# Patient Record
Sex: Male | Born: 1935 | Race: White | Hispanic: No | Marital: Married | Smoking: Never smoker
Health system: Southern US, Community
[De-identification: ages and names within clinical notes are randomized; demographics above are authoritative.]

## PROBLEM LIST (undated history)

## (undated) DIAGNOSIS — I10 Essential (primary) hypertension: Secondary | ICD-10-CM

## (undated) DIAGNOSIS — Z972 Presence of dental prosthetic device (complete) (partial): Secondary | ICD-10-CM

## (undated) DIAGNOSIS — R011 Cardiac murmur, unspecified: Secondary | ICD-10-CM

## (undated) DIAGNOSIS — I2510 Atherosclerotic heart disease of native coronary artery without angina pectoris: Secondary | ICD-10-CM

## (undated) DIAGNOSIS — E7800 Pure hypercholesterolemia, unspecified: Secondary | ICD-10-CM

## (undated) DIAGNOSIS — M109 Gout, unspecified: Secondary | ICD-10-CM

## (undated) DIAGNOSIS — H269 Unspecified cataract: Secondary | ICD-10-CM

## (undated) DIAGNOSIS — M1990 Unspecified osteoarthritis, unspecified site: Secondary | ICD-10-CM

## (undated) DIAGNOSIS — J45909 Unspecified asthma, uncomplicated: Secondary | ICD-10-CM

## (undated) DIAGNOSIS — I5020 Unspecified systolic (congestive) heart failure: Secondary | ICD-10-CM

## (undated) DIAGNOSIS — Z8619 Personal history of other infectious and parasitic diseases: Secondary | ICD-10-CM

## (undated) DIAGNOSIS — I350 Nonrheumatic aortic (valve) stenosis: Secondary | ICD-10-CM

## (undated) DIAGNOSIS — N400 Enlarged prostate without lower urinary tract symptoms: Secondary | ICD-10-CM

## (undated) HISTORY — DX: Unspecified cataract: H26.9

## (undated) HISTORY — DX: Pure hypercholesterolemia, unspecified: E78.00

## (undated) HISTORY — DX: Essential (primary) hypertension: I10

## (undated) HISTORY — DX: Unspecified systolic (congestive) heart failure: I50.20

## (undated) HISTORY — PX: DENTAL SURGERY: SHX609

## (undated) HISTORY — DX: Gout, unspecified: M10.9

## (undated) HISTORY — DX: Personal history of other infectious and parasitic diseases: Z86.19

## (undated) HISTORY — PX: CARDIAC CATHETERIZATION: SHX172

## (undated) HISTORY — DX: Unspecified asthma, uncomplicated: J45.909

## (undated) HISTORY — DX: Atherosclerotic heart disease of native coronary artery without angina pectoris: I25.10

## (undated) HISTORY — PX: FRACTURE SURGERY: SHX138

## (undated) HISTORY — DX: Benign prostatic hyperplasia without lower urinary tract symptoms: N40.0

---

## 2004-07-15 ENCOUNTER — Ambulatory Visit: Payer: Self-pay | Admitting: Unknown Physician Specialty

## 2004-09-25 ENCOUNTER — Ambulatory Visit: Payer: Self-pay | Admitting: Internal Medicine

## 2007-03-15 ENCOUNTER — Ambulatory Visit: Payer: Self-pay | Admitting: Unknown Physician Specialty

## 2010-08-26 ENCOUNTER — Ambulatory Visit: Payer: Self-pay | Admitting: Unknown Physician Specialty

## 2010-08-26 LAB — HM COLONOSCOPY: HM Colonoscopy: NORMAL

## 2012-05-02 ENCOUNTER — Ambulatory Visit (INDEPENDENT_AMBULATORY_CARE_PROVIDER_SITE_OTHER): Payer: Medicare Other | Admitting: Internal Medicine

## 2012-05-02 ENCOUNTER — Encounter: Payer: Self-pay | Admitting: Internal Medicine

## 2012-05-02 VITALS — BP 140/78 | HR 63 | Temp 97.5°F | Ht 71.0 in | Wt 239.5 lb

## 2012-05-02 DIAGNOSIS — E785 Hyperlipidemia, unspecified: Secondary | ICD-10-CM | POA: Insufficient documentation

## 2012-05-02 DIAGNOSIS — E119 Type 2 diabetes mellitus without complications: Secondary | ICD-10-CM | POA: Insufficient documentation

## 2012-05-02 DIAGNOSIS — R739 Hyperglycemia, unspecified: Secondary | ICD-10-CM

## 2012-05-02 DIAGNOSIS — M109 Gout, unspecified: Secondary | ICD-10-CM

## 2012-05-02 DIAGNOSIS — Z23 Encounter for immunization: Secondary | ICD-10-CM

## 2012-05-02 DIAGNOSIS — R7309 Other abnormal glucose: Secondary | ICD-10-CM

## 2012-05-02 DIAGNOSIS — E7800 Pure hypercholesterolemia, unspecified: Secondary | ICD-10-CM

## 2012-05-02 DIAGNOSIS — N289 Disorder of kidney and ureter, unspecified: Secondary | ICD-10-CM

## 2012-05-02 DIAGNOSIS — I10 Essential (primary) hypertension: Secondary | ICD-10-CM | POA: Insufficient documentation

## 2012-05-02 NOTE — Patient Instructions (Addendum)
It was nice seeing you today.  I am glad you have been doing well.  We will schedule your next appt with follow up labs.

## 2012-05-17 ENCOUNTER — Telehealth: Payer: Self-pay | Admitting: Internal Medicine

## 2012-05-17 MED ORDER — COLCHICINE 0.6 MG PO TABS: 0.6000 mg | ORAL_TABLET | Freq: Two times a day (BID) | ORAL | Status: DC | PRN

## 2012-05-17 NOTE — Telephone Encounter (Signed)
Patient Information:  Caller Name: Akul  Phone: [phone]  Patient: [name] [name], [name] [name]  Gender: Male  DOB: 06-Jan-1931  Age: 78 Years  PCP: Dale Colstrip   Symptoms  Reason For Call & Symptoms: gout; takes allopurinol but having a flare.  States in the past he has taken cholchicine for flares in the past, but his prescription has long expired.  Reviewed Health History In EMR: Yes  Reviewed Medications In EMR: Yes  Reviewed Allergies In EMR: Yes  Reviewed Surgeries / Procedures: Yes  Date of Onset of Symptoms: 05/04/2012  Guideline(s) Used:  Foot Pain  Disposition Per Guideline:   See Within 3 Days in Office  Reason For Disposition Reached:   Pain in the big toe joint  Advice Given:  N/A  Office Follow Up:  Does the office need to follow up with this patient?: Yes  Instructions For The Office: gout flare; info to office for staff management of appt need krs/can  RN Note:  R great toe reddened and painful; has been getting worse since flare started 10 days or so ago.  Afebrile.  States the cholchecine sometimes gives him diarrhea and wonders if there is another option for pain?  Per protocol, advised being seen within 72 hours; no available appts in specified time frame.  Info to office for provider review/Rx/callback.  Uses [facility] [facility].  May reach patient at [phone].

## 2012-05-17 NOTE — Telephone Encounter (Signed)
Spoke to pt.  Has never had problems with taking colchicine previously.  Usually does not get diarrhea.  Sent in rx for colchicine .6mg  bid prn.  Pt instructed on proper way to take.  If problems, call.

## 2012-05-21 ENCOUNTER — Encounter: Payer: Self-pay | Admitting: Internal Medicine

## 2012-05-21 DIAGNOSIS — N183 Chronic kidney disease, stage 3 unspecified: Secondary | ICD-10-CM | POA: Insufficient documentation

## 2012-05-21 DIAGNOSIS — M109 Gout, unspecified: Secondary | ICD-10-CM | POA: Insufficient documentation

## 2012-05-21 DIAGNOSIS — N289 Disorder of kidney and ureter, unspecified: Secondary | ICD-10-CM | POA: Insufficient documentation

## 2012-05-21 MED ORDER — METOPROLOL SUCCINATE ER 50 MG PO TB24: 50.0000 mg | ORAL_TABLET | Freq: Two times a day (BID) | ORAL | Status: DC

## 2012-05-21 MED ORDER — ROSUVASTATIN CALCIUM 5 MG PO TABS: 5.0000 mg | ORAL_TABLET | Freq: Every day | ORAL | Status: DC

## 2012-05-21 MED ORDER — ALLOPURINOL 100 MG PO TABS: 100.0000 mg | ORAL_TABLET | Freq: Every day | ORAL | Status: DC

## 2012-05-21 MED ORDER — HYDROCHLOROTHIAZIDE 12.5 MG PO CAPS: 12.5000 mg | ORAL_CAPSULE | Freq: Every day | ORAL | Status: DC

## 2012-05-21 MED ORDER — AMLODIPINE BESYLATE 5 MG PO TABS: 5.0000 mg | ORAL_TABLET | Freq: Every day | ORAL | Status: DC

## 2012-05-21 MED ORDER — LISINOPRIL 40 MG PO TABS: 40.0000 mg | ORAL_TABLET | Freq: Every day | ORAL | Status: DC

## 2012-05-21 NOTE — Assessment & Plan Note (Signed)
On Allopurinol.  Follow.   

## 2012-05-21 NOTE — Assessment & Plan Note (Signed)
Continue Crestor.  Low cholesterol diet and exercise.  Check lipid panel and liver function with next fasting labs.

## 2012-05-21 NOTE — Assessment & Plan Note (Signed)
Low carb diet and exercise.  Follow sugars.  Check met b and a1c.

## 2012-05-21 NOTE — Progress Notes (Signed)
Subjective:    Patient ID: [name] [name], male    DOB: 1930-11-05, 78 y.o.   MRN: [id number]  HPI 78 year old male with past history of hypertension, hypercholesterolemia, hyperglycemia, BPH and gout who comes in today for a scheduled follow up.  States he has been doing well.  No chest pain or tightness.  Breathing stable.  Saw Dr Lady Gary for the murmur.  Recommended just following.  Brought in no sugar readings.  Trying to stay active.  Bowels stable.   Past Medical History  Diagnosis Date  . Hypertension   . BPH (benign prostatic hypertrophy)   . Diabetes mellitus   . Hypercholesterolemia   . Gout   . Asthma   . History of chicken pox     Outpatient Encounter Prescriptions as of 05/02/2012  Medication Sig Dispense Refill  . allopurinol (ZYLOPRIM) 100 MG tablet Take 100 mg by mouth daily.      Marland Kitchen amLODipine (NORVASC) 5 MG tablet Take 5 mg by mouth daily.      Marland Kitchen aspirin 81 MG tablet Take 81 mg by mouth daily.      . hydrochlorothiazide (MICROZIDE) 12.5 MG capsule Take 12.5 mg by mouth daily.      Marland Kitchen lisinopril (PRINIVIL,ZESTRIL) 40 MG tablet Take 40 mg by mouth daily.      Marland Kitchen LORazepam (ATIVAN) 1 MG tablet 1 mg. Takes 1/2 to 1 tablet q day prn      . metoprolol succinate (TOPROL-XL) 50 MG 24 hr tablet Take 50 mg by mouth 2 (two) times daily.      . rosuvastatin (CRESTOR) 5 MG tablet Take 5 mg by mouth daily.      . [DISCONTINUED] hydrochlorothiazide (HYDRODIURIL) 25 MG tablet Take 25 mg by mouth daily.      . [DISCONTINUED] hydrochlorothiazide (HYDRODIURIL) 25 MG tablet Take 25 mg by mouth daily.        Review of Systems Patient denies any headache, lightheadedness or dizziness.  No sinus or allergy symptoms.   No chest pain, tightness or palpitations.  No increased shortness of breath, cough or congestion.  No nausea or vomiting.  No abdominal pain or cramping.  No bowel change, such as diarrhea, constipation, BRBPR or melana.  No urine change.  Overall he feels he is doing well.       Objective:   Physical Exam Filed Vitals:   05/02/12 0851  BP: 140/78  Pulse: 63  Temp: 97.5 F (51.42 C)   78 year old male in no acute distress.   HEENT:  Nares - clear.  OP- without lesions or erythema.  NECK:  Supple, nontender.  No audible bruit.   HEART:  Appears to be regular. LUNGS:  Without crackles or wheezing audible.  Respirations even and unlabored.   RADIAL PULSE:  Equal bilaterally.  ABDOMEN:  Soft, nontender.  No audible abdominal bruit.   EXTREMITIES:  No increased edema to be present.                     Assessment & Plan:  INCREASED PSYCHOSOCIAL STRESSORS.  Takes an occasional Lorazepam.  Currently feels he is doing well.  Follow.   HISTORY OF ELEVATED PSA.  Saw Dr Achilles Dunk.  PSA 08/02/11 - 3.90 (stable).  Follow.    CARDIOVASCULAR.  Saw Dr Lady Gary.  Recommended to continue to follow.  Need Dr America Brown last note and last ECHO.    PULMONARY.  Asymptomatic.    HEALTH MAINTENANCE.  Physical 07/15/11.  PSA  as outlined.  Previous colonoscopy revealed multiple adenomatous polyps.  Follow up colonoscopy 08/26/10 revealed internal hemorrhoids otherwise negative.  Per GI, no follow up colonoscopy needed.  Pneumovax 07/31/08.

## 2012-05-21 NOTE — Assessment & Plan Note (Signed)
Creatinine has been stable 1.2-1.3.  Follow.  Continue lisinopril.    

## 2012-05-21 NOTE — Assessment & Plan Note (Signed)
Blood pressure as outlined.  Same meds.  Check metabolic panel.  

## 2012-07-28 ENCOUNTER — Telehealth: Payer: Self-pay | Admitting: Internal Medicine

## 2012-07-28 ENCOUNTER — Other Ambulatory Visit (INDEPENDENT_AMBULATORY_CARE_PROVIDER_SITE_OTHER): Payer: Medicare Other

## 2012-07-28 DIAGNOSIS — I10 Essential (primary) hypertension: Secondary | ICD-10-CM

## 2012-07-28 DIAGNOSIS — R739 Hyperglycemia, unspecified: Secondary | ICD-10-CM

## 2012-07-28 DIAGNOSIS — R7309 Other abnormal glucose: Secondary | ICD-10-CM

## 2012-07-28 DIAGNOSIS — E7800 Pure hypercholesterolemia, unspecified: Secondary | ICD-10-CM

## 2012-07-28 LAB — LIPID PANEL
Cholesterol: 119 mg/dL (ref 0–200)
LDL Cholesterol: 71 mg/dL (ref 0–99)
Triglycerides: 71 mg/dL (ref 0.0–149.0)
VLDL: 14.2 mg/dL (ref 0.0–40.0)

## 2012-07-28 LAB — HEPATIC FUNCTION PANEL
ALT: 23 U/L (ref 0–53)
Albumin: 4.5 g/dL (ref 3.5–5.2)
Alkaline Phosphatase: 53 U/L (ref 39–117)
Total Protein: 7.3 g/dL (ref 6.0–8.3)

## 2012-07-28 LAB — BASIC METABOLIC PANEL
Calcium: 9.8 mg/dL (ref 8.4–10.5)
GFR: 60.18 mL/min (ref >60.00)
Potassium: 4.3 mEq/L (ref 3.5–5.1)
Sodium: 138 mEq/L (ref 135–145)

## 2012-07-28 LAB — HEMOGLOBIN A1C: Hgb A1c MFr Bld: 6.2 % (ref 4.6–6.5)

## 2012-07-28 NOTE — Telephone Encounter (Signed)
Pt notified of lab results via mychart. 

## 2012-08-04 ENCOUNTER — Encounter: Payer: Medicare Other | Admitting: Internal Medicine

## 2012-08-29 LAB — HM DIABETES EYE EXAM

## 2012-09-29 ENCOUNTER — Encounter: Payer: Self-pay | Admitting: Internal Medicine

## 2012-09-29 ENCOUNTER — Ambulatory Visit (INDEPENDENT_AMBULATORY_CARE_PROVIDER_SITE_OTHER): Payer: Medicare Other | Admitting: Internal Medicine

## 2012-09-29 VITALS — BP 132/72 | HR 60 | Temp 97.6°F | Resp 18 | Ht 71.0 in | Wt 243.5 lb

## 2012-09-29 DIAGNOSIS — E7800 Pure hypercholesterolemia, unspecified: Secondary | ICD-10-CM

## 2012-09-29 DIAGNOSIS — N289 Disorder of kidney and ureter, unspecified: Secondary | ICD-10-CM

## 2012-09-29 DIAGNOSIS — I10 Essential (primary) hypertension: Secondary | ICD-10-CM

## 2012-09-29 DIAGNOSIS — R739 Hyperglycemia, unspecified: Secondary | ICD-10-CM

## 2012-09-29 DIAGNOSIS — M109 Gout, unspecified: Secondary | ICD-10-CM

## 2012-09-29 DIAGNOSIS — R7309 Other abnormal glucose: Secondary | ICD-10-CM

## 2012-09-29 MED ORDER — LORAZEPAM 1 MG PO TABS: ORAL_TABLET | ORAL | Status: DC

## 2012-10-01 ENCOUNTER — Encounter: Payer: Self-pay | Admitting: Internal Medicine

## 2012-10-01 NOTE — Assessment & Plan Note (Signed)
Creatinine has been stable 1.2-1.3.  Follow.  Continue lisinopril.    

## 2012-10-01 NOTE — Assessment & Plan Note (Signed)
On Allopurinol.  Follow.   

## 2012-10-01 NOTE — Assessment & Plan Note (Signed)
Continue Crestor.  Low cholesterol diet and exercise.  Check lipid panel and liver function with next fasting labs.  Last lipid panel 07/28/12 revealed total cholesterol 119, triglycerides 71, HDL 34 and LDL 71.

## 2012-10-01 NOTE — Assessment & Plan Note (Signed)
Low carb diet and exercise.  Follow sugars.  Check met b and a1c with next fasting labs.

## 2012-10-01 NOTE — Assessment & Plan Note (Signed)
Blood pressure as outlined.  Same meds.  Check metabolic panel with next labs.

## 2012-10-01 NOTE — Progress Notes (Signed)
Subjective:    Patient ID: [name] [name], male    DOB: [birth year], 79 y.o.   MRN: [id number]  HPI 79 year old male with past history of hypertension, hypercholesterolemia, hyperglycemia, BPH and gout who comes in today to follow up on these issues as well as for a complete physical exam. States he has been doing well.  No chest pain or tightness.  Breathing stable.  Saw Dr Lady Gary for the murmur.  Recommended just following.  Brought in no sugar readings.  Trying to stay active.  Bowels stable.  Plans to be more active now that it is warmer weather.  Overall he feels good.    Past Medical History  Diagnosis Date  . Hypertension   . BPH (benign prostatic hypertrophy)   . Diabetes mellitus   . Hypercholesterolemia   . Gout   . Asthma   . History of chicken pox     Outpatient Encounter Prescriptions as of 09/29/2012  Medication Sig Dispense Refill  . allopurinol (ZYLOPRIM) 100 MG tablet Take 1 tablet (100 mg total) by mouth daily.  90 tablet  3  . amLODipine (NORVASC) 5 MG tablet Take 1 tablet (5 mg total) by mouth daily.  90 tablet  3  . aspirin 81 MG tablet Take 81 mg by mouth daily.      . colchicine 0.6 MG tablet Take 1 tablet (0.6 mg total) by mouth 2 (two) times daily as needed.  30 tablet  0  . hydrochlorothiazide (MICROZIDE) 12.5 MG capsule Take 1 capsule (12.5 mg total) by mouth daily.  90 capsule  3  . lisinopril (PRINIVIL,ZESTRIL) 40 MG tablet Take 1 tablet (40 mg total) by mouth daily.  90 tablet  3  . LORazepam (ATIVAN) 1 MG tablet Takes 1/2 to 1 tablet q day prn  30 tablet  1  . metoprolol succinate (TOPROL-XL) 50 MG 24 hr tablet Take 1 tablet (50 mg total) by mouth 2 (two) times daily.  180 tablet  3  . rosuvastatin (CRESTOR) 5 MG tablet Take 1 tablet (5 mg total) by mouth daily.  90 tablet  3  . [DISCONTINUED] LORazepam (ATIVAN) 1 MG tablet 1 mg. Takes 1/2 to 1 tablet q day prn       No facility-administered encounter medications on file as of 09/29/2012.    Review of  Systems Patient denies any headache, lightheadedness or dizziness.  No sinus or allergy symptoms.   No chest pain, tightness or palpitations.  No increased shortness of breath, cough or congestion.  No nausea or vomiting.  No acid reflux.  No abdominal pain or cramping.  No bowel change, such as diarrhea, constipation, BRBPR or melana.  No urine change.  Overall he feels he is doing well.       Objective:   Physical Exam  Filed Vitals:   09/29/12 0944  BP: 132/72  Pulse: 60  Temp: 97.6 F (36.4 C)  Resp: 31   79 year old male in no acute distress.  HEENT:  Nares - clear.  Oropharynx - without lesions. NECK:  Supple.  Nontender.  No audible carotid bruit.  HEART:  Appears to be regular.   LUNGS:  No crackles or wheezing audible.  Respirations even and unlabored.   RADIAL PULSE:  Equal bilaterally.  ABDOMEN:  Soft.  Nontender.  Bowel sounds present and normal.  No audible abdominal bruit.  GU:  He declined.    RECTAL:  He declined.    EXTREMITIES:  No increased edema present.  DP pulses palpable and equal bilaterally.     SKIN:  No lesions.  No rash.       Assessment & Plan:  INCREASED PSYCHOSOCIAL STRESSORS.  Takes an occasional Lorazepam.  Currently feels he is doing well.  Follow.   HISTORY OF ELEVATED PSA.  Saw Dr Achilles Dunk.  PSA 08/02/11 - 3.90 (stable).  Discussed follow up PSA check.  He declines.  States he desires no further testing.    CARDIOVASCULAR.  Saw Dr Lady Gary.  Recommended to continue to follow.  Currently symptoms stable.    PULMONARY.  Asymptomatic.    HEALTH MAINTENANCE.  Physical today.  PSA as outlined.  Declines further PSA checks.  Previous colonoscopy revealed multiple adenomatous polyps.  Follow up colonoscopy 08/26/10 revealed internal hemorrhoids otherwise negative.  Per GI, no follow up colonoscopy needed.  He declines further hemoccult testing.  Pneumovax 07/31/08.

## 2012-12-25 ENCOUNTER — Other Ambulatory Visit (INDEPENDENT_AMBULATORY_CARE_PROVIDER_SITE_OTHER): Payer: Medicare Other

## 2012-12-25 DIAGNOSIS — R7309 Other abnormal glucose: Secondary | ICD-10-CM

## 2012-12-25 DIAGNOSIS — E7800 Pure hypercholesterolemia, unspecified: Secondary | ICD-10-CM

## 2012-12-25 DIAGNOSIS — R739 Hyperglycemia, unspecified: Secondary | ICD-10-CM

## 2012-12-25 DIAGNOSIS — N289 Disorder of kidney and ureter, unspecified: Secondary | ICD-10-CM

## 2012-12-25 DIAGNOSIS — I10 Essential (primary) hypertension: Secondary | ICD-10-CM

## 2012-12-25 LAB — CBC WITH DIFFERENTIAL/PLATELET
Basophils Relative: 0.9 % (ref 0.0–3.0)
Eosinophils Relative: 5.7 % — ABNORMAL HIGH (ref 0.0–5.0)
Hemoglobin: 15.1 g/dL (ref 13.0–17.0)
Lymphocytes Relative: 45.5 % (ref 12.0–46.0)
Monocytes Relative: 8.1 % (ref 3.0–12.0)
Neutro Abs: 3.5 10*3/uL (ref 1.4–7.7)
RBC: 4.54 Mil/uL (ref 4.22–5.81)
WBC: 8.8 10*3/uL (ref 4.5–10.5)

## 2012-12-25 LAB — BASIC METABOLIC PANEL
Calcium: 9.6 mg/dL (ref 8.4–10.5)
Chloride: 106 mEq/L (ref 96–112)
GFR: 55.41 mL/min — ABNORMAL LOW (ref >60.00)
Glucose, Bld: 127 mg/dL — ABNORMAL HIGH (ref 70–99)
Potassium: 4 mEq/L (ref 3.5–5.1)

## 2012-12-25 LAB — HEPATIC FUNCTION PANEL
ALT: 22 U/L (ref 0–53)
AST: 26 U/L (ref 0–37)
Total Bilirubin: 0.7 mg/dL (ref 0.3–1.2)
Total Protein: 7 g/dL (ref 6.0–8.3)

## 2012-12-25 LAB — TSH: TSH: 3.16 u[IU]/mL (ref 0.35–5.50)

## 2012-12-25 LAB — MICROALBUMIN / CREATININE URINE RATIO: Microalb Creat Ratio: 4.1 mg/g (ref 0.0–30.0)

## 2012-12-25 LAB — LIPID PANEL
Cholesterol: 115 mg/dL (ref 0–200)
HDL: 33.1 mg/dL — ABNORMAL LOW (ref >39.00)

## 2012-12-26 ENCOUNTER — Encounter: Payer: Self-pay | Admitting: Internal Medicine

## 2012-12-29 ENCOUNTER — Encounter: Payer: Self-pay | Admitting: Internal Medicine

## 2012-12-29 ENCOUNTER — Ambulatory Visit (INDEPENDENT_AMBULATORY_CARE_PROVIDER_SITE_OTHER): Payer: Medicare Other | Admitting: Internal Medicine

## 2012-12-29 VITALS — BP 110/70 | HR 64 | Temp 97.9°F | Ht 71.0 in | Wt 240.5 lb

## 2012-12-29 DIAGNOSIS — E7800 Pure hypercholesterolemia, unspecified: Secondary | ICD-10-CM

## 2012-12-29 DIAGNOSIS — N289 Disorder of kidney and ureter, unspecified: Secondary | ICD-10-CM

## 2012-12-29 DIAGNOSIS — R739 Hyperglycemia, unspecified: Secondary | ICD-10-CM

## 2012-12-29 DIAGNOSIS — M109 Gout, unspecified: Secondary | ICD-10-CM

## 2012-12-29 DIAGNOSIS — I10 Essential (primary) hypertension: Secondary | ICD-10-CM

## 2012-12-29 DIAGNOSIS — R7309 Other abnormal glucose: Secondary | ICD-10-CM

## 2012-12-31 ENCOUNTER — Encounter: Payer: Self-pay | Admitting: Internal Medicine

## 2012-12-31 NOTE — Assessment & Plan Note (Signed)
Low carb diet and exercise.  Follow sugars.  Follow met b and a1c.  Last a1c 12/25/12 - 6.6.

## 2012-12-31 NOTE — Progress Notes (Signed)
Subjective:    Patient ID: [name] [name], male    DOB: 1931-01-17, 79 y.o.   MRN: [id number]  HPI 79 year old male with past history of hypertension, hypercholesterolemia, hyperglycemia, BPH and gout who comes in today for a scheduled follow up.  States he has been doing well.  No chest pain or tightness.  Breathing stable.  Saw Dr Lady Gary for the murmur.  Recommended just following.  Brought in no sugar readings.  Trying to stay active.  Bowels stable.  Overall he feels good and feels he is doing well.      Past Medical History  Diagnosis Date  . Hypertension   . BPH (benign prostatic hypertrophy)   . Diabetes mellitus   . Hypercholesterolemia   . Gout   . Asthma   . History of chicken pox     Outpatient Encounter Prescriptions as of 12/29/2012  Medication Sig Dispense Refill  . allopurinol (ZYLOPRIM) 100 MG tablet Take 1 tablet (100 mg total) by mouth daily.  90 tablet  3  . amLODipine (NORVASC) 5 MG tablet Take 1 tablet (5 mg total) by mouth daily.  90 tablet  3  . aspirin 81 MG tablet Take 81 mg by mouth daily.      . colchicine 0.6 MG tablet Take 1 tablet (0.6 mg total) by mouth 2 (two) times daily as needed.  30 tablet  0  . hydrochlorothiazide (MICROZIDE) 12.5 MG capsule Take 1 capsule (12.5 mg total) by mouth daily.  90 capsule  3  . lisinopril (PRINIVIL,ZESTRIL) 40 MG tablet Take 1 tablet (40 mg total) by mouth daily.  90 tablet  3  . LORazepam (ATIVAN) 1 MG tablet Takes 1/2 to 1 tablet q day prn  30 tablet  1  . metoprolol succinate (TOPROL-XL) 50 MG 24 hr tablet Take 1 tablet (50 mg total) by mouth 2 (two) times daily.  180 tablet  3  . rosuvastatin (CRESTOR) 5 MG tablet Take 1 tablet (5 mg total) by mouth daily.  90 tablet  3   No facility-administered encounter medications on file as of 12/29/2012.    Review of Systems Patient denies any headache, lightheadedness or dizziness.  No sinus or allergy symptoms.   No chest pain, tightness or palpitations.  No increased  shortness of breath, cough or congestion.  No nausea or vomiting.  No acid reflux.  No abdominal pain or cramping.  No bowel change, such as diarrhea, constipation, BRBPR or melana.  No urine change.  Overall he feels he is doing well.       Objective:   Physical Exam  Filed Vitals:   12/29/12 0814  BP: 110/70  Pulse: 64  Temp: 97.9 F (19.41 C)   79 year old male in no acute distress.  HEENT:  Nares - clear.  Oropharynx - without lesions. NECK:  Supple.  Nontender.  No audible carotid bruit.  HEART:  Appears to be regular.   LUNGS:  No crackles or wheezing audible.  Respirations even and unlabored.   RADIAL PULSE:  Equal bilaterally.  ABDOMEN:  Soft.  Nontender.  Bowel sounds present and normal.  No audible abdominal bruit.     EXTREMITIES:  No increased edema present.  DP pulses palpable and equal bilaterally.        Assessment & Plan:  INCREASED PSYCHOSOCIAL STRESSORS.  Takes an occasional Lorazepam.  Currently feels he is doing well.  Follow.   HISTORY OF ELEVATED PSA.  Saw Dr Achilles Dunk.  PSA 08/02/11 -  3.90 (stable).  Discussed follow up PSA check.  He declines.  States he desires no further testing.    CARDIOVASCULAR.  Saw Dr Lady Gary.  Recommended to continue to follow.  Currently symptoms stable.  Very active with no cardiac symptoms.    PULMONARY.  Asymptomatic.    HEALTH MAINTENANCE.  Physical 09/29/12.  PSA as outlined.  Declines further PSA checks.  Previous colonoscopy revealed multiple adenomatous polyps.  Follow up colonoscopy 08/26/10 revealed internal hemorrhoids otherwise negative.  Per GI, no follow up colonoscopy needed.  He declines further hemoccult testing.

## 2012-12-31 NOTE — Assessment & Plan Note (Signed)
Continue Crestor.  Low cholesterol diet and exercise.  Check lipid panel and liver function with next fasting labs.  Last lipid panel 12/25/12 revealed total cholesterol 115, triglycerides 66, HDL 33 and LDL 69.

## 2012-12-31 NOTE — Assessment & Plan Note (Signed)
Creatinine has been stable 1.2-1.3.  Follow.  Continue lisinopril.    

## 2012-12-31 NOTE — Assessment & Plan Note (Signed)
On Allopurinol.  Follow.   

## 2012-12-31 NOTE — Assessment & Plan Note (Signed)
Blood pressure as outlined.  Same meds.  Follow metabolic panel.   

## 2013-04-24 ENCOUNTER — Other Ambulatory Visit (INDEPENDENT_AMBULATORY_CARE_PROVIDER_SITE_OTHER): Payer: Medicare Other

## 2013-04-24 DIAGNOSIS — R7309 Other abnormal glucose: Secondary | ICD-10-CM

## 2013-04-24 DIAGNOSIS — R739 Hyperglycemia, unspecified: Secondary | ICD-10-CM

## 2013-04-24 DIAGNOSIS — E7800 Pure hypercholesterolemia, unspecified: Secondary | ICD-10-CM

## 2013-04-24 DIAGNOSIS — I10 Essential (primary) hypertension: Secondary | ICD-10-CM

## 2013-04-24 LAB — HEPATIC FUNCTION PANEL
ALT: 19 U/L (ref 0–53)
Albumin: 4.3 g/dL (ref 3.5–5.2)
Alkaline Phosphatase: 46 U/L (ref 39–117)
Bilirubin, Direct: 0.1 mg/dL (ref 0.0–0.3)
Total Bilirubin: 0.9 mg/dL (ref 0.3–1.2)
Total Protein: 7 g/dL (ref 6.0–8.3)

## 2013-04-24 LAB — LIPID PANEL
Cholesterol: 113 mg/dL (ref 0–200)
HDL: 35.2 mg/dL — ABNORMAL LOW (ref >39.00)
LDL Cholesterol: 65 mg/dL (ref 0–99)
Total CHOL/HDL Ratio: 3
Triglycerides: 63 mg/dL (ref 0.0–149.0)

## 2013-04-24 LAB — BASIC METABOLIC PANEL
BUN: 14 mg/dL (ref 6–23)
CO2: 30 mEq/L (ref 19–32)
Calcium: 9.6 mg/dL (ref 8.4–10.5)
Glucose, Bld: 111 mg/dL — ABNORMAL HIGH (ref 70–99)
Potassium: 4.3 mEq/L (ref 3.5–5.1)
Sodium: 138 mEq/L (ref 135–145)

## 2013-04-24 LAB — HEMOGLOBIN A1C: Hgb A1c MFr Bld: 6.5 % (ref 4.6–6.5)

## 2013-04-25 ENCOUNTER — Encounter: Payer: Self-pay | Admitting: Internal Medicine

## 2013-04-26 ENCOUNTER — Other Ambulatory Visit: Payer: Self-pay

## 2013-05-01 ENCOUNTER — Ambulatory Visit (INDEPENDENT_AMBULATORY_CARE_PROVIDER_SITE_OTHER): Payer: Medicare Other | Admitting: Internal Medicine

## 2013-05-01 ENCOUNTER — Encounter: Payer: Self-pay | Admitting: Internal Medicine

## 2013-05-01 VITALS — BP 120/80 | HR 62 | Temp 97.7°F | Ht 71.0 in | Wt 243.0 lb

## 2013-05-01 DIAGNOSIS — E119 Type 2 diabetes mellitus without complications: Secondary | ICD-10-CM

## 2013-05-01 DIAGNOSIS — I10 Essential (primary) hypertension: Secondary | ICD-10-CM

## 2013-05-01 DIAGNOSIS — Z23 Encounter for immunization: Secondary | ICD-10-CM

## 2013-05-01 DIAGNOSIS — N289 Disorder of kidney and ureter, unspecified: Secondary | ICD-10-CM

## 2013-05-01 DIAGNOSIS — M109 Gout, unspecified: Secondary | ICD-10-CM

## 2013-05-01 DIAGNOSIS — E7800 Pure hypercholesterolemia, unspecified: Secondary | ICD-10-CM

## 2013-05-01 MED ORDER — LORAZEPAM 1 MG PO TABS: ORAL_TABLET | ORAL | Status: DC

## 2013-05-01 NOTE — Assessment & Plan Note (Addendum)
A1c 6.5 (04/24/13).  Up to date with eye checks.  Low carb diet and exercise.  Follow.

## 2013-05-01 NOTE — Assessment & Plan Note (Addendum)
Creatinine has been stable 1.2-1.3.  Follow.  Continue lisinopril.  Last check 04/24/13 - 1.3.

## 2013-05-01 NOTE — Progress Notes (Signed)
Subjective:    Patient ID: [name] [name], male    DOB: October 14, 1930, 79 y.o.   MRN: [id number]  HPI 79 year old male with past history of hypertension, hypercholesterolemia, hyperglycemia, BPH and gout who comes in today for a scheduled follow up.  States he has been doing well.  No chest pain or tightness.  Breathing stable.  Saw Dr Lady Gary for the murmur.  Recommended just following.  Brought in no sugar readings.  Trying to stay active.  Bowels stable.  Overall he feels good and feels he is doing well.      Past Medical History  Diagnosis Date  . Hypertension   . BPH (benign prostatic hypertrophy)   . Diabetes mellitus   . Hypercholesterolemia   . Gout   . Asthma   . History of chicken pox     Outpatient Encounter Prescriptions as of 05/01/2013  Medication Sig  . allopurinol (ZYLOPRIM) 100 MG tablet Take 1 tablet (100 mg total) by mouth daily.  Marland Kitchen amLODipine (NORVASC) 5 MG tablet Take 1 tablet (5 mg total) by mouth daily.  Marland Kitchen aspirin 81 MG tablet Take 81 mg by mouth daily.  . hydrochlorothiazide (MICROZIDE) 12.5 MG capsule Take 1 capsule (12.5 mg total) by mouth daily.  Marland Kitchen lisinopril (PRINIVIL,ZESTRIL) 40 MG tablet Take 1 tablet (40 mg total) by mouth daily.  Marland Kitchen LORazepam (ATIVAN) 1 MG tablet Takes 1/2 to 1 tablet q day prn  . metoprolol succinate (TOPROL-XL) 50 MG 24 hr tablet Take 1 tablet (50 mg total) by mouth 2 (two) times daily.  . rosuvastatin (CRESTOR) 5 MG tablet Take 1 tablet (5 mg total) by mouth daily.  . [DISCONTINUED] colchicine 0.6 MG tablet Take 1 tablet (0.6 mg total) by mouth 2 (two) times daily as needed.    Review of Systems Patient denies any headache, lightheadedness or dizziness.  No sinus or allergy symptoms.   No chest pain, tightness or palpitations.  No increased shortness of breath, cough or congestion.  No nausea or vomiting.  No acid reflux.  No abdominal pain or cramping.  No bowel change, such as diarrhea, constipation, BRBPR or melana.  No urine change.   Overall he feels he is doing well.       Objective:   Physical Exam  Filed Vitals:   05/01/13 0808  BP: 120/80  Pulse: 62  Temp: 97.7 F (18.59 C)   79 year old male in no acute distress.  HEENT:  Nares - clear.  Oropharynx - without lesions. NECK:  Supple.  Nontender.  No audible carotid bruit.  HEART:  Appears to be regular.   LUNGS:  No crackles or wheezing audible.  Respirations even and unlabored.   RADIAL PULSE:  Equal bilaterally.  ABDOMEN:  Soft.  Nontender.  Bowel sounds present and normal.  No audible abdominal bruit.     EXTREMITIES:  No increased edema present.  DP pulses palpable and equal bilaterally.  FEET:  No lesions.        Assessment & Plan:  INCREASED PSYCHOSOCIAL STRESSORS.  Takes an occasional Lorazepam.  Currently feels he is doing well.  Follow.   HISTORY OF ELEVATED PSA.  Saw Dr Achilles Dunk.  PSA 08/02/11 - 3.90 (stable).  Discussed follow up PSA check.  He declines.  States he desires no further testing.    CARDIOVASCULAR.  Saw Dr Lady Gary.  Recommended to continue to follow.  Currently symptoms stable.  Very active with no cardiac symptoms.    PULMONARY.  Asymptomatic.  HEALTH MAINTENANCE.  Physical 09/29/12.  PSA as outlined.  Declines further PSA checks.  Previous colonoscopy revealed multiple adenomatous polyps.  Follow up colonoscopy 08/26/10 revealed internal hemorrhoids otherwise negative.  Per GI, no follow up colonoscopy needed.  He declines further hemoccult testing.

## 2013-05-01 NOTE — Assessment & Plan Note (Signed)
On Allopurinol.  Follow.   

## 2013-05-01 NOTE — Assessment & Plan Note (Signed)
Blood pressure as outlined.  Same meds.  Follow metabolic panel.   

## 2013-05-01 NOTE — Assessment & Plan Note (Addendum)
Continue Crestor.  Low cholesterol diet and exercise.  Follow lipid panel and liver function.   Last lipid panel 04/24/13 revealed total cholesterol 113, triglycerides 63, HDL 53 and LDL 65.

## 2013-05-05 ENCOUNTER — Encounter: Payer: Self-pay | Admitting: Internal Medicine

## 2013-07-18 ENCOUNTER — Encounter: Payer: Self-pay | Admitting: *Deleted

## 2013-07-18 ENCOUNTER — Other Ambulatory Visit: Payer: Self-pay | Admitting: *Deleted

## 2013-07-18 MED ORDER — ALLOPURINOL 100 MG PO TABS: 100.0000 mg | ORAL_TABLET | Freq: Every day | ORAL | Status: DC

## 2013-07-18 MED ORDER — AMLODIPINE BESYLATE 5 MG PO TABS: 5.0000 mg | ORAL_TABLET | Freq: Every day | ORAL | Status: DC

## 2013-07-18 MED ORDER — LISINOPRIL 40 MG PO TABS: 40.0000 mg | ORAL_TABLET | Freq: Every day | ORAL | Status: DC

## 2013-07-18 MED ORDER — ROSUVASTATIN CALCIUM 5 MG PO TABS: 5.0000 mg | ORAL_TABLET | Freq: Every day | ORAL | Status: DC

## 2013-07-18 MED ORDER — METOPROLOL SUCCINATE ER 50 MG PO TB24: 50.0000 mg | ORAL_TABLET | Freq: Two times a day (BID) | ORAL | Status: DC

## 2013-07-18 MED ORDER — HYDROCHLOROTHIAZIDE 12.5 MG PO CAPS: 12.5000 mg | ORAL_CAPSULE | Freq: Every day | ORAL | Status: DC

## 2013-09-19 ENCOUNTER — Telehealth: Payer: Self-pay | Admitting: Internal Medicine

## 2013-09-19 NOTE — Telephone Encounter (Signed)
Left vm.  Asking for a call regarding a referral.  No further info given.

## 2013-09-20 NOTE — Telephone Encounter (Signed)
Pt left vm again.  States this is second call.  Would like a call regarding referral to Dermatology due to [facility]

## 2013-09-24 NOTE — Telephone Encounter (Signed)
Pt calling for status of referral to [facility].  [employer] form has been completed and placed on Amber's desk.  Pt advised his referral will be complete before his appt which is scheduled 4/13.

## 2013-09-25 NOTE — Telephone Encounter (Signed)
Referral underway to [facility]

## 2013-09-27 ENCOUNTER — Telehealth: Payer: Self-pay | Admitting: Internal Medicine

## 2013-09-27 NOTE — Telephone Encounter (Signed)
Referral pending per [facility]

## 2013-09-27 NOTE — Telephone Encounter (Signed)
Error

## 2013-09-27 NOTE — Telephone Encounter (Signed)
Pt called to check status of referral.  Advised pt referral has been sent.  Patient is frustrated with the process.  Advised pt I will continue to check on this and I will call him with any information received regarding the referral.

## 2013-09-28 NOTE — Telephone Encounter (Signed)
Called Mr. Sjogren regarding status of referral.  Spoke with Inez Catalina, spouse.  States pt spoke with [facility] today and the referral was received.  Pt will keep appt 4/13 at [facility].

## 2013-10-03 ENCOUNTER — Other Ambulatory Visit (INDEPENDENT_AMBULATORY_CARE_PROVIDER_SITE_OTHER): Payer: Medicare HMO

## 2013-10-03 ENCOUNTER — Encounter: Payer: Self-pay | Admitting: Internal Medicine

## 2013-10-03 DIAGNOSIS — I10 Essential (primary) hypertension: Secondary | ICD-10-CM

## 2013-10-03 DIAGNOSIS — E7800 Pure hypercholesterolemia, unspecified: Secondary | ICD-10-CM

## 2013-10-03 DIAGNOSIS — E119 Type 2 diabetes mellitus without complications: Secondary | ICD-10-CM

## 2013-10-03 LAB — LIPID PANEL
CHOL/HDL RATIO: 3
Cholesterol: 108 mg/dL (ref 0–200)
HDL: 31.3 mg/dL — AB (ref >39.00)
LDL CALC: 63 mg/dL (ref 0–99)
Triglycerides: 68 mg/dL (ref 0.0–149.0)
VLDL: 13.6 mg/dL (ref 0.0–40.0)

## 2013-10-03 LAB — BASIC METABOLIC PANEL
BUN: 18 mg/dL (ref 6–23)
CALCIUM: 9.8 mg/dL (ref 8.4–10.5)
CO2: 30 mEq/L (ref 19–32)
Chloride: 103 mEq/L (ref 96–112)
Creatinine, Ser: 1.2 mg/dL (ref 0.4–1.5)
GFR: 64.2 mL/min (ref >60.00)
Glucose, Bld: 116 mg/dL — ABNORMAL HIGH (ref 70–99)
Potassium: 4.2 mEq/L (ref 3.5–5.1)
Sodium: 139 mEq/L (ref 135–145)

## 2013-10-03 LAB — HEPATIC FUNCTION PANEL
ALBUMIN: 4.2 g/dL (ref 3.5–5.2)
ALT: 22 U/L (ref 0–53)
AST: 29 U/L (ref 0–37)
Alkaline Phosphatase: 57 U/L (ref 39–117)
BILIRUBIN TOTAL: 0.7 mg/dL (ref 0.3–1.2)
Bilirubin, Direct: 0.1 mg/dL (ref 0.0–0.3)
Total Protein: 7.1 g/dL (ref 6.0–8.3)

## 2013-10-03 LAB — HEMOGLOBIN A1C: Hgb A1c MFr Bld: 6.3 % (ref 4.6–6.5)

## 2013-10-05 ENCOUNTER — Encounter: Payer: Self-pay | Admitting: Internal Medicine

## 2013-10-05 ENCOUNTER — Encounter: Payer: Self-pay | Admitting: *Deleted

## 2013-10-05 ENCOUNTER — Ambulatory Visit (INDEPENDENT_AMBULATORY_CARE_PROVIDER_SITE_OTHER): Payer: Medicare HMO | Admitting: Internal Medicine

## 2013-10-05 VITALS — BP 130/80 | HR 66 | Temp 97.6°F | Ht 70.5 in | Wt 241.8 lb

## 2013-10-05 DIAGNOSIS — Z125 Encounter for screening for malignant neoplasm of prostate: Secondary | ICD-10-CM

## 2013-10-05 DIAGNOSIS — I10 Essential (primary) hypertension: Secondary | ICD-10-CM

## 2013-10-05 DIAGNOSIS — E119 Type 2 diabetes mellitus without complications: Secondary | ICD-10-CM

## 2013-10-05 DIAGNOSIS — N289 Disorder of kidney and ureter, unspecified: Secondary | ICD-10-CM

## 2013-10-05 DIAGNOSIS — E7800 Pure hypercholesterolemia, unspecified: Secondary | ICD-10-CM

## 2013-10-05 DIAGNOSIS — M109 Gout, unspecified: Secondary | ICD-10-CM

## 2013-10-05 DIAGNOSIS — Z23 Encounter for immunization: Secondary | ICD-10-CM

## 2013-10-05 MED ORDER — LORAZEPAM 1 MG PO TABS: ORAL_TABLET | ORAL | Status: DC

## 2013-10-05 MED ORDER — LISINOPRIL 40 MG PO TABS: 40.0000 mg | ORAL_TABLET | Freq: Every day | ORAL | Status: DC

## 2013-10-05 MED ORDER — HYDROCHLOROTHIAZIDE 12.5 MG PO CAPS: 12.5000 mg | ORAL_CAPSULE | Freq: Every day | ORAL | Status: DC

## 2013-10-05 MED ORDER — AMLODIPINE BESYLATE 5 MG PO TABS: 5.0000 mg | ORAL_TABLET | Freq: Every day | ORAL | Status: DC

## 2013-10-05 MED ORDER — ROSUVASTATIN CALCIUM 5 MG PO TABS: 5.0000 mg | ORAL_TABLET | Freq: Every day | ORAL | Status: DC

## 2013-10-05 MED ORDER — ALLOPURINOL 100 MG PO TABS: 100.0000 mg | ORAL_TABLET | Freq: Every day | ORAL | Status: DC

## 2013-10-05 MED ORDER — METOPROLOL SUCCINATE ER 50 MG PO TB24: 50.0000 mg | ORAL_TABLET | Freq: Two times a day (BID) | ORAL | Status: DC

## 2013-10-05 NOTE — Assessment & Plan Note (Signed)
On Allopurinol.  Follow.   

## 2013-10-05 NOTE — Assessment & Plan Note (Signed)
A1c 6.3 (10/05/13).  Up to date with eye checks.  Low carb diet and exercise.  Follow.

## 2013-10-05 NOTE — Progress Notes (Signed)
Subjective:    Patient ID: [name] [name], male    DOB: 02-19-1931, 80 y.o.   MRN: [id number]  HPI 80 year old male with past history of hypertension, hypercholesterolemia, hyperglycemia, BPH and gout who comes in today to follow up on these issues as well as for a complete physical exam.   States he has been doing well.  No chest pain or tightness.  Breathing stable.  Sees Dr Ubaldo Glassing for the murmur.  Recommended just following.  Brought in no sugar readings.  A1c better.  Trying to stay active.  Bowels stable.  Overall he feels good and feels he is doing well.      Past Medical History  Diagnosis Date  . Hypertension   . BPH (benign prostatic hypertrophy)   . Diabetes mellitus   . Hypercholesterolemia   . Gout   . Asthma   . History of chicken pox     Outpatient Encounter Prescriptions as of 10/05/2013  Medication Sig  . allopurinol (ZYLOPRIM) 100 MG tablet Take 1 tablet (100 mg total) by mouth daily.  Marland Kitchen amLODipine (NORVASC) 5 MG tablet Take 1 tablet (5 mg total) by mouth daily.  Marland Kitchen aspirin 81 MG tablet Take 81 mg by mouth daily.  . hydrochlorothiazide (MICROZIDE) 12.5 MG capsule Take 1 capsule (12.5 mg total) by mouth daily.  Marland Kitchen lisinopril (PRINIVIL,ZESTRIL) 40 MG tablet Take 1 tablet (40 mg total) by mouth daily.  Marland Kitchen LORazepam (ATIVAN) 1 MG tablet Takes 1/2 to 1 tablet q day prn  . metoprolol succinate (TOPROL-XL) 50 MG 24 hr tablet Take 1 tablet (50 mg total) by mouth 2 (two) times daily.  . rosuvastatin (CRESTOR) 5 MG tablet Take 1 tablet (5 mg total) by mouth daily.    Review of Systems Patient denies any headache, lightheadedness or dizziness.  No sinus or allergy symptoms.   No chest pain, tightness or palpitations.  No increased shortness of breath, cough or congestion.  No nausea or vomiting.  No acid reflux.  No abdominal pain or cramping.  No bowel change, such as diarrhea, constipation, BRBPR or melana.  No urine change.  Overall he feels he is doing well.       Objective:    Physical Exam  Filed Vitals:   10/05/13 0832  BP: 130/80  Pulse: 66  Temp: 97.6 F (36.4 C)   Blood pressure recheck:  92-30/61  80 year old male in no acute distress.  HEENT:  Nares - clear.  Oropharynx - without lesions. NECK:  Supple.  Nontender.  No audible carotid bruit.  HEART:  Appears to be regular.   LUNGS:  No crackles or wheezing audible.  Respirations even and unlabored.   RADIAL PULSE:  Equal bilaterally.  ABDOMEN:  Soft.  Nontender.  Bowel sounds present and normal.  No audible abdominal bruit.  GU:  Not performed.   RECTAL:  Not performed.    EXTREMITIES:  No increased edema present.  DP pulses palpable and equal bilaterally.     FEET:  Without lesions.       Assessment & Plan:  INCREASED PSYCHOSOCIAL STRESSORS.  Takes Lorazepam.  Currently feels he is doing well.  Follow.   HISTORY OF ELEVATED PSA.  Saw Dr Jacqlyn Larsen.  PSA 08/02/11 - 3.90 (stable).  Discussed follow up PSA check.  He declined last year.  States would like checked with next labs.  Will schedule psa.   Desires not to have a prostate check.   CARDIOVASCULAR.  Saw Dr Ubaldo Glassing.  Recommended to continue to follow.  Currently symptoms stable.  Very active with no cardiac symptoms.    PULMONARY.  Asymptomatic.    HEALTH MAINTENANCE.  Physical today.  PSA to be checked as outlined.  Previous colonoscopy revealed multiple adenomatous polyps.  Follow up colonoscopy 08/26/10 revealed internal hemorrhoids otherwise negative.  Per GI, no follow up colonoscopy needed.  He has declined further hemoccult testing.

## 2013-10-05 NOTE — Assessment & Plan Note (Signed)
Blood pressure as outlined.  Same meds.  Follow metabolic panel.   

## 2013-10-05 NOTE — Assessment & Plan Note (Signed)
Creatinine has been stable 1.2-1.3.  Follow.  Continue lisinopril.    

## 2013-10-05 NOTE — Assessment & Plan Note (Signed)
Continue Crestor.  Low cholesterol diet and exercise.  Follow lipid panel and liver function.   Last lipid panel 10/05/13 revealed total cholesterol 108, triglycerides 68, HDL 31 and LDL 63.

## 2013-10-05 NOTE — Addendum Note (Signed)
Addended by: Leeanne Rio on: 10/05/2013 09:30 AM   Modules accepted: Orders

## 2013-10-05 NOTE — Progress Notes (Signed)
Pre visit review using our clinic review tool, if applicable. No additional management support is needed unless otherwise documented below in the visit note. 

## 2013-11-20 ENCOUNTER — Encounter: Payer: Self-pay | Admitting: Internal Medicine

## 2014-02-08 ENCOUNTER — Other Ambulatory Visit (INDEPENDENT_AMBULATORY_CARE_PROVIDER_SITE_OTHER): Payer: Medicare HMO

## 2014-02-08 DIAGNOSIS — I10 Essential (primary) hypertension: Secondary | ICD-10-CM

## 2014-02-08 DIAGNOSIS — E7800 Pure hypercholesterolemia, unspecified: Secondary | ICD-10-CM

## 2014-02-08 DIAGNOSIS — E119 Type 2 diabetes mellitus without complications: Secondary | ICD-10-CM

## 2014-02-08 DIAGNOSIS — Z125 Encounter for screening for malignant neoplasm of prostate: Secondary | ICD-10-CM

## 2014-02-08 LAB — HEPATIC FUNCTION PANEL
ALT: 19 U/L (ref 0–53)
AST: 22 U/L (ref 0–37)
Albumin: 4.1 g/dL (ref 3.5–5.2)
Alkaline Phosphatase: 50 U/L (ref 39–117)
BILIRUBIN DIRECT: 0.1 mg/dL (ref 0.0–0.3)
Total Bilirubin: 0.8 mg/dL (ref 0.2–1.2)
Total Protein: 6.5 g/dL (ref 6.0–8.3)

## 2014-02-08 LAB — BASIC METABOLIC PANEL
BUN: 19 mg/dL (ref 6–23)
CHLORIDE: 103 meq/L (ref 96–112)
CO2: 28 mEq/L (ref 19–32)
Calcium: 9.6 mg/dL (ref 8.4–10.5)
Creatinine, Ser: 1.3 mg/dL (ref 0.4–1.5)
GFR: 57.78 mL/min — ABNORMAL LOW (ref >60.00)
GLUCOSE: 110 mg/dL — AB (ref 70–99)
Potassium: 4.1 mEq/L (ref 3.5–5.1)
SODIUM: 139 meq/L (ref 135–145)

## 2014-02-08 LAB — LIPID PANEL
CHOL/HDL RATIO: 3
Cholesterol: 111 mg/dL (ref 0–200)
HDL: 33.7 mg/dL — ABNORMAL LOW (ref >39.00)
LDL Cholesterol: 67 mg/dL (ref 0–99)
NONHDL: 77.3
Triglycerides: 52 mg/dL (ref 0.0–149.0)
VLDL: 10.4 mg/dL (ref 0.0–40.0)

## 2014-02-08 LAB — MICROALBUMIN / CREATININE URINE RATIO
Creatinine,U: 134.1 mg/dL
MICROALB/CREAT RATIO: 2.2 mg/g (ref 0.0–30.0)
Microalb, Ur: 3 mg/dL — ABNORMAL HIGH (ref 0.0–1.9)

## 2014-02-08 LAB — HEMOGLOBIN A1C: HEMOGLOBIN A1C: 6.3 % (ref 4.6–6.5)

## 2014-02-08 LAB — PSA, MEDICARE: PSA: 4.15 ng/ml — ABNORMAL HIGH (ref 0.10–4.00)

## 2014-02-10 ENCOUNTER — Encounter: Payer: Self-pay | Admitting: Internal Medicine

## 2014-02-13 ENCOUNTER — Encounter: Payer: Self-pay | Admitting: Internal Medicine

## 2014-02-13 ENCOUNTER — Ambulatory Visit (INDEPENDENT_AMBULATORY_CARE_PROVIDER_SITE_OTHER): Payer: Medicare HMO | Admitting: Internal Medicine

## 2014-02-13 VITALS — BP 120/70 | HR 61 | Temp 97.7°F | Ht 70.5 in | Wt 236.0 lb

## 2014-02-13 DIAGNOSIS — Z23 Encounter for immunization: Secondary | ICD-10-CM

## 2014-02-13 DIAGNOSIS — R972 Elevated prostate specific antigen [PSA]: Secondary | ICD-10-CM

## 2014-02-13 DIAGNOSIS — M1A00X0 Idiopathic chronic gout, unspecified site, without tophus (tophi): Secondary | ICD-10-CM

## 2014-02-13 DIAGNOSIS — N289 Disorder of kidney and ureter, unspecified: Secondary | ICD-10-CM

## 2014-02-13 DIAGNOSIS — I10 Essential (primary) hypertension: Secondary | ICD-10-CM

## 2014-02-13 DIAGNOSIS — M1A9XX0 Chronic gout, unspecified, without tophus (tophi): Secondary | ICD-10-CM

## 2014-02-13 DIAGNOSIS — E7800 Pure hypercholesterolemia, unspecified: Secondary | ICD-10-CM

## 2014-02-13 DIAGNOSIS — E119 Type 2 diabetes mellitus without complications: Secondary | ICD-10-CM

## 2014-02-13 NOTE — Progress Notes (Signed)
Subjective:    Patient ID: [name] [name], male    DOB: [birth year], 80 y.o.   MRN: [id number]  HPI 80 year old male with past history of hypertension, hypercholesterolemia, hyperglycemia, BPH and gout who comes in today for a scheduled follow up.   States he has been doing well.  No chest pain or tightness.  Breathing stable.  Sees Dr Ubaldo Glassing for the murmur.  Recommended just following.  Brought in no sugar readings.  A1c stable. Trying to stay active.  Bowels stable.  Overall he feels good and feels he is doing well.  We discussed his elevated psa.  No urinary symptoms.   Past Medical History  Diagnosis Date  . Hypertension   . BPH (benign prostatic hypertrophy)   . Diabetes mellitus   . Hypercholesterolemia   . Gout   . Asthma   . History of chicken pox     Outpatient Encounter Prescriptions as of 02/13/2014  Medication Sig  . allopurinol (ZYLOPRIM) 100 MG tablet Take 1 tablet (100 mg total) by mouth daily.  Marland Kitchen amLODipine (NORVASC) 5 MG tablet Take 1 tablet (5 mg total) by mouth daily.  Marland Kitchen aspirin 81 MG tablet Take 81 mg by mouth daily.  . hydrochlorothiazide (MICROZIDE) 12.5 MG capsule Take 1 capsule (12.5 mg total) by mouth daily.  Marland Kitchen lisinopril (PRINIVIL,ZESTRIL) 40 MG tablet Take 1 tablet (40 mg total) by mouth daily.  Marland Kitchen LORazepam (ATIVAN) 1 MG tablet Takes 1/2 to 1 tablet q day prn  . metoprolol succinate (TOPROL-XL) 50 MG 24 hr tablet Take 1 tablet (50 mg total) by mouth 2 (two) times daily.  . rosuvastatin (CRESTOR) 5 MG tablet Take 1 tablet (5 mg total) by mouth daily.    Review of Systems Patient denies any headache, lightheadedness or dizziness.  No sinus or allergy symptoms.   No chest pain, tightness or palpitations.  No increased shortness of breath, cough or congestion.  No nausea or vomiting.  No acid reflux.  No abdominal pain or cramping.  No bowel change, such as diarrhea, constipation, BRBPR or melana.  No urine change.  Overall he feels he is doing well.        Objective:   Physical Exam  Filed Vitals:   02/13/14 0801  BP: 120/70  Pulse: 61  Temp: 97.7 F (36.5 C)   Blood pressure recheck:  35/108  80 year old male in no acute distress.  HEENT:  Nares - clear.  Oropharynx - without lesions. NECK:  Supple.  Nontender.  No audible carotid bruit.  HEART:  Appears to be regular.  II/VI systolic murmur.   LUNGS:  No crackles or wheezing audible.  Respirations even and unlabored.   RADIAL PULSE:  Equal bilaterally.  ABDOMEN:  Soft.  Nontender.  Bowel sounds present and normal.  No audible abdominal bruit.    EXTREMITIES:  No increased edema present.  DP pulses palpable and equal bilaterally.     FEET:  Without lesions.       Assessment & Plan:  INCREASED PSYCHOSOCIAL STRESSORS.  Takes Lorazepam.  Currently feels he is doing well.  Follow.   HISTORY OF ELEVATED PSA.  Saw Dr Jacqlyn Larsen.  PSA 08/02/11 - 3.90.  Desired to have repeat PSA with his last labs.  PSA slightly elevated - 4.15.  Discussed with him today.  Discussed referral and further evaluation and work up (including checking free PSA).  He declines.  Wants to recheck next year.    CARDIOVASCULAR.  Saw Dr Ubaldo Glassing.  Recommended to continue to follow.  Currently symptoms stable.  Very active with no cardiac symptoms.    PULMONARY.  Asymptomatic.    HEALTH MAINTENANCE.  Physical 10/05/13.  PSA as outlined.  Previous colonoscopy revealed multiple adenomatous polyps.  Follow up colonoscopy 08/26/10 revealed internal hemorrhoids otherwise negative.  Per GI, no follow up colonoscopy needed.  He has declined further hemoccult testing.   I spent 25 minutes with the patient and more than 50% of the time was spent in consultation regarding the above.

## 2014-02-13 NOTE — Progress Notes (Signed)
Pre visit review using our clinic review tool, if applicable. No additional management support is needed unless otherwise documented below in the visit note. 

## 2014-02-17 ENCOUNTER — Encounter: Payer: Self-pay | Admitting: Internal Medicine

## 2014-02-17 DIAGNOSIS — R972 Elevated prostate specific antigen [PSA]: Secondary | ICD-10-CM | POA: Insufficient documentation

## 2014-02-17 NOTE — Assessment & Plan Note (Signed)
Creatinine has been stable 1.2-1.3.  Follow.  Continue lisinopril.

## 2014-02-17 NOTE — Assessment & Plan Note (Signed)
A1c 6.3 (02/08/14).  Up to date with eye checks.  Low carb diet and exercise.  Follow.

## 2014-02-17 NOTE — Assessment & Plan Note (Signed)
PSA elevated 4.15 (02/08/14).  Discussed with him today regarding further w/up, including referral to Dr Jacqlyn Larsen, checking free PSA and earlier f/u PSA.  He declines.  States he agrees to have his PSA rechecked in one year.

## 2014-02-17 NOTE — Assessment & Plan Note (Signed)
Blood pressure as outlined.  Same meds.  Follow metabolic panel.   

## 2014-02-17 NOTE — Assessment & Plan Note (Signed)
Continue Crestor.  Low cholesterol diet and exercise.  Follow lipid panel and liver function.   Last lipid panel 02/08/14 revealed total cholesterol 111, triglycerides 52, HDL 34 and LDL 67 .

## 2014-02-17 NOTE — Assessment & Plan Note (Signed)
On Allopurinol.  Follow.

## 2014-06-18 ENCOUNTER — Encounter: Payer: Self-pay | Admitting: Internal Medicine

## 2014-06-18 ENCOUNTER — Ambulatory Visit (INDEPENDENT_AMBULATORY_CARE_PROVIDER_SITE_OTHER): Payer: Commercial Managed Care - HMO | Admitting: Internal Medicine

## 2014-06-18 VITALS — BP 138/80 | HR 68 | Temp 98.0°F | Ht 70.5 in | Wt 238.8 lb

## 2014-06-18 DIAGNOSIS — E119 Type 2 diabetes mellitus without complications: Secondary | ICD-10-CM

## 2014-06-18 DIAGNOSIS — N289 Disorder of kidney and ureter, unspecified: Secondary | ICD-10-CM

## 2014-06-18 DIAGNOSIS — Z6832 Body mass index (BMI) 32.0-32.9, adult: Secondary | ICD-10-CM | POA: Insufficient documentation

## 2014-06-18 DIAGNOSIS — I10 Essential (primary) hypertension: Secondary | ICD-10-CM | POA: Diagnosis not present

## 2014-06-18 DIAGNOSIS — E7800 Pure hypercholesterolemia, unspecified: Secondary | ICD-10-CM

## 2014-06-18 DIAGNOSIS — R972 Elevated prostate specific antigen [PSA]: Secondary | ICD-10-CM

## 2014-06-18 DIAGNOSIS — E669 Obesity, unspecified: Secondary | ICD-10-CM | POA: Diagnosis not present

## 2014-06-18 MED ORDER — LORAZEPAM 1 MG PO TABS: ORAL_TABLET | ORAL | Status: DC

## 2014-06-18 MED ORDER — ATORVASTATIN CALCIUM 10 MG PO TABS: 10.0000 mg | ORAL_TABLET | Freq: Every day | ORAL | Status: DC

## 2014-06-18 NOTE — Progress Notes (Signed)
Subjective:    Patient ID: [name] [name], male    DOB: [birth year], 80 y.o.   MRN: [id number]  HPI 80 year old male with past history of hypertension, hypercholesterolemia, hyperglycemia, BPH and gout who comes in today for a scheduled follow up.   States he has been doing well.  No chest pain or tightness.  Breathing stable.  Sees Dr Ubaldo Glassing for the murmur.  Recommended just following.  Brought in no sugar readings.  A1c has been stable. Trying to stay active.  Bowels stable.  Overall he feels good and feels he is doing well.  We discussed his elevated psa.  He reports today that he desires no further testing.  No urinary symptoms.  Had questions about changing his crestor (secondary to cost).     Past Medical History  Diagnosis Date  . Hypertension   . BPH (benign prostatic hypertrophy)   . Diabetes mellitus   . Hypercholesterolemia   . Gout   . Asthma   . History of chicken pox     Outpatient Encounter Prescriptions as of 06/18/2014  Medication Sig  . allopurinol (ZYLOPRIM) 100 MG tablet Take 1 tablet (100 mg total) by mouth daily.  Marland Kitchen amLODipine (NORVASC) 5 MG tablet Take 1 tablet (5 mg total) by mouth daily.  Marland Kitchen aspirin 81 MG tablet Take 81 mg by mouth daily.  . hydrochlorothiazide (MICROZIDE) 12.5 MG capsule Take 1 capsule (12.5 mg total) by mouth daily.  Marland Kitchen lisinopril (PRINIVIL,ZESTRIL) 40 MG tablet Take 1 tablet (40 mg total) by mouth daily.  Marland Kitchen LORazepam (ATIVAN) 1 MG tablet Takes 1/2 to 1 tablet q day prn  . metoprolol succinate (TOPROL-XL) 50 MG 24 hr tablet Take 1 tablet (50 mg total) by mouth 2 (two) times daily.  . rosuvastatin (CRESTOR) 5 MG tablet Take 1 tablet (5 mg total) by mouth daily.    Review of Systems Patient denies any headache, lightheadedness or dizziness.  No sinus or allergy symptoms.   No chest pain, tightness or palpitations.  No increased shortness of breath, cough or congestion.  No nausea or vomiting.  No acid reflux.  No abdominal pain or cramping.  No  bowel change, such as diarrhea, constipation, BRBPR or melana.  No urine change.  Overall he feels he is doing well.       Objective:   Physical Exam  Filed Vitals:   06/18/14 0801  BP: 138/80  Pulse: 68  Temp: 98 F (36.7 C)   Blood pressure recheck:  56/37  80 year old male in no acute distress.  HEENT:  Nares - clear.  Oropharynx - without lesions. NECK:  Supple.  Nontender.  No audible carotid bruit.  HEART:  Appears to be regular.  II/VI systolic murmur.   LUNGS:  No crackles or wheezing audible.  Respirations even and unlabored.   RADIAL PULSE:  Equal bilaterally.  ABDOMEN:  Soft.  Nontender.  Bowel sounds present and normal.  No audible abdominal bruit.    EXTREMITIES:  No increased edema present.  DP pulses palpable and equal bilaterally.     FEET:  Without lesions.  See foot exam.        Assessment & Plan:  Obesity (BMI 30-39.9) Diet and exercise.    Essential hypertension Blood pressure as outlined.  Same medication regimen.  Follow pressure.  Check met b.    Type 2 diabetes mellitus without complication Sugars have been under good control.  Low carb diet.  Will follow.  Check met  b and a1c.    Renal insufficiency Cr 1.3 and stable.  Follow.    Hypercholesterolemia Will change to lipitor.  Follow lipid panel and liver function tests.    Elevated PSA See below.  Desires no further testing.    INCREASED PSYCHOSOCIAL STRESSORS.  Takes Lorazepam.  Currently feels he is doing well.  Follow.   HISTORY OF ELEVATED PSA.  Saw Dr Jacqlyn Larsen.  PSA 08/02/11 - 3.90.  Desired to have repeat PSA with his last labs.  PSA slightly elevated - 4.15.  Discussed with him today.  Discussed referral and further evaluation and work up (including checking free PSA).  He declines.  Declines to have psa checked.     CARDIOVASCULAR.  Saw Dr Ubaldo Glassing.  Recommended to continue to follow.  Currently symptoms stable.  Very active with no cardiac symptoms.    PULMONARY.  Asymptomatic.    HEALTH  MAINTENANCE.  Physical 10/05/13.  PSA as outlined.  Previous colonoscopy revealed multiple adenomatous polyps.  Follow up colonoscopy 08/26/10 revealed internal hemorrhoids otherwise negative.  Per GI, no follow up colonoscopy needed.  He has declined further hemoccult testing.   I spent 25 minutes with the patient and more than 50% of the time was spent in consultation regarding the above.

## 2014-06-18 NOTE — Progress Notes (Signed)
Pre visit review using our clinic review tool, if applicable. No additional management support is needed unless otherwise documented below in the visit note. 

## 2014-06-20 ENCOUNTER — Other Ambulatory Visit (INDEPENDENT_AMBULATORY_CARE_PROVIDER_SITE_OTHER): Payer: Commercial Managed Care - HMO

## 2014-06-20 DIAGNOSIS — E119 Type 2 diabetes mellitus without complications: Secondary | ICD-10-CM

## 2014-06-20 DIAGNOSIS — N289 Disorder of kidney and ureter, unspecified: Secondary | ICD-10-CM | POA: Diagnosis not present

## 2014-06-20 DIAGNOSIS — E7800 Pure hypercholesterolemia, unspecified: Secondary | ICD-10-CM

## 2014-06-20 LAB — LIPID PANEL
CHOL/HDL RATIO: 4
Cholesterol: 122 mg/dL (ref 0–200)
HDL: 31.9 mg/dL — ABNORMAL LOW (ref >39.00)
LDL Cholesterol: 73 mg/dL (ref 0–99)
NONHDL: 90.1
Triglycerides: 87 mg/dL (ref 0.0–149.0)
VLDL: 17.4 mg/dL (ref 0.0–40.0)

## 2014-06-20 LAB — BASIC METABOLIC PANEL
BUN: 18 mg/dL (ref 6–23)
CHLORIDE: 104 meq/L (ref 96–112)
CO2: 25 mEq/L (ref 19–32)
Calcium: 9.4 mg/dL (ref 8.4–10.5)
Creatinine, Ser: 1.1 mg/dL (ref 0.4–1.5)
GFR: 66.74 mL/min (ref >60.00)
GLUCOSE: 113 mg/dL — AB (ref 70–99)
POTASSIUM: 3.7 meq/L (ref 3.5–5.1)
SODIUM: 135 meq/L (ref 135–145)

## 2014-06-20 LAB — CBC WITH DIFFERENTIAL/PLATELET
BASOS ABS: 0.1 10*3/uL (ref 0.0–0.1)
BASOS PCT: 0.8 % (ref 0.0–3.0)
EOS ABS: 0.7 10*3/uL (ref 0.0–0.7)
Eosinophils Relative: 7.1 % — ABNORMAL HIGH (ref 0.0–5.0)
HCT: 44.3 % (ref 39.0–52.0)
Hemoglobin: 14.7 g/dL (ref 13.0–17.0)
Lymphocytes Relative: 43.5 % (ref 12.0–46.0)
Lymphs Abs: 4.2 10*3/uL — ABNORMAL HIGH (ref 0.7–4.0)
MCHC: 33.2 g/dL (ref 30.0–36.0)
MCV: 97.2 fl (ref 78.0–100.0)
Monocytes Absolute: 0.8 10*3/uL (ref 0.1–1.0)
Monocytes Relative: 8.2 % (ref 3.0–12.0)
NEUTROS PCT: 40.4 % — AB (ref 43.0–77.0)
Neutro Abs: 3.9 10*3/uL (ref 1.4–7.7)
PLATELETS: 198 10*3/uL (ref 150.0–400.0)
RBC: 4.55 Mil/uL (ref 4.22–5.81)
RDW: 13.6 % (ref 11.5–15.5)
WBC: 9.7 10*3/uL (ref 4.0–10.5)

## 2014-06-20 LAB — HEPATIC FUNCTION PANEL
ALK PHOS: 47 U/L (ref 39–117)
ALT: 14 U/L (ref 0–53)
AST: 23 U/L (ref 0–37)
Albumin: 4.5 g/dL (ref 3.5–5.2)
BILIRUBIN TOTAL: 0.7 mg/dL (ref 0.2–1.2)
Bilirubin, Direct: 0.1 mg/dL (ref 0.0–0.3)
Total Protein: 7.1 g/dL (ref 6.0–8.3)

## 2014-06-20 LAB — HEMOGLOBIN A1C: Hgb A1c MFr Bld: 6.5 % (ref 4.6–6.5)

## 2014-06-20 LAB — TSH: TSH: 2.72 u[IU]/mL (ref 0.35–4.50)

## 2014-06-21 ENCOUNTER — Encounter: Payer: Self-pay | Admitting: Internal Medicine

## 2014-09-04 DIAGNOSIS — H524 Presbyopia: Secondary | ICD-10-CM | POA: Diagnosis not present

## 2014-09-04 DIAGNOSIS — H5210 Myopia, unspecified eye: Secondary | ICD-10-CM | POA: Diagnosis not present

## 2014-09-23 ENCOUNTER — Telehealth: Payer: Self-pay | Admitting: *Deleted

## 2014-09-23 NOTE — Telephone Encounter (Signed)
Pt called left message requesting a referral to [facility] on 4.11.16.  Please advise

## 2014-09-23 NOTE — Telephone Encounter (Signed)
Pt does have an appoint on 4.11.16 with Dr Evorn Gong.  This appoint is [name] [name] yearly f/u with Dr Evorn Gong.

## 2014-09-23 NOTE — Telephone Encounter (Signed)
Is the appt already made and what is the referral for?

## 2014-09-24 ENCOUNTER — Other Ambulatory Visit: Payer: Self-pay | Admitting: Internal Medicine

## 2014-09-24 DIAGNOSIS — Z0000 Encounter for general adult medical examination without abnormal findings: Secondary | ICD-10-CM

## 2014-09-24 NOTE — Telephone Encounter (Signed)
Order placed for dermatology referral.  

## 2014-09-24 NOTE — Progress Notes (Signed)
Order placed for dermatology referral.  

## 2014-09-30 DIAGNOSIS — L821 Other seborrheic keratosis: Secondary | ICD-10-CM | POA: Diagnosis not present

## 2014-09-30 DIAGNOSIS — X32XXXA Exposure to sunlight, initial encounter: Secondary | ICD-10-CM | POA: Diagnosis not present

## 2014-09-30 DIAGNOSIS — D225 Melanocytic nevi of trunk: Secondary | ICD-10-CM | POA: Diagnosis not present

## 2014-09-30 DIAGNOSIS — Z85828 Personal history of other malignant neoplasm of skin: Secondary | ICD-10-CM | POA: Diagnosis not present

## 2014-09-30 DIAGNOSIS — L570 Actinic keratosis: Secondary | ICD-10-CM | POA: Diagnosis not present

## 2014-09-30 DIAGNOSIS — D2239 Melanocytic nevi of other parts of face: Secondary | ICD-10-CM | POA: Diagnosis not present

## 2014-10-06 ENCOUNTER — Other Ambulatory Visit: Payer: Self-pay | Admitting: Internal Medicine

## 2014-10-18 ENCOUNTER — Encounter: Payer: Medicare HMO | Admitting: Internal Medicine

## 2014-10-23 ENCOUNTER — Other Ambulatory Visit: Payer: Self-pay | Admitting: Internal Medicine

## 2014-11-05 ENCOUNTER — Ambulatory Visit (INDEPENDENT_AMBULATORY_CARE_PROVIDER_SITE_OTHER): Payer: Commercial Managed Care - HMO | Admitting: Internal Medicine

## 2014-11-05 ENCOUNTER — Other Ambulatory Visit (INDEPENDENT_AMBULATORY_CARE_PROVIDER_SITE_OTHER): Payer: Commercial Managed Care - HMO

## 2014-11-05 ENCOUNTER — Encounter: Payer: Self-pay | Admitting: Internal Medicine

## 2014-11-05 ENCOUNTER — Telehealth: Payer: Self-pay | Admitting: *Deleted

## 2014-11-05 VITALS — BP 134/70 | HR 54 | Temp 97.6°F | Ht 70.25 in | Wt 234.5 lb

## 2014-11-05 DIAGNOSIS — I10 Essential (primary) hypertension: Secondary | ICD-10-CM

## 2014-11-05 DIAGNOSIS — E119 Type 2 diabetes mellitus without complications: Secondary | ICD-10-CM

## 2014-11-05 DIAGNOSIS — Z23 Encounter for immunization: Secondary | ICD-10-CM

## 2014-11-05 DIAGNOSIS — M1A9XX0 Chronic gout, unspecified, without tophus (tophi): Secondary | ICD-10-CM

## 2014-11-05 DIAGNOSIS — N289 Disorder of kidney and ureter, unspecified: Secondary | ICD-10-CM | POA: Diagnosis not present

## 2014-11-05 DIAGNOSIS — E7800 Pure hypercholesterolemia, unspecified: Secondary | ICD-10-CM

## 2014-11-05 DIAGNOSIS — R972 Elevated prostate specific antigen [PSA]: Secondary | ICD-10-CM

## 2014-11-05 DIAGNOSIS — Z0000 Encounter for general adult medical examination without abnormal findings: Secondary | ICD-10-CM

## 2014-11-05 DIAGNOSIS — E669 Obesity, unspecified: Secondary | ICD-10-CM

## 2014-11-05 MED ORDER — LORAZEPAM 1 MG PO TABS: ORAL_TABLET | ORAL | Status: DC

## 2014-11-05 MED ORDER — COLCHICINE 0.6 MG PO TABS: 0.6000 mg | ORAL_TABLET | Freq: Two times a day (BID) | ORAL | Status: DC | PRN

## 2014-11-05 NOTE — Progress Notes (Signed)
Pre visit review using our clinic review tool, if applicable. No additional management support is needed unless otherwise documented below in the visit note. 

## 2014-11-05 NOTE — Progress Notes (Signed)
Patient ID: [name] [name], male   DOB: 1931-05-27, 81 y.o.   MRN: [id number]   Subjective:    Patient ID: [name] [name], male    DOB: 1931-05-08, 81 y.o.   MRN: [id number]  HPI  Patient here to follow up on his current medical issues as well as for a physical exam.   Stays active.  No cardiac symptoms with increased activity or exertion.  Breathing stable.  No acid reflux.  Bowels stable.  Recent gout flare.  Took colcrys.  Resolved.  Has not had a flare in years.  No recorded sugar readings.  Trying to watch his diet.     Past Medical History  Diagnosis Date  . Hypertension   . BPH (benign prostatic hypertrophy)   . Diabetes mellitus   . Hypercholesterolemia   . Gout   . Asthma   . History of chicken pox     Current Outpatient Prescriptions on File Prior to Visit  Medication Sig Dispense Refill  . allopurinol (ZYLOPRIM) 100 MG tablet TAKE 1 TABLET EVERY DAY 90 tablet 3  . amLODipine (NORVASC) 5 MG tablet TAKE 1 TABLET EVERY DAY 90 tablet 3  . aspirin 81 MG tablet Take 81 mg by mouth daily.    Marland Kitchen atorvastatin (LIPITOR) 10 MG tablet TAKE 1 TABLET (10 MG TOTAL) BY MOUTH DAILY. 30 tablet 6  . hydrochlorothiazide (MICROZIDE) 12.5 MG capsule Take 1 capsule (12.5 mg total) by mouth daily. 90 capsule 3  . lisinopril (PRINIVIL,ZESTRIL) 40 MG tablet TAKE 1 TABLET EVERY DAY 90 tablet 3  . metoprolol succinate (TOPROL-XL) 50 MG 24 hr tablet Take 1 tablet (50 mg total) by mouth 2 (two) times daily. 180 tablet 3   No current facility-administered medications on file prior to visit.    Review of Systems  Constitutional: Negative for appetite change and unexpected weight change.  HENT: Negative for congestion and sinus pressure.   Eyes: Negative for pain and visual disturbance.  Respiratory: Negative for cough, chest tightness and shortness of breath.   Cardiovascular: Negative for chest pain, palpitations and leg swelling.  Gastrointestinal: Negative for nausea, vomiting, abdominal  pain and diarrhea.  Genitourinary: Negative for dysuria and difficulty urinating.  Musculoskeletal: Negative for back pain and joint swelling.  Skin: Negative for color change and rash.  Neurological: Negative for dizziness and headaches.  Hematological: Negative for adenopathy. Does not bruise/bleed easily.  Psychiatric/Behavioral: Negative for dysphoric mood and agitation.       Objective:     Blood pressure recheck:  134/70  Physical Exam  Constitutional: He is oriented to person, place, and time. He appears well-developed and well-nourished. No distress.  HENT:  Head: Normocephalic and atraumatic.  Nose: Nose normal.  Mouth/Throat: Oropharynx is clear and moist. No oropharyngeal exudate.  Eyes: Conjunctivae are normal. Right eye exhibits no discharge. Left eye exhibits no discharge.  Neck: Neck supple. No thyromegaly present.  Cardiovascular: Normal rate and regular rhythm.   Pulmonary/Chest: Breath sounds normal. No respiratory distress. He has no wheezes.  Abdominal: Soft. Bowel sounds are normal. There is no tenderness.  Genitourinary:  Not performed.   Musculoskeletal: He exhibits no edema or tenderness.  Lymphadenopathy:    He has no cervical adenopathy.  Neurological: He is alert and oriented to person, place, and time.  Skin: Skin is warm and dry. No rash noted.  Psychiatric: He has a normal mood and affect. His behavior is normal.    BP 134/70 mmHg  Pulse 54  Temp(Src) 97.6  F (36.4 C) (Oral)  Ht 5' 10.25" (1.784 m)  Wt 234 lb 8 oz (106.369 kg)  BMI 33.42 kg/m2  SpO2 94% Wt Readings from Last 3 Encounters:  11/05/14 234 lb 8 oz (106.369 kg)  06/18/14 238 lb 12 oz (108.296 kg)  02/13/14 236 lb (107.049 kg)     Lab Results  Component Value Date   WBC 9.7 06/20/2014   HGB 14.7 06/20/2014   HCT 44.3 06/20/2014   PLT 198.0 06/20/2014   GLUCOSE 117* 11/05/2014   CHOL 115 11/05/2014   TRIG 61.0 11/05/2014   HDL 35.70* 11/05/2014   LDLCALC 67 11/05/2014    ALT 12 11/05/2014   AST 19 11/05/2014   NA 135 11/05/2014   K 4.1 11/05/2014   CL 101 11/05/2014   CREATININE 1.13 11/05/2014   BUN 18 11/05/2014   CO2 26 11/05/2014   TSH 2.72 06/20/2014   PSA 4.15* 02/08/2014   HGBA1C 6.3 11/05/2014   MICROALBUR 3.0* 02/08/2014       Assessment & Plan:   Problem List Items Addressed This Visit    Diabetes    Sugars have been under good control.  Not checking sugars regularly.  Check and record.  Continue to monitor diet.  Follow met b and a1c.        Relevant Orders   Hemoglobin A1c   Microalbumin / creatinine urine ratio   Elevated PSA    PSA previously elevated.  Discussed f/u today.  Discussed rechecking,etc.  He declines.  Declines to have rechecked.        Gout    Has been controlled.  Flare two weeks ago.  Refilled colcrys.  Follow.        Health care maintenance    Physical 11/05/14.  Declines prostate check or psa.  Colonoscopy 08/26/10 - internal hemorrhoids.  No f/u recommended.        Hypercholesterolemia    Low cholesterol diet and exercise.  Follow lipid panel and liver function tests.  On lipitor.        Relevant Orders   Lipid panel   Hepatic function panel   Hypertension    Blood pressure doing well.  Same medication regimen.  Follow pressures.  Follow metabolic panel.       Relevant Orders   Basic metabolic panel   Obesity (BMI 30-39.9)    Diet and exercise.  Follow.       Renal insufficiency    Continue lisinopril.  Cr has been stable 1.2-1.3.  Follow metabolic panel.        Other Visit Diagnoses    Need for prophylactic vaccination against Streptococcus pneumoniae (pneumococcus)    -  Primary    Relevant Orders    Pneumococcal polysaccharide vaccine 23-valent greater than or equal to 2yo subcutaneous/IM (Completed)      I spent 25 minutes with the patient and more than 50% of the time was spent in consultation regarding the above.     Einar Pheasant, MD

## 2014-11-05 NOTE — Telephone Encounter (Signed)
Orders placed for labs

## 2014-11-05 NOTE — Telephone Encounter (Signed)
Labs and dx?  

## 2014-11-06 ENCOUNTER — Encounter: Payer: Self-pay | Admitting: Internal Medicine

## 2014-11-06 LAB — LIPID PANEL
CHOL/HDL RATIO: 3
Cholesterol: 115 mg/dL (ref 0–200)
HDL: 35.7 mg/dL — ABNORMAL LOW (ref >39.00)
LDL CALC: 67 mg/dL (ref 0–99)
NONHDL: 79.3
TRIGLYCERIDES: 61 mg/dL (ref 0.0–149.0)
VLDL: 12.2 mg/dL (ref 0.0–40.0)

## 2014-11-06 LAB — HEMOGLOBIN A1C: HEMOGLOBIN A1C: 6.3 % (ref 4.6–6.5)

## 2014-11-06 LAB — HEPATIC FUNCTION PANEL
ALT: 12 U/L (ref 0–53)
AST: 19 U/L (ref 0–37)
Albumin: 4.3 g/dL (ref 3.5–5.2)
Alkaline Phosphatase: 64 U/L (ref 39–117)
Bilirubin, Direct: 0.1 mg/dL (ref 0.0–0.3)
TOTAL PROTEIN: 6.8 g/dL (ref 6.0–8.3)
Total Bilirubin: 0.6 mg/dL (ref 0.2–1.2)

## 2014-11-06 LAB — BASIC METABOLIC PANEL
BUN: 18 mg/dL (ref 6–23)
CALCIUM: 9.6 mg/dL (ref 8.4–10.5)
CO2: 26 mEq/L (ref 19–32)
CREATININE: 1.13 mg/dL (ref 0.40–1.50)
Chloride: 101 mEq/L (ref 96–112)
GFR: 65.99 mL/min (ref >60.00)
Glucose, Bld: 117 mg/dL — ABNORMAL HIGH (ref 70–99)
Potassium: 4.1 mEq/L (ref 3.5–5.1)
Sodium: 135 mEq/L (ref 135–145)

## 2014-11-07 NOTE — Telephone Encounter (Signed)
Unread mychart message mailed to patient 

## 2014-11-12 ENCOUNTER — Encounter: Payer: Self-pay | Admitting: Internal Medicine

## 2014-11-12 DIAGNOSIS — Z0000 Encounter for general adult medical examination without abnormal findings: Secondary | ICD-10-CM | POA: Insufficient documentation

## 2014-11-12 NOTE — Assessment & Plan Note (Signed)
Has been controlled.  Flare two weeks ago.  Refilled colcrys.  Follow.

## 2014-11-12 NOTE — Assessment & Plan Note (Signed)
Blood pressure doing well.  Same medication regimen.  Follow pressures.  Follow metabolic panel.   

## 2014-11-12 NOTE — Assessment & Plan Note (Signed)
Physical 11/05/14.  Declines prostate check or psa.  Colonoscopy 08/26/10 - internal hemorrhoids.  No f/u recommended.

## 2014-11-12 NOTE — Assessment & Plan Note (Signed)
Diet and exercise.  Follow.  

## 2014-11-12 NOTE — Assessment & Plan Note (Signed)
PSA previously elevated.  Discussed f/u today.  Discussed rechecking,etc.  He declines.  Declines to have rechecked.

## 2014-11-12 NOTE — Assessment & Plan Note (Signed)
Continue lisinopril.  Cr has been stable 1.2-1.3.  Follow metabolic panel.

## 2014-11-12 NOTE — Assessment & Plan Note (Signed)
Sugars have been under good control.  Not checking sugars regularly.  Check and record.  Continue to monitor diet.  Follow met b and a1c.

## 2014-11-12 NOTE — Assessment & Plan Note (Signed)
Low cholesterol diet and exercise.  Follow lipid panel and liver function tests.  On lipitor.   

## 2015-01-15 ENCOUNTER — Other Ambulatory Visit: Payer: Self-pay | Admitting: Internal Medicine

## 2015-03-29 ENCOUNTER — Other Ambulatory Visit: Payer: Self-pay | Admitting: Internal Medicine

## 2015-04-29 ENCOUNTER — Other Ambulatory Visit: Payer: Self-pay | Admitting: Internal Medicine

## 2015-05-05 ENCOUNTER — Encounter: Payer: Self-pay | Admitting: Internal Medicine

## 2015-05-05 ENCOUNTER — Other Ambulatory Visit (INDEPENDENT_AMBULATORY_CARE_PROVIDER_SITE_OTHER): Payer: Commercial Managed Care - HMO

## 2015-05-05 DIAGNOSIS — E119 Type 2 diabetes mellitus without complications: Secondary | ICD-10-CM | POA: Diagnosis not present

## 2015-05-05 DIAGNOSIS — E7800 Pure hypercholesterolemia, unspecified: Secondary | ICD-10-CM | POA: Diagnosis not present

## 2015-05-05 DIAGNOSIS — I10 Essential (primary) hypertension: Secondary | ICD-10-CM | POA: Diagnosis not present

## 2015-05-05 LAB — HEPATIC FUNCTION PANEL
ALT: 14 U/L (ref 0–53)
AST: 21 U/L (ref 0–37)
Albumin: 4.2 g/dL (ref 3.5–5.2)
Alkaline Phosphatase: 59 U/L (ref 39–117)
Bilirubin, Direct: 0.1 mg/dL (ref 0.0–0.3)
Total Bilirubin: 0.6 mg/dL (ref 0.2–1.2)
Total Protein: 6.9 g/dL (ref 6.0–8.3)

## 2015-05-05 LAB — LIPID PANEL
Cholesterol: 110 mg/dL (ref 0–200)
HDL: 31.8 mg/dL — ABNORMAL LOW (ref >39.00)
LDL Cholesterol: 68 mg/dL (ref 0–99)
NonHDL: 78.18
Total CHOL/HDL Ratio: 3
Triglycerides: 50 mg/dL (ref 0.0–149.0)
VLDL: 10 mg/dL (ref 0.0–40.0)

## 2015-05-05 LAB — BASIC METABOLIC PANEL
BUN: 19 mg/dL (ref 6–23)
CHLORIDE: 103 meq/L (ref 96–112)
CO2: 28 meq/L (ref 19–32)
Calcium: 9.7 mg/dL (ref 8.4–10.5)
Creatinine, Ser: 1.25 mg/dL (ref 0.40–1.50)
GFR: 58.67 mL/min — ABNORMAL LOW (ref >60.00)
Glucose, Bld: 114 mg/dL — ABNORMAL HIGH (ref 70–99)
POTASSIUM: 4.1 meq/L (ref 3.5–5.1)
Sodium: 139 mEq/L (ref 135–145)

## 2015-05-05 LAB — HEMOGLOBIN A1C: Hgb A1c MFr Bld: 6.2 % (ref 4.6–6.5)

## 2015-05-06 LAB — MICROALBUMIN / CREATININE URINE RATIO
Creatinine,U: 136.5 mg/dL
MICROALB UR: 3.4 mg/dL — AB (ref 0.0–1.9)
Microalb Creat Ratio: 2.5 mg/g (ref 0.0–30.0)

## 2015-05-08 ENCOUNTER — Ambulatory Visit (INDEPENDENT_AMBULATORY_CARE_PROVIDER_SITE_OTHER): Payer: Commercial Managed Care - HMO | Admitting: Internal Medicine

## 2015-05-08 ENCOUNTER — Encounter: Payer: Self-pay | Admitting: Internal Medicine

## 2015-05-08 VITALS — BP 128/80 | HR 63 | Temp 97.9°F | Resp 18 | Ht 70.25 in | Wt 234.0 lb

## 2015-05-08 DIAGNOSIS — I10 Essential (primary) hypertension: Secondary | ICD-10-CM | POA: Diagnosis not present

## 2015-05-08 DIAGNOSIS — N289 Disorder of kidney and ureter, unspecified: Secondary | ICD-10-CM

## 2015-05-08 DIAGNOSIS — E119 Type 2 diabetes mellitus without complications: Secondary | ICD-10-CM

## 2015-05-08 DIAGNOSIS — Z23 Encounter for immunization: Secondary | ICD-10-CM

## 2015-05-08 DIAGNOSIS — M25511 Pain in right shoulder: Secondary | ICD-10-CM | POA: Insufficient documentation

## 2015-05-08 MED ORDER — ATORVASTATIN CALCIUM 10 MG PO TABS: ORAL_TABLET | ORAL | Status: DC

## 2015-05-08 NOTE — Assessment & Plan Note (Signed)
Blood pressure under good control.  Continue same medication regimen.  Follow pressures.  Follow metabolic panel.   

## 2015-05-08 NOTE — Assessment & Plan Note (Signed)
Cr just checked and 1.25.  Stay hydrated.  Recheck renal function in a few weeks.

## 2015-05-08 NOTE — Progress Notes (Signed)
Pre-visit discussion using our clinic review tool. No additional management support is needed unless otherwise documented below in the visit note.  

## 2015-05-08 NOTE — Assessment & Plan Note (Signed)
Has been evaluated previously.  Diagnosed with arthritis.  Has had injection.  Helped.  Desires no further intervention at this time.  Will notify me if desires further w/up.

## 2015-05-08 NOTE — Assessment & Plan Note (Signed)
No recent flares 

## 2015-05-08 NOTE — Assessment & Plan Note (Addendum)
Discussed with him today.  Desires no further w/up or testing.  Declines repeat psa test.  Has seen Dr Jacqlyn Larsen previously.

## 2015-05-08 NOTE — Assessment & Plan Note (Signed)
A1c just checked 6.2.  Continue low carb diet and exercise.  Follow met b and a1c.

## 2015-05-08 NOTE — Patient Instructions (Signed)

## 2015-05-08 NOTE — Assessment & Plan Note (Signed)
Diet and exercise.   

## 2015-05-08 NOTE — Assessment & Plan Note (Signed)
Reviewed recent cholesterol results.  Low HDL.  Exercise should increase.  LDL at goal - 68.  Same medication regimen.  Follow lipid panel and liver function tests.

## 2015-05-08 NOTE — Progress Notes (Signed)
Patient ID: [name] [name], male   DOB: [birth year], 81 y.o.   MRN: [id number]   Subjective:    Patient ID: [name] [name], male    DOB: 1931/02/02, 81 y.o.   MRN: [id number]  HPI  Patient with past history of hypercholesterolemia, diabetes, gout and hypertension.  He comes in today to follow up on these issues.  He is doing well.  Staying active.  No cardiac symptoms with increased activity or exertion.  No sob.  No acid reflux reported.  No abdominal pain or cramping.  Bowels stable.  Discussed his creatinine.  Discussed staying hydrated.     Past Medical History  Diagnosis Date  . Hypertension   . BPH (benign prostatic hypertrophy)   . Diabetes mellitus ([place])   . Hypercholesterolemia   . Gout   . Asthma   . History of chicken pox    Past Surgical History  Procedure Laterality Date  . Arm surgery      right arm fx s/p "plate insertion"  . Dental surgery      all teeth extracted   Family History  Problem Relation Age of Onset  . Leukemia Father   . Congestive Heart Failure Mother   . Prostate cancer Neg Hx   . Colon cancer Neg Hx    Social History   Social History  . Marital Status: Married    Spouse Name: N/A  . Number of Children: N/A  . Years of Education: N/A   Social History Main Topics  . Smoking status: Never Smoker   . Smokeless tobacco: Current User    Types: Chew  . Alcohol Use: No  . Drug Use: No  . Sexual Activity: Not Asked   Other Topics Concern  . None   Social History Narrative    Outpatient Encounter Prescriptions as of 05/08/2015  Medication Sig  . allopurinol (ZYLOPRIM) 100 MG tablet TAKE 1 TABLET EVERY DAY  . amLODipine (NORVASC) 5 MG tablet TAKE 1 TABLET EVERY DAY  . aspirin 81 MG tablet Take 81 mg by mouth daily.  Marland Kitchen atorvastatin (LIPITOR) 10 MG tablet TAKE 1 TABLET (10 MG TOTAL) BY MOUTH DAILY.  Marland Kitchen colchicine (COLCRYS) 0.6 MG tablet Take 1 tablet (0.6 mg total) by mouth 2 (two) times daily as needed.  . hydrochlorothiazide  (MICROZIDE) 12.5 MG capsule TAKE 1 CAPSULE EVERY DAY  . lisinopril (PRINIVIL,ZESTRIL) 40 MG tablet TAKE 1 TABLET EVERY DAY  . LORazepam (ATIVAN) 1 MG tablet Takes 1/2 to 1 tablet q day prn  . metoprolol succinate (TOPROL-XL) 50 MG 24 hr tablet TAKE 1 TABLET 2 TIMES DAILY  . [DISCONTINUED] atorvastatin (LIPITOR) 10 MG tablet TAKE 1 TABLET (10 MG TOTAL) BY MOUTH DAILY.   No facility-administered encounter medications on file as of 05/08/2015.    Review of Systems  Constitutional: Negative for appetite change and unexpected weight change.  HENT: Negative for congestion and sinus pressure.   Respiratory: Negative for cough, chest tightness and shortness of breath.   Cardiovascular: Negative for chest pain, palpitations and leg swelling.  Gastrointestinal: Negative for nausea, vomiting, abdominal pain and diarrhea.  Genitourinary: Negative for dysuria and difficulty urinating.  Musculoskeletal: Negative for back pain and joint swelling.       No significant pain.  States occasionally will have issues with his right shoulder.  Has had injection previously.  Found to have arthritis.  Desires no further intervention at this point.  Follow.    Skin: Negative for color change and rash.  Neurological: Negative for dizziness, light-headedness and headaches.  Psychiatric/Behavioral: Negative for dysphoric mood and agitation.       Objective:     Blood pressure rechecked by me:  136/82  Physical Exam  Constitutional: He appears well-developed and well-nourished. No distress.  HENT:  Nose: Nose normal.  Mouth/Throat: Oropharynx is clear and moist.  Eyes: Conjunctivae are normal. Right eye exhibits no discharge. Left eye exhibits no discharge.  Neck: Neck supple. No thyromegaly present.  Cardiovascular: Normal rate and regular rhythm.   Pulmonary/Chest: Effort normal and breath sounds normal. No respiratory distress.  Abdominal: Soft. Bowel sounds are normal. There is no tenderness.    Musculoskeletal: He exhibits no edema or tenderness.  Lymphadenopathy:    He has no cervical adenopathy.  Skin: No rash noted. No erythema.  Psychiatric: He has a normal mood and affect. His behavior is normal.    BP 128/80 mmHg  Pulse 63  Temp(Src) 97.9 F (36.6 C) (Oral)  Resp 18  Ht 5' 10.25" (1.784 m)  Wt 234 lb (106.142 kg)  BMI 33.35 kg/m2  SpO2 96% Wt Readings from Last 3 Encounters:  05/08/15 234 lb (106.142 kg)  11/05/14 234 lb 8 oz (106.369 kg)  06/18/14 238 lb 12 oz (108.296 kg)     Lab Results  Component Value Date   WBC 9.7 06/20/2014   HGB 14.7 06/20/2014   HCT 44.3 06/20/2014   PLT 198.0 06/20/2014   GLUCOSE 114* 05/05/2015   CHOL 110 05/05/2015   TRIG 50.0 05/05/2015   HDL 31.80* 05/05/2015   LDLCALC 68 05/05/2015   ALT 14 05/05/2015   AST 21 05/05/2015   NA 139 05/05/2015   K 4.1 05/05/2015   CL 103 05/05/2015   CREATININE 1.25 05/05/2015   BUN 19 05/05/2015   CO2 28 05/05/2015   TSH 2.72 06/20/2014   PSA 4.15* 02/08/2014   HGBA1C 6.2 05/05/2015   MICROALBUR 3.4* 05/05/2015       Assessment & Plan:   Problem List Items Addressed This Visit    Diabetes ([place]) - Primary    A1c just checked 6.2.  Continue low carb diet and exercise.  Follow met b and a1c.        Relevant Medications   atorvastatin (LIPITOR) 10 MG tablet   Hypertension    Blood pressure under good control.  Continue same medication regimen.  Follow pressures.  Follow metabolic panel.        Relevant Medications   atorvastatin (LIPITOR) 10 MG tablet   Renal insufficiency    Cr just checked and 1.25.  Stay hydrated.  Recheck renal function in a few weeks.        Relevant Orders   Basic metabolic panel   Right shoulder pain    Has been evaluated previously.  Diagnosed with arthritis.  Has had injection.  Helped.  Desires no further intervention at this time.  Will notify me if desires further w/up.           Einar Pheasant, MD

## 2015-05-29 ENCOUNTER — Other Ambulatory Visit (INDEPENDENT_AMBULATORY_CARE_PROVIDER_SITE_OTHER): Payer: Commercial Managed Care - HMO

## 2015-05-29 DIAGNOSIS — N289 Disorder of kidney and ureter, unspecified: Secondary | ICD-10-CM | POA: Diagnosis not present

## 2015-05-29 LAB — BASIC METABOLIC PANEL
BUN: 22 mg/dL (ref 6–23)
CALCIUM: 9.7 mg/dL (ref 8.4–10.5)
CHLORIDE: 102 meq/L (ref 96–112)
CO2: 27 meq/L (ref 19–32)
Creatinine, Ser: 1.24 mg/dL (ref 0.40–1.50)
GFR: 59.2 mL/min — ABNORMAL LOW (ref >60.00)
GLUCOSE: 137 mg/dL — AB (ref 70–99)
Potassium: 4 mEq/L (ref 3.5–5.1)
SODIUM: 139 meq/L (ref 135–145)

## 2015-05-30 ENCOUNTER — Encounter: Payer: Self-pay | Admitting: Internal Medicine

## 2015-06-03 NOTE — Telephone Encounter (Signed)
Unread mychart message mailed to patient 

## 2015-08-05 ENCOUNTER — Other Ambulatory Visit: Payer: Self-pay | Admitting: Internal Medicine

## 2015-08-29 ENCOUNTER — Telehealth: Payer: Self-pay | Admitting: Internal Medicine

## 2015-08-29 NOTE — Telephone Encounter (Signed)
Left msg to call office to schedule AWV with Denisa/msn °

## 2015-09-08 DIAGNOSIS — H5210 Myopia, unspecified eye: Secondary | ICD-10-CM | POA: Diagnosis not present

## 2015-09-08 DIAGNOSIS — H524 Presbyopia: Secondary | ICD-10-CM | POA: Diagnosis not present

## 2015-09-12 ENCOUNTER — Ambulatory Visit: Payer: Commercial Managed Care - HMO

## 2015-09-15 ENCOUNTER — Ambulatory Visit (INDEPENDENT_AMBULATORY_CARE_PROVIDER_SITE_OTHER): Payer: Commercial Managed Care - HMO

## 2015-09-15 VITALS — BP 136/72 | HR 66 | Temp 97.7°F | Resp 14 | Ht 66.2 in | Wt 238.4 lb

## 2015-09-15 DIAGNOSIS — Z0000 Encounter for general adult medical examination without abnormal findings: Secondary | ICD-10-CM

## 2015-09-15 NOTE — Patient Instructions (Addendum)
[name] [name] , Thank you for taking time to come for your Medicare Wellness Visit. I appreciate your ongoing commitment to your health goals. Please review the following plan we discussed and let me know if I can assist you in the future.   Follow up with Dr. Nicki Reaper as needed.   This is a list of the screening recommended for you and due dates:  Health Maintenance  Topic Date Due  . Tetanus Vaccine  08/09/1949  . Shingles Vaccine  08/08/1992  . Hemoglobin A1C  11/02/2015  . Flu Shot  01/20/2016  . Complete foot exam   05/07/2016  . Eye exam for diabetics  09/08/2016  . Pneumonia vaccines  Completed    Health Maintenance, Male A healthy lifestyle and preventative care can promote health and wellness.  Maintain regular health, dental, and eye exams.  Eat a healthy diet. Foods like vegetables, fruits, whole grains, low-fat dairy products, and lean protein foods contain the nutrients you need and are low in calories. Decrease your intake of foods high in solid fats, added sugars, and salt. Get information about a proper diet from your health care provider, if necessary.  Regular physical exercise is one of the most important things you can do for your health. Most adults should get at least 150 minutes of moderate-intensity exercise (any activity that increases your heart rate and causes you to sweat) each week. In addition, most adults need muscle-strengthening exercises on 2 or more days a week.   Maintain a healthy weight. The body mass index (BMI) is a screening tool to identify possible weight problems. It provides an estimate of body fat based on height and weight. Your health care provider can find your BMI and can help you achieve or maintain a healthy weight. For males 20 years and older:  A BMI below 18.5 is considered underweight.  A BMI of 18.5 to 24.9 is normal.  A BMI of 25 to 29.9 is considered overweight.  A BMI of 30 and above is considered obese.  Maintain normal  blood lipids and cholesterol by exercising and minimizing your intake of saturated fat. Eat a balanced diet with plenty of fruits and vegetables. Blood tests for lipids and cholesterol should begin at age 70 and be repeated every 5 years. If your lipid or cholesterol levels are high, you are over age 79, or you are at high risk for heart disease, you may need your cholesterol levels checked more frequently.Ongoing high lipid and cholesterol levels should be treated with medicines if diet and exercise are not working.  If you smoke, find out from your health care provider how to quit. If you do not use tobacco, do not start.  Lung cancer screening is recommended for adults aged 26-80 years who are at high risk for developing lung cancer because of a history of smoking. A yearly low-dose CT scan of the lungs is recommended for people who have at least a 30-pack-year history of smoking and are current smokers or have quit within the past 15 years. A pack year of smoking is smoking an average of 1 pack of cigarettes a day for 1 year (for example, a 30-pack-year history of smoking could mean smoking 1 pack a day for 30 years or 2 packs a day for 15 years). Yearly screening should continue until the smoker has stopped smoking for at least 15 years. Yearly screening should be stopped for people who develop a health problem that would prevent them from having lung  cancer treatment.  If you choose to drink alcohol, do not have more than 2 drinks per day. One drink is considered to be 12 oz (360 mL) of beer, 5 oz (150 mL) of wine, or 1.5 oz (45 mL) of liquor.  Avoid the use of street drugs. Do not share needles with anyone. Ask for help if you need support or instructions about stopping the use of drugs.  High blood pressure causes heart disease and increases the risk of stroke. High blood pressure is more likely to develop in:  People who have blood pressure in the end of the normal range (100-139/85-89 mm  Hg).  People who are overweight or obese.  People who are African American.  If you are 19-35 years of age, have your blood pressure checked every 3-5 years. If you are 86 years of age or older, have your blood pressure checked every year. You should have your blood pressure measured twice--once when you are at a hospital or clinic, and once when you are not at a hospital or clinic. Record the average of the two measurements. To check your blood pressure when you are not at a hospital or clinic, you can use:  An automated blood pressure machine at a pharmacy.  A home blood pressure monitor.  If you are 68-4 years old, ask your health care provider if you should take aspirin to prevent heart disease.  Diabetes screening involves taking a blood sample to check your fasting blood sugar level. This should be done once every 3 years after age 80 if you are at a normal weight and without risk factors for diabetes. Testing should be considered at a younger age or be carried out more frequently if you are overweight and have at least 1 risk factor for diabetes.  Colorectal cancer can be detected and often prevented. Most routine colorectal cancer screening begins at the age of 63 and continues through age 23. However, your health care provider may recommend screening at an earlier age if you have risk factors for colon cancer. On a yearly basis, your health care provider may provide home test kits to check for hidden blood in the stool. A small camera at the end of a tube may be used to directly examine the colon (sigmoidoscopy or colonoscopy) to detect the earliest forms of colorectal cancer. Talk to your health care provider about this at age 13 when routine screening begins. A direct exam of the colon should be repeated every 5-10 years through age 2, unless early forms of precancerous polyps or small growths are found.  People who are at an increased risk for hepatitis B should be screened for this  virus. You are considered at high risk for hepatitis B if:  You were born in a country where hepatitis B occurs often. Talk with your health care provider about which countries are considered high risk.  Your parents were born in a high-risk country and you have not received a shot to protect against hepatitis B (hepatitis B vaccine).  You have HIV or AIDS.  You use needles to inject street drugs.  You live with, or have sex with, someone who has hepatitis B.  You are a man who has sex with other men (MSM).  You get hemodialysis treatment.  You take certain medicines for conditions like cancer, organ transplantation, and autoimmune conditions.  Hepatitis C blood testing is recommended for all people born from [birth year] through 1963 and any individual with known risk factors  for hepatitis C.  Healthy men should no longer receive prostate-specific antigen (PSA) blood tests as part of routine cancer screening. Talk to your health care provider about prostate cancer screening.  Testicular cancer screening is not recommended for adolescents or adult males who have no symptoms. Screening includes self-exam, a health care provider exam, and other screening tests. Consult with your health care provider about any symptoms you have or any concerns you have about testicular cancer.  Practice safe sex. Use condoms and avoid high-risk sexual practices to reduce the spread of sexually transmitted infections (STIs).  You should be screened for STIs, including gonorrhea and chlamydia if:  You are sexually active and are younger than 24 years.  You are older than 24 years, and your health care provider tells you that you are at risk for this type of infection.  Your sexual activity has changed since you were last screened, and you are at an increased risk for chlamydia or gonorrhea. Ask your health care provider if you are at risk.  If you are at risk of being infected with HIV, it is recommended that  you take a prescription medicine daily to prevent HIV infection. This is called pre-exposure prophylaxis (PrEP). You are considered at risk if:  You are a man who has sex with other men (MSM).  You are a heterosexual man who is sexually active with multiple partners.  You take drugs by injection.  You are sexually active with a partner who has HIV.  Talk with your health care provider about whether you are at high risk of being infected with HIV. If you choose to begin PrEP, you should first be tested for HIV. You should then be tested every 3 months for as long as you are taking PrEP.  Use sunscreen. Apply sunscreen liberally and repeatedly throughout the day. You should seek shade when your shadow is shorter than you. Protect yourself by wearing long sleeves, pants, a wide-brimmed hat, and sunglasses year round whenever you are outdoors.  Tell your health care provider of new moles or changes in moles, especially if there is a change in shape or color. Also, tell your health care provider if a mole is larger than the size of a pencil eraser.  A one-time screening for abdominal aortic aneurysm (AAA) and surgical repair of large AAAs by ultrasound is recommended for men aged 93-75 years who are current or former smokers.  Stay current with your vaccines (immunizations).   This information is not intended to replace advice given to you by your health care provider. Make sure you discuss any questions you have with your health care provider.   Document Released: 12/04/2007 Document Revised: 06/28/2014 Document Reviewed: 11/02/2010 Elsevier Interactive Patient Education [employer].

## 2015-09-15 NOTE — Progress Notes (Addendum)
Subjective:   [name] [name] is a 82 y.o. male who presents for an Initial Medicare Annual Wellness Visit.  Review of Systems  No ROS.  Medicare Wellness Visit.  Cardiac Risk Factors include: advanced age (>32men, >19 women);hypertension;male gender;diabetes mellitus    Objective:    Today's Vitals   09/15/15 0802  BP: 136/72  Pulse: 66  Temp: 97.7 F (36.5 C)  TempSrc: Oral  Resp: 14  Height: 5' 6.2" (1.681 m)  Weight: 238 lb 6.4 oz (108.138 kg)  SpO2: 97%   Body mass index is 38.27 kg/(m^2).  Current Medications (verified) Outpatient Encounter Prescriptions as of 09/15/2015  Medication Sig  . allopurinol (ZYLOPRIM) 100 MG tablet TAKE 1 TABLET EVERY DAY  . amLODipine (NORVASC) 5 MG tablet TAKE 1 TABLET EVERY DAY  . aspirin 81 MG tablet Take 81 mg by mouth daily.  Marland Kitchen atorvastatin (LIPITOR) 10 MG tablet TAKE 1 TABLET (10 MG TOTAL) BY MOUTH DAILY.  Marland Kitchen colchicine (COLCRYS) 0.6 MG tablet Take 1 tablet (0.6 mg total) by mouth 2 (two) times daily as needed.  . hydrochlorothiazide (MICROZIDE) 12.5 MG capsule TAKE 1 CAPSULE EVERY DAY  . lisinopril (PRINIVIL,ZESTRIL) 40 MG tablet TAKE 1 TABLET EVERY DAY  . LORazepam (ATIVAN) 1 MG tablet Takes 1/2 to 1 tablet q day prn  . metoprolol succinate (TOPROL-XL) 50 MG 24 hr tablet TAKE 1 TABLET 2 TIMES DAILY   No facility-administered encounter medications on file as of 09/15/2015.    Allergies (verified) Review of patient's allergies indicates no known allergies.   History: Past Medical History  Diagnosis Date  . Hypertension   . BPH (benign prostatic hypertrophy)   . Diabetes mellitus ([place])   . Hypercholesterolemia   . Gout   . Asthma   . History of chicken pox    Past Surgical History  Procedure Laterality Date  . Arm surgery      right arm fx s/p "plate insertion"  . Dental surgery      all teeth extracted   Family History  Problem Relation Age of Onset  . Leukemia Father   . Congestive Heart Failure Mother   .  Prostate cancer Neg Hx   . Colon cancer Neg Hx    Social History   Occupational History  . Not on file.   Social History Main Topics  . Smoking status: Never Smoker   . Smokeless tobacco: Current User    Types: Chew  . Alcohol Use: No  . Drug Use: No  . Sexual Activity: Not Currently   Tobacco Counseling Ready to quit: Not Answered Counseling given: Not Answered   Activities of Daily Living In your present state of health, do you have any difficulty performing the following activities: 09/15/2015  Hearing? N  Vision? N  Difficulty concentrating or making decisions? N  Walking or climbing stairs? N  Dressing or bathing? N  Doing errands, shopping? N  Preparing Food and eating ? N  Using the Toilet? N  In the past six months, have you accidently leaked urine? N  Do you have problems with loss of bowel control? N  Managing your Medications? N  Managing your Finances? N  Housekeeping or managing your Housekeeping? N    Immunizations and Health Maintenance Immunization History  Administered Date(s) Administered  . Influenza Split 05/02/2012  . Influenza,inj,Quad PF,36+ Mos 05/01/2013, 02/13/2014, 05/08/2015  . Pneumococcal Conjugate-13 10/05/2013  . Pneumococcal Polysaccharide-23 11/05/2014   Health Maintenance Due  Topic Date Due  . TETANUS/TDAP  08[birth year]09/1949  . ZOSTAVAX  08/08/1992    Patient Care Team: Einar Pheasant, MD as PCP - General (Internal Medicine)  Indicate any recent Medical Services you may have received from other than [facility] providers in the past year (date may be approximate).    Assessment:   This is a routine wellness examination for [name] [name]. The goal of the wellness visit is to assist the patient how to close the gaps in care and create a preventative care plan for the patient.   Osteoporosis risk reviewed.  Medications reviewed; taking without issues or barriers.  Safety issues reviewed; smoke detectors in the home. Firearms locked in a  safe area in the home. Wears seatbelts when driving or riding with others. No violence in the home.  No identified risk were noted; The patient was oriented x 3; appropriate in dress and manner and no objective failures at ADL's or IADL's.  Type 2 diabetes mell-stable and followed by Dr. Nicki Reaper DMII wo cmp nt st u-stable and followed by Dr. Nicki Reaper   Patient Concerns:  None at this time.  Follow up with PCP as needed.  Hearing/Vision screen Hearing Screening Comments: Passes the whisper test Vision Screening Comments: Followed by [facility] 08/2015 Annual visits Wears reading glasses   Dietary issues and exercise activities discussed: Current Exercise Habits: Home exercise routine (Yard work), Time (Minutes): 30, Frequency (Times/Week): 4, Weekly Exercise (Minutes/Week): 120, Intensity: Moderate  Goals    . Healthy Lifestyle     Stay hydrated! Drink plenty of fluids. Substitute 1 water for a diet drink daily. Low carb foods.  Lean meats, fruits and vegetables. Maintain exercise regiment of doing yard work. Stretch!  Stay active.      Depression Screen PHQ 2/9 Scores 09/15/2015 11/05/2014 10/05/2013 12/31/2012  PHQ - 2 Score 0 0 0 0    Fall Risk Fall Risk  09/15/2015 11/05/2014 10/05/2013 12/31/2012 10/01/2012  Falls in the past year? No No No No No    Cognitive Function: MMSE - Mini Mental State Exam 09/15/2015  Orientation to time 5  Orientation to Place 5  Registration 3  Attention/ Calculation 5  Recall 3  Language- name 2 objects 2  Language- repeat 1  Language- follow 3 step command 3  Language- read & follow direction 1  Write a sentence 1  Copy design 1  Total score 30    Screening Tests Health Maintenance  Topic Date Due  . TETANUS/TDAP  08/09/1951  . ZOSTAVAX  08/08/1992  . HEMOGLOBIN A1C  11/02/2015  . INFLUENZA VACCINE  01/20/2016  . FOOT EXAM  05/07/2016  . OPHTHALMOLOGY EXAM  09/08/2016  . PNA vac Low Risk Adult  Completed        Plan:     End of life planning; Advance aging; Advanced directives discussed. HCPOA/Living Will educational material declined at this time.   During the course of the visit [name] [name] was educated and counseled about the following appropriate screening and preventive services:   Vaccines to include Pneumoccal, Influenza, Hepatitis B, Td, Zostavax, HCV  Electrocardiogram  Colorectal cancer screening  Cardiovascular disease screening  Diabetes screening  Glaucoma screening  Nutrition counseling  Prostate cancer screening  Smoking cessation counseling  Patient Instructions (the written plan) were given to the patient.   Varney Biles, LPN   [id number]    Reviewed above information.  Agree with plan.    Dr Nicki Reaper

## 2015-09-30 DIAGNOSIS — X32XXXA Exposure to sunlight, initial encounter: Secondary | ICD-10-CM | POA: Diagnosis not present

## 2015-09-30 DIAGNOSIS — Z08 Encounter for follow-up examination after completed treatment for malignant neoplasm: Secondary | ICD-10-CM | POA: Diagnosis not present

## 2015-09-30 DIAGNOSIS — L821 Other seborrheic keratosis: Secondary | ICD-10-CM | POA: Diagnosis not present

## 2015-09-30 DIAGNOSIS — L570 Actinic keratosis: Secondary | ICD-10-CM | POA: Diagnosis not present

## 2015-09-30 DIAGNOSIS — Z85828 Personal history of other malignant neoplasm of skin: Secondary | ICD-10-CM | POA: Diagnosis not present

## 2015-10-23 ENCOUNTER — Other Ambulatory Visit: Payer: Self-pay | Admitting: Internal Medicine

## 2015-11-03 ENCOUNTER — Telehealth: Payer: Self-pay | Admitting: Internal Medicine

## 2015-11-03 ENCOUNTER — Other Ambulatory Visit (INDEPENDENT_AMBULATORY_CARE_PROVIDER_SITE_OTHER): Payer: Commercial Managed Care - HMO

## 2015-11-03 DIAGNOSIS — E119 Type 2 diabetes mellitus without complications: Secondary | ICD-10-CM | POA: Diagnosis not present

## 2015-11-03 DIAGNOSIS — E7800 Pure hypercholesterolemia, unspecified: Secondary | ICD-10-CM | POA: Diagnosis not present

## 2015-11-03 DIAGNOSIS — I10 Essential (primary) hypertension: Secondary | ICD-10-CM | POA: Diagnosis not present

## 2015-11-03 DIAGNOSIS — N289 Disorder of kidney and ureter, unspecified: Secondary | ICD-10-CM

## 2015-11-03 LAB — CBC WITH DIFFERENTIAL/PLATELET
BASOS PCT: 0.9 % (ref 0.0–3.0)
Basophils Absolute: 0.1 10*3/uL (ref 0.0–0.1)
Eosinophils Absolute: 0.5 10*3/uL (ref 0.0–0.7)
Eosinophils Relative: 5.7 % — ABNORMAL HIGH (ref 0.0–5.0)
HEMATOCRIT: 43 % (ref 39.0–52.0)
HEMOGLOBIN: 14.4 g/dL (ref 13.0–17.0)
LYMPHS PCT: 49.1 % — AB (ref 12.0–46.0)
Lymphs Abs: 4.6 10*3/uL — ABNORMAL HIGH (ref 0.7–4.0)
MCHC: 33.5 g/dL (ref 30.0–36.0)
MCV: 96.1 fl (ref 78.0–100.0)
MONOS PCT: 7.8 % (ref 3.0–12.0)
Monocytes Absolute: 0.7 10*3/uL (ref 0.1–1.0)
NEUTROS ABS: 3.4 10*3/uL (ref 1.4–7.7)
Neutrophils Relative %: 36.5 % — ABNORMAL LOW (ref 43.0–77.0)
PLATELETS: 180 10*3/uL (ref 150.0–400.0)
RBC: 4.47 Mil/uL (ref 4.22–5.81)
RDW: 13.8 % (ref 11.5–15.5)
WBC: 9.4 10*3/uL (ref 4.0–10.5)

## 2015-11-03 LAB — LIPID PANEL
CHOL/HDL RATIO: 4
Cholesterol: 117 mg/dL (ref 0–200)
HDL: 28.8 mg/dL — ABNORMAL LOW (ref >39.00)
LDL CALC: 77 mg/dL (ref 0–99)
NonHDL: 88.33
TRIGLYCERIDES: 56 mg/dL (ref 0.0–149.0)
VLDL: 11.2 mg/dL (ref 0.0–40.0)

## 2015-11-03 LAB — BASIC METABOLIC PANEL
BUN: 21 mg/dL (ref 6–23)
CALCIUM: 9.5 mg/dL (ref 8.4–10.5)
CO2: 25 mEq/L (ref 19–32)
Chloride: 106 mEq/L (ref 96–112)
Creatinine, Ser: 1.13 mg/dL (ref 0.40–1.50)
GFR: 65.83 mL/min (ref >60.00)
GLUCOSE: 124 mg/dL — AB (ref 70–99)
POTASSIUM: 3.9 meq/L (ref 3.5–5.1)
SODIUM: 138 meq/L (ref 135–145)

## 2015-11-03 LAB — HEPATIC FUNCTION PANEL
ALT: 12 U/L (ref 0–53)
AST: 18 U/L (ref 0–37)
Albumin: 4.3 g/dL (ref 3.5–5.2)
Alkaline Phosphatase: 57 U/L (ref 39–117)
BILIRUBIN DIRECT: 0.2 mg/dL (ref 0.0–0.3)
BILIRUBIN TOTAL: 0.7 mg/dL (ref 0.2–1.2)
TOTAL PROTEIN: 6.4 g/dL (ref 6.0–8.3)

## 2015-11-03 LAB — HEMOGLOBIN A1C: HEMOGLOBIN A1C: 6.3 % (ref 4.6–6.5)

## 2015-11-03 LAB — TSH: TSH: 3.73 u[IU]/mL (ref 0.35–4.50)

## 2015-11-03 NOTE — Telephone Encounter (Signed)
Labs and DX?

## 2015-11-03 NOTE — Telephone Encounter (Signed)
Orders placed for labs

## 2015-11-04 ENCOUNTER — Encounter: Payer: Self-pay | Admitting: Internal Medicine

## 2015-11-06 ENCOUNTER — Encounter: Payer: Self-pay | Admitting: Internal Medicine

## 2015-11-06 ENCOUNTER — Ambulatory Visit (INDEPENDENT_AMBULATORY_CARE_PROVIDER_SITE_OTHER): Payer: Commercial Managed Care - HMO | Admitting: Internal Medicine

## 2015-11-06 VITALS — BP 120/62 | HR 57 | Temp 97.6°F | Resp 18 | Ht 70.0 in | Wt 233.2 lb

## 2015-11-06 DIAGNOSIS — R972 Elevated prostate specific antigen [PSA]: Secondary | ICD-10-CM | POA: Diagnosis not present

## 2015-11-06 DIAGNOSIS — I10 Essential (primary) hypertension: Secondary | ICD-10-CM

## 2015-11-06 DIAGNOSIS — E7800 Pure hypercholesterolemia, unspecified: Secondary | ICD-10-CM

## 2015-11-06 DIAGNOSIS — E119 Type 2 diabetes mellitus without complications: Secondary | ICD-10-CM

## 2015-11-06 DIAGNOSIS — M1A9XX0 Chronic gout, unspecified, without tophus (tophi): Secondary | ICD-10-CM

## 2015-11-06 DIAGNOSIS — Z0000 Encounter for general adult medical examination without abnormal findings: Secondary | ICD-10-CM

## 2015-11-06 DIAGNOSIS — E669 Obesity, unspecified: Secondary | ICD-10-CM

## 2015-11-06 MED ORDER — LORAZEPAM 1 MG PO TABS: ORAL_TABLET | ORAL | Status: DC

## 2015-11-06 NOTE — Progress Notes (Signed)
Patient ID: [name] [name], male   DOB: [birth year], 82 y.o.   MRN: [id number]   Subjective:    Patient ID: [name] [name], male    DOB: 1931/03/12, 82 y.o.   MRN: [id number]  HPI  Patient here for a scheduled physical.  He is doing well.  Feels good.  Stays physically active.  No chest pain.  No sob.  No abdominal pain or cramping.  Bowel moving.  Discussed labs.  Discussed low carb diet and exercise.  Discussed the need to quit chewing tobacco.    Past Medical History  Diagnosis Date  . Hypertension   . BPH (benign prostatic hypertrophy)   . Diabetes mellitus ([place])   . Hypercholesterolemia   . Gout   . Asthma   . History of chicken pox    Past Surgical History  Procedure Laterality Date  . Arm surgery      right arm fx s/p "plate insertion"  . Dental surgery      all teeth extracted   Family History  Problem Relation Age of Onset  . Leukemia Father   . Congestive Heart Failure Mother   . Prostate cancer Neg Hx   . Colon cancer Neg Hx    Social History   Social History  . Marital Status: Married    Spouse Name: N/A  . Number of Children: N/A  . Years of Education: N/A   Social History Main Topics  . Smoking status: Never Smoker   . Smokeless tobacco: Current User    Types: Chew  . Alcohol Use: No  . Drug Use: No  . Sexual Activity: Not Currently   Other Topics Concern  . None   Social History Narrative    Review of Systems  Constitutional: Negative for appetite change and unexpected weight change.  HENT: Negative for congestion and sinus pressure.   Eyes: Negative for pain and visual disturbance.  Respiratory: Negative for cough, chest tightness and shortness of breath.   Cardiovascular: Negative for chest pain, palpitations and leg swelling.  Gastrointestinal: Negative for nausea, vomiting, abdominal pain and diarrhea.  Genitourinary: Negative for dysuria and difficulty urinating.  Musculoskeletal:  Negative for back pain and joint swelling.  Skin: Negative for color change and rash.  Neurological: Negative for dizziness, light-headedness and headaches.  Hematological: Negative for adenopathy. Does not bruise/bleed easily.  Psychiatric/Behavioral: Negative for dysphoric mood and agitation.       Has some trouble sleeping at times.  This occurs when he works outside late.  Takes 1/2 lorazepam prn.  Works well for him with no adverse side effects.         Objective:     Blood pressure rechecked by me:  132/78  Physical Exam  Constitutional: He is oriented to person, place, and time. He appears well-developed and well-nourished. No distress.  HENT:  Head: Normocephalic and atraumatic.  Nose: Nose normal.  Mouth/Throat: Oropharynx is clear and moist. No oropharyngeal exudate.  Eyes: Conjunctivae are normal. Right eye exhibits no discharge. Left eye exhibits no discharge.  Neck: Neck supple. No thyromegaly present.  Cardiovascular: Normal rate and regular rhythm.   Pulmonary/Chest: Breath sounds normal. No respiratory distress. He has no wheezes.  Abdominal: Soft. Bowel sounds are normal. There is no tenderness.  Genitourinary:  Declined.   Musculoskeletal: He exhibits no edema or tenderness.  Lymphadenopathy:    He has no cervical adenopathy.  Neurological: He is alert and oriented to person, place, and time.  Skin: Skin is warm  and dry. No rash noted. No erythema.  Psychiatric: He has a normal mood and affect. His behavior is normal.    BP 120/62 mmHg  Pulse 57  Temp(Src) 97.6 F (36.4 C) (Oral)  Resp 18  Ht _0  (1.778 m)  Wt 233 lb 4 oz (105.802 kg)  BMI 33.47 kg/m2  SpO2 95% Wt Readings from Last 3 Encounters:  11/06/15 233 lb 4 oz (105.802 kg)  09/15/15 238 lb 6.4 oz (108.138 kg)  05/08/15 234 lb (106.142 kg)     Lab Results  Component Value Date   WBC 9.4 11/03/2015   HGB 14.4 11/03/2015   HCT 43.0 11/03/2015   PLT 180.0 11/03/2015   GLUCOSE 124*  11/03/2015   CHOL 117 11/03/2015   TRIG 56.0 11/03/2015   HDL 28.80* 11/03/2015   LDLCALC 77 11/03/2015   ALT 12 11/03/2015   AST 18 11/03/2015   NA 138 11/03/2015   K 3.9 11/03/2015   CL 106 11/03/2015   CREATININE 1.13 11/03/2015   BUN 21 11/03/2015   CO2 25 11/03/2015   TSH 3.73 11/03/2015   PSA 4.15* 02/08/2014   HGBA1C 6.3 11/03/2015   MICROALBUR 3.4* 05/05/2015       Assessment & Plan:   Problem List Items Addressed This Visit    Diabetes ([place])    a1c 6.3.  Low carb diet and exercise.  Follow met b and a1c.        Elevated PSA    Discussed with him again today.  Desires no further w/up and testing.  Declines further w/up.        Gout    On allopurinol.  No recent flares.        Health care maintenance - Primary    Physical today 11/06/15.  Declines prostate checks and psa checks.  Colonoscopy 08/26/10 - internal hemorrhoids.  No f/u recommended.        Hypercholesterolemia    Low cholesterol diet and exercise.  On lipitor.  Follow lipid panel and liver function tests.  HDL 29.  LDL 77.  Exercise to help increased HDL.       Hypertension    Blood pressure has been under good control.  Continue same medication regimen.  Follow pressures.  Follow metabolic panel.            [name] Pheasant, MD

## 2015-11-06 NOTE — Assessment & Plan Note (Signed)
a1c 6.3.  Low carb diet and exercise.  Follow met b and a1c.   

## 2015-11-06 NOTE — Progress Notes (Signed)
Pre-visit discussion using our clinic review tool. No additional management support is needed unless otherwise documented below in the visit note.  

## 2015-11-06 NOTE — Assessment & Plan Note (Signed)
On allopurinol.  No recent flares.  ?

## 2015-11-06 NOTE — Assessment & Plan Note (Signed)
Low cholesterol diet and exercise.  On lipitor.  Follow lipid panel and liver function tests.  HDL 29.  LDL 77.  Exercise to help increased HDL.

## 2015-11-06 NOTE — Assessment & Plan Note (Signed)
Discussed with him again today.  Desires no further w/up and testing.  Declines further w/up.

## 2015-11-06 NOTE — Assessment & Plan Note (Signed)
Physical today 11/06/15.  Declines prostate checks and psa checks.  Colonoscopy 08/26/10 - internal hemorrhoids.  No f/u recommended.

## 2015-11-06 NOTE — Assessment & Plan Note (Signed)
Diet and exercise.   

## 2015-11-06 NOTE — Assessment & Plan Note (Signed)
Blood pressure has been under good control.  Continue same medication regimen.  Follow pressures.  Follow metabolic panel.   

## 2015-12-31 ENCOUNTER — Other Ambulatory Visit: Payer: Self-pay | Admitting: Internal Medicine

## 2016-03-19 ENCOUNTER — Other Ambulatory Visit: Payer: Self-pay | Admitting: Internal Medicine

## 2016-03-19 ENCOUNTER — Ambulatory Visit (INDEPENDENT_AMBULATORY_CARE_PROVIDER_SITE_OTHER): Payer: Commercial Managed Care - HMO | Admitting: Internal Medicine

## 2016-03-19 DIAGNOSIS — Z23 Encounter for immunization: Secondary | ICD-10-CM | POA: Diagnosis not present

## 2016-05-05 ENCOUNTER — Telehealth: Payer: Self-pay

## 2016-05-05 DIAGNOSIS — N289 Disorder of kidney and ureter, unspecified: Secondary | ICD-10-CM

## 2016-05-05 DIAGNOSIS — I10 Essential (primary) hypertension: Secondary | ICD-10-CM

## 2016-05-05 DIAGNOSIS — E7800 Pure hypercholesterolemia, unspecified: Secondary | ICD-10-CM

## 2016-05-05 DIAGNOSIS — E119 Type 2 diabetes mellitus without complications: Secondary | ICD-10-CM

## 2016-05-05 NOTE — Telephone Encounter (Signed)
Pt coming for fasting labs 05/06/16. Please place future orders. Thank you.

## 2016-05-05 NOTE — Telephone Encounter (Signed)
Orders placed for labs

## 2016-05-06 ENCOUNTER — Other Ambulatory Visit (INDEPENDENT_AMBULATORY_CARE_PROVIDER_SITE_OTHER): Payer: Commercial Managed Care - HMO

## 2016-05-06 DIAGNOSIS — E7800 Pure hypercholesterolemia, unspecified: Secondary | ICD-10-CM | POA: Diagnosis not present

## 2016-05-06 DIAGNOSIS — E119 Type 2 diabetes mellitus without complications: Secondary | ICD-10-CM

## 2016-05-06 DIAGNOSIS — I10 Essential (primary) hypertension: Secondary | ICD-10-CM

## 2016-05-06 LAB — LIPID PANEL
CHOLESTEROL: 119 mg/dL (ref 0–200)
HDL: 38.1 mg/dL — ABNORMAL LOW (ref >39.00)
LDL CALC: 71 mg/dL (ref 0–99)
NONHDL: 80.97
Total CHOL/HDL Ratio: 3
Triglycerides: 50 mg/dL (ref 0.0–149.0)
VLDL: 10 mg/dL (ref 0.0–40.0)

## 2016-05-06 LAB — CBC WITH DIFFERENTIAL/PLATELET
BASOS ABS: 0.1 10*3/uL (ref 0.0–0.1)
Basophils Relative: 0.9 % (ref 0.0–3.0)
EOS PCT: 4.3 % (ref 0.0–5.0)
Eosinophils Absolute: 0.5 10*3/uL (ref 0.0–0.7)
HCT: 44.3 % (ref 39.0–52.0)
HEMOGLOBIN: 14.9 g/dL (ref 13.0–17.0)
LYMPHS PCT: 46.6 % — AB (ref 12.0–46.0)
Lymphs Abs: 5.5 10*3/uL — ABNORMAL HIGH (ref 0.7–4.0)
MCHC: 33.5 g/dL (ref 30.0–36.0)
MCV: 96.6 fl (ref 78.0–100.0)
MONOS PCT: 7.7 % (ref 3.0–12.0)
Monocytes Absolute: 0.9 10*3/uL (ref 0.1–1.0)
NEUTROS PCT: 40.5 % — AB (ref 43.0–77.0)
Neutro Abs: 4.7 10*3/uL (ref 1.4–7.7)
Platelets: 213 10*3/uL (ref 150.0–400.0)
RBC: 4.59 Mil/uL (ref 4.22–5.81)
RDW: 13.9 % (ref 11.5–15.5)
WBC: 11.7 10*3/uL — AB (ref 4.0–10.5)

## 2016-05-06 LAB — HEPATIC FUNCTION PANEL
ALBUMIN: 4.5 g/dL (ref 3.5–5.2)
ALK PHOS: 64 U/L (ref 39–117)
ALT: 18 U/L (ref 0–53)
AST: 23 U/L (ref 0–37)
Bilirubin, Direct: 0.1 mg/dL (ref 0.0–0.3)
Total Bilirubin: 0.6 mg/dL (ref 0.2–1.2)
Total Protein: 6.8 g/dL (ref 6.0–8.3)

## 2016-05-06 LAB — BASIC METABOLIC PANEL
BUN: 19 mg/dL (ref 6–23)
CALCIUM: 10 mg/dL (ref 8.4–10.5)
CO2: 28 meq/L (ref 19–32)
CREATININE: 1.29 mg/dL (ref 0.40–1.50)
Chloride: 103 mEq/L (ref 96–112)
GFR: 56.43 mL/min — AB (ref >60.00)
GLUCOSE: 114 mg/dL — AB (ref 70–99)
Potassium: 4.3 mEq/L (ref 3.5–5.1)
SODIUM: 140 meq/L (ref 135–145)

## 2016-05-06 LAB — MICROALBUMIN / CREATININE URINE RATIO
Creatinine,U: 186 mg/dL
MICROALB UR: 8.7 mg/dL — AB (ref 0.0–1.9)
MICROALB/CREAT RATIO: 4.7 mg/g (ref 0.0–30.0)

## 2016-05-06 LAB — HEMOGLOBIN A1C: HEMOGLOBIN A1C: 6 % (ref 4.6–6.5)

## 2016-05-07 ENCOUNTER — Encounter: Payer: Self-pay | Admitting: Internal Medicine

## 2016-05-11 ENCOUNTER — Ambulatory Visit (INDEPENDENT_AMBULATORY_CARE_PROVIDER_SITE_OTHER): Payer: Commercial Managed Care - HMO | Admitting: Internal Medicine

## 2016-05-11 ENCOUNTER — Encounter: Payer: Self-pay | Admitting: Internal Medicine

## 2016-05-11 VITALS — BP 108/64 | HR 64 | Temp 97.8°F | Ht 70.0 in | Wt 234.8 lb

## 2016-05-11 DIAGNOSIS — E7800 Pure hypercholesterolemia, unspecified: Secondary | ICD-10-CM

## 2016-05-11 DIAGNOSIS — R7989 Other specified abnormal findings of blood chemistry: Secondary | ICD-10-CM | POA: Diagnosis not present

## 2016-05-11 DIAGNOSIS — I10 Essential (primary) hypertension: Secondary | ICD-10-CM

## 2016-05-11 DIAGNOSIS — N289 Disorder of kidney and ureter, unspecified: Secondary | ICD-10-CM

## 2016-05-11 DIAGNOSIS — D72829 Elevated white blood cell count, unspecified: Secondary | ICD-10-CM

## 2016-05-11 DIAGNOSIS — E669 Obesity, unspecified: Secondary | ICD-10-CM | POA: Diagnosis not present

## 2016-05-11 DIAGNOSIS — R972 Elevated prostate specific antigen [PSA]: Secondary | ICD-10-CM

## 2016-05-11 DIAGNOSIS — E119 Type 2 diabetes mellitus without complications: Secondary | ICD-10-CM

## 2016-05-11 DIAGNOSIS — R0981 Nasal congestion: Secondary | ICD-10-CM

## 2016-05-11 NOTE — Patient Instructions (Signed)
Saline nasal spray - flush nose at least 2-3x/day  nasacort nasal spray - 2 sprays each nostril one time per day.  Do this in the evening.  

## 2016-05-11 NOTE — Progress Notes (Signed)
Pre visit review using our clinic review tool, if applicable. No additional management support is needed unless otherwise documented below in the visit note. 

## 2016-05-11 NOTE — Progress Notes (Signed)
Patient ID: [name] [name], male   DOB: September 16, 1930, 82 y.o.   MRN: [id number]   Subjective:    Patient ID: [name] [name], male    DOB: Sep 26, 1930, 82 y.o.   MRN: [id number]  HPI  Patient here for a scheduled follow up.  States his family has had congestion, etc.  He reports starting 3 days ago, he developed increased nasal congestion.  No fever.  No headache.  No sinus pressure.  No sore throat. No cough or congestion.  No acid reflux.  No chest pain.  No sob.  No abdominal pain or cramping.  Bowels stable.  No diarrhea. No nausea or vomiting.     Past Medical History:  Diagnosis Date  . Asthma   . BPH (benign prostatic hypertrophy)   . Diabetes mellitus ([place])   . Gout   . History of chicken pox   . Hypercholesterolemia   . Hypertension    Past Surgical History:  Procedure Laterality Date  . arm surgery     right arm fx s/p "plate insertion"  . DENTAL SURGERY     all teeth extracted   Family History  Problem Relation Age of Onset  . Leukemia Father   . Congestive Heart Failure Mother   . Prostate cancer Neg Hx   . Colon cancer Neg Hx    Social History   Social History  . Marital status: Married    Spouse name: N/A  . Number of children: N/A  . Years of education: N/A   Social History Main Topics  . Smoking status: Never Smoker  . Smokeless tobacco: Current User    Types: Chew  . Alcohol use No  . Drug use: No  . Sexual activity: Not Currently   Other Topics Concern  . None   Social History Narrative  . None    Outpatient Encounter Prescriptions as of 05/11/2016  Medication Sig  . allopurinol (ZYLOPRIM) 100 MG tablet TAKE 1 TABLET EVERY DAY  . amLODipine (NORVASC) 5 MG tablet TAKE 1 TABLET EVERY DAY  . aspirin 81 MG tablet Take 81 mg by mouth daily.  Marland Kitchen atorvastatin (LIPITOR) 10 MG tablet TAKE 1 TABLET EVERY DAY  . colchicine (COLCRYS) 0.6 MG tablet Take 1 tablet (0.6 mg total) by mouth 2 (two) times daily as needed.  . hydrochlorothiazide  (MICROZIDE) 12.5 MG capsule TAKE 1 CAPSULE EVERY DAY  . lisinopril (PRINIVIL,ZESTRIL) 40 MG tablet TAKE 1 TABLET EVERY DAY  . LORazepam (ATIVAN) 1 MG tablet Takes 1/2 to 1 tablet q day prn  . metoprolol succinate (TOPROL-XL) 50 MG 24 hr tablet TAKE 1 TABLET TWICE DAILY  . Multiple Vitamin (MULTIVITAMIN) capsule Take 1 capsule by mouth daily.   No facility-administered encounter medications on file as of 05/11/2016.     Review of Systems  Constitutional: Negative for appetite change and unexpected weight change.  HENT: Positive for congestion. Negative for sinus pressure.   Respiratory: Negative for cough, chest tightness and shortness of breath.   Cardiovascular: Negative for chest pain, palpitations and leg swelling.  Gastrointestinal: Negative for abdominal pain, diarrhea, nausea and vomiting.  Genitourinary: Negative for difficulty urinating and dysuria.  Musculoskeletal: Negative for back pain, joint swelling and myalgias.  Skin: Negative for color change and rash.  Neurological: Negative for dizziness, light-headedness and headaches.  Psychiatric/Behavioral: Negative for agitation and dysphoric mood.       Objective:    Physical Exam  Constitutional: He appears well-developed and well-nourished. No distress.  HENT:  Nose: Nose normal.  Mouth/Throat: Oropharynx is clear and moist.  Neck: Neck supple. No thyromegaly present.  Cardiovascular: Normal rate and regular rhythm.   Pulmonary/Chest: Effort normal and breath sounds normal. No respiratory distress.  Abdominal: Soft. Bowel sounds are normal. There is no tenderness.  Musculoskeletal: He exhibits no edema or tenderness.  Lymphadenopathy:    He has no cervical adenopathy.  Skin: No rash noted. No erythema.  Psychiatric: He has a normal mood and affect. His behavior is normal.    BP 108/64   Pulse 64   Temp 97.8 F (36.6 C) (Oral)   Ht '5\' 10"'$  (1.778 m)   Wt 234 lb 12.8 oz (106.5 kg)   SpO2 93%   BMI 33.69 kg/m    Wt Readings from Last 3 Encounters:  05/11/16 234 lb 12.8 oz (106.5 kg)  11/06/15 233 lb 4 oz (105.8 kg)  09/15/15 238 lb 6.4 oz (108.1 kg)     Lab Results  Component Value Date   WBC 11.7 (H) 05/06/2016   HGB 14.9 05/06/2016   HCT 44.3 05/06/2016   PLT 213.0 05/06/2016   GLUCOSE 114 (H) 05/06/2016   CHOL 119 05/06/2016   TRIG 50.0 05/06/2016   HDL 38.10 (L) 05/06/2016   LDLCALC 71 05/06/2016   ALT 18 05/06/2016   AST 23 05/06/2016   NA 140 05/06/2016   K 4.3 05/06/2016   CL 103 05/06/2016   CREATININE 1.29 05/06/2016   BUN 19 05/06/2016   CO2 28 05/06/2016   TSH 3.73 11/03/2015   PSA 4.15 (H) 02/08/2014   HGBA1C 6.0 05/06/2016   MICROALBUR 8.7 (H) 05/06/2016       Assessment & Plan:   Problem List Items Addressed This Visit    Diabetes ([place])    a1c just checked 6.0.  Improved.  Low carb diet.  Follow met b and a1c.        Elevated PSA    Desire no further w/up or testing.  Declines f/u.        Hypercholesterolemia    LDL just checked 71.  HDL improved.  Continue lipitor.  Follow lipid panel and liver function tests.  Low cholesterol diet and exercise.        Hypertension    Blood pressure under good control.  Continue same medication regimen.  Follow pressures.  Follow metabolic panel.        Obesity (BMI 30-39.9)    Diet and exercise.        Renal insufficiency    Creatinine just checked 1.29.  Discussed with him today.  Overall stable.  Stay hydrated.         Other Visit Diagnoses    Leukocytosis, unspecified type    -  Primary   Relevant Orders   CBC with Differential/Platelet   Elevated serum creatinine       Relevant Orders   Creatinine   Congestion of nasal sinus       recent white count slightly elevated.  some nasal congestion as outlined.  does not appear to need abx at this point.  saline/nasacort as directed.  follow.        Einar Pheasant, MD

## 2016-05-15 ENCOUNTER — Encounter: Payer: Self-pay | Admitting: Internal Medicine

## 2016-05-15 NOTE — Assessment & Plan Note (Addendum)
Creatinine just checked 1.29.  Discussed with him today.  Overall stable.  Stay hydrated.

## 2016-05-16 ENCOUNTER — Encounter: Payer: Self-pay | Admitting: Internal Medicine

## 2016-05-16 NOTE — Assessment & Plan Note (Signed)
Desire no further w/up or testing.  Declines f/u.

## 2016-05-16 NOTE — Assessment & Plan Note (Signed)
LDL just checked 71.  HDL improved.  Continue lipitor.  Follow lipid panel and liver function tests.  Low cholesterol diet and exercise.

## 2016-05-16 NOTE — Assessment & Plan Note (Signed)
a1c just checked 6.0.  Improved.  Low carb diet.  Follow met b and a1c.

## 2016-05-16 NOTE — Assessment & Plan Note (Signed)
Blood pressure under good control.  Continue same medication regimen.  Follow pressures.  Follow metabolic panel.   

## 2016-05-16 NOTE — Assessment & Plan Note (Signed)
Diet and exercise.   

## 2016-05-24 ENCOUNTER — Other Ambulatory Visit (INDEPENDENT_AMBULATORY_CARE_PROVIDER_SITE_OTHER): Payer: Commercial Managed Care - HMO

## 2016-05-24 DIAGNOSIS — R7989 Other specified abnormal findings of blood chemistry: Secondary | ICD-10-CM | POA: Diagnosis not present

## 2016-05-24 DIAGNOSIS — D72829 Elevated white blood cell count, unspecified: Secondary | ICD-10-CM

## 2016-05-24 LAB — CBC WITH DIFFERENTIAL/PLATELET
BASOS ABS: 0.1 10*3/uL (ref 0.0–0.1)
Basophils Relative: 0.8 % (ref 0.0–3.0)
EOS ABS: 0.5 10*3/uL (ref 0.0–0.7)
Eosinophils Relative: 4.3 % (ref 0.0–5.0)
HCT: 44.5 % (ref 39.0–52.0)
Hemoglobin: 15.1 g/dL (ref 13.0–17.0)
LYMPHS ABS: 5.3 10*3/uL — AB (ref 0.7–4.0)
LYMPHS PCT: 46.5 % — AB (ref 12.0–46.0)
MCHC: 33.8 g/dL (ref 30.0–36.0)
MCV: 97 fl (ref 78.0–100.0)
Monocytes Absolute: 0.8 10*3/uL (ref 0.1–1.0)
Monocytes Relative: 7.2 % (ref 3.0–12.0)
NEUTROS PCT: 41.2 % — AB (ref 43.0–77.0)
Neutro Abs: 4.7 10*3/uL (ref 1.4–7.7)
PLATELETS: 212 10*3/uL (ref 150.0–400.0)
RBC: 4.59 Mil/uL (ref 4.22–5.81)
RDW: 14.1 % (ref 11.5–15.5)
WBC: 11.4 10*3/uL — ABNORMAL HIGH (ref 4.0–10.5)

## 2016-05-24 LAB — CREATININE, SERUM: Creatinine, Ser: 1.15 mg/dL (ref 0.40–1.50)

## 2016-05-25 ENCOUNTER — Other Ambulatory Visit: Payer: Self-pay | Admitting: Internal Medicine

## 2016-05-25 ENCOUNTER — Telehealth: Payer: Self-pay | Admitting: *Deleted

## 2016-05-25 DIAGNOSIS — D72829 Elevated white blood cell count, unspecified: Secondary | ICD-10-CM

## 2016-05-25 NOTE — Telephone Encounter (Signed)
Pt requested lab results  Pt contact [phone]

## 2016-05-25 NOTE — Progress Notes (Signed)
Order placed for f/u cbc.   

## 2016-06-25 ENCOUNTER — Other Ambulatory Visit (INDEPENDENT_AMBULATORY_CARE_PROVIDER_SITE_OTHER): Payer: Commercial Managed Care - HMO

## 2016-06-25 DIAGNOSIS — D72829 Elevated white blood cell count, unspecified: Secondary | ICD-10-CM | POA: Diagnosis not present

## 2016-06-25 LAB — CBC WITH DIFFERENTIAL/PLATELET
BASOS ABS: 0.1 10*3/uL (ref 0.0–0.1)
Basophils Relative: 0.6 % (ref 0.0–3.0)
Eosinophils Absolute: 0.7 10*3/uL (ref 0.0–0.7)
Eosinophils Relative: 5.5 % — ABNORMAL HIGH (ref 0.0–5.0)
HCT: 44 % (ref 39.0–52.0)
Hemoglobin: 15 g/dL (ref 13.0–17.0)
LYMPHS ABS: 5.7 10*3/uL — AB (ref 0.7–4.0)
Lymphocytes Relative: 46.2 % — ABNORMAL HIGH (ref 12.0–46.0)
MCHC: 34.1 g/dL (ref 30.0–36.0)
MCV: 97 fl (ref 78.0–100.0)
MONO ABS: 0.8 10*3/uL (ref 0.1–1.0)
Monocytes Relative: 6.8 % (ref 3.0–12.0)
NEUTROS PCT: 40.9 % — AB (ref 43.0–77.0)
Neutro Abs: 5 10*3/uL (ref 1.4–7.7)
Platelets: 200 10*3/uL (ref 150.0–400.0)
RBC: 4.53 Mil/uL (ref 4.22–5.81)
RDW: 14.2 % (ref 11.5–15.5)
WBC: 12.3 10*3/uL — ABNORMAL HIGH (ref 4.0–10.5)

## 2016-06-28 ENCOUNTER — Other Ambulatory Visit: Payer: Self-pay | Admitting: Internal Medicine

## 2016-06-28 DIAGNOSIS — D72829 Elevated white blood cell count, unspecified: Secondary | ICD-10-CM

## 2016-06-28 NOTE — Progress Notes (Signed)
Order placed for f/u cbc.   

## 2016-08-10 ENCOUNTER — Other Ambulatory Visit (INDEPENDENT_AMBULATORY_CARE_PROVIDER_SITE_OTHER): Payer: Commercial Managed Care - HMO

## 2016-08-10 DIAGNOSIS — D72829 Elevated white blood cell count, unspecified: Secondary | ICD-10-CM | POA: Diagnosis not present

## 2016-08-10 LAB — CBC WITH DIFFERENTIAL/PLATELET
BASOS PCT: 1.1 % (ref 0.0–3.0)
Basophils Absolute: 0.1 10*3/uL (ref 0.0–0.1)
EOS ABS: 0.6 10*3/uL (ref 0.0–0.7)
Eosinophils Relative: 6 % — ABNORMAL HIGH (ref 0.0–5.0)
HEMATOCRIT: 42.6 % (ref 39.0–52.0)
Hemoglobin: 14.7 g/dL (ref 13.0–17.0)
LYMPHS ABS: 5.1 10*3/uL — AB (ref 0.7–4.0)
LYMPHS PCT: 47.9 % — AB (ref 12.0–46.0)
MCHC: 34.5 g/dL (ref 30.0–36.0)
MCV: 95.8 fl (ref 78.0–100.0)
Monocytes Absolute: 0.9 10*3/uL (ref 0.1–1.0)
Monocytes Relative: 8.3 % (ref 3.0–12.0)
NEUTROS ABS: 3.9 10*3/uL (ref 1.4–7.7)
NEUTROS PCT: 36.7 % — AB (ref 43.0–77.0)
PLATELETS: 182 10*3/uL (ref 150.0–400.0)
RBC: 4.44 Mil/uL (ref 4.22–5.81)
RDW: 13.5 % (ref 11.5–15.5)
WBC: 10.6 10*3/uL — ABNORMAL HIGH (ref 4.0–10.5)

## 2016-08-13 ENCOUNTER — Encounter: Payer: Self-pay | Admitting: Internal Medicine

## 2016-08-20 NOTE — Telephone Encounter (Signed)
Copy on message mailed

## 2016-09-08 DIAGNOSIS — H524 Presbyopia: Secondary | ICD-10-CM | POA: Diagnosis not present

## 2016-09-14 ENCOUNTER — Other Ambulatory Visit: Payer: Self-pay | Admitting: Internal Medicine

## 2016-09-14 ENCOUNTER — Ambulatory Visit (INDEPENDENT_AMBULATORY_CARE_PROVIDER_SITE_OTHER): Payer: Commercial Managed Care - HMO

## 2016-09-14 VITALS — BP 138/80 | HR 61 | Temp 97.6°F | Resp 14 | Ht 70.0 in | Wt 238.8 lb

## 2016-09-14 DIAGNOSIS — Z0000 Encounter for general adult medical examination without abnormal findings: Secondary | ICD-10-CM | POA: Diagnosis not present

## 2016-09-14 NOTE — Progress Notes (Signed)
Subjective:   [name] [name] is a 83 y.o. male who presents for Medicare Annual/Subsequent preventive examination.  Review of Systems:  .No ROS.  Medicare Wellness Visit.  Cardiac Risk Factors include: advanced age (>56men, >67 women);obesity (BMI >30kg/m2);male gender;hypertension;smoking/ tobacco exposure     Objective:    Vitals: BP 138/80 (BP Location: Left Arm, Patient Position: Sitting, Cuff Size: Normal)   Pulse 61   Temp 97.6 F (36.4 C) (Oral)   Resp 14   Ht 5\' 10"  (1.778 m)   Wt 238 lb 12.8 oz (108.3 kg)   SpO2 95%   BMI 34.26 kg/m   Body mass index is 34.26 kg/m.  Tobacco History  Smoking Status  . Never Smoker  Smokeless Tobacco  . Current User  . Types: Chew     Ready to quit: Not Answered Counseling given: Not Answered   Past Medical History:  Diagnosis Date  . Asthma   . BPH (benign prostatic hypertrophy)   . Diabetes mellitus ([place])   . Gout   . History of chicken pox   . Hypercholesterolemia   . Hypertension    Past Surgical History:  Procedure Laterality Date  . arm surgery     right arm fx s/p "plate insertion"  . DENTAL SURGERY     all teeth extracted   Family History  Problem Relation Age of Onset  . Leukemia Father   . Congestive Heart Failure Mother   . Cancer Daughter     Breast Cancer  . Prostate cancer Neg Hx   . Colon cancer Neg Hx    History  Sexual Activity  . Sexual activity: Not Currently    Outpatient Encounter Prescriptions as of 09/14/2016  Medication Sig  . allopurinol (ZYLOPRIM) 100 MG tablet TAKE 1 TABLET EVERY DAY  . amLODipine (NORVASC) 5 MG tablet TAKE 1 TABLET EVERY DAY  . aspirin 81 MG tablet Take 81 mg by mouth daily.  Marland Kitchen atorvastatin (LIPITOR) 10 MG tablet TAKE 1 TABLET EVERY DAY  . colchicine (COLCRYS) 0.6 MG tablet Take 1 tablet (0.6 mg total) by mouth 2 (two) times daily as needed.  . hydrochlorothiazide (MICROZIDE) 12.5 MG capsule TAKE 1 CAPSULE EVERY DAY  . lisinopril (PRINIVIL,ZESTRIL) 40  MG tablet TAKE 1 TABLET EVERY DAY  . LORazepam (ATIVAN) 1 MG tablet Takes 1/2 to 1 tablet q day prn  . metoprolol succinate (TOPROL-XL) 50 MG 24 hr tablet TAKE 1 TABLET TWICE DAILY  . Multiple Vitamin (MULTIVITAMIN) capsule Take 1 capsule by mouth daily.   No facility-administered encounter medications on file as of 09/14/2016.     Activities of Daily Living In your present state of health, do you have any difficulty performing the following activities: 09/14/2016 09/15/2015  Hearing? N N  Vision? N N  Difficulty concentrating or making decisions? N N  Walking or climbing stairs? N N  Dressing or bathing? N N  Doing errands, shopping? N N  Preparing Food and eating ? N N  Using the Toilet? N N  In the past six months, have you accidently leaked urine? N N  Do you have problems with loss of bowel control? N N  Managing your Medications? N N  Managing your Finances? N N  Housekeeping or managing your Housekeeping? N N  Some recent data might be hidden    Patient Care Team: Einar Pheasant, MD as PCP - General (Internal Medicine)   Assessment:    This is a routine wellness examination for [name]. The  goal of the wellness visit is to assist the patient how to close the gaps in care and create a preventative care plan for the patient.   Osteoporosis risk reviewed.  Medications reviewed; taking without issues or barriers.  Safety issues reviewed; smoke detectors in the home. Firearms locked up in the home. Wears seatbelts when driving or riding with others. Patient does wear sunscreen or protective clothing when in direct sunlight. No violence in the home.  Patient is alert, normal appearance, oriented to person/place/and time. Correctly identified the president of the [place], recall of 3/3 words, and performing simple calculations.  Patient displays appropriate judgement and can read correct time from watch face.  No new identified risk were noted.  No failures at ADL's or  IADL's.   BMI- discussed the importance of a healthy diet, water intake and exercise. Educational material provided.   HTN- followed by PCP.  Dental- Wears dentures.  Followed by Dr. Brett Albino  Eye- Visual acuity not assessed per patient preference since they have regular follow up with the ophthalmologist.  Wears corrective lenses.  Sleep patterns- Sleeps 5-8 hours at night.  Wakes feeling rested.   TDAP vaccine deferred per patient preference.  Follow up with insurance.  Educational material provided.  Patient Concerns: None at this time. Follow up with PCP as needed.  Exercise Activities and Dietary recommendations Current Exercise Habits: Home exercise routine, Type of exercise: walking (yard work), Time (Minutes): 20, Frequency (Times/Week): 4, Weekly Exercise (Minutes/Week): 80, Intensity: Mild  Goals    . Healthy Lifestyle          Stay hydrated! Drink plenty of fluids. Substitute 1 water for a diet drink daily. Low carb foods.  Lean meats, fruits and vegetables. Stay active and maintain exercise regimen of doing yard work. Stretch!        Fall Risk Fall Risk  09/14/2016 05/11/2016 11/06/2015 09/15/2015 11/05/2014  Falls in the past year? No No No No No   Depression Screen PHQ 2/9 Scores 09/14/2016 05/11/2016 11/06/2015 09/15/2015  PHQ - 2 Score 0 0 0 0    Cognitive Function MMSE - Mini Mental State Exam 09/14/2016 09/15/2015  Orientation to time 5 5  Orientation to Place 5 5  Registration 3 3  Attention/ Calculation 5 5  Recall 3 3  Language- name 2 objects 2 2  Language- repeat 1 1  Language- follow 3 step command 3 3  Language- read & follow direction 1 1  Write a sentence 1 1  Copy design 1 1  Total score 30 30        Immunization History  Administered Date(s) Administered  . Influenza Split 05/02/2012  . Influenza, High Dose Seasonal PF 03/19/2016  . Influenza,inj,Quad PF,36+ Mos 05/01/2013, 02/13/2014, 05/08/2015  . Pneumococcal Conjugate-13  10/05/2013  . Pneumococcal Polysaccharide-23 11/05/2014   Screening Tests Health Maintenance  Topic Date Due  . TETANUS/TDAP  08/09/1949  . FOOT EXAM  05/07/2016  . OPHTHALMOLOGY EXAM  09/08/2016  . HEMOGLOBIN A1C  11/03/2016  . INFLUENZA VACCINE  Completed  . PNA vac Low Risk Adult  Completed      Plan:   End of life planning; Advanced aging; Advanced directives discussed.  No HCPOA/Living Will.  Additional information declined at this time.  Medicare Attestation I have personally reviewed: The patient's medical and social history Their use of alcohol, tobacco or illicit drugs Their current medications and supplements The patient's functional ability including ADLs,fall risks, home safety risks, cognitive, and hearing and  visual impairment Diet and physical activities Evidence for depression   The patient's weight, height, BMI, and visual acuity have been recorded in the chart.  I have made referrals and provided education to the patient based on review of the above and I have provided the patient with a written personalized care plan for preventive services.    During the course of the visit the patient was educated and counseled about the following appropriate screening and preventive services:   Vaccines to include Pneumoccal, Influenza, Hepatitis B, Td, Zostavax, HCV  Colorectal cancer screening-UTD  Diabetes-followed by PCP  Glaucoma screening-annual eye exam  Nutrition counseling   Smoking cessation counseling  Patient Instructions (the written plan) was given to the patient.    Varney Biles, LPN  [id number]/[id number]  Reviewed above information.  Agree with plan.  Dr Nicki Reaper

## 2016-09-14 NOTE — Patient Instructions (Addendum)
.    [name] [name] , Thank you for taking time to come for your Medicare Wellness Visit. I appreciate your ongoing commitment to your health goals. Please review the following plan we discussed and let me know if I can assist you in the future.   Follow up with Dr. Nicki Reaper as needed.    Have a great day!  These are the goals we discussed: Goals    . Healthy Lifestyle          Stay hydrated! Drink plenty of fluids. Substitute 1 water for a diet drink daily. Low carb foods.  Lean meats, fruits and vegetables. Stay active and maintain exercise regimen of doing yard work. Stretch!         This is a list of the screening recommended for you and due dates:  Health Maintenance  Topic Date Due  . Tetanus Vaccine  08[birth year]09/1949  . Complete foot exam   05/07/2016  . Eye exam for diabetics  09/08/2016  . Hemoglobin A1C  11/03/2016  . Flu Shot  Completed  . Pneumonia vaccines  Completed

## 2016-09-29 DIAGNOSIS — X32XXXA Exposure to sunlight, initial encounter: Secondary | ICD-10-CM | POA: Diagnosis not present

## 2016-09-29 DIAGNOSIS — L570 Actinic keratosis: Secondary | ICD-10-CM | POA: Diagnosis not present

## 2016-09-29 DIAGNOSIS — L821 Other seborrheic keratosis: Secondary | ICD-10-CM | POA: Diagnosis not present

## 2016-09-29 DIAGNOSIS — Z08 Encounter for follow-up examination after completed treatment for malignant neoplasm: Secondary | ICD-10-CM | POA: Diagnosis not present

## 2016-09-29 DIAGNOSIS — L538 Other specified erythematous conditions: Secondary | ICD-10-CM | POA: Diagnosis not present

## 2016-09-29 DIAGNOSIS — Z85828 Personal history of other malignant neoplasm of skin: Secondary | ICD-10-CM | POA: Diagnosis not present

## 2016-09-29 DIAGNOSIS — L820 Inflamed seborrheic keratosis: Secondary | ICD-10-CM | POA: Diagnosis not present

## 2016-09-29 DIAGNOSIS — D225 Melanocytic nevi of trunk: Secondary | ICD-10-CM | POA: Diagnosis not present

## 2016-11-01 DIAGNOSIS — H2513 Age-related nuclear cataract, bilateral: Secondary | ICD-10-CM | POA: Diagnosis not present

## 2016-11-09 DIAGNOSIS — H2513 Age-related nuclear cataract, bilateral: Secondary | ICD-10-CM | POA: Diagnosis not present

## 2016-11-10 ENCOUNTER — Encounter: Payer: Self-pay | Admitting: *Deleted

## 2016-11-11 ENCOUNTER — Other Ambulatory Visit: Payer: Self-pay | Admitting: Internal Medicine

## 2016-11-11 ENCOUNTER — Encounter: Payer: Self-pay | Admitting: Internal Medicine

## 2016-11-11 ENCOUNTER — Ambulatory Visit (INDEPENDENT_AMBULATORY_CARE_PROVIDER_SITE_OTHER): Payer: Medicare HMO | Admitting: Internal Medicine

## 2016-11-11 VITALS — BP 138/68 | HR 65 | Temp 97.6°F | Resp 12 | Ht 70.47 in | Wt 237.6 lb

## 2016-11-11 DIAGNOSIS — E119 Type 2 diabetes mellitus without complications: Secondary | ICD-10-CM

## 2016-11-11 DIAGNOSIS — D72829 Elevated white blood cell count, unspecified: Secondary | ICD-10-CM

## 2016-11-11 DIAGNOSIS — E669 Obesity, unspecified: Secondary | ICD-10-CM | POA: Diagnosis not present

## 2016-11-11 DIAGNOSIS — R972 Elevated prostate specific antigen [PSA]: Secondary | ICD-10-CM

## 2016-11-11 DIAGNOSIS — Z0000 Encounter for general adult medical examination without abnormal findings: Secondary | ICD-10-CM

## 2016-11-11 DIAGNOSIS — I10 Essential (primary) hypertension: Secondary | ICD-10-CM | POA: Diagnosis not present

## 2016-11-11 DIAGNOSIS — Z01818 Encounter for other preprocedural examination: Secondary | ICD-10-CM | POA: Diagnosis not present

## 2016-11-11 DIAGNOSIS — E7800 Pure hypercholesterolemia, unspecified: Secondary | ICD-10-CM

## 2016-11-11 DIAGNOSIS — N289 Disorder of kidney and ureter, unspecified: Secondary | ICD-10-CM | POA: Diagnosis not present

## 2016-11-11 LAB — BASIC METABOLIC PANEL
BUN: 20 mg/dL (ref 6–23)
CO2: 28 mEq/L (ref 19–32)
CREATININE: 1.42 mg/dL (ref 0.40–1.50)
Calcium: 10 mg/dL (ref 8.4–10.5)
Chloride: 101 mEq/L (ref 96–112)
GFR: 50.45 mL/min — AB (ref >60.00)
GLUCOSE: 130 mg/dL — AB (ref 70–99)
POTASSIUM: 3.9 meq/L (ref 3.5–5.1)
Sodium: 137 mEq/L (ref 135–145)

## 2016-11-11 LAB — HEPATIC FUNCTION PANEL
ALK PHOS: 63 U/L (ref 39–117)
ALT: 16 U/L (ref 0–53)
AST: 21 U/L (ref 0–37)
Albumin: 4.5 g/dL (ref 3.5–5.2)
BILIRUBIN DIRECT: 0.2 mg/dL (ref 0.0–0.3)
TOTAL PROTEIN: 7.1 g/dL (ref 6.0–8.3)
Total Bilirubin: 0.5 mg/dL (ref 0.2–1.2)

## 2016-11-11 LAB — CBC WITH DIFFERENTIAL/PLATELET
BASOS ABS: 0.1 10*3/uL (ref 0.0–0.1)
Basophils Relative: 1.1 % (ref 0.0–3.0)
EOS PCT: 4.5 % (ref 0.0–5.0)
Eosinophils Absolute: 0.5 10*3/uL (ref 0.0–0.7)
HEMATOCRIT: 44 % (ref 39.0–52.0)
HEMOGLOBIN: 14.9 g/dL (ref 13.0–17.0)
LYMPHS PCT: 36.2 % (ref 12.0–46.0)
Lymphs Abs: 4.3 10*3/uL — ABNORMAL HIGH (ref 0.7–4.0)
MCHC: 33.9 g/dL (ref 30.0–36.0)
MCV: 96.9 fl (ref 78.0–100.0)
MONOS PCT: 7.4 % (ref 3.0–12.0)
Monocytes Absolute: 0.9 10*3/uL (ref 0.1–1.0)
NEUTROS PCT: 50.8 % (ref 43.0–77.0)
Neutro Abs: 6.1 10*3/uL (ref 1.4–7.7)
Platelets: 196 10*3/uL (ref 150.0–400.0)
RBC: 4.54 Mil/uL (ref 4.22–5.81)
RDW: 13.6 % (ref 11.5–15.5)
WBC: 12 10*3/uL — AB (ref 4.0–10.5)

## 2016-11-11 LAB — HM DIABETES FOOT EXAM

## 2016-11-11 LAB — TSH: TSH: 3.29 u[IU]/mL (ref 0.35–4.50)

## 2016-11-11 LAB — HEMOGLOBIN A1C: Hgb A1c MFr Bld: 6.3 % (ref 4.6–6.5)

## 2016-11-11 MED ORDER — LISINOPRIL 40 MG PO TABS: 40.0000 mg | ORAL_TABLET | Freq: Every day | ORAL | 3 refills | Status: DC

## 2016-11-11 MED ORDER — LORAZEPAM 1 MG PO TABS: ORAL_TABLET | ORAL | 2 refills | Status: DC

## 2016-11-11 MED ORDER — METOPROLOL SUCCINATE ER 50 MG PO TB24: 50.0000 mg | ORAL_TABLET | Freq: Two times a day (BID) | ORAL | 3 refills | Status: DC

## 2016-11-11 NOTE — Assessment & Plan Note (Signed)
Low carb diet and exercise.  Follow met b and a1c.   Lab Results  Component Value Date   HGBA1C 6.0 05/06/2016

## 2016-11-11 NOTE — Assessment & Plan Note (Signed)
Low cholesterol diet and exercise.  On lipitor.  Follow lipid panel and liver function tests.   

## 2016-11-11 NOTE — Progress Notes (Signed)
Order placed for f/u labs.  

## 2016-11-11 NOTE — Progress Notes (Signed)
Pre-visit discussion using our clinic review tool. No additional management support is needed unless otherwise documented below in the visit note.  

## 2016-11-11 NOTE — Assessment & Plan Note (Signed)
Blood pressure on recheck improved as outlined.  Continue same medication regimen.  Follow pressures.  Follow metabolic panel.

## 2016-11-11 NOTE — Progress Notes (Signed)
Patient ID: [name] [name], male   DOB: 1931/05/30, 83 y.o.   MRN: [id number]   Subjective:    Patient ID: [name] [name], male    DOB: July 04, 1930, 83 y.o.   MRN: [id number]  HPI  Patient here for his physical exam.  States he is doing well.  Feels good.  Stays active.  No chest pain.  No sob.  No acid reflux.  No abdominal pain or cramping.  Bowels stable.  Planning for cataract surgery 11/17/16.  Had his pre op.  Takes lorazepam prn.  Rarely takes.  Overall feels good.    Past Medical History:  Diagnosis Date  . Arthritis    right shoulder  . Asthma   . BPH (benign prostatic hypertrophy)   . Gout   . Heart murmur   . History of chicken pox   . Hypercholesterolemia   . Hypertension   . Wears dentures    full upper and lower   Past Surgical History:  Procedure Laterality Date  . arm surgery     right arm fx s/p "plate insertion"  . DENTAL SURGERY     all teeth extracted   Family History  Problem Relation Age of Onset  . Leukemia Father   . Congestive Heart Failure Mother   . Cancer Daughter        Breast Cancer  . Prostate cancer Neg Hx   . Colon cancer Neg Hx    Social History   Social History  . Marital status: Married    Spouse name: N/A  . Number of children: N/A  . Years of education: N/A   Social History Main Topics  . Smoking status: Never Smoker  . Smokeless tobacco: Current User    Types: Chew  . Alcohol use No  . Drug use: No  . Sexual activity: Not Currently   Other Topics Concern  . None   Social History Narrative  . None    Outpatient Encounter Prescriptions as of 11/11/2016  Medication Sig  . allopurinol (ZYLOPRIM) 100 MG tablet TAKE 1 TABLET EVERY DAY  . amLODipine (NORVASC) 5 MG tablet TAKE 1 TABLET EVERY DAY  . aspirin 81 MG tablet Take 81 mg by mouth daily.  Marland Kitchen atorvastatin (LIPITOR) 10 MG tablet TAKE 1 TABLET EVERY DAY  . colchicine (COLCRYS) 0.6 MG tablet Take 1 tablet (0.6 mg total) by mouth 2 (two) times daily as needed.  .  hydrochlorothiazide (MICROZIDE) 12.5 MG capsule TAKE 1 CAPSULE EVERY DAY  . lisinopril (PRINIVIL,ZESTRIL) 40 MG tablet Take 1 tablet (40 mg total) by mouth daily.  Marland Kitchen LORazepam (ATIVAN) 1 MG tablet Takes 1/2 to 1 tablet q day prn  . metoprolol succinate (TOPROL-XL) 50 MG 24 hr tablet Take 1 tablet (50 mg total) by mouth 2 (two) times daily. Take with or immediately following a meal.  . Multiple Vitamin (MULTIVITAMIN) capsule Take 1 capsule by mouth daily.  . [DISCONTINUED] lisinopril (PRINIVIL,ZESTRIL) 40 MG tablet TAKE 1 TABLET EVERY DAY  . [DISCONTINUED] LORazepam (ATIVAN) 1 MG tablet Takes 1/2 to 1 tablet q day prn  . [DISCONTINUED] metoprolol succinate (TOPROL-XL) 50 MG 24 hr tablet TAKE 1 TABLET TWICE DAILY   No facility-administered encounter medications on file as of 11/11/2016.     Review of Systems  Constitutional: Negative for appetite change and unexpected weight change.  HENT: Negative for congestion and sinus pressure.   Eyes: Negative for pain and visual disturbance.  Respiratory: Negative for cough, chest tightness and shortness of  breath.   Cardiovascular: Negative for chest pain, palpitations and leg swelling.  Gastrointestinal: Negative for abdominal pain, diarrhea, nausea and vomiting.  Genitourinary: Negative for difficulty urinating and dysuria.  Musculoskeletal: Negative for back pain and joint swelling.  Skin: Negative for color change and rash.  Neurological: Negative for dizziness, light-headedness and headaches.  Hematological: Negative for adenopathy. Does not bruise/bleed easily.  Psychiatric/Behavioral: Negative for agitation and dysphoric mood.       Objective:     Blood pressure rechecked by me:  138/68  Physical Exam  Constitutional: He is oriented to person, place, and time. He appears well-developed and well-nourished. No distress.  HENT:  Head: Normocephalic and atraumatic.  Nose: Nose normal.  Mouth/Throat: Oropharynx is clear and moist. No  oropharyngeal exudate.  Eyes: Conjunctivae are normal. Right eye exhibits no discharge. Left eye exhibits no discharge.  Neck: Neck supple. No thyromegaly present.  Cardiovascular: Normal rate and regular rhythm.   Pulmonary/Chest: Breath sounds normal. No respiratory distress. He has no wheezes.  Abdominal: Soft. Bowel sounds are normal. There is no tenderness.  Genitourinary:  Genitourinary Comments: Pt deferred.    Musculoskeletal: He exhibits no edema or tenderness.  Lymphadenopathy:    He has no cervical adenopathy.  Neurological: He is alert and oriented to person, place, and time.  Foot exam - no decreased sensation to pin prick or light touch.  Pulses palpable and appear to be wnl.    Skin: Skin is warm and dry. No rash noted. No erythema.  Psychiatric: He has a normal mood and affect. His behavior is normal.    BP 138/68   Pulse 65   Temp 97.6 F (36.4 C) (Oral)   Resp 12   Ht 5' 10.47" (1.79 m)   Wt 237 lb 9.6 oz (107.8 kg)   SpO2 96%   BMI 33.64 kg/m  Wt Readings from Last 3 Encounters:  11/11/16 237 lb 9.6 oz (107.8 kg)  09/14/16 238 lb 12.8 oz (108.3 kg)  05/11/16 234 lb 12.8 oz (106.5 kg)     Lab Results  Component Value Date   WBC 12.0 (H) 11/11/2016   HGB 14.9 11/11/2016   HCT 44.0 11/11/2016   PLT 196.0 11/11/2016   GLUCOSE 130 (H) 11/11/2016   CHOL 119 05/06/2016   TRIG 50.0 05/06/2016   HDL 38.10 (L) 05/06/2016   LDLCALC 71 05/06/2016   ALT 16 11/11/2016   AST 21 11/11/2016   NA 137 11/11/2016   K 3.9 11/11/2016   CL 101 11/11/2016   CREATININE 1.42 11/11/2016   BUN 20 11/11/2016   CO2 28 11/11/2016   TSH 3.29 11/11/2016   PSA 4.15 (H) 02/08/2014   HGBA1C 6.3 11/11/2016   MICROALBUR 8.7 (H) 05/06/2016       Assessment & Plan:   Problem List Items Addressed This Visit    Diabetes ([place])    Low carb diet and exercise.  Follow met b and a1c.   Lab Results  Component Value Date   HGBA1C 6.0 05/06/2016        Relevant Medications    lisinopril (PRINIVIL,ZESTRIL) 40 MG tablet   Other Relevant Orders   Hemoglobin A1c (Completed)   Basic metabolic panel (Completed)   Elevated PSA    Desires no further testing, psa check or further evaluation.  Discussed again with him today.       Health care maintenance    Physical today 11/11/16.  Declines prostate checks and psa checks.  Colonoscopy 08/26/10 - internal  hemorrhoids.  No f/u recommended.        Hypercholesterolemia    Low cholesterol diet and exercise.  On lipitor.  Follow lipid panel and liver function tests.        Relevant Medications   metoprolol succinate (TOPROL-XL) 50 MG 24 hr tablet   lisinopril (PRINIVIL,ZESTRIL) 40 MG tablet   Other Relevant Orders   Hepatic function panel (Completed)   Hypertension    Blood pressure on recheck improved as outlined.  Continue same medication regimen.  Follow pressures.  Follow metabolic panel.        Relevant Medications   metoprolol succinate (TOPROL-XL) 50 MG 24 hr tablet   lisinopril (PRINIVIL,ZESTRIL) 40 MG tablet   Other Relevant Orders   CBC with Differential/Platelet (Completed)   TSH (Completed)   Obesity (BMI 30-39.9)    Diet and exercise.  Follow.       Pre-op evaluation - Primary    Planning for cataract surgery. EKG - SR with TWI v3-v6.  Present on previous EKG.  He is currently asymptomatic.  I feel he is at low risk from a cardiac standpoint to proceed with planned cataract surgery.  He will need close intra op and post op monitoring of his heart rate and blood pressure to avoid extremes.        Relevant Orders   EKG 12-Lead (Completed)   Renal insufficiency    Stay hydrated.  Follow renal function.  Avoid antiinflammatories.            Einar Pheasant, MD

## 2016-11-11 NOTE — Assessment & Plan Note (Signed)
Physical today 11/11/16.  Declines prostate checks and psa checks.  Colonoscopy 08/26/10 - internal hemorrhoids.  No f/u recommended.

## 2016-11-14 ENCOUNTER — Encounter: Payer: Self-pay | Admitting: Internal Medicine

## 2016-11-14 DIAGNOSIS — Z01818 Encounter for other preprocedural examination: Secondary | ICD-10-CM | POA: Insufficient documentation

## 2016-11-14 NOTE — Assessment & Plan Note (Signed)
Desires no further testing, psa check or further evaluation.  Discussed again with him today.

## 2016-11-14 NOTE — Assessment & Plan Note (Signed)
Stay hydrated.  Follow renal function.  Avoid antiinflammatories.

## 2016-11-14 NOTE — Assessment & Plan Note (Signed)
Planning for cataract surgery. EKG - SR with TWI v3-v6.  Present on previous EKG.  He is currently asymptomatic.  I feel he is at low risk from a cardiac standpoint to proceed with planned cataract surgery.  He will need close intra op and post op monitoring of his heart rate and blood pressure to avoid extremes.

## 2016-11-14 NOTE — Assessment & Plan Note (Signed)
Diet and exercise.  Follow.  

## 2016-11-16 NOTE — Discharge Instructions (Signed)
Cataract Surgery, Care After °Refer to this sheet in the next few weeks. These instructions provide you with information about caring for yourself after your procedure. Your health care provider may also give you more specific instructions. Your treatment has been planned according to current medical practices, but problems sometimes occur. Call your health care provider if you have any problems or questions after your procedure. °What can I expect after the procedure? °After the procedure, it is common to have: °· Itching. °· Discomfort. °· Fluid discharge. °· Sensitivity to light and to touch. °· Bruising. °Follow these instructions at home: °Eye Care  °· Check your eye every day for signs of infection. Watch for: °¨ Redness, swelling, or pain. °¨ Fluid, blood, or pus. °¨ Warmth. °¨ Bad smell. °Activity  °· Avoid strenuous activities, such as playing contact sports, for as long as told by your health care provider. °· Do not drive or operate heavy machinery until your health care provider approves. °· Do not bend or lift heavy objects . Bending increases pressure in the eye. You can walk, climb stairs, and do light household chores. °· Ask your health care provider when you can return to work. If you work in a dusty environment, you may be advised to wear protective eyewear for a period of time. °General instructions  °· Take or apply over-the-counter and prescription medicines only as told by your health care provider. This includes eye drops. °· Do not touch or rub your eyes. °· If you were given a protective shield, wear it as told by your health care provider. If you were not given a protective shield, wear sunglasses as told by your health care provider to protect your eyes. °· Keep the area around your eye clean and dry. Avoid swimming or allowing water to hit you directly in the face while showering until told by your health care provider. Keep soap and shampoo out of your eyes. °· Do not put a contact lens  into the affected eye or eyes until your health care provider approves. °· Keep all follow-up visits as told by your health care provider. This is important. °Contact a health care provider if: ° °· You have increased bruising around your eye. °· You have pain that is not helped with medicine. °· You have a fever. °· You have redness, swelling, or pain in your eye. °· You have fluid, blood, or pus coming from your incision. °· Your vision gets worse. °Get help right away if: °· You have sudden vision loss. °This information is not intended to replace advice given to you by your health care provider. Make sure you discuss any questions you have with your health care provider. °Document Released: 12/25/2004 Document Revised: 10/16/2015 Document Reviewed: 04/17/2015 °Elsevier Interactive Patient Education © 2017 [employer] ° ° ° ° °General Anesthesia, Adult, Care After °These instructions provide you with information about caring for yourself after your procedure. Your health care provider may also give you more specific instructions. Your treatment has been planned according to current medical practices, but problems sometimes occur. Call your health care provider if you have any problems or questions after your procedure. °What can I expect after the procedure? °After the procedure, it is common to have: °· Vomiting. °· A sore throat. °· Mental slowness. °It is common to feel: °· Nauseous. °· Cold or shivery. °· Sleepy. °· Tired. °· Sore or achy, even in parts of your body where you did not have surgery. °Follow these instructions at   home: °For at least 24 hours after the procedure:  °· Do not: °¨ Participate in activities where you could fall or become injured. °¨ Drive. °¨ Use heavy machinery. °¨ Drink alcohol. °¨ Take sleeping pills or medicines that cause drowsiness. °¨ Make important decisions or sign legal documents. °¨ Take care of children on your own. °· Rest. °Eating and drinking  °· If you vomit, drink  water, juice, or soup when you can drink without vomiting. °· Drink enough fluid to keep your urine clear or pale yellow. °· Make sure you have little or no nausea before eating solid foods. °· Follow the diet recommended by your health care provider. °General instructions  °· Have a responsible adult stay with you until you are awake and alert. °· Return to your normal activities as told by your health care provider. Ask your health care provider what activities are safe for you. °· Take over-the-counter and prescription medicines only as told by your health care provider. °· If you smoke, do not smoke without supervision. °· Keep all follow-up visits as told by your health care provider. This is important. °Contact a health care provider if: °· You continue to have nausea or vomiting at home, and medicines are not helpful. °· You cannot drink fluids or start eating again. °· You cannot urinate after 8-12 hours. °· You develop a skin rash. °· You have fever. °· You have increasing redness at the site of your procedure. °Get help right away if: °· You have difficulty breathing. °· You have chest pain. °· You have unexpected bleeding. °· You feel that you are having a life-threatening or urgent problem. °This information is not intended to replace advice given to you by your health care provider. Make sure you discuss any questions you have with your health care provider. °Document Released: 09/13/2000 Document Revised: 11/10/2015 Document Reviewed: 05/22/2015 °Elsevier Interactive Patient Education © 2017 [employer] ° °

## 2016-11-17 ENCOUNTER — Ambulatory Visit
Admission: RE | Admit: 2016-11-17 | Discharge: 2016-11-17 | Disposition: A | Discharge status: Home / Self Care | Payer: Medicare HMO | Source: Ambulatory Visit | Attending: Ophthalmology | Admitting: Ophthalmology

## 2016-11-17 ENCOUNTER — Ambulatory Visit: Payer: Medicare HMO | Admitting: Anesthesiology

## 2016-11-17 ENCOUNTER — Encounter
Admission: RE | Disposition: A | Discharge status: Home / Self Care | Payer: Self-pay | Source: Ambulatory Visit | Attending: Ophthalmology

## 2016-11-17 DIAGNOSIS — Z7982 Long term (current) use of aspirin: Secondary | ICD-10-CM | POA: Insufficient documentation

## 2016-11-17 DIAGNOSIS — R011 Cardiac murmur, unspecified: Secondary | ICD-10-CM | POA: Diagnosis not present

## 2016-11-17 DIAGNOSIS — M109 Gout, unspecified: Secondary | ICD-10-CM | POA: Insufficient documentation

## 2016-11-17 DIAGNOSIS — Z79899 Other long term (current) drug therapy: Secondary | ICD-10-CM | POA: Diagnosis not present

## 2016-11-17 DIAGNOSIS — H2511 Age-related nuclear cataract, right eye: Secondary | ICD-10-CM | POA: Insufficient documentation

## 2016-11-17 DIAGNOSIS — J45909 Unspecified asthma, uncomplicated: Secondary | ICD-10-CM | POA: Insufficient documentation

## 2016-11-17 DIAGNOSIS — I10 Essential (primary) hypertension: Secondary | ICD-10-CM | POA: Diagnosis not present

## 2016-11-17 DIAGNOSIS — M1990 Unspecified osteoarthritis, unspecified site: Secondary | ICD-10-CM | POA: Diagnosis not present

## 2016-11-17 DIAGNOSIS — E119 Type 2 diabetes mellitus without complications: Secondary | ICD-10-CM | POA: Diagnosis not present

## 2016-11-17 DIAGNOSIS — H2513 Age-related nuclear cataract, bilateral: Secondary | ICD-10-CM | POA: Diagnosis not present

## 2016-11-17 DIAGNOSIS — I499 Cardiac arrhythmia, unspecified: Secondary | ICD-10-CM | POA: Diagnosis not present

## 2016-11-17 HISTORY — DX: Cardiac murmur, unspecified: R01.1

## 2016-11-17 HISTORY — PX: CATARACT EXTRACTION W/PHACO: SHX586

## 2016-11-17 HISTORY — DX: Unspecified osteoarthritis, unspecified site: M19.90

## 2016-11-17 HISTORY — DX: Presence of dental prosthetic device (complete) (partial): Z97.2

## 2016-11-17 SURGERY — PHACOEMULSIFICATION, CATARACT, WITH IOL INSERTION
Anesthesia: Monitor Anesthesia Care | Laterality: Right | Wound class: Clean

## 2016-11-17 MED ORDER — CEFUROXIME OPHTHALMIC INJECTION 1 MG/0.1 ML
INJECTION | OPHTHALMIC | Status: DC | PRN
  Administered 2016-11-17: .3 mL via INTRACAMERAL

## 2016-11-17 MED ORDER — ARMC OPHTHALMIC DILATING DROPS
1.0000 "application " | OPHTHALMIC | Status: DC | PRN
  Administered 2016-11-17 (×3): 1 via OPHTHALMIC

## 2016-11-17 MED ORDER — ACETAMINOPHEN 325 MG PO TABS: 325.0000 mg | ORAL_TABLET | ORAL | Status: DC | PRN

## 2016-11-17 MED ORDER — ACETAMINOPHEN 160 MG/5ML PO SOLN: 325.0000 mg | ORAL | Status: DC | PRN

## 2016-11-17 MED ORDER — NA HYALUR & NA CHOND-NA HYALUR 0.4-0.35 ML IO KIT
PACK | INTRAOCULAR | Status: DC | PRN
  Administered 2016-11-17: 1 mL via INTRAOCULAR

## 2016-11-17 MED ORDER — MIDAZOLAM HCL 2 MG/2ML IJ SOLN
INTRAMUSCULAR | Status: DC | PRN
  Administered 2016-11-17 (×2): 1 mg via INTRAVENOUS
  Administered 2016-11-17: 2 mg via INTRAVENOUS

## 2016-11-17 MED ORDER — MOXIFLOXACIN HCL 0.5 % OP SOLN
1.0000 [drp] | OPHTHALMIC | Status: DC | PRN
  Administered 2016-11-17 (×3): 1 [drp] via OPHTHALMIC

## 2016-11-17 MED ORDER — EPINEPHRINE PF 1 MG/ML IJ SOLN
INTRAMUSCULAR | Status: DC | PRN
  Administered 2016-11-17: 65 mL via OPHTHALMIC

## 2016-11-17 MED ORDER — FENTANYL CITRATE (PF) 100 MCG/2ML IJ SOLN
INTRAMUSCULAR | Status: DC | PRN
  Administered 2016-11-17: 100 ug via INTRAVENOUS

## 2016-11-17 MED ORDER — LIDOCAINE HCL (PF) 2 % IJ SOLN
INTRAOCULAR | Status: DC | PRN
  Administered 2016-11-17: 1 mL via INTRAOCULAR

## 2016-11-17 MED ORDER — BRIMONIDINE TARTRATE-TIMOLOL 0.2-0.5 % OP SOLN
OPHTHALMIC | Status: DC | PRN
  Administered 2016-11-17: 1 [drp] via OPHTHALMIC

## 2016-11-17 MED ORDER — LACTATED RINGERS IV SOLN
INTRAVENOUS | Status: DC
Start: 2016-11-17 — End: 2016-11-17

## 2016-11-17 SURGICAL SUPPLY — 25 items
CANNULA ANT/CHMB 27GA (MISCELLANEOUS) ×3 IMPLANT
CARTRIDGE ABBOTT (MISCELLANEOUS) IMPLANT
GLOVE SURG LX 7.5 STRW (GLOVE) ×2
GLOVE SURG LX STRL 7.5 STRW (GLOVE) ×1 IMPLANT
GLOVE SURG TRIUMPH 8.0 PF LTX (GLOVE) ×3 IMPLANT
GOWN STRL REUS W/ TWL LRG LVL3 (GOWN DISPOSABLE) ×2 IMPLANT
GOWN STRL REUS W/TWL LRG LVL3 (GOWN DISPOSABLE) ×4
LENS IOL TECNIS ITEC 18.5 (Intraocular Lens) ×3 IMPLANT
MARKER SKIN DUAL TIP RULER LAB (MISCELLANEOUS) ×3 IMPLANT
NDL RETROBULBAR .5 NSTRL (NEEDLE) IMPLANT
NEEDLE FILTER BLUNT 18X 1/2SAF (NEEDLE) ×2
NEEDLE FILTER BLUNT 18X1 1/2 (NEEDLE) ×1 IMPLANT
PACK CATARACT BRASINGTON (MISCELLANEOUS) ×3 IMPLANT
PACK EYE AFTER SURG (MISCELLANEOUS) ×3 IMPLANT
PACK OPTHALMIC (MISCELLANEOUS) ×3 IMPLANT
RING MALYGIN 7.0 (MISCELLANEOUS) IMPLANT
SUT ETHILON 10-0 CS-B-6CS-B-6 (SUTURE)
SUT VICRYL  9 0 (SUTURE)
SUT VICRYL 9 0 (SUTURE) IMPLANT
SUTURE EHLN 10-0 CS-B-6CS-B-6 (SUTURE) IMPLANT
SYR 3ML LL SCALE MARK (SYRINGE) ×3 IMPLANT
SYR 5ML LL (SYRINGE) ×3 IMPLANT
SYR TB 1ML LUER SLIP (SYRINGE) ×3 IMPLANT
WATER STERILE IRR 250ML POUR (IV SOLUTION) ×3 IMPLANT
WIPE NON LINTING 3.25X3.25 (MISCELLANEOUS) ×3 IMPLANT

## 2016-11-17 NOTE — Anesthesia Preprocedure Evaluation (Signed)
Anesthesia Evaluation  Patient identified by MRN, date of birth, ID band Patient awake    Reviewed: Allergy & Precautions, H&P , NPO status , Patient's Chart, lab work & pertinent test results  Airway Mallampati: II  TM Distance: >3 FB Neck ROM: full    Dental  (+) Upper Dentures, Lower Dentures   Pulmonary asthma ,    Pulmonary exam normal        Cardiovascular hypertension, Normal cardiovascular exam+ Valvular Problems/Murmurs      Neuro/Psych    GI/Hepatic   Endo/Other  diabetes, Type 2  Renal/GU Renal disease     Musculoskeletal   Abdominal   Peds  Hematology   Anesthesia Other Findings   Reproductive/Obstetrics                             Anesthesia Physical Anesthesia Plan  ASA: III  Anesthesia Plan: MAC   Post-op Pain Management:    Induction:   Airway Management Planned:   Additional Equipment:   Intra-op Plan:   Post-operative Plan:   Informed Consent: I have reviewed the patients History and Physical, chart, labs and discussed the procedure including the risks, benefits and alternatives for the proposed anesthesia with the patient or authorized representative who has indicated his/her understanding and acceptance.     Plan Discussed with:   Anesthesia Plan Comments:         Anesthesia Quick Evaluation

## 2016-11-17 NOTE — Transfer of Care (Signed)
Immediate Anesthesia Transfer of Care Note  Patient: [name] [name]  Procedure(s) Performed: Procedure(s): CATARACT EXTRACTION PHACO AND INTRAOCULAR LENS PLACEMENT (IOC)  Right (Right)  Patient Location: PACU  Anesthesia Type: MAC  Level of Consciousness: awake, alert  and patient cooperative  Airway and Oxygen Therapy: Patient Spontanous Breathing and Patient connected to supplemental oxygen  Post-op Assessment: Post-op Vital signs reviewed, Patient's Cardiovascular Status Stable, Respiratory Function Stable, Patent Airway and No signs of Nausea or vomiting  Post-op Vital Signs: Reviewed and stable  Complications: No apparent anesthesia complications

## 2016-11-17 NOTE — H&P (Signed)
The History and Physical notes are on paper, have been signed, and are to be scanned. The patient remains stable and unchanged from the H&P.   Previous H&P reviewed, patient examined, and there are no changes.  [name] [name] 11/17/2016 9:19 AM

## 2016-11-17 NOTE — Anesthesia Procedure Notes (Signed)
Procedure Name: MAC Date/Time: 11/17/2016 9:46 AM Performed by: Janna Arch Pre-anesthesia Checklist: Patient identified, Emergency Drugs available and Suction available Patient Re-evaluated:Patient Re-evaluated prior to inductionOxygen Delivery Method: Nasal cannula

## 2016-11-17 NOTE — Op Note (Signed)
LOCATION:  [facility]   PREOPERATIVE DIAGNOSIS:    Nuclear sclerotic cataract right eye. H25.11   POSTOPERATIVE DIAGNOSIS:  Nuclear sclerotic cataract right eye.     PROCEDURE:  Phacoemusification with posterior chamber intraocular lens placement of the right eye   LENS:   Implant Name Type Inv. Item Serial No. Manufacturer Lot No. LRB No. Used  LENS IOL DIOP 18.5 - [id number] Intraocular Lens LENS IOL DIOP 18.5 [id number] AMO   Right 1        ULTRASOUND TIME: 15 % of 1 minutes, 18 seconds.  CDE 12.0   SURGEON:  Wyonia Hough, MD   ANESTHESIA:  Topical with tetracaine drops and 2% Xylocaine jelly, augmented with 1% preservative-free intracameral lidocaine.    COMPLICATIONS:  None.   DESCRIPTION OF PROCEDURE:  The patient was identified in the holding room and transported to the operating room and placed in the supine position under the operating microscope.  The right eye was identified as the operative eye and it was prepped and draped in the usual sterile ophthalmic fashion.   A 1 millimeter clear-corneal paracentesis was made at the 12:00 position.  0.5 ml of preservative-free 1% lidocaine was injected into the anterior chamber. The anterior chamber was filled with Viscoat viscoelastic.  A 2.4 millimeter keratome was used to make a near-clear corneal incision at the 9:00 position.  A curvilinear capsulorrhexis was made with a cystotome and capsulorrhexis forceps.  Balanced salt solution was used to hydrodissect and hydrodelineate the nucleus.   Phacoemulsification was then used in stop and chop fashion to remove the lens nucleus and epinucleus.  The remaining cortex was then removed using the irrigation and aspiration handpiece. Provisc was then placed into the capsular bag to distend it for lens placement.  A lens was then injected into the capsular bag.  The remaining viscoelastic was aspirated.   Wounds were hydrated with balanced salt solution.  The anterior  chamber was inflated to a physiologic pressure with balanced salt solution.  No wound leaks were noted. Cefuroxime 0.1 ml of a 10mg /ml solution was injected into the anterior chamber for a dose of 1 mg of intracameral antibiotic at the completion of the case.   Timolol and Brimonidine drops were applied to the eye.  The patient was taken to the recovery room in stable condition without complications of anesthesia or surgery.   Dayron Odland 11/17/2016, 10:13 AM \

## 2016-11-17 NOTE — Anesthesia Postprocedure Evaluation (Signed)
Anesthesia Post Note  Patient: [name] [name]  Procedure(s) Performed: Procedure(s) (LRB): CATARACT EXTRACTION PHACO AND INTRAOCULAR LENS PLACEMENT (IOC)  Right (Right)  Patient location during evaluation: PACU Anesthesia Type: MAC Level of consciousness: awake and alert and oriented Pain management: satisfactory to patient Vital Signs Assessment: post-procedure vital signs reviewed and stable Respiratory status: spontaneous breathing, nonlabored ventilation and respiratory function stable Cardiovascular status: blood pressure returned to baseline and stable Postop Assessment: Adequate PO intake and No signs of nausea or vomiting Anesthetic complications: no    Raliegh Ip

## 2016-11-18 ENCOUNTER — Encounter: Payer: Self-pay | Admitting: Ophthalmology

## 2016-11-22 ENCOUNTER — Other Ambulatory Visit: Payer: Medicare HMO

## 2017-02-01 ENCOUNTER — Other Ambulatory Visit: Payer: Self-pay | Admitting: Internal Medicine

## 2017-03-18 ENCOUNTER — Encounter: Payer: Self-pay | Admitting: Internal Medicine

## 2017-03-18 ENCOUNTER — Ambulatory Visit (INDEPENDENT_AMBULATORY_CARE_PROVIDER_SITE_OTHER): Payer: Medicare HMO | Admitting: Internal Medicine

## 2017-03-18 DIAGNOSIS — Z6833 Body mass index (BMI) 33.0-33.9, adult: Secondary | ICD-10-CM | POA: Diagnosis not present

## 2017-03-18 DIAGNOSIS — R972 Elevated prostate specific antigen [PSA]: Secondary | ICD-10-CM

## 2017-03-18 DIAGNOSIS — E7800 Pure hypercholesterolemia, unspecified: Secondary | ICD-10-CM

## 2017-03-18 DIAGNOSIS — E119 Type 2 diabetes mellitus without complications: Secondary | ICD-10-CM | POA: Diagnosis not present

## 2017-03-18 DIAGNOSIS — Z23 Encounter for immunization: Secondary | ICD-10-CM

## 2017-03-18 DIAGNOSIS — N289 Disorder of kidney and ureter, unspecified: Secondary | ICD-10-CM | POA: Diagnosis not present

## 2017-03-18 DIAGNOSIS — I10 Essential (primary) hypertension: Secondary | ICD-10-CM

## 2017-03-18 NOTE — Assessment & Plan Note (Signed)
Diet and exercise.  Follow.  

## 2017-03-18 NOTE — Progress Notes (Signed)
Patient ID: [name] [name], male   DOB: 10-19-1930, 83 y.o.   MRN: [id number]   Subjective:    Patient ID: [name] [name], male    DOB: 12-30-1930, 83 y.o.   MRN: [id number]  HPI  Patient here for a scheduled follow up.  States he is feeling good.  Doing well.  Staying active.  No chest pain.  No sob.  No acid reflux.  No abdominal pain.  Bowels moving.  Does not check sugars regularly.     Past Medical History:  Diagnosis Date  . Arthritis    right shoulder  . Asthma   . BPH (benign prostatic hypertrophy)   . Gout   . Heart murmur   . History of chicken pox   . Hypercholesterolemia   . Hypertension   . Wears dentures    full upper and lower   Past Surgical History:  Procedure Laterality Date  . arm surgery     right arm fx s/p "plate insertion"  . CATARACT EXTRACTION W/PHACO Right 11/17/2016   Procedure: CATARACT EXTRACTION PHACO AND INTRAOCULAR LENS PLACEMENT (IOC)  Right;  Surgeon: Lockie Mola, MD;  [facility];  Service: Ophthalmology;  Laterality: Right;  . DENTAL SURGERY     all teeth extracted   Family History  Problem Relation Age of Onset  . Leukemia Father   . Congestive Heart Failure Mother   . Cancer Daughter        Breast Cancer  . Prostate cancer Neg Hx   . Colon cancer Neg Hx    Social History   Social History  . Marital status: Married    Spouse name: N/A  . Number of children: N/A  . Years of education: N/A   Social History Main Topics  . Smoking status: Never Smoker  . Smokeless tobacco: Current User    Types: Chew  . Alcohol use No  . Drug use: No  . Sexual activity: Not Currently   Other Topics Concern  . None   Social History Narrative  . None    Outpatient Encounter Prescriptions as of 03/18/2017  Medication Sig  . allopurinol (ZYLOPRIM) 100 MG tablet TAKE 1 TABLET EVERY DAY  . amLODipine (NORVASC) 5 MG tablet TAKE 1 TABLET EVERY DAY  . aspirin 81 MG tablet Take 81 mg by mouth daily.  Marland Kitchen  atorvastatin (LIPITOR) 10 MG tablet TAKE 1 TABLET EVERY DAY  . colchicine (COLCRYS) 0.6 MG tablet Take 1 tablet (0.6 mg total) by mouth 2 (two) times daily as needed.  . hydrochlorothiazide (MICROZIDE) 12.5 MG capsule TAKE 1 CAPSULE EVERY DAY  . lisinopril (PRINIVIL,ZESTRIL) 40 MG tablet Take 1 tablet (40 mg total) by mouth daily.  Marland Kitchen LORazepam (ATIVAN) 1 MG tablet Takes 1/2 to 1 tablet q day prn  . metoprolol succinate (TOPROL-XL) 50 MG 24 hr tablet Take 1 tablet (50 mg total) by mouth 2 (two) times daily. Take with or immediately following a meal.  . Multiple Vitamin (MULTIVITAMIN) capsule Take 1 capsule by mouth daily.   No facility-administered encounter medications on file as of 03/18/2017.     Review of Systems  Constitutional: Negative for appetite change and unexpected weight change.  HENT: Negative for congestion and sinus pressure.   Respiratory: Negative for cough, chest tightness and shortness of breath.   Cardiovascular: Negative for chest pain, palpitations and leg swelling.  Gastrointestinal: Negative for abdominal pain, diarrhea, nausea and vomiting.  Genitourinary: Negative for difficulty urinating and dysuria.  Musculoskeletal: Negative  for back pain and joint swelling.  Skin: Negative for color change and rash.  Neurological: Negative for dizziness and light-headedness.  Psychiatric/Behavioral: Negative for agitation and dysphoric mood.       Objective:    Physical Exam  Constitutional: He appears well-developed and well-nourished. No distress.  HENT:  Nose: Nose normal.  Mouth/Throat: Oropharynx is clear and moist.  Neck: Neck supple. No thyromegaly present.  Cardiovascular: Normal rate and regular rhythm.   Pulmonary/Chest: Effort normal and breath sounds normal. No respiratory distress.  Abdominal: Soft. Bowel sounds are normal. There is no tenderness.  Musculoskeletal: He exhibits no edema or tenderness.  Lymphadenopathy:    He has no cervical adenopathy.    Skin: No rash noted. No erythema.  Psychiatric: He has a normal mood and affect. His behavior is normal.    BP 136/70 (BP Location: Left Arm, Patient Position: Sitting, Cuff Size: Large)   Pulse 67   Temp 97.8 F (36.6 C) (Oral)   Resp 12   Ht '5\' 10"'$  (1.778 m)   Wt 232 lb 6.4 oz (105.4 kg)   SpO2 98%   BMI 33.35 kg/m  Wt Readings from Last 3 Encounters:  03/18/17 232 lb 6.4 oz (105.4 kg)  11/17/16 235 lb (106.6 kg)  11/11/16 237 lb 9.6 oz (107.8 kg)     Lab Results  Component Value Date   WBC 12.0 (H) 11/11/2016   HGB 14.9 11/11/2016   HCT 44.0 11/11/2016   PLT 196.0 11/11/2016   GLUCOSE 130 (H) 11/11/2016   CHOL 119 05/06/2016   TRIG 50.0 05/06/2016   HDL 38.10 (L) 05/06/2016   LDLCALC 71 05/06/2016   ALT 16 11/11/2016   AST 21 11/11/2016   NA 137 11/11/2016   K 3.9 11/11/2016   CL 101 11/11/2016   CREATININE 1.42 11/11/2016   BUN 20 11/11/2016   CO2 28 11/11/2016   TSH 3.29 11/11/2016   PSA 4.15 (H) 02/08/2014   HGBA1C 6.3 11/11/2016   MICROALBUR 8.7 (H) 05/06/2016       Assessment & Plan:   Problem List Items Addressed This Visit    BMI 33.0-33.9,adult    Diet and exercise.  Follow.        Diabetes ([place])    Low carb diet and exercise.  Has lost weight.  Eating regularly.  Feels weight loss is related to increased exercise/activity.  Follow met b and a1c.        Relevant Orders   Hemoglobin F8H   Basic metabolic panel   Elevated PSA    H/o elevated psa.  Declines further lab checks or further w/up.        Hypercholesterolemia    On lipitor.  Low cholesterol diet and exercise.  Follow lipid panel and liver function tests.        Hypertension    Blood pressure as outlined.  Same medication regimen.  Follow pressures.  Follow metabolic panel.        Renal insufficiency    Creatinine slightly increased on last check.  Stay hydrated.  Avoid antiinflammatories.  Recheck metabolic panel.         Other Visit Diagnoses    Encounter for  immunization       Relevant Orders   Flu vaccine HIGH DOSE PF (Completed)       Einar Pheasant, MD

## 2017-03-18 NOTE — Assessment & Plan Note (Signed)
Blood pressure as outlined.  Same medication regimen.  Follow pressures.  Follow metabolic panel.  

## 2017-03-18 NOTE — Assessment & Plan Note (Signed)
Creatinine slightly increased on last check.  Stay hydrated.  Avoid antiinflammatories.  Recheck metabolic panel.

## 2017-03-18 NOTE — Assessment & Plan Note (Signed)
H/o elevated psa.  Declines further lab checks or further w/up.

## 2017-03-18 NOTE — Assessment & Plan Note (Signed)
Low carb diet and exercise.  Has lost weight.  Eating regularly.  Feels weight loss is related to increased exercise/activity.  Follow met b and a1c.

## 2017-03-18 NOTE — Assessment & Plan Note (Signed)
On lipitor.  Low cholesterol diet and exercise.  Follow lipid panel and liver function tests.   

## 2017-03-20 ENCOUNTER — Encounter: Payer: Self-pay | Admitting: Internal Medicine

## 2017-03-28 ENCOUNTER — Other Ambulatory Visit (INDEPENDENT_AMBULATORY_CARE_PROVIDER_SITE_OTHER): Payer: Medicare HMO

## 2017-03-28 DIAGNOSIS — E119 Type 2 diabetes mellitus without complications: Secondary | ICD-10-CM | POA: Diagnosis not present

## 2017-03-28 LAB — HEMOGLOBIN A1C: Hgb A1c MFr Bld: 6.1 % (ref 4.6–6.5)

## 2017-03-28 LAB — BASIC METABOLIC PANEL
BUN: 20 mg/dL (ref 6–23)
CALCIUM: 10.1 mg/dL (ref 8.4–10.5)
CO2: 26 mEq/L (ref 19–32)
Chloride: 105 mEq/L (ref 96–112)
Creatinine, Ser: 1.15 mg/dL (ref 0.40–1.50)
GFR: 64.29 mL/min (ref >60.00)
GLUCOSE: 113 mg/dL — AB (ref 70–99)
Potassium: 3.8 mEq/L (ref 3.5–5.1)
SODIUM: 139 meq/L (ref 135–145)

## 2017-03-29 ENCOUNTER — Other Ambulatory Visit (INDEPENDENT_AMBULATORY_CARE_PROVIDER_SITE_OTHER): Payer: Medicare HMO

## 2017-03-29 DIAGNOSIS — D72829 Elevated white blood cell count, unspecified: Secondary | ICD-10-CM

## 2017-03-29 DIAGNOSIS — E7800 Pure hypercholesterolemia, unspecified: Secondary | ICD-10-CM

## 2017-03-29 LAB — LIPID PANEL
CHOL/HDL RATIO: 3
Cholesterol: 103 mg/dL (ref 0–200)
HDL: 35.9 mg/dL — AB (ref >39.00)
LDL CALC: 56 mg/dL (ref 0–99)
NonHDL: 66.82
TRIGLYCERIDES: 56 mg/dL (ref 0.0–149.0)
VLDL: 11.2 mg/dL (ref 0.0–40.0)

## 2017-03-29 LAB — CBC WITH DIFFERENTIAL/PLATELET
BASOS ABS: 0.2 10*3/uL — AB (ref 0.0–0.1)
Basophils Relative: 2 % (ref 0.0–3.0)
EOS ABS: 0.5 10*3/uL (ref 0.0–0.7)
Eosinophils Relative: 5.5 % — ABNORMAL HIGH (ref 0.0–5.0)
HEMATOCRIT: 42.2 % (ref 39.0–52.0)
HEMOGLOBIN: 14.2 g/dL (ref 13.0–17.0)
LYMPHS PCT: 41.1 % (ref 12.0–46.0)
Lymphs Abs: 3.7 10*3/uL (ref 0.7–4.0)
MCHC: 33.7 g/dL (ref 30.0–36.0)
MCV: 99 fl (ref 78.0–100.0)
MONOS PCT: 7.8 % (ref 3.0–12.0)
Monocytes Absolute: 0.7 10*3/uL (ref 0.1–1.0)
Neutro Abs: 4 10*3/uL (ref 1.4–7.7)
Neutrophils Relative %: 43.6 % (ref 43.0–77.0)
PLATELETS: 186 10*3/uL (ref 150.0–400.0)
RBC: 4.26 Mil/uL (ref 4.22–5.81)
RDW: 14 % (ref 11.5–15.5)
WBC: 9.1 10*3/uL (ref 4.0–10.5)

## 2017-05-20 LAB — HM DIABETES EYE EXAM

## 2017-06-20 ENCOUNTER — Other Ambulatory Visit: Payer: Self-pay | Admitting: Internal Medicine

## 2017-07-20 ENCOUNTER — Ambulatory Visit (INDEPENDENT_AMBULATORY_CARE_PROVIDER_SITE_OTHER): Payer: Medicare HMO | Admitting: Internal Medicine

## 2017-07-20 ENCOUNTER — Encounter: Payer: Self-pay | Admitting: Internal Medicine

## 2017-07-20 DIAGNOSIS — E119 Type 2 diabetes mellitus without complications: Secondary | ICD-10-CM | POA: Diagnosis not present

## 2017-07-20 DIAGNOSIS — N289 Disorder of kidney and ureter, unspecified: Secondary | ICD-10-CM | POA: Diagnosis not present

## 2017-07-20 DIAGNOSIS — M1A9XX0 Chronic gout, unspecified, without tophus (tophi): Secondary | ICD-10-CM | POA: Diagnosis not present

## 2017-07-20 DIAGNOSIS — E7800 Pure hypercholesterolemia, unspecified: Secondary | ICD-10-CM | POA: Diagnosis not present

## 2017-07-20 DIAGNOSIS — Z6833 Body mass index (BMI) 33.0-33.9, adult: Secondary | ICD-10-CM | POA: Diagnosis not present

## 2017-07-20 DIAGNOSIS — R972 Elevated prostate specific antigen [PSA]: Secondary | ICD-10-CM

## 2017-07-20 DIAGNOSIS — I10 Essential (primary) hypertension: Secondary | ICD-10-CM | POA: Diagnosis not present

## 2017-07-20 MED ORDER — COLCHICINE 0.6 MG PO TABS: 0.6000 mg | ORAL_TABLET | Freq: Two times a day (BID) | ORAL | 0 refills | Status: DC | PRN

## 2017-07-20 NOTE — Progress Notes (Addendum)
Patient ID: BYRON PEACOCK, male   DOB: 1930-07-25, 84 y.o.   MRN: [id number]   Subjective:    Patient ID: [name], male    DOB: 1930/09/25, 84 y.o.   MRN: [id number]  HPI  Patient here for a scheduled follow up.  He reports he is doing well.  Feels good.  Stays active.  Has not been able to get outside as much recently secondary to the weather.  Still tries to stay active.  No chest pain.  No sob. No acid reflux.  No abdominal pain.  Bowels moving.  Had recent gout flare.  Did not take his colchicine initially, so this attack lasted longer than previous.  Does not flare often.     Past Medical History:  Diagnosis Date  . Arthritis    right shoulder  . Asthma   . BPH (benign prostatic hypertrophy)   . Gout   . Heart murmur   . History of chicken pox   . Hypercholesterolemia   . Hypertension   . Wears dentures    full upper and lower   Past Surgical History:  Procedure Laterality Date  . arm surgery     right arm fx s/p "plate insertion"  . CATARACT EXTRACTION W/PHACO Right 11/17/2016   Procedure: CATARACT EXTRACTION PHACO AND INTRAOCULAR LENS PLACEMENT ([place])  Right;  Surgeon: Leandrew Koyanagi, MD;  Location: [facility];  Service: Ophthalmology;  Laterality: Right;  . DENTAL SURGERY     all teeth extracted   Family History  Problem Relation Age of Onset  . Leukemia Father   . Congestive Heart Failure Mother   . Cancer Daughter        Breast Cancer  . Prostate cancer Neg Hx   . Colon cancer Neg Hx    Social History   Socioeconomic History  . Marital status: Married    Spouse name: None  . Number of children: None  . Years of education: None  . Highest education level: None  Social Needs  . Financial resource strain: None  . Food insecurity - worry: None  . Food insecurity - inability: None  . Transportation needs - medical: None  . Transportation needs - non-medical: None  Occupational History  . None  Tobacco Use  . Smoking status:  Never Smoker  . Smokeless tobacco: Current User    Types: Chew  Substance and Sexual Activity  . Alcohol use: No    Alcohol/week: 0.0 oz  . Drug use: No  . Sexual activity: Not Currently  Other Topics Concern  . None  Social History Narrative  . None    Outpatient Encounter Medications as of 07/20/2017  Medication Sig  . allopurinol (ZYLOPRIM) 100 MG tablet TAKE 1 TABLET EVERY DAY  . amLODipine (NORVASC) 5 MG tablet TAKE 1 TABLET EVERY DAY  . aspirin 81 MG tablet Take 81 mg by mouth daily.  Marland Kitchen atorvastatin (LIPITOR) 10 MG tablet TAKE 1 TABLET EVERY DAY  . colchicine (COLCRYS) 0.6 MG tablet Take 1 tablet (0.6 mg total) by mouth 2 (two) times daily as needed.  . hydrochlorothiazide (MICROZIDE) 12.5 MG capsule TAKE 1 CAPSULE EVERY DAY  . lisinopril (PRINIVIL,ZESTRIL) 40 MG tablet Take 1 tablet (40 mg total) by mouth daily.  Marland Kitchen LORazepam (ATIVAN) 1 MG tablet Takes 1/2 to 1 tablet q day prn  . metoprolol succinate (TOPROL-XL) 50 MG 24 hr tablet Take 1 tablet (50 mg total) by mouth 2 (two) times daily. Take with or immediately following  a meal.  . [DISCONTINUED] colchicine (COLCRYS) 0.6 MG tablet Take 1 tablet (0.6 mg total) by mouth 2 (two) times daily as needed.  . [DISCONTINUED] Multiple Vitamin (MULTIVITAMIN) capsule Take 1 capsule by mouth daily.   No facility-administered encounter medications on file as of 07/20/2017.     Review of Systems  Constitutional: Negative for appetite change and unexpected weight change.  HENT: Negative for congestion and sinus pressure.   Respiratory: Negative for cough, chest tightness and shortness of breath.   Cardiovascular: Negative for chest pain, palpitations and leg swelling.  Gastrointestinal: Negative for abdominal pain, diarrhea, nausea and vomiting.  Genitourinary: Negative for difficulty urinating and dysuria.  Musculoskeletal: Negative for myalgias.       Previous gout flare.  Resolved now.   Skin: Negative for color change and rash.    Neurological: Negative for dizziness, light-headedness and headaches.  Psychiatric/Behavioral: Negative for agitation and dysphoric mood.       Objective:     Blood pressure rechecked by me:  136/62  Physical Exam  Constitutional: He appears well-developed and well-nourished. No distress.  HENT:  Nose: Nose normal.  Mouth/Throat: Oropharynx is clear and moist.  Neck: Neck supple. No thyromegaly present.  Cardiovascular: Normal rate and regular rhythm.  Pulmonary/Chest: Effort normal and breath sounds normal. No respiratory distress.  Abdominal: Soft. Bowel sounds are normal. There is no tenderness.  Musculoskeletal: He exhibits no edema or tenderness.  Lymphadenopathy:    He has no cervical adenopathy.  Skin: No rash noted. No erythema.  Psychiatric: He has a normal mood and affect. His behavior is normal.    BP 138/78 (BP Location: Left Arm, Patient Position: Sitting, Cuff Size: Large)   Pulse (!) 57   Temp 98.2 F (36.8 C) (Oral)   Resp 16   Wt 235 lb (106.6 kg)   SpO2 97%   BMI 33.72 kg/m  Wt Readings from Last 3 Encounters:  07/20/17 235 lb (106.6 kg)  03/18/17 232 lb 6.4 oz (105.4 kg)  11/17/16 235 lb (106.6 kg)     Lab Results  Component Value Date   WBC 9.1 03/29/2017   HGB 14.2 03/29/2017   HCT 42.2 03/29/2017   PLT 186.0 03/29/2017   GLUCOSE 113 (H) 03/28/2017   CHOL 103 03/29/2017   TRIG 56.0 03/29/2017   HDL 35.90 (L) 03/29/2017   LDLCALC 56 03/29/2017   ALT 16 11/11/2016   AST 21 11/11/2016   NA 139 03/28/2017   K 3.8 03/28/2017   CL 105 03/28/2017   CREATININE 1.15 03/28/2017   BUN 20 03/28/2017   CO2 26 03/28/2017   TSH 3.29 11/11/2016   PSA 4.15 (H) 02/08/2014   HGBA1C 6.1 03/28/2017   MICROALBUR 8.7 (H) 05/06/2016       Assessment & Plan:   Problem List Items Addressed This Visit    BMI 33.0-33.9,adult    Diet and exercise.  Follow.       Diabetes ([place])    Low carb diet and exercise.  Follow met b and a1c.  Up to date with  eye checks.        Relevant Orders   Hemoglobin V7O   Basic metabolic panel   Microalbumin / creatinine urine ratio   Elevated PSA    Declines any further f/u or lab testing or evaluation.       Gout    On allopurinol.  Has colchicine for flares.  Follow. No pain now.       Hypercholesterolemia  On lipitor.  Low cholesterol diet and exercise.  Follow lipid panel and liver function tests.        Relevant Orders   Hepatic function panel   Lipid panel   Hypertension    Blood pressure under good control.  Continue same medication regimen.  Follow pressures.  Follow metabolic panel.        Renal insufficiency    Creatinine has been stable overall.  Stay hydrated.  Avoid antiinflammatories.  Follow met b.           Einar Pheasant, MD

## 2017-07-23 ENCOUNTER — Encounter: Payer: Self-pay | Admitting: Internal Medicine

## 2017-07-23 NOTE — Assessment & Plan Note (Signed)
Low carb diet and exercise.  Follow met b and a1c.  Up to date with eye checks.

## 2017-07-23 NOTE — Assessment & Plan Note (Signed)
On lipitor.  Low cholesterol diet and exercise.  Follow lipid panel and liver function tests.   

## 2017-07-23 NOTE — Assessment & Plan Note (Signed)
Creatinine has been stable overall.  Stay hydrated.  Avoid antiinflammatories.  Follow met b.

## 2017-07-23 NOTE — Assessment & Plan Note (Signed)
Diet and exercise.  Follow.  

## 2017-07-23 NOTE — Addendum Note (Signed)
Addended by: Alisa Graff on: 07/23/2017 07:30 AM   Modules accepted: Orders

## 2017-07-23 NOTE — Assessment & Plan Note (Signed)
On allopurinol.  Has colchicine for flares.  Follow. No pain now.

## 2017-07-23 NOTE — Assessment & Plan Note (Signed)
Declines any further f/u or lab testing or evaluation.

## 2017-07-23 NOTE — Assessment & Plan Note (Signed)
Blood pressure under good control.  Continue same medication regimen.  Follow pressures.  Follow metabolic panel.   

## 2017-09-09 DIAGNOSIS — H2512 Age-related nuclear cataract, left eye: Secondary | ICD-10-CM | POA: Diagnosis not present

## 2017-09-15 ENCOUNTER — Ambulatory Visit (INDEPENDENT_AMBULATORY_CARE_PROVIDER_SITE_OTHER): Payer: Medicare HMO

## 2017-09-15 VITALS — BP 128/62 | HR 55 | Temp 97.8°F | Resp 14 | Ht 70.0 in | Wt 233.8 lb

## 2017-09-15 DIAGNOSIS — Z0000 Encounter for general adult medical examination without abnormal findings: Secondary | ICD-10-CM

## 2017-09-15 NOTE — Patient Instructions (Addendum)
  Mr. Memmer , Thank you for taking time to come for your Medicare Wellness Visit. I appreciate your ongoing commitment to your health goals. Please review the following plan we discussed and let me know if I can assist you in the future.   These are the goals we discussed: Goals    . Healthy Lifestyle     Stay hydrated Low carb foods Stretch!  Exercise.       This is a list of the screening recommended for you and due dates:  Health Maintenance  Topic Date Due  . Tetanus Vaccine  08/09/1949  . Hemoglobin A1C  09/26/2017  . Complete foot exam   11/11/2017  . Eye exam for diabetics  09/02/2018  . Flu Shot  Completed  . Pneumonia vaccines  Completed

## 2017-09-15 NOTE — Progress Notes (Addendum)
Subjective:   [name] [name] is a 84 y.o. male who presents for Medicare Annual/Subsequent preventive examination.  Review of Systems:  No ROS.  Medicare Wellness Visit. Additional risk factors are reflected in the social history. Cardiac Risk Factors include: advanced age (>39men, >41 women);hypertension;obesity (BMI >30kg/m2)     Objective:    Vitals: BP 128/62 (BP Location: Left Arm, Patient Position: Sitting, Cuff Size: Normal)   Pulse (!) 55   Temp 97.8 F (36.6 C) (Oral)   Resp 14   Ht 5\' 10"  (1.778 m)   Wt 233 lb 12.8 oz (106.1 kg)   SpO2 95%   BMI 33.55 kg/m   Body mass index is 33.55 kg/m.  Advanced Directives 09/15/2017 11/17/2016 09/14/2016 09/15/2015  Does Patient Have a Medical Advance Directive? No No No No  Would patient like information on creating a medical advance directive? No - Patient declined No - Patient declined No - Patient declined No - patient declined information    Tobacco Social History   Tobacco Use  Smoking Status Never Smoker  Smokeless Tobacco Current User  . Types: Chew     Ready to quit: Not Answered Counseling given: Not Answered   Clinical Intake:  Pre-visit preparation completed: Yes  Pain : No/denies pain     Nutritional Status: BMI > 30  Obese Diabetes: Yes  How often do you need to have someone help you when you read instructions, pamphlets, or other written materials from your doctor or pharmacy?: 1 - Never  Interpreter Needed?: No     Past Medical History:  Diagnosis Date  . Arthritis    right shoulder  . Asthma   . BPH (benign prostatic hypertrophy)   . Gout   . Heart murmur   . History of chicken pox   . Hypercholesterolemia   . Hypertension   . Wears dentures    full upper and lower   Past Surgical History:  Procedure Laterality Date  . arm surgery     right arm fx s/p "plate insertion"  . CATARACT EXTRACTION W/PHACO Right 11/17/2016   Procedure: CATARACT EXTRACTION PHACO AND INTRAOCULAR LENS  PLACEMENT ([place])  Right;  Surgeon: Leandrew Koyanagi, MD;  Location: [facility];  Service: Ophthalmology;  Laterality: Right;  . DENTAL SURGERY     all teeth extracted   Family History  Problem Relation Age of Onset  . Leukemia Father   . Congestive Heart Failure Mother   . Cancer Daughter        Breast Cancer  . Prostate cancer Neg Hx   . Colon cancer Neg Hx    Social History   Socioeconomic History  . Marital status: Married    Spouse name: Not on file  . Number of children: Not on file  . Years of education: Not on file  . Highest education level: Not on file  Occupational History  . Not on file  Social Needs  . Financial resource strain: Not on file  . Food insecurity:    Worry: Never true    Inability: Never true  . Transportation needs:    Medical: No    Non-medical: No  Tobacco Use  . Smoking status: Never Smoker  . Smokeless tobacco: Current User    Types: Chew  Substance and Sexual Activity  . Alcohol use: No    Alcohol/week: 0.0 oz  . Drug use: No  . Sexual activity: Not Currently  Lifestyle  . Physical activity:    Days per  week: 0 days    Minutes per session: Not on file  . Stress: Not at all  Relationships  . Social connections:    Talks on phone: Not on file    Gets together: Not on file    Attends religious service: Not on file    Active member of club or organization: Not on file    Attends meetings of clubs or organizations: Not on file    Relationship status: Not on file  Other Topics Concern  . Not on file  Social History Narrative  . Not on file    Outpatient Encounter Medications as of 09/15/2017  Medication Sig  . allopurinol (ZYLOPRIM) 100 MG tablet TAKE 1 TABLET EVERY DAY  . amLODipine (NORVASC) 5 MG tablet TAKE 1 TABLET EVERY DAY  . aspirin 81 MG tablet Take 81 mg by mouth daily.  Marland Kitchen atorvastatin (LIPITOR) 10 MG tablet TAKE 1 TABLET EVERY DAY  . colchicine (COLCRYS) 0.6 MG tablet Take 1 tablet (0.6 mg total) by mouth 2  (two) times daily as needed.  . hydrochlorothiazide (MICROZIDE) 12.5 MG capsule TAKE 1 CAPSULE EVERY DAY  . lisinopril (PRINIVIL,ZESTRIL) 40 MG tablet Take 1 tablet (40 mg total) by mouth daily.  Marland Kitchen LORazepam (ATIVAN) 1 MG tablet Takes 1/2 to 1 tablet q day prn  . metoprolol succinate (TOPROL-XL) 50 MG 24 hr tablet Take 1 tablet (50 mg total) by mouth 2 (two) times daily. Take with or immediately following a meal.   No facility-administered encounter medications on file as of 09/15/2017.     Activities of Daily Living In your present state of health, do you have any difficulty performing the following activities: 09/15/2017 11/17/2016  Hearing? N N  Vision? N N  Difficulty concentrating or making decisions? N N  Walking or climbing stairs? N N  Dressing or bathing? N N  Doing errands, shopping? N -  Preparing Food and eating ? N -  Using the Toilet? N -  In the past six months, have you accidently leaked urine? N -  Do you have problems with loss of bowel control? N -  Managing your Medications? N -  Housekeeping or managing your Housekeeping? Y -  Comment Wife assists -  Some recent data might be hidden    Patient Care Team: Einar Pheasant, MD as PCP - General (Internal Medicine)   Assessment:   This is a routine wellness examination for [name]. The goal of the wellness visit is to assist the patient how to close the gaps in care and create a preventative care plan for the patient.   The roster of all physicians providing medical care to patient is listed in the Snapshot section of the chart.  Osteoporosis risk reviewed.    Safety issues reviewed; Smoke and carbon monoxide detectors in the home.Firearms locked in a safe within the home. Wears seatbelts when driving or riding with others. No violence in the home.  They do not have excessive sun exposure.  Discussed the need for sun protection: hats, long sleeves and the use of sunscreen if there is significant sun  exposure.  Patient is alert, normal appearance, oriented to person/place/and time.  Correctly identified the president of the [place] and recalls of 3/3 words. Performs simple calculations and can read correct time from watch face. Displays appropriate judgement.  No new identified risk were noted.  No failures at ADL's or IADL's.    BMI- discussed the importance of a healthy diet, water intake and the  benefits of aerobic exercise. Educational material provided.   24 hour diet recall: Regular diet  Dental- dentures  Eye- Visual acuity not assessed per patient preference since they have regular follow up with the ophthalmologist.  Wears corrective lenses.  Sleep patterns- Sleeps 6-7 hours at night.  Wakes feeling rested.  Taking medication as prescribed.    TDAP vaccine deferred per patient preference.  Follow up with insurance.  Educational material provided.  Patient Concerns: None at this time. Follow up with PCP as needed.  Exercise Activities and Dietary recommendations    Goals    . Healthy Lifestyle     Stay hydrated Low carb foods Stretch!  Exercise.       Fall Risk Fall Risk  09/15/2017 09/14/2016 05/11/2016 11/06/2015 09/15/2015  Falls in the past year? No No No No No   Depression Screen PHQ 2/9 Scores 09/15/2017 11/11/2016 09/14/2016 05/11/2016  PHQ - 2 Score 0 0 0 0  PHQ- 9 Score - 1 - -    Cognitive Function MMSE - Mini Mental State Exam 09/14/2016 09/15/2015  Orientation to time 5 5  Orientation to Place 5 5  Registration 3 3  Attention/ Calculation 5 5  Recall 3 3  Language- name 2 objects 2 2  Language- repeat 1 1  Language- follow 3 step command 3 3  Language- read & follow direction 1 1  Write a sentence 1 1  Copy design 1 1  Total score 30 30     6CIT Screen 09/15/2017  What Year? 0 points  What month? 0 points  What time? 0 points  Count back from 20 0 points  Months in reverse 0 points  Repeat phrase 0 points  Total Score 0     Immunization History  Administered Date(s) Administered  . Influenza Split 05/02/2012  . Influenza, High Dose Seasonal PF 03/19/2016, 03/18/2017  . Influenza,inj,Quad PF,6+ Mos 05/01/2013, 02/13/2014, 05/08/2015  . Pneumococcal Conjugate-13 10/05/2013  . Pneumococcal Polysaccharide-23 11/05/2014    Screening Tests Health Maintenance  Topic Date Due  . TETANUS/TDAP  08/09/1949  . HEMOGLOBIN A1C  09/26/2017  . FOOT EXAM  11/11/2017  . OPHTHALMOLOGY EXAM  09/02/2018  . INFLUENZA VACCINE  Completed  . PNA vac Low Risk Adult  Completed    Plan:    End of life planning; Advanced aging; Advanced directives discussed.  No HCPOA/Living Will.  Additional information declined at this time.  I have personally reviewed and noted the following in the patient's chart:   . Medical and social history . Use of alcohol, tobacco or illicit drugs  . Current medications and supplements . Functional ability and status . Nutritional status . Physical activity . Advanced directives . List of other physicians . Hospitalizations, surgeries, and ER visits in previous 12 months . Vitals . Screenings to include cognitive, depression, and falls . Referrals and appointments  In addition, I have reviewed and discussed with patient certain preventive protocols, quality metrics, and best practice recommendations. A written personalized care plan for preventive services as well as general preventive health recommendations were provided to patient.     Varney Biles, LPN  3/81/0175   Reviewed above information.  Agree with assessment and plan.    Dr Nicki Reaper

## 2017-09-19 ENCOUNTER — Other Ambulatory Visit (INDEPENDENT_AMBULATORY_CARE_PROVIDER_SITE_OTHER): Payer: Medicare HMO

## 2017-09-19 DIAGNOSIS — E119 Type 2 diabetes mellitus without complications: Secondary | ICD-10-CM | POA: Diagnosis not present

## 2017-09-19 DIAGNOSIS — I10 Essential (primary) hypertension: Secondary | ICD-10-CM | POA: Diagnosis not present

## 2017-09-19 DIAGNOSIS — E7800 Pure hypercholesterolemia, unspecified: Secondary | ICD-10-CM

## 2017-09-19 LAB — HEPATIC FUNCTION PANEL
ALT: 11 U/L (ref 0–53)
AST: 18 U/L (ref 0–37)
Albumin: 4.1 g/dL (ref 3.5–5.2)
Alkaline Phosphatase: 60 U/L (ref 39–117)
BILIRUBIN TOTAL: 0.7 mg/dL (ref 0.2–1.2)
Bilirubin, Direct: 0.2 mg/dL (ref 0.0–0.3)
Total Protein: 7 g/dL (ref 6.0–8.3)

## 2017-09-19 LAB — BASIC METABOLIC PANEL
BUN: 19 mg/dL (ref 6–23)
CALCIUM: 9.7 mg/dL (ref 8.4–10.5)
CHLORIDE: 103 meq/L (ref 96–112)
CO2: 26 meq/L (ref 19–32)
CREATININE: 1.2 mg/dL (ref 0.40–1.50)
GFR: 61.14 mL/min (ref >60.00)
Glucose, Bld: 125 mg/dL — ABNORMAL HIGH (ref 70–99)
Potassium: 3.9 mEq/L (ref 3.5–5.1)
SODIUM: 138 meq/L (ref 135–145)

## 2017-09-19 LAB — LIPID PANEL
CHOLESTEROL: 112 mg/dL (ref 0–200)
HDL: 34.1 mg/dL — ABNORMAL LOW (ref >39.00)
LDL CALC: 66 mg/dL (ref 0–99)
NONHDL: 77.79
Total CHOL/HDL Ratio: 3
Triglycerides: 58 mg/dL (ref 0.0–149.0)
VLDL: 11.6 mg/dL (ref 0.0–40.0)

## 2017-09-19 LAB — MICROALBUMIN / CREATININE URINE RATIO
Creatinine,U: 108.5 mg/dL
MICROALB UR: 26.3 mg/dL — AB (ref 0.0–1.9)
MICROALB/CREAT RATIO: 24.2 mg/g (ref 0.0–30.0)

## 2017-09-19 LAB — HEMOGLOBIN A1C: HEMOGLOBIN A1C: 6.1 % (ref 4.6–6.5)

## 2017-09-21 ENCOUNTER — Other Ambulatory Visit: Payer: Self-pay | Admitting: Internal Medicine

## 2017-09-28 DIAGNOSIS — Z85828 Personal history of other malignant neoplasm of skin: Secondary | ICD-10-CM | POA: Diagnosis not present

## 2017-09-28 DIAGNOSIS — D225 Melanocytic nevi of trunk: Secondary | ICD-10-CM | POA: Diagnosis not present

## 2017-09-28 DIAGNOSIS — D2262 Melanocytic nevi of left upper limb, including shoulder: Secondary | ICD-10-CM | POA: Diagnosis not present

## 2017-09-28 DIAGNOSIS — L821 Other seborrheic keratosis: Secondary | ICD-10-CM | POA: Diagnosis not present

## 2017-09-28 DIAGNOSIS — D2272 Melanocytic nevi of left lower limb, including hip: Secondary | ICD-10-CM | POA: Diagnosis not present

## 2017-09-28 DIAGNOSIS — D2261 Melanocytic nevi of right upper limb, including shoulder: Secondary | ICD-10-CM | POA: Diagnosis not present

## 2017-09-28 DIAGNOSIS — L570 Actinic keratosis: Secondary | ICD-10-CM | POA: Diagnosis not present

## 2017-09-28 DIAGNOSIS — Z08 Encounter for follow-up examination after completed treatment for malignant neoplasm: Secondary | ICD-10-CM | POA: Diagnosis not present

## 2017-09-28 DIAGNOSIS — D2271 Melanocytic nevi of right lower limb, including hip: Secondary | ICD-10-CM | POA: Diagnosis not present

## 2017-11-17 ENCOUNTER — Ambulatory Visit (INDEPENDENT_AMBULATORY_CARE_PROVIDER_SITE_OTHER): Payer: Medicare HMO | Admitting: Internal Medicine

## 2017-11-17 ENCOUNTER — Encounter: Payer: Self-pay | Admitting: Internal Medicine

## 2017-11-17 VITALS — BP 134/72 | HR 55 | Temp 97.5°F | Resp 18 | Wt 229.4 lb

## 2017-11-17 DIAGNOSIS — N289 Disorder of kidney and ureter, unspecified: Secondary | ICD-10-CM | POA: Diagnosis not present

## 2017-11-17 DIAGNOSIS — Z6832 Body mass index (BMI) 32.0-32.9, adult: Secondary | ICD-10-CM | POA: Diagnosis not present

## 2017-11-17 DIAGNOSIS — M1A9XX0 Chronic gout, unspecified, without tophus (tophi): Secondary | ICD-10-CM

## 2017-11-17 DIAGNOSIS — I10 Essential (primary) hypertension: Secondary | ICD-10-CM | POA: Diagnosis not present

## 2017-11-17 DIAGNOSIS — E119 Type 2 diabetes mellitus without complications: Secondary | ICD-10-CM

## 2017-11-17 DIAGNOSIS — E7800 Pure hypercholesterolemia, unspecified: Secondary | ICD-10-CM

## 2017-11-17 DIAGNOSIS — R972 Elevated prostate specific antigen [PSA]: Secondary | ICD-10-CM

## 2017-11-17 DIAGNOSIS — Z0000 Encounter for general adult medical examination without abnormal findings: Secondary | ICD-10-CM

## 2017-11-17 LAB — HM DIABETES FOOT EXAM

## 2017-11-17 MED ORDER — LORAZEPAM 1 MG PO TABS: ORAL_TABLET | ORAL | 2 refills | Status: DC

## 2017-11-17 NOTE — Assessment & Plan Note (Addendum)
Physical today 11/17/17.  Declines prostate checks and psa f/u.  Colonoscopy 08/26/10 - internal hemorrhoids.  No f/u recommended.  Discussed shingrix.

## 2017-11-17 NOTE — Progress Notes (Signed)
Patient ID: [name] [name], male   DOB: 10/04/1930, 84 y.o.   MRN: [id number]   Subjective:    Patient ID: [name] [name], male    DOB: 06/08/1931, 84 y.o.   MRN: [id number]  HPI  Patient here for his physical exam.  He is doing well.  Feels good. Stays active.  No chest pain.  No sob.  No acid reflux.  No abdominal pain.  Bowels moving.  Overall feels good.  Takes lorazepam prn.  Up to date with eye checks.  Discussed shingrix. Will get at pharmacy.     Past Medical History:  Diagnosis Date  . Arthritis    right shoulder  . Asthma   . BPH (benign prostatic hypertrophy)   . Gout   . Heart murmur   . History of chicken pox   . Hypercholesterolemia   . Hypertension   . Wears dentures    full upper and lower   Past Surgical History:  Procedure Laterality Date  . arm surgery     right arm fx s/p "plate insertion"  . CATARACT EXTRACTION W/PHACO Right 11/17/2016   Procedure: CATARACT EXTRACTION PHACO AND INTRAOCULAR LENS PLACEMENT ([place])  Right;  Surgeon: Leandrew Koyanagi, MD;  Location: [place];  Service: Ophthalmology;  Laterality: Right;  . DENTAL SURGERY     all teeth extracted   Family History  Problem Relation Age of Onset  . Leukemia Father   . Congestive Heart Failure Mother   . Cancer Daughter        Breast Cancer  . Prostate cancer Neg Hx   . Colon cancer Neg Hx    Social History   Socioeconomic History  . Marital status: Married    Spouse name: Not on file  . Number of children: Not on file  . Years of education: Not on file  . Highest education level: Not on file  Occupational History  . Not on file  Social Needs  . Financial resource strain: Not on file  . Food insecurity:    Worry: Never true    Inability: Never true  . Transportation needs:    Medical: No    Non-medical: No  Tobacco Use  . Smoking status: Never Smoker  . Smokeless tobacco: Current User    Types: Chew  Substance and Sexual Activity  . Alcohol use: No   Alcohol/week: 0.0 oz  . Drug use: No  . Sexual activity: Not Currently  Lifestyle  . Physical activity:    Days per week: 0 days    Minutes per session: Not on file  . Stress: Not at all  Relationships  . Social connections:    Talks on phone: Not on file    Gets together: Not on file    Attends religious service: Not on file    Active member of club or organization: Not on file    Attends meetings of clubs or organizations: Not on file    Relationship status: Not on file  Other Topics Concern  . Not on file  Social History Narrative  . Not on file    Outpatient Encounter Medications as of 11/17/2017  Medication Sig  . allopurinol (ZYLOPRIM) 100 MG tablet TAKE 1 TABLET EVERY DAY  . amLODipine (NORVASC) 5 MG tablet TAKE 1 TABLET EVERY DAY  . aspirin 81 MG tablet Take 81 mg by mouth daily.  Marland Kitchen atorvastatin (LIPITOR) 10 MG tablet TAKE 1 TABLET EVERY DAY  . colchicine (COLCRYS) 0.6 MG tablet Take  1 tablet (0.6 mg total) by mouth 2 (two) times daily as needed.  . hydrochlorothiazide (MICROZIDE) 12.5 MG capsule TAKE 1 CAPSULE EVERY DAY  . lisinopril (PRINIVIL,ZESTRIL) 40 MG tablet TAKE 1 TABLET (40 MG TOTAL) BY MOUTH DAILY.  Marland Kitchen LORazepam (ATIVAN) 1 MG tablet Takes 1/2 to 1 tablet q day prn  . metoprolol succinate (TOPROL-XL) 50 MG 24 hr tablet TAKE 1 TABLET (50 MG TOTAL) BY MOUTH 2 (TWO) TIMES DAILY. TAKE WITH OR IMMEDIATELY FOLLOWING A MEAL.  . [DISCONTINUED] LORazepam (ATIVAN) 1 MG tablet Takes 1/2 to 1 tablet q day prn   No facility-administered encounter medications on file as of 11/17/2017.     Review of Systems  Constitutional: Negative for appetite change and unexpected weight change.  HENT: Negative for congestion and sinus pressure.   Eyes: Negative for pain and visual disturbance.  Respiratory: Negative for cough, chest tightness and shortness of breath.   Cardiovascular: Negative for chest pain, palpitations and leg swelling.  Gastrointestinal: Negative for abdominal  pain, diarrhea, nausea and vomiting.  Genitourinary: Negative for difficulty urinating and dysuria.  Musculoskeletal: Negative for joint swelling and myalgias.  Skin: Negative for color change and rash.  Neurological: Negative for dizziness, light-headedness and headaches.  Hematological: Negative for adenopathy. Does not bruise/bleed easily.  Psychiatric/Behavioral: Negative for agitation and dysphoric mood.       Objective:    Physical Exam  Constitutional: He is oriented to person, place, and time. He appears well-developed and well-nourished. No distress.  HENT:  Head: Normocephalic and atraumatic.  Nose: Nose normal.  Mouth/Throat: Oropharynx is clear and moist. No oropharyngeal exudate.  Eyes: Conjunctivae are normal. Right eye exhibits no discharge. Left eye exhibits no discharge.  Neck: Neck supple. No thyromegaly present.  Cardiovascular: Normal rate and regular rhythm.  Pulmonary/Chest: Breath sounds normal. No respiratory distress. He has no wheezes.  Abdominal: Soft. Bowel sounds are normal. There is no tenderness.  Genitourinary:  Genitourinary Comments: Not performed.    Musculoskeletal: He exhibits no edema or tenderness.  Feet:  No lesion.  Intact to pin prick and light touch - increased sensation as move up to mid foot.   Lymphadenopathy:    He has no cervical adenopathy.  Neurological: He is alert and oriented to person, place, and time.  Skin: No rash noted. No erythema.  Psychiatric: He has a normal mood and affect. His behavior is normal.    BP 134/72 (BP Location: Left Arm, Patient Position: Sitting, Cuff Size: Large)   Pulse (!) 55   Temp (!) 97.5 F (36.4 C) (Oral)   Resp 18   Wt 229 lb 6.4 oz (104.1 kg)   SpO2 96%   BMI 32.92 kg/m  Wt Readings from Last 3 Encounters:  11/17/17 229 lb 6.4 oz (104.1 kg)  09/15/17 233 lb 12.8 oz (106.1 kg)  07/20/17 235 lb (106.6 kg)     Lab Results  Component Value Date   WBC 9.1 03/29/2017   HGB 14.2  03/29/2017   HCT 42.2 03/29/2017   PLT 186.0 03/29/2017   GLUCOSE 125 (H) 09/19/2017   CHOL 112 09/19/2017   TRIG 58.0 09/19/2017   HDL 34.10 (L) 09/19/2017   LDLCALC 66 09/19/2017   ALT 11 09/19/2017   AST 18 09/19/2017   NA 138 09/19/2017   K 3.9 09/19/2017   CL 103 09/19/2017   CREATININE 1.20 09/19/2017   BUN 19 09/19/2017   CO2 26 09/19/2017   TSH 3.29 11/11/2016   PSA 4.15 (H)  02/08/2014   HGBA1C 6.1 09/19/2017   MICROALBUR 26.3 (H) 09/19/2017       Assessment & Plan:   Problem List Items Addressed This Visit    BMI 32.0-32.9,adult    Discussed diet and exercise.  Follow.       Diabetes ([place])    Low carb diet and exercise.  Follow met b and a1c.   Lab Results  Component Value Date   HGBA1C 6.1 09/19/2017        Relevant Orders   Hemoglobin Z3P   Basic metabolic panel   Elevated PSA    Declines any further testing,psa check or evaluation.       Gout    On allopurinol.  No recent flares. Follow.       Health care maintenance    Physical today 11/17/17.  Declines prostate checks and psa f/u.  Colonoscopy 08/26/10 - internal hemorrhoids.  No f/u recommended.  Discussed shingrix.        Hypercholesterolemia    On lipitor.  Low cholesterol diet and exercise.  Follow lipid panel and liver function tests.        Relevant Orders   Lipid panel   Hepatic function panel   Hypertension    Blood pressure under good control.  Continue same medication regimen.  Follow pressures.  Follow metabolic panel.        Relevant Orders   TSH   Renal insufficiency    Creatinine stable.  Avoid antiinflammatories.  Follow metabolic panel. Creatinine last checked 1.20 (GFR 61).         Other Visit Diagnoses    Routine general medical examination at a health care facility    -  Primary       Einar Pheasant, MD

## 2017-11-20 ENCOUNTER — Encounter: Payer: Self-pay | Admitting: Internal Medicine

## 2017-11-20 NOTE — Assessment & Plan Note (Signed)
Blood pressure under good control.  Continue same medication regimen.  Follow pressures.  Follow metabolic panel.   

## 2017-11-20 NOTE — Assessment & Plan Note (Signed)
Low carb diet and exercise.  Follow met b and a1c.   Lab Results  Component Value Date   HGBA1C 6.1 09/19/2017

## 2017-11-20 NOTE — Assessment & Plan Note (Signed)
Declines any further testing,psa check or evaluation.

## 2017-11-20 NOTE — Assessment & Plan Note (Signed)
On lipitor.  Low cholesterol diet and exercise.  Follow lipid panel and liver function tests.   

## 2017-11-20 NOTE — Assessment & Plan Note (Signed)
On allopurinol.  No recent flares. Follow.

## 2017-11-20 NOTE — Assessment & Plan Note (Signed)
Discussed diet and exercise.  Follow.  

## 2017-11-20 NOTE — Assessment & Plan Note (Signed)
Creatinine stable.  Avoid antiinflammatories.  Follow metabolic panel. Creatinine last checked 1.20 (GFR 61).

## 2018-03-21 ENCOUNTER — Other Ambulatory Visit (INDEPENDENT_AMBULATORY_CARE_PROVIDER_SITE_OTHER): Payer: Medicare HMO

## 2018-03-21 DIAGNOSIS — E119 Type 2 diabetes mellitus without complications: Secondary | ICD-10-CM | POA: Diagnosis not present

## 2018-03-21 DIAGNOSIS — I10 Essential (primary) hypertension: Secondary | ICD-10-CM

## 2018-03-21 DIAGNOSIS — E7800 Pure hypercholesterolemia, unspecified: Secondary | ICD-10-CM | POA: Diagnosis not present

## 2018-03-21 LAB — LIPID PANEL
CHOL/HDL RATIO: 3
Cholesterol: 102 mg/dL (ref 0–200)
HDL: 34.5 mg/dL — AB (ref >39.00)
LDL Cholesterol: 57 mg/dL (ref 0–99)
NONHDL: 67.12
Triglycerides: 49 mg/dL (ref 0.0–149.0)
VLDL: 9.8 mg/dL (ref 0.0–40.0)

## 2018-03-21 LAB — BASIC METABOLIC PANEL
BUN: 21 mg/dL (ref 6–23)
CHLORIDE: 105 meq/L (ref 96–112)
CO2: 27 meq/L (ref 19–32)
Calcium: 9.5 mg/dL (ref 8.4–10.5)
Creatinine, Ser: 1.36 mg/dL (ref 0.40–1.50)
GFR: 52.86 mL/min — AB (ref >60.00)
Glucose, Bld: 107 mg/dL — ABNORMAL HIGH (ref 70–99)
POTASSIUM: 4.1 meq/L (ref 3.5–5.1)
SODIUM: 139 meq/L (ref 135–145)

## 2018-03-21 LAB — HEMOGLOBIN A1C: Hgb A1c MFr Bld: 5.9 % (ref 4.6–6.5)

## 2018-03-21 LAB — HEPATIC FUNCTION PANEL
ALK PHOS: 55 U/L (ref 39–117)
ALT: 8 U/L (ref 0–53)
AST: 15 U/L (ref 0–37)
Albumin: 4.2 g/dL (ref 3.5–5.2)
BILIRUBIN TOTAL: 0.7 mg/dL (ref 0.2–1.2)
Bilirubin, Direct: 0.1 mg/dL (ref 0.0–0.3)
Total Protein: 6.4 g/dL (ref 6.0–8.3)

## 2018-03-21 LAB — TSH: TSH: 2.61 u[IU]/mL (ref 0.35–4.50)

## 2018-03-22 ENCOUNTER — Encounter: Payer: Self-pay | Admitting: *Deleted

## 2018-04-11 ENCOUNTER — Other Ambulatory Visit: Payer: Self-pay | Admitting: Internal Medicine

## 2018-04-27 ENCOUNTER — Other Ambulatory Visit: Payer: Self-pay | Admitting: Internal Medicine

## 2018-05-22 ENCOUNTER — Ambulatory Visit (INDEPENDENT_AMBULATORY_CARE_PROVIDER_SITE_OTHER): Payer: Medicare HMO | Admitting: Internal Medicine

## 2018-05-22 ENCOUNTER — Encounter: Payer: Self-pay | Admitting: Internal Medicine

## 2018-05-22 DIAGNOSIS — E7800 Pure hypercholesterolemia, unspecified: Secondary | ICD-10-CM

## 2018-05-22 DIAGNOSIS — Z23 Encounter for immunization: Secondary | ICD-10-CM | POA: Diagnosis not present

## 2018-05-22 DIAGNOSIS — R972 Elevated prostate specific antigen [PSA]: Secondary | ICD-10-CM | POA: Diagnosis not present

## 2018-05-22 DIAGNOSIS — I10 Essential (primary) hypertension: Secondary | ICD-10-CM

## 2018-05-22 DIAGNOSIS — E119 Type 2 diabetes mellitus without complications: Secondary | ICD-10-CM

## 2018-05-22 NOTE — Assessment & Plan Note (Signed)
Blood pressure on recheck improved.  Continue same medication regimen.  Follow pressures.  Follow metabolic panel.   

## 2018-05-22 NOTE — Assessment & Plan Note (Signed)
Low carb diet and exercise.  Last a1c 03/2018 - 6.1.  Follow met b and a1c.

## 2018-05-22 NOTE — Assessment & Plan Note (Signed)
Declines any further testing - including psa check, etc.

## 2018-05-22 NOTE — Assessment & Plan Note (Signed)
On lipitor.  Low cholesterol diet and exercise.  Follow lipid panel and liver function tests.   

## 2018-05-22 NOTE — Progress Notes (Signed)
Patient ID: [name] [name], male   DOB: [birth year], 84 y.o.   MRN: [id number]   Subjective:    Patient ID: [name] [name], male    DOB: 1931-05-25, 84 y.o.   MRN: [id number]  HPI  Patient here for a scheduled follow up.  He reports he is doing well.  Stays active.  No chest pain.  No sob.  No acid reflux.  No abdominal pain.  Bowels moving.  No urine change.  Overall feels good.  No recent gout flares reported.     Past Medical History:  Diagnosis Date  . Arthritis    right shoulder  . Asthma   . BPH (benign prostatic hypertrophy)   . Gout   . Heart murmur   . History of chicken pox   . Hypercholesterolemia   . Hypertension   . Wears dentures    full upper and lower   Past Surgical History:  Procedure Laterality Date  . arm surgery     right arm fx s/p "plate insertion"  . CATARACT EXTRACTION W/PHACO Right 11/17/2016   Procedure: CATARACT EXTRACTION PHACO AND INTRAOCULAR LENS PLACEMENT ([place])  Right;  Surgeon: Leandrew Koyanagi, MD;  Location: [place];  Service: Ophthalmology;  Laterality: Right;  . DENTAL SURGERY     all teeth extracted   Family History  Problem Relation Age of Onset  . Leukemia Father   . Congestive Heart Failure Mother   . Cancer Daughter        Breast Cancer  . Prostate cancer Neg Hx   . Colon cancer Neg Hx    Social History   Socioeconomic History  . Marital status: Married    Spouse name: Not on file  . Number of children: Not on file  . Years of education: Not on file  . Highest education level: Not on file  Occupational History  . Not on file  Social Needs  . Financial resource strain: Not on file  . Food insecurity:    Worry: Never true    Inability: Never true  . Transportation needs:    Medical: No    Non-medical: No  Tobacco Use  . Smoking status: Never Smoker  . Smokeless tobacco: Current User    Types: Chew  Substance and Sexual Activity  . Alcohol use: No    Alcohol/week: 0.0 standard drinks  . Drug  use: No  . Sexual activity: Not Currently  Lifestyle  . Physical activity:    Days per week: 0 days    Minutes per session: Not on file  . Stress: Not at all  Relationships  . Social connections:    Talks on phone: Not on file    Gets together: Not on file    Attends religious service: Not on file    Active member of club or organization: Not on file    Attends meetings of clubs or organizations: Not on file    Relationship status: Not on file  Other Topics Concern  . Not on file  Social History Narrative  . Not on file    Outpatient Encounter Medications as of 05/22/2018  Medication Sig  . allopurinol (ZYLOPRIM) 100 MG tablet TAKE 1 TABLET EVERY DAY  . amLODipine (NORVASC) 5 MG tablet TAKE 1 TABLET EVERY DAY  . aspirin 81 MG tablet Take 81 mg by mouth daily.  Marland Kitchen atorvastatin (LIPITOR) 10 MG tablet TAKE 1 TABLET EVERY DAY  . colchicine (COLCRYS) 0.6 MG tablet Take 1 tablet (0.6 mg  total) by mouth 2 (two) times daily as needed.  . hydrochlorothiazide (MICROZIDE) 12.5 MG capsule TAKE 1 CAPSULE EVERY DAY  . lisinopril (PRINIVIL,ZESTRIL) 40 MG tablet TAKE 1 TABLET (40 MG TOTAL) BY MOUTH DAILY.  Marland Kitchen LORazepam (ATIVAN) 1 MG tablet Takes 1/2 to 1 tablet q day prn  . metoprolol succinate (TOPROL-XL) 50 MG 24 hr tablet TAKE 1 TABLET (50 MG TOTAL) BY MOUTH 2 (TWO) TIMES DAILY. TAKE WITH OR IMMEDIATELY FOLLOWING A MEAL.   No facility-administered encounter medications on file as of 05/22/2018.     Review of Systems  Constitutional: Negative for appetite change and unexpected weight change.  HENT: Negative for congestion and sinus pressure.   Respiratory: Negative for cough, chest tightness and shortness of breath.   Cardiovascular: Negative for chest pain, palpitations and leg swelling.  Gastrointestinal: Negative for abdominal pain, diarrhea, nausea and vomiting.  Genitourinary: Negative for difficulty urinating and dysuria.  Musculoskeletal: Negative for joint swelling and myalgias.    Skin: Negative for color change and rash.  Neurological: Negative for dizziness and light-headedness.  Psychiatric/Behavioral: Negative for agitation and dysphoric mood.       Objective:     Blood pressure rechecked by me:  138/74  Physical Exam  Constitutional: He appears well-developed and well-nourished. No distress.  HENT:  Nose: Nose normal.  Mouth/Throat: Oropharynx is clear and moist.  Neck: Neck supple. No thyromegaly present.  Cardiovascular: Normal rate and regular rhythm.  2/6 harsh systolic murmur.    Pulmonary/Chest: Effort normal and breath sounds normal. No respiratory distress.  Abdominal: Soft. Bowel sounds are normal. There is no tenderness.  Musculoskeletal: He exhibits no edema or tenderness.  Lymphadenopathy:    He has no cervical adenopathy.  Skin: No rash noted. No erythema.  Psychiatric: He has a normal mood and affect. His behavior is normal.    BP 138/74   Pulse 68   Temp (!) 97.5 F (36.4 C) (Oral)   Resp 18   Wt 230 lb (104.3 kg)   SpO2 98%   BMI 33.00 kg/m  Wt Readings from Last 3 Encounters:  05/22/18 230 lb (104.3 kg)  11/17/17 229 lb 6.4 oz (104.1 kg)  09/15/17 233 lb 12.8 oz (106.1 kg)     Lab Results  Component Value Date   WBC 9.1 03/29/2017   HGB 14.2 03/29/2017   HCT 42.2 03/29/2017   PLT 186.0 03/29/2017   GLUCOSE 107 (H) 03/21/2018   CHOL 102 03/21/2018   TRIG 49.0 03/21/2018   HDL 34.50 (L) 03/21/2018   LDLCALC 57 03/21/2018   ALT 8 03/21/2018   AST 15 03/21/2018   NA 139 03/21/2018   K 4.1 03/21/2018   CL 105 03/21/2018   CREATININE 1.36 03/21/2018   BUN 21 03/21/2018   CO2 27 03/21/2018   TSH 2.61 03/21/2018   PSA 4.15 (H) 02/08/2014   HGBA1C 5.9 03/21/2018   MICROALBUR 26.3 (H) 09/19/2017       Assessment & Plan:   Problem List Items Addressed This Visit    Diabetes ([place])    Low carb diet and exercise.  Last a1c 03/2018 - 6.1.  Follow met b and a1c.        Relevant Orders   Hemoglobin Y4M    Basic metabolic panel   Microalbumin / creatinine urine ratio   Elevated PSA    Declines any further testing - including psa check, etc.       Hypercholesterolemia    On lipitor.  Low cholesterol  diet and exercise.  Follow lipid panel and liver function tests.        Relevant Orders   Hepatic function panel   Lipid panel   Hypertension    Blood pressure on recheck improved.  Continue same medication regimen.  Follow pressures.  Follow metabolic panel.        Relevant Orders   CBC with Differential/Platelet    Other Visit Diagnoses    Encounter for immunization       Relevant Orders   Flu vaccine HIGH DOSE PF (Completed)       Einar Pheasant, MD

## 2018-05-25 ENCOUNTER — Encounter: Payer: Self-pay | Admitting: Internal Medicine

## 2018-07-03 ENCOUNTER — Other Ambulatory Visit: Payer: Self-pay | Admitting: Internal Medicine

## 2018-07-10 ENCOUNTER — Other Ambulatory Visit: Payer: Self-pay | Admitting: Internal Medicine

## 2018-08-09 ENCOUNTER — Other Ambulatory Visit (INDEPENDENT_AMBULATORY_CARE_PROVIDER_SITE_OTHER): Payer: Medicare HMO

## 2018-08-09 DIAGNOSIS — I10 Essential (primary) hypertension: Secondary | ICD-10-CM

## 2018-08-09 DIAGNOSIS — E7800 Pure hypercholesterolemia, unspecified: Secondary | ICD-10-CM | POA: Diagnosis not present

## 2018-08-09 DIAGNOSIS — E119 Type 2 diabetes mellitus without complications: Secondary | ICD-10-CM | POA: Diagnosis not present

## 2018-08-09 LAB — CBC WITH DIFFERENTIAL/PLATELET
Basophils Absolute: 0.1 10*3/uL (ref 0.0–0.1)
Basophils Relative: 1.4 % (ref 0.0–3.0)
Eosinophils Absolute: 0.3 10*3/uL (ref 0.0–0.7)
Eosinophils Relative: 3.4 % (ref 0.0–5.0)
HCT: 41.8 % (ref 39.0–52.0)
Hemoglobin: 14.3 g/dL (ref 13.0–17.0)
LYMPHS PCT: 46.4 % — AB (ref 12.0–46.0)
Lymphs Abs: 4.2 10*3/uL — ABNORMAL HIGH (ref 0.7–4.0)
MCHC: 34.2 g/dL (ref 30.0–36.0)
MCV: 97.7 fl (ref 78.0–100.0)
MONOS PCT: 9 % (ref 3.0–12.0)
Monocytes Absolute: 0.8 10*3/uL (ref 0.1–1.0)
Neutro Abs: 3.6 10*3/uL (ref 1.4–7.7)
Neutrophils Relative %: 39.8 % — ABNORMAL LOW (ref 43.0–77.0)
Platelets: 188 10*3/uL (ref 150.0–400.0)
RBC: 4.28 Mil/uL (ref 4.22–5.81)
RDW: 13.5 % (ref 11.5–15.5)
WBC: 9 10*3/uL (ref 4.0–10.5)

## 2018-08-09 LAB — HEPATIC FUNCTION PANEL
ALT: 10 U/L (ref 0–53)
AST: 17 U/L (ref 0–37)
Albumin: 4.3 g/dL (ref 3.5–5.2)
Alkaline Phosphatase: 57 U/L (ref 39–117)
Bilirubin, Direct: 0.1 mg/dL (ref 0.0–0.3)
Total Bilirubin: 0.6 mg/dL (ref 0.2–1.2)
Total Protein: 6.9 g/dL (ref 6.0–8.3)

## 2018-08-09 LAB — MICROALBUMIN / CREATININE URINE RATIO
Creatinine,U: 115.1 mg/dL
Microalb Creat Ratio: 30.1 mg/g — ABNORMAL HIGH (ref 0.0–30.0)
Microalb, Ur: 34.6 mg/dL — ABNORMAL HIGH (ref 0.0–1.9)

## 2018-08-09 LAB — LIPID PANEL
CHOL/HDL RATIO: 3
Cholesterol: 111 mg/dL (ref 0–200)
HDL: 36.2 mg/dL — ABNORMAL LOW (ref >39.00)
LDL Cholesterol: 65 mg/dL (ref 0–99)
NonHDL: 75.25
Triglycerides: 50 mg/dL (ref 0.0–149.0)
VLDL: 10 mg/dL (ref 0.0–40.0)

## 2018-08-09 LAB — BASIC METABOLIC PANEL
BUN: 19 mg/dL (ref 6–23)
CALCIUM: 9.7 mg/dL (ref 8.4–10.5)
CO2: 26 mEq/L (ref 19–32)
Chloride: 104 mEq/L (ref 96–112)
Creatinine, Ser: 1.27 mg/dL (ref 0.40–1.50)
GFR: 53.77 mL/min — ABNORMAL LOW (ref >60.00)
Glucose, Bld: 99 mg/dL (ref 70–99)
Potassium: 4.1 mEq/L (ref 3.5–5.1)
Sodium: 137 mEq/L (ref 135–145)

## 2018-08-09 LAB — HEMOGLOBIN A1C: Hgb A1c MFr Bld: 5.8 % (ref 4.6–6.5)

## 2018-09-09 ENCOUNTER — Other Ambulatory Visit: Payer: Self-pay | Admitting: Internal Medicine

## 2018-09-18 ENCOUNTER — Ambulatory Visit: Payer: Medicare HMO

## 2018-10-09 ENCOUNTER — Other Ambulatory Visit: Payer: Self-pay

## 2018-10-09 ENCOUNTER — Ambulatory Visit (INDEPENDENT_AMBULATORY_CARE_PROVIDER_SITE_OTHER): Payer: Medicare HMO

## 2018-10-09 DIAGNOSIS — Z0000 Encounter for general adult medical examination without abnormal findings: Secondary | ICD-10-CM

## 2018-10-09 NOTE — Patient Instructions (Addendum)
  Mr. Shostak , Thank you for taking time to come for your Medicare Wellness Visit. I appreciate your ongoing commitment to your health goals. Please review the following plan we discussed and let me know if I can assist you in the future.   These are the goals we discussed: Goals      Patient Stated   . DIET - INCREASE WATER INTAKE (pt-stated)     Stay hydrated    . Increase physical activity (pt-stated)     Stay active       This is a list of the screening recommended for you and due dates:  Health Maintenance  Topic Date Due  . Tetanus Vaccine  08/09/1949  . Eye exam for diabetics  09/02/2018  . Complete foot exam   11/18/2018  . Flu Shot  01/20/2019  . Hemoglobin A1C  02/07/2019  . Pneumonia vaccines  Completed

## 2018-10-09 NOTE — Progress Notes (Signed)
Subjective:   [name] [name] is a 85 y.o. male who presents for Medicare Annual/Subsequent preventive examination.  Review of Systems:  No ROS.  Medicare Wellness Visit. Additional risk factors are reflected in the social history. Cardiac Risk Factors include: advanced age (>40men, >24 women);male gender;hypertension;diabetes mellitus    Objective:    Vitals: There were no vitals taken for this visit.  There is no height or weight on file to calculate BMI.   UTA vital signs, virtual visit.  Advanced Directives 10/09/2018 09/15/2017 11/17/2016 09/14/2016 09/15/2015  Does Patient Have a Medical Advance Directive? No No No No No  Would patient like information on creating a medical advance directive? No - Patient declined No - Patient declined No - Patient declined No - Patient declined No - patient declined information    Tobacco Social History   Tobacco Use  Smoking Status Never Smoker  Smokeless Tobacco Current User  . Types: Chew     Ready to quit: Not Answered Counseling given: Not Answered   Clinical Intake:  Pre-visit preparation completed: Yes        Diabetes: Yes(Followed by pcp)  How often do you need to have someone help you when you read instructions, pamphlets, or other written materials from your doctor or pharmacy?: 1 - Never  Interpreter Needed?: No     Past Medical History:  Diagnosis Date  . Arthritis    right shoulder  . Asthma   . BPH (benign prostatic hypertrophy)   . Gout   . Heart murmur   . History of chicken pox   . Hypercholesterolemia   . Hypertension   . Wears dentures    full upper and lower   Past Surgical History:  Procedure Laterality Date  . arm surgery     right arm fx s/p "plate insertion"  . CATARACT EXTRACTION W/PHACO Right 11/17/2016   Procedure: CATARACT EXTRACTION PHACO AND INTRAOCULAR LENS PLACEMENT ([place])  Right;  Surgeon: Leandrew Koyanagi, MD;  Location: [facility];  Service: Ophthalmology;   Laterality: Right;  . DENTAL SURGERY     all teeth extracted   Family History  Problem Relation Age of Onset  . Leukemia Father   . Congestive Heart Failure Mother   . Cancer Daughter        Breast Cancer  . Prostate cancer Neg Hx   . Colon cancer Neg Hx    Social History   Socioeconomic History  . Marital status: Married    Spouse name: Not on file  . Number of children: Not on file  . Years of education: Not on file  . Highest education level: Not on file  Occupational History  . Not on file  Social Needs  . Financial resource strain: Not hard at all  . Food insecurity:    Worry: Never true    Inability: Never true  . Transportation needs:    Medical: No    Non-medical: No  Tobacco Use  . Smoking status: Never Smoker  . Smokeless tobacco: Current User    Types: Chew  Substance and Sexual Activity  . Alcohol use: No    Alcohol/week: 0.0 standard drinks  . Drug use: No  . Sexual activity: Not Currently  Lifestyle  . Physical activity:    Days per week: 0 days    Minutes per session: Not on file  . Stress: Not at all  Relationships  . Social connections:    Talks on phone: Not on file  Gets together: Not on file    Attends religious service: Not on file    Active member of club or organization: Not on file    Attends meetings of clubs or organizations: Not on file    Relationship status: Not on file  Other Topics Concern  . Not on file  Social History Narrative  . Not on file    Outpatient Encounter Medications as of 10/09/2018  Medication Sig  . allopurinol (ZYLOPRIM) 100 MG tablet TAKE 1 TABLET EVERY DAY  . amLODipine (NORVASC) 5 MG tablet TAKE 1 TABLET EVERY DAY  . aspirin 81 MG tablet Take 81 mg by mouth daily.  Marland Kitchen atorvastatin (LIPITOR) 10 MG tablet TAKE 1 TABLET EVERY DAY  . colchicine (COLCRYS) 0.6 MG tablet Take 1 tablet (0.6 mg total) by mouth 2 (two) times daily as needed.  . hydrochlorothiazide (MICROZIDE) 12.5 MG capsule TAKE 1 CAPSULE  EVERY DAY  . lisinopril (PRINIVIL,ZESTRIL) 40 MG tablet TAKE 1 TABLET EVERY DAY  . LORazepam (ATIVAN) 1 MG tablet Takes 1/2 to 1 tablet q day prn  . metoprolol succinate (TOPROL-XL) 50 MG 24 hr tablet TAKE 1 TABLET TWO TIMES DAILY WITH OR IMMEDIATELY FOLLOWING A MEAL.   No facility-administered encounter medications on file as of 10/09/2018.     Activities of Daily Living In your present state of health, do you have any difficulty performing the following activities: 10/09/2018  Hearing? N  Vision? N  Difficulty concentrating or making decisions? N  Walking or climbing stairs? N  Dressing or bathing? N  Doing errands, shopping? N  Preparing Food and eating ? N  Using the Toilet? N  In the past six months, have you accidently leaked urine? N  Do you have problems with loss of bowel control? N  Managing your Medications? N  Managing your Finances? N  Housekeeping or managing your Housekeeping? N  Some recent data might be hidden    Patient Care Team: Einar Pheasant, MD as PCP - General (Internal Medicine)   Assessment:   This is a routine wellness examination for [name] [name].  I connected with patient 10/09/18 at 10:00 AM EDT by a video enabled telemedicine application and verified that I am speaking with the correct person using two identifiers. Patient stated full name and DOB. Patient gave permission to continue with virtual visit. Patient's location was at home and Nurse's location was at [name] [name] office.   Notes doing well overall.   Health Screenings  Colonoscopy -08/26/10 Glaucoma -none reported Hearing -demonstrates normal hearing during conversation Hemoglobin A1C -08/09/18 (5.8) Cholesterol - 08/09/18 (111) Dental- dentures Vision- no retinopathy reported. Encouraged to schedule eye exam.  Social  Alcohol intake -no Smoking history- never Smokeless tobacco in home? current Illicit drug use? none Exercise -stays active outdoors and around the house Diet -low carb  Sexually Active -not currently  Safety  Patient feels safe at home.  Patient does have smoke detectors at home  Patient does wear sunscreen or protective clothing when in direct sunlight  Patient does wear seat belt when driving or riding with others.   Activities of Daily Living Patient can do their own household chores. Denies needing assistance with: driving, feeding themselves, getting from bed to chair, getting to the toilet, bathing/showering, dressing, managing money, climbing flight of stairs, or preparing meals.   Depression Screen Patient denies losing interest in daily life, feeling hopeless, or crying easily over simple problems.   Fall Screen Patient denies being afraid of falling or falling in  the last year.   Memory Screen Patient denies problems with memory, misplacing items, and is able to balance checkbook/bank accounts.  Patient is alert, normal appearance, oriented to person/place/and time. Correctly identified the president of the [place], recall of 2/3 objects, and performing simple calculations.  Patient displays appropriate judgement and can read correct time from watch face.   Immunizations The following Immunizations are up to date: Influenza, shingles, pneumonia, and tetanus.   Other Providers Patient Care Team: Einar Pheasant, MD as PCP - General (Internal Medicine)  Exercise Activities and Dietary recommendations Current Exercise Habits: Home exercise routine, Type of exercise: walking(stays active outdoors at home), Intensity: Mild  Goals      Patient Stated   . DIET - INCREASE WATER INTAKE (pt-stated)     Stay hydrated    . Increase physical activity (pt-stated)     Stay active       Fall Risk Fall Risk  10/09/2018 09/15/2017 09/14/2016 05/11/2016 11/06/2015  Falls in the past year? 0 No No No No   Depression Screen PHQ 2/9 Scores 10/09/2018 09/15/2017 11/11/2016 09/14/2016  PHQ - 2 Score 0 0 0 0  PHQ- 9 Score - - 1 -    Cognitive Function  MMSE - Mini Mental State Exam 09/14/2016 09/15/2015  Orientation to time 5 5  Orientation to Place 5 5  Registration 3 3  Attention/ Calculation 5 5  Recall 3 3  Language- name 2 objects 2 2  Language- repeat 1 1  Language- follow 3 step command 3 3  Language- read & follow direction 1 1  Write a sentence 1 1  Copy design 1 1  Total score 30 30     6CIT Screen 10/09/2018 09/15/2017  What Year? 0 points 0 points  What month? 0 points 0 points  What time? 0 points 0 points  Count back from 20 0 points 0 points  Months in reverse 0 points 0 points  Repeat phrase 0 points 0 points  Total Score 0 0    Immunization History  Administered Date(s) Administered  . Influenza Split 05/02/2012  . Influenza, High Dose Seasonal PF 03/19/2016, 03/18/2017, 05/22/2018  . Influenza,inj,Quad PF,6+ Mos 05/01/2013, 02/13/2014, 05/08/2015  . Pneumococcal Conjugate-13 10/05/2013  . Pneumococcal Polysaccharide-23 11/05/2014   Screening Tests Health Maintenance  Topic Date Due  . TETANUS/TDAP  08/09/1949  . OPHTHALMOLOGY EXAM  09/02/2018  . FOOT EXAM  11/18/2018  . INFLUENZA VACCINE  01/20/2019  . HEMOGLOBIN A1C  02/07/2019  . PNA vac Low Risk Adult  Completed        Plan:   End of life planning; Advanced aging; Advanced directives discussed.  No HCPOA/Living Will.  Additional information declined at this time.  I have personally reviewed and noted the following in the patient's chart:   . Medical and social history . Use of alcohol, tobacco or illicit drugs  . Current medications and supplements . Functional ability and status . Nutritional status . Physical activity . Advanced directives . List of other physicians . Hospitalizations, surgeries, and ER visits in previous 12 months . Vitals . Screenings to include cognitive, depression, and falls . Referrals and appointments  In addition, I have reviewed and discussed with patient certain preventive protocols, quality metrics, and  best practice recommendations. A written personalized care plan for preventive services as well as general preventive health recommendations were provided to patient.     Varney Biles, LPN  03/04/7828   Reviewed above information.  Agree with  assessment and paln.   Dr Nicki Reaper

## 2018-11-11 ENCOUNTER — Other Ambulatory Visit: Payer: Self-pay | Admitting: Internal Medicine

## 2018-11-14 ENCOUNTER — Ambulatory Visit: Payer: Medicare HMO

## 2018-12-05 ENCOUNTER — Encounter: Payer: Medicare HMO | Admitting: Internal Medicine

## 2018-12-11 ENCOUNTER — Ambulatory Visit (INDEPENDENT_AMBULATORY_CARE_PROVIDER_SITE_OTHER): Payer: Medicare HMO | Admitting: Internal Medicine

## 2018-12-11 ENCOUNTER — Observation Stay
Admission: EM | Admit: 2018-12-11 | Discharge: 2018-12-13 | Disposition: A | Discharge status: Home / Self Care | Payer: Medicare HMO | Attending: Nurse Practitioner | Admitting: Nurse Practitioner

## 2018-12-11 ENCOUNTER — Other Ambulatory Visit: Payer: Self-pay

## 2018-12-11 ENCOUNTER — Emergency Department: Payer: Medicare HMO

## 2018-12-11 ENCOUNTER — Encounter: Payer: Self-pay | Admitting: Internal Medicine

## 2018-12-11 VITALS — BP 120/72 | HR 66 | Temp 97.6°F | Resp 16 | Wt 216.6 lb

## 2018-12-11 DIAGNOSIS — R7989 Other specified abnormal findings of blood chemistry: Secondary | ICD-10-CM | POA: Diagnosis not present

## 2018-12-11 DIAGNOSIS — R0602 Shortness of breath: Secondary | ICD-10-CM | POA: Diagnosis not present

## 2018-12-11 DIAGNOSIS — Z7982 Long term (current) use of aspirin: Secondary | ICD-10-CM | POA: Diagnosis not present

## 2018-12-11 DIAGNOSIS — Z79899 Other long term (current) drug therapy: Secondary | ICD-10-CM | POA: Insufficient documentation

## 2018-12-11 DIAGNOSIS — J9811 Atelectasis: Secondary | ICD-10-CM | POA: Diagnosis not present

## 2018-12-11 DIAGNOSIS — E785 Hyperlipidemia, unspecified: Secondary | ICD-10-CM | POA: Diagnosis not present

## 2018-12-11 DIAGNOSIS — N400 Enlarged prostate without lower urinary tract symptoms: Secondary | ICD-10-CM | POA: Insufficient documentation

## 2018-12-11 DIAGNOSIS — R918 Other nonspecific abnormal finding of lung field: Secondary | ICD-10-CM | POA: Diagnosis not present

## 2018-12-11 DIAGNOSIS — R0902 Hypoxemia: Secondary | ICD-10-CM | POA: Diagnosis not present

## 2018-12-11 DIAGNOSIS — R0600 Dyspnea, unspecified: Secondary | ICD-10-CM | POA: Diagnosis not present

## 2018-12-11 DIAGNOSIS — Z20828 Contact with and (suspected) exposure to other viral communicable diseases: Secondary | ICD-10-CM | POA: Diagnosis not present

## 2018-12-11 DIAGNOSIS — M109 Gout, unspecified: Secondary | ICD-10-CM | POA: Insufficient documentation

## 2018-12-11 DIAGNOSIS — I5021 Acute systolic (congestive) heart failure: Secondary | ICD-10-CM | POA: Insufficient documentation

## 2018-12-11 DIAGNOSIS — E7800 Pure hypercholesterolemia, unspecified: Secondary | ICD-10-CM | POA: Diagnosis not present

## 2018-12-11 DIAGNOSIS — E119 Type 2 diabetes mellitus without complications: Secondary | ICD-10-CM

## 2018-12-11 DIAGNOSIS — M19011 Primary osteoarthritis, right shoulder: Secondary | ICD-10-CM | POA: Insufficient documentation

## 2018-12-11 DIAGNOSIS — I110 Hypertensive heart disease with heart failure: Secondary | ICD-10-CM | POA: Insufficient documentation

## 2018-12-11 DIAGNOSIS — J45909 Unspecified asthma, uncomplicated: Secondary | ICD-10-CM | POA: Diagnosis not present

## 2018-12-11 DIAGNOSIS — J9601 Acute respiratory failure with hypoxia: Secondary | ICD-10-CM | POA: Diagnosis not present

## 2018-12-11 DIAGNOSIS — Z1159 Encounter for screening for other viral diseases: Secondary | ICD-10-CM | POA: Insufficient documentation

## 2018-12-11 DIAGNOSIS — J90 Pleural effusion, not elsewhere classified: Secondary | ICD-10-CM | POA: Diagnosis not present

## 2018-12-11 DIAGNOSIS — I10 Essential (primary) hypertension: Secondary | ICD-10-CM

## 2018-12-11 DIAGNOSIS — R7301 Impaired fasting glucose: Secondary | ICD-10-CM | POA: Diagnosis not present

## 2018-12-11 DIAGNOSIS — N179 Acute kidney failure, unspecified: Secondary | ICD-10-CM | POA: Diagnosis not present

## 2018-12-11 LAB — COMPREHENSIVE METABOLIC PANEL
ALT: 12 U/L (ref 0–44)
AST: 22 U/L (ref 15–41)
Albumin: 4.6 g/dL (ref 3.5–5.0)
Alkaline Phosphatase: 62 U/L (ref 38–126)
Anion gap: 12 (ref 5–15)
BUN: 38 mg/dL — ABNORMAL HIGH (ref 8–23)
CO2: 18 mmol/L — ABNORMAL LOW (ref 22–32)
Calcium: 9.9 mg/dL (ref 8.9–10.3)
Chloride: 105 mmol/L (ref 98–111)
Creatinine, Ser: 1.79 mg/dL — ABNORMAL HIGH (ref 0.61–1.24)
GFR calc Af Amer: 39 mL/min — ABNORMAL LOW (ref >60)
GFR calc non Af Amer: 34 mL/min — ABNORMAL LOW (ref >60)
Glucose, Bld: 143 mg/dL — ABNORMAL HIGH (ref 70–99)
Potassium: 4.3 mmol/L (ref 3.5–5.1)
Sodium: 135 mmol/L (ref 135–145)
Total Bilirubin: 1.5 mg/dL — ABNORMAL HIGH (ref 0.3–1.2)
Total Protein: 7.7 g/dL (ref 6.5–8.1)

## 2018-12-11 LAB — LACTIC ACID, PLASMA
Lactic Acid, Venous: 2.5 mmol/L (ref 0.5–1.9)
Lactic Acid, Venous: 1.4 mmol/L (ref 0.5–1.9)

## 2018-12-11 LAB — CBC WITH DIFFERENTIAL/PLATELET
Abs Immature Granulocytes: 0.04 10*3/uL (ref 0.00–0.07)
Basophils Absolute: 0.1 10*3/uL (ref 0.0–0.1)
Basophils Relative: 1 %
Eosinophils Absolute: 0.1 10*3/uL (ref 0.0–0.5)
Eosinophils Relative: 1 %
HCT: 42 % (ref 39.0–52.0)
Hemoglobin: 14.3 g/dL (ref 13.0–17.0)
Immature Granulocytes: 0 %
Lymphocytes Relative: 26 %
Lymphs Abs: 2.6 10*3/uL (ref 0.7–4.0)
MCH: 33.9 pg (ref 26.0–34.0)
MCHC: 34 g/dL (ref 30.0–36.0)
MCV: 99.5 fL (ref 80.0–100.0)
Monocytes Absolute: 0.8 10*3/uL (ref 0.1–1.0)
Monocytes Relative: 8 %
Neutro Abs: 6.4 10*3/uL (ref 1.7–7.7)
Neutrophils Relative %: 64 %
Platelets: 204 10*3/uL (ref 150–400)
RBC: 4.22 MIL/uL (ref 4.22–5.81)
RDW: 14.6 % (ref 11.5–15.5)
WBC: 10 10*3/uL (ref 4.0–10.5)
nRBC: 0 % (ref 0.0–0.2)

## 2018-12-11 LAB — CREATININE, SERUM
Creatinine, Ser: 1.52 mg/dL — ABNORMAL HIGH (ref 0.61–1.24)
GFR calc Af Amer: 47 mL/min — ABNORMAL LOW (ref >60)
GFR calc non Af Amer: 41 mL/min — ABNORMAL LOW (ref >60)

## 2018-12-11 LAB — GLUCOSE, CAPILLARY
Glucose-Capillary: 102 mg/dL — ABNORMAL HIGH (ref 70–99)
Glucose-Capillary: 127 mg/dL — ABNORMAL HIGH (ref 70–99)

## 2018-12-11 LAB — SARS CORONAVIRUS 2 BY RT PCR (HOSPITAL ORDER, PERFORMED IN ~~LOC~~ HOSPITAL LAB): SARS Coronavirus 2: NEGATIVE

## 2018-12-11 LAB — BRAIN NATRIURETIC PEPTIDE: B Natriuretic Peptide: 3449 pg/mL — ABNORMAL HIGH (ref 0.0–100.0)

## 2018-12-11 LAB — TROPONIN I: Troponin I: 0.04 ng/mL (ref <0.03)

## 2018-12-11 MED ORDER — ACETAMINOPHEN 650 MG RE SUPP: 650.0000 mg | Freq: Four times a day (QID) | RECTAL | Status: DC | PRN

## 2018-12-11 MED ORDER — ONDANSETRON HCL 4 MG/2ML IJ SOLN: 4.0000 mg | Freq: Four times a day (QID) | INTRAMUSCULAR | Status: DC | PRN

## 2018-12-11 MED ORDER — IPRATROPIUM-ALBUTEROL 0.5-2.5 (3) MG/3ML IN SOLN
3.0000 mL | Freq: Four times a day (QID) | RESPIRATORY_TRACT | Status: DC
  Administered 2018-12-11 – 2018-12-13 (×7): 3 mL via RESPIRATORY_TRACT
  Filled 2018-12-11 (×8): qty 3

## 2018-12-11 MED ORDER — INSULIN ASPART 100 UNIT/ML ~~LOC~~ SOLN: 0.0000 [IU] | Freq: Every day | SUBCUTANEOUS | Status: DC

## 2018-12-11 MED ORDER — ONDANSETRON HCL 4 MG PO TABS: 4.0000 mg | ORAL_TABLET | Freq: Four times a day (QID) | ORAL | Status: DC | PRN

## 2018-12-11 MED ORDER — BUDESONIDE 0.5 MG/2ML IN SUSP
0.5000 mg | Freq: Two times a day (BID) | RESPIRATORY_TRACT | Status: DC
  Administered 2018-12-12: 20:00:00 0.5 mg via RESPIRATORY_TRACT
  Filled 2018-12-11 (×4): qty 2

## 2018-12-11 MED ORDER — METOPROLOL SUCCINATE ER 50 MG PO TB24
50.0000 mg | ORAL_TABLET | Freq: Two times a day (BID) | ORAL | Status: DC
  Administered 2018-12-11 – 2018-12-13 (×4): 50 mg via ORAL
  Filled 2018-12-11 (×4): qty 1

## 2018-12-11 MED ORDER — IOHEXOL 350 MG/ML SOLN
60.0000 mL | Freq: Once | INTRAVENOUS | Status: AC | PRN
  Administered 2018-12-11: 60 mL via INTRAVENOUS

## 2018-12-11 MED ORDER — ATORVASTATIN CALCIUM 20 MG PO TABS
10.0000 mg | ORAL_TABLET | Freq: Every day | ORAL | Status: DC
  Administered 2018-12-11 – 2018-12-12 (×2): 10 mg via ORAL
  Filled 2018-12-11 (×2): qty 1

## 2018-12-11 MED ORDER — FUROSEMIDE 10 MG/ML IJ SOLN
40.0000 mg | Freq: Once | INTRAMUSCULAR | Status: AC
  Administered 2018-12-11: 40 mg via INTRAVENOUS
  Filled 2018-12-11: qty 4

## 2018-12-11 MED ORDER — AMLODIPINE BESYLATE 5 MG PO TABS
5.0000 mg | ORAL_TABLET | Freq: Every day | ORAL | Status: DC
  Administered 2018-12-12 – 2018-12-13 (×2): 5 mg via ORAL
  Filled 2018-12-11 (×2): qty 1

## 2018-12-11 MED ORDER — ALLOPURINOL 100 MG PO TABS
100.0000 mg | ORAL_TABLET | Freq: Every day | ORAL | Status: DC
  Administered 2018-12-12 – 2018-12-13 (×2): 100 mg via ORAL
  Filled 2018-12-11 (×2): qty 1

## 2018-12-11 MED ORDER — ENOXAPARIN SODIUM 40 MG/0.4ML ~~LOC~~ SOLN
40.0000 mg | SUBCUTANEOUS | Status: DC
  Filled 2018-12-11: qty 0.4

## 2018-12-11 MED ORDER — METHYLPREDNISOLONE SODIUM SUCC 40 MG IJ SOLR
40.0000 mg | Freq: Two times a day (BID) | INTRAMUSCULAR | Status: DC
  Administered 2018-12-11 – 2018-12-13 (×5): 40 mg via INTRAVENOUS
  Filled 2018-12-11 (×5): qty 1

## 2018-12-11 MED ORDER — ASPIRIN EC 81 MG PO TBEC
81.0000 mg | DELAYED_RELEASE_TABLET | Freq: Every day | ORAL | Status: DC
  Administered 2018-12-12 – 2018-12-13 (×2): 81 mg via ORAL
  Filled 2018-12-11 (×2): qty 1

## 2018-12-11 MED ORDER — LORAZEPAM 0.5 MG PO TABS
0.5000 mg | ORAL_TABLET | Freq: Four times a day (QID) | ORAL | Status: DC | PRN
  Administered 2018-12-12: 23:00:00 0.5 mg via ORAL
  Filled 2018-12-11: qty 1

## 2018-12-11 MED ORDER — ATORVASTATIN CALCIUM 20 MG PO TABS: 10.0000 mg | ORAL_TABLET | Freq: Every day | ORAL | Status: DC

## 2018-12-11 MED ORDER — LISINOPRIL 20 MG PO TABS: 40.0000 mg | ORAL_TABLET | Freq: Every day | ORAL | Status: DC

## 2018-12-11 MED ORDER — ACETAMINOPHEN 325 MG PO TABS: 650.0000 mg | ORAL_TABLET | Freq: Four times a day (QID) | ORAL | Status: DC | PRN

## 2018-12-11 MED ORDER — INSULIN ASPART 100 UNIT/ML ~~LOC~~ SOLN
0.0000 [IU] | Freq: Three times a day (TID) | SUBCUTANEOUS | Status: DC
  Filled 2018-12-11: qty 1

## 2018-12-11 NOTE — ED Triage Notes (Signed)
Pt sent from Dr. Lars Mage office for increased SOB since Friday. Denies cough or congestion

## 2018-12-11 NOTE — ED Provider Notes (Signed)
[facility] Emergency Department Provider Note   ____________________________________________    I have reviewed the triage vital signs and the nursing notes.   HISTORY  Chief Complaint Shortness of Breath     HPI [name] [name] is a 85 y.o. male who presents with shortness of breath.  Patient went to see his PCP for shortness of breath over the last several days, this is quite unusual for him.  No history of COPD.  No significant cough fevers chills or chest pain.  No exposure to COVID-19 patients that he knows of.  No recent travel.  No calf pain or swelling.  Has not taken anything for this.  Sent to the emergency department for further evaluation.  Past Medical History:  Diagnosis Date  . Arthritis    right shoulder  . Asthma   . BPH (benign prostatic hypertrophy)   . Gout   . Heart murmur   . History of chicken pox   . Hypercholesterolemia   . Hypertension   . Wears dentures    full upper and lower    Patient Active Problem List   Diagnosis Date Noted  . SOB (shortness of breath) 12/11/2018  . Hypoxia 12/11/2018  . Right shoulder pain 05/08/2015  . Health care maintenance 11/12/2014  . BMI 32.0-32.9,adult 06/18/2014  . Elevated PSA 02/17/2014  . Renal insufficiency 05/21/2012  . Gout 05/21/2012  . Hypertension 05/02/2012  . Hypercholesterolemia 05/02/2012  . Diabetes ([place]) 05/02/2012    Past Surgical History:  Procedure Laterality Date  . arm surgery     right arm fx s/p "plate insertion"  . CATARACT EXTRACTION W/PHACO Right 11/17/2016   Procedure: CATARACT EXTRACTION PHACO AND INTRAOCULAR LENS PLACEMENT ([place])  Right;  Surgeon: Leandrew Koyanagi, MD;  Location: [facility];  Service: Ophthalmology;  Laterality: Right;  . DENTAL SURGERY     all teeth extracted    Prior to Admission medications   Medication Sig Start Date End Date Taking? Authorizing Provider  allopurinol (ZYLOPRIM) 100 MG tablet TAKE 1 TABLET  EVERY DAY 04/12/18  Yes Einar Pheasant, MD  amLODipine (NORVASC) 5 MG tablet TAKE 1 TABLET EVERY DAY 04/12/18  Yes Einar Pheasant, MD  aspirin 81 MG tablet Take 81 mg by mouth daily.   Yes [provider]  atorvastatin (LIPITOR) 10 MG tablet TAKE 1 TABLET EVERY DAY 11/14/18  Yes Einar Pheasant, MD  colchicine (COLCRYS) 0.6 MG tablet Take 1 tablet (0.6 mg total) by mouth 2 (two) times daily as needed. 07/20/17  Yes Einar Pheasant, MD  hydrochlorothiazide (MICROZIDE) 12.5 MG capsule TAKE 1 CAPSULE EVERY DAY 11/14/18  Yes Einar Pheasant, MD  lisinopril (PRINIVIL,ZESTRIL) 40 MG tablet TAKE 1 TABLET EVERY DAY 07/10/18  Yes Einar Pheasant, MD  LORazepam (ATIVAN) 1 MG tablet Takes 1/2 to 1 tablet q day prn 11/17/17  Yes Einar Pheasant, MD  metoprolol succinate (TOPROL-XL) 50 MG 24 hr tablet TAKE 1 TABLET TWO TIMES DAILY WITH OR IMMEDIATELY FOLLOWING A MEAL. 07/10/18  Yes Einar Pheasant, MD     Allergies Patient has no known allergies.  Family History  Problem Relation Age of Onset  . Leukemia Father   . Congestive Heart Failure Mother   . Cancer Daughter        Breast Cancer  . Prostate cancer Neg Hx   . Colon cancer Neg Hx     Social History Social History   Tobacco Use  . Smoking status: Never Smoker  . Smokeless tobacco: Current  User    Types: Chew  Substance Use Topics  . Alcohol use: No    Alcohol/week: 0.0 standard drinks  . Drug use: No    Review of Systems  Constitutional: No fever/chills Eyes: No visual changes.  ENT: No sore throat. Cardiovascular: Denies chest pain. Respiratory: As above Gastrointestinal: No abdominal pain.  No nausea, no vomiting.   Genitourinary: Negative for dysuria. Musculoskeletal: Negative for back pain. Skin: Negative for rash. Neurological: Negative for headaches or weakness   ____________________________________________   PHYSICAL EXAM:  VITAL SIGNS: ED Triage Vitals [12/11/18 0914]  Enc Vitals Group     BP 132/65      Pulse Rate 63     Resp (!) 22     Temp 97.8 F (36.6 C)     Temp Source Oral     SpO2 (!) 89 %     Weight 98 kg (216 lb)     Height 1.829 m (6')     Head Circumference      Peak Flow      Pain Score 0     Pain Loc      Pain Edu?      Excl. in [place]?     Constitutional: Alert and oriented.  Eyes: Conjunctivae are normal.  Head: Atraumatic. Nose: No congestion/rhinnorhea. Mouth/Throat: Mucous membranes are moist.    Cardiovascular: Normal rate, regular rhythm. Grossly normal heart sounds.  Good peripheral circulation. Respiratory: Normal respiratory effort.  No retractions. Lungs CTAB.  No wheezing Gastrointestinal: Soft and nontender. No distention.  No CVA tenderness.  Musculoskeletal: No lower extremity tenderness nor edema.  Warm and well perfused Neurologic:  Normal speech and language. No gross focal neurologic deficits are appreciated.  Skin:  Skin is warm, dry and intact. No rash noted. Psychiatric: Mood and affect are normal. Speech and behavior are normal.  ____________________________________________   LABS (all labs ordered are listed, but only abnormal results are displayed)  Labs Reviewed  COMPREHENSIVE METABOLIC PANEL - Abnormal; Notable for the following components:      Result Value   CO2 18 (*)    Glucose, Bld 143 (*)    BUN 38 (*)    Creatinine, Ser 1.79 (*)    Total Bilirubin 1.5 (*)    GFR calc non Af Amer 34 (*)    GFR calc Af Amer 39 (*)    All other components within normal limits  TROPONIN I - Abnormal; Notable for the following components:   Troponin I 0.04 (*)    All other components within normal limits  BRAIN NATRIURETIC PEPTIDE - Abnormal; Notable for the following components:   B Natriuretic Peptide 3,449.0 (*)    All other components within normal limits  LACTIC ACID, PLASMA - Abnormal; Notable for the following components:   Lactic Acid, Venous 2.5 (*)    All other components within normal limits  SARS CORONAVIRUS 2 (HOSPITAL ORDER,  [place] [facility])  CBC WITH DIFFERENTIAL/PLATELET  LACTIC ACID, PLASMA   ____________________________________________  EKG  ED ECG REPORT I, Lavonia Drafts, the attending physician, personally viewed and interpreted this ECG.  Date: 12/11/2018  Rhythm: normal sinus rhythm QRS Axis: normal Intervals: normal ST/T Wave abnormalities: Nonspecific changes Narrative Interpretation: no evidence of acute ischemia  ____________________________________________  RADIOLOGY  Chest x-ray possible atelectasis viewed by me ____________________________________________   PROCEDURES  Procedure(s) performed: No  Procedures   Critical Care performed: yes  CRITICAL CARE Performed by: Lavonia Drafts   Total critical care  time: 30 minutes  Critical care time was exclusive of separately billable procedures and treating other patients.  Critical care was necessary to treat or prevent imminent or life-threatening deterioration.  Critical care was time spent personally by me on the following activities: development of treatment plan with patient and/or surrogate as well as nursing, discussions with consultants, evaluation of patient's response to treatment, examination of patient, obtaining history from patient or surrogate, ordering and performing treatments and interventions, ordering and review of laboratory studies, ordering and review of radiographic studies, pulse oximetry and re-evaluation of patient's condition.  ____________________________________________   INITIAL IMPRESSION / ASSESSMENT AND PLAN / ED COURSE  Pertinent labs & imaging results that were available during my care of the patient were reviewed by me and considered in my medical decision making (see chart for details).  Patient presents with worsening shortness of breath over the last 2 to 3 days, he is satting in the mid to high 80s on room air, started on nasal cannula which will bring him to 90  to 92%.  Afebrile, chest x-ray not overly suggestive of pneumonia.  No groundglass opacity suggestive of COVID-19 however this is certainly on the differential.  Will swab, check labs, obtain CT angiography in case this is caused by PE.  COVID test is negative  CT angiography pending   CT angiography negative for PE, no clear explanation for shortness of breath, requiring oxygen which is new for him, will require admission for further evaluation.  Mild elevation in troponin is nonspecific    ____________________________________________   FINAL CLINICAL IMPRESSION(S) / ED DIAGNOSES  Final diagnoses:  SOB (shortness of breath)        Note:  This document was prepared using Dragon voice recognition software and may include unintentional dictation errors.   Lavonia Drafts, MD 12/11/18 [phone]

## 2018-12-11 NOTE — ED Notes (Signed)
Patient transported to CT 

## 2018-12-11 NOTE — Progress Notes (Signed)
Patient ID: [name] [name], male   DOB: April 29, 1931, 85 y.o.   MRN: [id number]   Subjective:    Patient ID: [name] [name], male    DOB: 04-21-1931, 85 y.o.   MRN: [id number]  HPI  Patient here for his physical exam.  Was scheduled for a physical, but on questioning he reports noticing he does not feel as good.  Reports some sob.  Some throat congestion and some tightness in his chest.  No chest pain.  Reports starting 3-4 weeks ago, noticing some decrease appetite.  Appetite has started to improve, but does not eat regular meals.  No acid reflux.  No abdominal pain.  Bowels moving.     Past Medical History:  Diagnosis Date  . Arthritis    right shoulder  . Asthma   . BPH (benign prostatic hypertrophy)   . Gout   . Heart murmur   . History of chicken pox   . Hypercholesterolemia   . Hypertension   . Wears dentures    full upper and lower   Past Surgical History:  Procedure Laterality Date  . arm surgery     right arm fx s/p "plate insertion"  . CATARACT EXTRACTION W/PHACO Right 11/17/2016   Procedure: CATARACT EXTRACTION PHACO AND INTRAOCULAR LENS PLACEMENT ([place])  Right;  Surgeon: Leandrew Koyanagi, MD;  Location: [facility];  Service: Ophthalmology;  Laterality: Right;  . DENTAL SURGERY     all teeth extracted   Family History  Problem Relation Age of Onset  . Leukemia Father   . Congestive Heart Failure Mother   . Cancer Daughter        Breast Cancer  . Prostate cancer Neg Hx   . Colon cancer Neg Hx    Social History   Socioeconomic History  . Marital status: Married    Spouse name: Not on file  . Number of children: Not on file  . Years of education: Not on file  . Highest education level: Not on file  Occupational History  . Not on file  Social Needs  . Financial resource strain: Not hard at all  . Food insecurity    Worry: Never true    Inability: Never true  . Transportation needs    Medical: No    Non-medical: No  Tobacco Use  .  Smoking status: Never Smoker  . Smokeless tobacco: Current User    Types: Chew  Substance and Sexual Activity  . Alcohol use: No    Alcohol/week: 0.0 standard drinks  . Drug use: No  . Sexual activity: Not Currently  Lifestyle  . Physical activity    Days per week: 0 days    Minutes per session: Not on file  . Stress: Not at all  Relationships  . Social Herbalist on phone: Not on file    Gets together: Not on file    Attends religious service: Not on file    Active member of club or organization: Not on file    Attends meetings of clubs or organizations: Not on file    Relationship status: Not on file  Other Topics Concern  . Not on file  Social History Narrative  . Not on file    Outpatient Encounter Medications as of 12/11/2018  Medication Sig  . allopurinol (ZYLOPRIM) 100 MG tablet TAKE 1 TABLET EVERY DAY  . amLODipine (NORVASC) 5 MG tablet TAKE 1 TABLET EVERY DAY  . aspirin 81 MG tablet Take 81 mg  by mouth daily.  Marland Kitchen atorvastatin (LIPITOR) 10 MG tablet TAKE 1 TABLET EVERY DAY  . colchicine (COLCRYS) 0.6 MG tablet Take 1 tablet (0.6 mg total) by mouth 2 (two) times daily as needed.  . hydrochlorothiazide (MICROZIDE) 12.5 MG capsule TAKE 1 CAPSULE EVERY DAY  . lisinopril (PRINIVIL,ZESTRIL) 40 MG tablet TAKE 1 TABLET EVERY DAY  . LORazepam (ATIVAN) 1 MG tablet Takes 1/2 to 1 tablet q day prn  . metoprolol succinate (TOPROL-XL) 50 MG 24 hr tablet TAKE 1 TABLET TWO TIMES DAILY WITH OR IMMEDIATELY FOLLOWING A MEAL.   No facility-administered encounter medications on file as of 12/11/2018.     Review of Systems  Constitutional: Positive for fatigue. Negative for fever.       Has lost weight.    HENT: Positive for congestion. Negative for sinus pressure.        Throat congestion.   Eyes: Negative for pain and visual disturbance.  Respiratory: Positive for chest tightness and shortness of breath.        No significant increased cough.   Cardiovascular: Negative  for chest pain, palpitations and leg swelling.  Gastrointestinal: Negative for abdominal pain, diarrhea, nausea and vomiting.  Genitourinary: Negative for difficulty urinating and dysuria.  Musculoskeletal: Negative for joint swelling and myalgias.  Skin: Negative for color change and rash.  Neurological: Negative for dizziness, light-headedness and headaches.  Hematological: Negative for adenopathy. Does not bruise/bleed easily.  Psychiatric/Behavioral: Negative for agitation and dysphoric mood.       Objective:    Physical Exam Constitutional:      Appearance: Normal appearance. He is well-developed.     Comments: Appears to not feel well.    HENT:     Head: Normocephalic and atraumatic.  Eyes:     General:        Right eye: No discharge.        Left eye: No discharge.     Conjunctiva/sclera: Conjunctivae normal.  Neck:     Musculoskeletal: Neck supple. No muscular tenderness.     Thyroid: No thyromegaly.  Cardiovascular:     Rate and Rhythm: Normal rate and regular rhythm.     Comments: 2/6 systolic murmur Pulmonary:     Effort: No respiratory distress.     Breath sounds: Normal breath sounds. No wheezing.  Abdominal:     General: Bowel sounds are normal.     Palpations: Abdomen is soft.     Tenderness: There is no abdominal tenderness.  Genitourinary:    Comments: Not performed.   Musculoskeletal:        General: No tenderness.  Lymphadenopathy:     Cervical: No cervical adenopathy.  Skin:    General: Skin is warm and dry.     Findings: No rash.  Neurological:     Mental Status: He is alert and oriented to person, place, and time.  Psychiatric:        Mood and Affect: Mood normal.        Behavior: Behavior normal.     BP 120/72   Pulse 66   Temp 97.6 F (36.4 C) (Oral)   Resp 16   Wt 216 lb 9.6 oz (98.2 kg)   SpO2 90%   BMI 31.08 kg/m  Wt Readings from Last 3 Encounters:  12/11/18 216 lb 9.6 oz (98.2 kg)  05/22/18 230 lb (104.3 kg)  11/17/17 229  lb 6.4 oz (104.1 kg)     Lab Results  Component Value Date   WBC 9.0  08/09/2018   HGB 14.3 08/09/2018   HCT 41.8 08/09/2018   PLT 188.0 08/09/2018   GLUCOSE 99 08/09/2018   CHOL 111 08/09/2018   TRIG 50.0 08/09/2018   HDL 36.20 (L) 08/09/2018   LDLCALC 65 08/09/2018   ALT 10 08/09/2018   AST 17 08/09/2018   NA 137 08/09/2018   K 4.1 08/09/2018   CL 104 08/09/2018   CREATININE 1.27 08/09/2018   BUN 19 08/09/2018   CO2 26 08/09/2018   TSH 2.61 03/21/2018   PSA 4.15 (H) 02/08/2014   HGBA1C 5.8 08/09/2018   MICROALBUR 34.6 (H) 08/09/2018       Assessment & Plan:   Problem List Items Addressed This Visit    Diabetes ([place])    Sugars have been controlled.  Follow met b and a1c.       Hypertension    Blood pressure under good control.        Hypoxia    Unclear etiology.  O2 sats dropped with walking back to room - 86-89%.  Some tightness in his throat/upper chest.  Some throat congestion.  2/6 systolic murmur.   Discussed the need for further testing.  Pt in agreement.  No known COVID exposure.  Discussed further testing and w/up.  Agreed to ER evaluation.  ER notified.        SOB (shortness of breath)    More sob on exam.  SOB walking to room.  Pulse ox 86-89% with exertion - walking to room.  Normal pulse ox 98%.  States has noticed some tightness in his throat/upper chest.  No chest pain.  Some throat congestion.  No acid reflux.  No known fever.  No known covid exposure.  Decreased appetite with weight loss.  Just does not feel as well.  Discussed the sob with decreased O2 sats and current symptoms.  Discussed need for further w/up.  Agrees with need for further evaluation and ER evaluation.  ER notified.         Other Visit Diagnoses    Dyspnea, unspecified type    -  Primary       Einar Pheasant, MD

## 2018-12-11 NOTE — ED Notes (Signed)
Increased O2 to 3L/ [place].  Patietn AAOx3.  Skin warm and dry.  Respirations regular and non labored.  NAD

## 2018-12-11 NOTE — ED Notes (Signed)
Pt denies any pain or SHOB however he appears to be slightly SHOB with talking - O2 sat 94% on 3L via n/c

## 2018-12-11 NOTE — Assessment & Plan Note (Signed)
Sugars have been controlled.  Follow met b and a1c.  

## 2018-12-11 NOTE — Assessment & Plan Note (Signed)
Blood pressure under good control 

## 2018-12-11 NOTE — ED Notes (Signed)
Report given to Megan RN

## 2018-12-11 NOTE — ED Notes (Signed)
Pt desat to 87% - he had moved oxygen and it was on his cheek - O2 replaced at 3L via n/c and O2 sat improved to 92% - increased O2 to 4L

## 2018-12-11 NOTE — H&P (Signed)
[name] [name] NAME: [name] [name]    MR#:  [id number]  DATE OF BIRTH:  1930/07/25  DATE OF ADMISSION:  12/11/2018  PRIMARY CARE PHYSICIAN: Einar Pheasant, MD   REQUESTING/REFERRING PHYSICIAN: Dr Lavonia Drafts  CHIEF COMPLAINT:   Chief Complaint  Patient presents with  . Shortness of Breath    HISTORY OF PRESENT ILLNESS:  [name] [name]  is a 85 y.o. male with a known history of hypertension presents with shortness of breath going on for at least 3 weeks.  He has been having some coughing and some wheezing.  Cough is nonproductive.  Worse with ambulation.  No complaints of chest pain.  Having some weight loss.  Some fatigue.  CT scan of the chest was negative for pulmonary embolism but did show some lymph nodes and small airway disease.  BNP was also high.  PAST MEDICAL HISTORY:   Past Medical History:  Diagnosis Date  . Arthritis    right shoulder  . Asthma   . BPH (benign prostatic hypertrophy)   . Gout   . Heart murmur   . History of chicken pox   . Hypercholesterolemia   . Hypertension   . Wears dentures    full upper and lower    PAST SURGICAL HISTORY:   Past Surgical History:  Procedure Laterality Date  . arm surgery     right arm fx s/p "plate insertion"  . CATARACT EXTRACTION W/PHACO Right 11/17/2016   Procedure: CATARACT EXTRACTION PHACO AND INTRAOCULAR LENS PLACEMENT ([place])  Right;  Surgeon: Leandrew Koyanagi, MD;  Location: [facility];  Service: Ophthalmology;  Laterality: Right;  . DENTAL SURGERY     all teeth extracted    SOCIAL HISTORY:   Social History   Tobacco Use  . Smoking status: Never Smoker  . Smokeless tobacco: Current User    Types: Chew  Substance Use Topics  . Alcohol use: No    Alcohol/week: 0.0 standard drinks    FAMILY HISTORY:   Family History  Problem Relation Age of Onset  . Leukemia Father   . Congestive Heart Failure Mother   . Cancer Daughter         Breast Cancer  . Prostate cancer Neg Hx   . Colon cancer Neg Hx     DRUG ALLERGIES:  No Known Allergies  REVIEW OF SYSTEMS:  CONSTITUTIONAL: No fever, chills or sweats.  Positive weight loss.  Positive for fatigue.  EYES: No blurred or double vision.  EARS, NOSE, AND THROAT: No tinnitus or ear pain. No sore throat RESPIRATORY: Positive for nonproductive cough.  Positive for shortness of breath on exertion.  Positive for wheezing.  No hemoptysis.  CARDIOVASCULAR: No chest pain, orthopnea.  Some edema.  GASTROINTESTINAL: No nausea, vomiting, diarrhea or abdominal pain. No blood in bowel movements GENITOURINARY: No dysuria, hematuria.  ENDOCRINE: No polyuria, nocturia,  HEMATOLOGY: No anemia, easy bruising or bleeding SKIN: No rash or lesion. MUSCULOSKELETAL: No joint pain or arthritis.   NEUROLOGIC: No tingling, numbness, weakness.  PSYCHIATRY: No anxiety or depression.   MEDICATIONS AT HOME:   Prior to Admission medications   Medication Sig Start Date End Date Taking? Authorizing Provider  allopurinol (ZYLOPRIM) 100 MG tablet TAKE 1 TABLET EVERY DAY 04/12/18  Yes Einar Pheasant, MD  amLODipine (NORVASC) 5 MG tablet TAKE 1 TABLET EVERY DAY 04/12/18  Yes Einar Pheasant, MD  aspirin 81 MG tablet Take 81 mg by mouth daily.   Yes [provider]  atorvastatin (LIPITOR) 10 MG tablet TAKE 1 TABLET EVERY DAY 11/14/18  Yes Einar Pheasant, MD  colchicine (COLCRYS) 0.6 MG tablet Take 1 tablet (0.6 mg total) by mouth 2 (two) times daily as needed. 07/20/17  Yes Einar Pheasant, MD  hydrochlorothiazide (MICROZIDE) 12.5 MG capsule TAKE 1 CAPSULE EVERY DAY 11/14/18  Yes Einar Pheasant, MD  lisinopril (PRINIVIL,ZESTRIL) 40 MG tablet TAKE 1 TABLET EVERY DAY 07/10/18  Yes Einar Pheasant, MD  LORazepam (ATIVAN) 1 MG tablet Takes 1/2 to 1 tablet q day prn 11/17/17  Yes Einar Pheasant, MD  metoprolol succinate (TOPROL-XL) 50 MG 24 hr tablet TAKE 1 TABLET TWO TIMES DAILY WITH OR  IMMEDIATELY FOLLOWING A MEAL. 07/10/18  Yes Einar Pheasant, MD      VITAL SIGNS:  Blood pressure 129/66, pulse 60, temperature 97.8 F (36.6 C), temperature source Oral, resp. rate (!) 21, height 6' (1.829 m), weight 98 kg, SpO2 93 %.  PHYSICAL EXAMINATION:  GENERAL:  85 y.o.-year-old patient lying in the bed with no acute distress.  EYES: Pupils equal, round, reactive to light and accommodation. No scleral icterus. Extraocular muscles intact.  HEENT: Head atraumatic, normocephalic. Oropharynx and nasopharynx clear.  NECK:  Supple, no jugular venous distention. No thyroid enlargement, no tenderness.  LUNGS: Decreased breath sounds bilaterally, no wheezing, rales,rhonchi or crepitation. No use of accessory muscles of respiration.  CARDIOVASCULAR: S1, S2 normal. No murmurs, rubs, or gallops.  ABDOMEN: Soft, nontender, nondistended. Bowel sounds present. No organomegaly or mass.  EXTREMITIES: No pedal edema, cyanosis, or clubbing.  NEUROLOGIC: Cranial nerves II through XII are intact. Muscle strength 5/5 in all extremities. Sensation intact. Gait not checked.  PSYCHIATRIC: The patient is alert and oriented x 3.  SKIN: No rash, lesion, or ulcer.   LABORATORY PANEL:   CBC Recent Labs  Lab 12/11/18 0927  WBC 10.0  HGB 14.3  HCT 42.0  PLT 204   ------------------------------------------------------------------------------------------------------------------  Chemistries  Recent Labs  Lab 12/11/18 0927  NA 135  K 4.3  CL 105  CO2 18*  GLUCOSE 143*  BUN 38*  CREATININE 1.79*  CALCIUM 9.9  AST 22  ALT 12  ALKPHOS 62  BILITOT 1.5*   ------------------------------------------------------------------------------------------------------------------  Cardiac Enzymes Recent Labs  Lab 12/11/18 0927  TROPONINI 0.04*   ------------------------------------------------------------------------------------------------------------------  RADIOLOGY:  Ct Angio Chest Pe W And/or Wo  Contrast  Result Date: 12/11/2018 CLINICAL DATA:  Shortness of breath EXAM: CT ANGIOGRAPHY CHEST WITH CONTRAST TECHNIQUE: Multidetector CT imaging of the chest was performed using the standard protocol during bolus administration of intravenous contrast. Multiplanar CT image reconstructions and MIPs were obtained to evaluate the vascular anatomy. CONTRAST:  90mL OMNIPAQUE IOHEXOL 350 MG/ML SOLN COMPARISON:  Chest radiograph December 11, 2018 FINDINGS: Cardiovascular: There is no demonstrable pulmonary embolus. The ascending thoracic aorta measures 4.1 x 4.1 cm in transverse diameter. No dissection is seen. Note that the contrast bolus within the aorta is not sufficient for confident exclusion of dissection is a differential consideration radiographically. There are foci calcification in the visualized great vessels. There are foci of aortic atherosclerosis. There are foci of coronary artery calcification at multiple sites. There is no pericardial effusion or pericardial thickening evident. The main pulmonary outflow tract measures 3.2 cm, prominent. Mediastinum/Nodes: Visualized thyroid appears unremarkable. There are multiple subcentimeter mediastinal lymph nodes. There are multiple calcified lymph nodes consistent with prior granulomatous disease. There is a prominent right hilar lymph node measuring 1.5 x 1.2 cm. A second right hilar lymph node measures 1.3  x 1.1 cm. There is a subcarinal lymph node measuring 1.3 x 1.2 cm. No esophageal lesions evident. Lungs/Pleura: There are sizable free-flowing pleural effusions bilaterally with compressive atelectasis in each lung base posteriorly. Elsewhere, there is patchy atelectasis in the upper lobes with a somewhat mosaic attenuation bilaterally. Upper Abdomen: In the visualized upper abdomen, there is aortic atherosclerosis. There are occasional calcified splenic granulomas. The gallbladder appears somewhat distended without wall thickening. Musculoskeletal: There is  degenerative change in the thoracic and upper lumbar regions. No blastic or lytic bone lesions are evident. There is degenerative change in each shoulder. No chest wall lesions are evident. Review of the MIP images confirms the above findings. IMPRESSION: 1.  No demonstrable pulmonary embolus. 2. Prominence of the ascending thoracic aorta measuring 4.1 x 4.1 cm in transverse diameter. There is aortic atherosclerosis as well as foci of great vessel and coronary artery calcification. No dissection evident; the contrast bolus in the aorta is not sufficient for meaningful assessment for potential dissection. Recommend annual imaging followup by CTA or MRA. This recommendation follows 2010 ACCF/AHA/AATS/ACR/ASA/SCA/SCAI/SIR/STS/SVM Guidelines for the Diagnosis and Management of Patients with Thoracic Aortic Disease. Circulation. 2010; 121: M767-M094. Aortic aneurysm NOS (ICD10-I71.9). 3. Prominence of the main pulmonary outflow tract, an appearance indicative of a degree of pulmonary arterial hypertension. 4. Sizable free-flowing pleural effusions bilaterally with bibasilar atelectatic change. Mosaic attenuation elsewhere raises question of underlying small airways obstructive disease. 5. Several prominent lymph nodes of uncertain etiology. Evidence of prior granulomatous disease with several calcified mediastinal lymph nodes. There is also calcification in the splenic artery consistent with prior granulomatous disease. Aortic Atherosclerosis (ICD10-I70.0). Electronically Signed   By: Lowella Grip III M.D.   On: 12/11/2018 11:25   Dg Chest Port 1 View  Result Date: 12/11/2018 CLINICAL DATA:  Shortness of breath. EXAM: PORTABLE CHEST 1 VIEW COMPARISON:  None. FINDINGS: The heart size and mediastinal contours are within normal limits. No pneumothorax is noted. Mild bibasilar subsegmental atelectasis is noted with minimal pleural effusions. The visualized skeletal structures are unremarkable. IMPRESSION: Mild  bibasilar subsegmental atelectasis is noted with minimal pleural effusions. Aortic Atherosclerosis (ICD10-I70.0). Electronically Signed   By: Marijo Conception M.D.   On: 12/11/2018 09:47    EKG:   Sinus rhythm 60 bpm, left atrial enlargement, Q waves septally, prolonged QT.  IMPRESSION AND PLAN:   1.  Shortness of breath with CT scan showing small airway disease and lymphadenopathy.  Will start steroids and nebulizer treatments.  Get a pulmonary consultation. 2.  Elevated BNP.  Prominent pulmonary outflow markings seen on chest x-ray.  Obtain an echocardiogram.  Give 1 dose of Lasix and monitor. 3.  Impaired fasting glucose.  She send off a hemoglobin A1c 4.  Hypertension continue usual medications 5.  Elevation in BUN and creatinine.  Patient had a CT scan so we will need to watch creatinine closely. 6.  Initial lactic acidosis then it came down.   All the records are reviewed and case discussed with ED provider. Management plans discussed with the patient, and he is in agreement.  CODE STATUS: Full code  TOTAL TIME TAKING CARE OF THIS PATIENT: 50 minutes.    Loletha Grayer M.D on 12/11/2018 at 12:49 PM  Between 7am to 6pm - Pager - [phone]  After 6pm call admission pager [phone]  [facility]  [phone]  CC: Primary care physician; Einar Pheasant, MD

## 2018-12-11 NOTE — ED Notes (Signed)
ED TO INPATIENT HANDOFF REPORT  ED Nurse Name and Phone #: Helene Kelp [phone]  S Name/Age/Gender [name] [name] 85 y.o. male Room/Bed: ED06A/ED06A  Code Status   Code Status: Full Code  Home/SNF/Other Home Patient oriented to: self, place, time and situation Is this baseline? Yes   Triage Complete: Triage complete  Chief Complaint sob  Triage Note Pt sent from Dr. Lars Mage office for increased SOB since Friday. Denies cough or congestion   Allergies No Known Allergies  Level of Care/Admitting Diagnosis ED Disposition    ED Disposition Condition [facility] Area: No Name [100120]  Level of Care: Med-Surg [16]  Covid Evaluation: Confirmed COVID Negative  Diagnosis: Shortness of breath [786.05.ICD-9-CM]  Admitting Physician: Loletha Grayer [[id number]]  Attending Physician: Loletha Grayer [[id number]]  Estimated length of stay: past midnight tomorrow  Certification:: I certify this patient will need inpatient services for at least 2 midnights  PT Class (Do Not Modify): Inpatient [101]  PT Acc Code (Do Not Modify): Private [1]       B Medical/Surgery History Past Medical History:  Diagnosis Date  . Arthritis    right shoulder  . Asthma   . BPH (benign prostatic hypertrophy)   . Gout   . Heart murmur   . History of chicken pox   . Hypercholesterolemia   . Hypertension   . Wears dentures    full upper and lower   Past Surgical History:  Procedure Laterality Date  . arm surgery     right arm fx s/p "plate insertion"  . CATARACT EXTRACTION W/PHACO Right 11/17/2016   Procedure: CATARACT EXTRACTION PHACO AND INTRAOCULAR LENS PLACEMENT ([place])  Right;  Surgeon: Leandrew Koyanagi, MD;  Location: [facility];  Service: Ophthalmology;  Laterality: Right;  . DENTAL SURGERY     all teeth extracted     A IV Location/Drains/Wounds Patient Lines/Drains/Airways Status   Active Line/Drains/Airways    Name:   Placement date:    Placement time:   Site:   Days:   Peripheral IV 12/11/18 Right Antecubital   12/11/18    0928    Antecubital   less than 1   Incision (Closed) 11/17/16 Eye Other (Comment)   11/17/16    1002     754          Intake/Output Last 24 hours No intake or output data in the 24 hours ending 12/11/18 1311  Labs/Imaging Results for orders placed or performed during the hospital encounter of 12/11/18 (from the past 48 hour(s))  SARS Coronavirus 2 (CEPHEID- Performed in [facility] hospital lab), Hosp Order     Status: None   Collection Time: 12/11/18  9:27 AM   Specimen: Nasopharyngeal Swab  Result Value Ref Range   SARS Coronavirus 2 NEGATIVE NEGATIVE    Comment: (NOTE) If result is NEGATIVE SARS-CoV-2 target nucleic acids are NOT DETECTED. The SARS-CoV-2 RNA is generally detectable in upper and lower  respiratory specimens during the acute phase of infection. The lowest  concentration of SARS-CoV-2 viral copies this assay can detect is 250  copies / mL. A negative result does not preclude SARS-CoV-2 infection  and should not be used as the sole basis for treatment or other  patient management decisions.  A negative result may occur with  improper specimen collection / handling, submission of specimen other  than nasopharyngeal swab, presence of viral mutation(s) within the  areas targeted by this assay, and inadequate number of viral copies  (<  250 copies / mL). A negative result must be combined with clinical  observations, patient history, and epidemiological information. If result is POSITIVE SARS-CoV-2 target nucleic acids are DETECTED. The SARS-CoV-2 RNA is generally detectable in upper and lower  respiratory specimens dur ing the acute phase of infection.  Positive  results are indicative of active infection with SARS-CoV-2.  Clinical  correlation with patient history and other diagnostic information is  necessary to determine patient infection status.  Positive results do  not  rule out bacterial infection or co-infection with other viruses. If result is PRESUMPTIVE POSTIVE SARS-CoV-2 nucleic acids MAY BE PRESENT.   A presumptive positive result was obtained on the submitted specimen  and confirmed on repeat testing.  While 2019 novel coronavirus  (SARS-CoV-2) nucleic acids may be present in the submitted sample  additional confirmatory testing may be necessary for epidemiological  and / or clinical management purposes  to differentiate between  SARS-CoV-2 and other Sarbecovirus currently known to infect humans.  If clinically indicated additional testing with an alternate test  methodology [phone]) is advised. The SARS-CoV-2 RNA is generally  detectable in upper and lower respiratory sp ecimens during the acute  phase of infection. The expected result is Negative. Fact Sheet for Patients:  StrictlyIdeas.no Fact Sheet for Healthcare Providers: BankingDealers.co.za This test is not yet approved or cleared by the [place] FDA and has been authorized for detection and/or diagnosis of SARS-CoV-2 by FDA under an Emergency Use Authorization (EUA).  This EUA will remain in effect (meaning this test can be used) for the duration of the COVID-19 declaration under Section 564(b)(1) of the Act, 21 U.S.C. section 360bbb-3(b)(1), unless the authorization is terminated or revoked sooner. Performed at [facility], [address] [place], [place] [address]   CBC with Differential     Status: None   Collection Time: 12/11/18  9:27 AM  Result Value Ref Range   WBC 10.0 4.0 - 10.5 K/uL   RBC 4.22 4.22 - 5.81 MIL/uL   Hemoglobin 14.3 13.0 - 17.0 g/dL   HCT 42.0 39.0 - 52.0 %   MCV 99.5 80.0 - 100.0 fL   MCH 33.9 26.0 - 34.0 pg   MCHC 34.0 30.0 - 36.0 g/dL   RDW 14.6 11.5 - 15.5 %   Platelets 204 150 - 400 K/uL   nRBC 0.0 0.0 - 0.2 %   Neutrophils Relative % 64 %   Neutro Abs 6.4 1.7 - 7.7 K/uL    Lymphocytes Relative 26 %   Lymphs Abs 2.6 0.7 - 4.0 K/uL   Monocytes Relative 8 %   Monocytes Absolute 0.8 0.1 - 1.0 K/uL   Eosinophils Relative 1 %   Eosinophils Absolute 0.1 0.0 - 0.5 K/uL   Basophils Relative 1 %   Basophils Absolute 0.1 0.0 - 0.1 K/uL   Immature Granulocytes 0 %   Abs Immature Granulocytes 0.04 0.00 - 0.07 K/uL    Comment: Performed at [facility], [address] [place], [place] [address]  Comprehensive metabolic panel     Status: Abnormal   Collection Time: 12/11/18  9:27 AM  Result Value Ref Range   Sodium 135 135 - 145 mmol/L   Potassium 4.3 3.5 - 5.1 mmol/L   Chloride 105 98 - 111 mmol/L   CO2 18 (L) 22 - 32 mmol/L   Glucose, Bld 143 (H) 70 - 99 mg/dL   BUN 38 (H) 8 - 23 mg/dL   Creatinine, Ser 1.79 (H) 0.61 -  1.24 mg/dL   Calcium 9.9 8.9 - 10.3 mg/dL   Total Protein 7.7 6.5 - 8.1 g/dL   Albumin 4.6 3.5 - 5.0 g/dL   AST 22 15 - 41 U/L   ALT 12 0 - 44 U/L   Alkaline Phosphatase 62 38 - 126 U/L   Total Bilirubin 1.5 (H) 0.3 - 1.2 mg/dL   GFR calc non Af Amer 34 (L) >60 mL/min   GFR calc Af Amer 39 (L) >60 mL/min   Anion gap 12 5 - 15    Comment: Performed at [facility], [address] [place], [place] [address]  Troponin I - ONCE - STAT     Status: Abnormal   Collection Time: 12/11/18  9:27 AM  Result Value Ref Range   Troponin I 0.04 (HH) <0.03 ng/mL    Comment: CRITICAL RESULT CALLED TO, READ BACK BY AND VERIFIED WITH JESSICA FULCHER @1002  12/11/18 AKT Performed at [facility], [address], [place], [place] [address]   Brain natriuretic peptide     Status: Abnormal   Collection Time: 12/11/18  9:27 AM  Result Value Ref Range   B Natriuretic Peptide 3,449.0 (H) 0.0 - 100.0 pg/mL    Comment: Performed at [facility], [address] [place], [place] [address]  Lactic acid, plasma     Status: Abnormal   Collection Time: 12/11/18  9:27 AM  Result Value Ref Range   Lactic Acid, Venous 2.5 (HH) 0.5 -  1.9 mmol/L    Comment: CRITICAL RESULT CALLED TO, READ BACK BY AND VERIFIED WITH JESSICA FULCHER @0957  12/11/18 AKT Performed at [facility], [address] [place], [place] [address]   Lactic acid, plasma     Status: None   Collection Time: 12/11/18 11:20 AM  Result Value Ref Range   Lactic Acid, Venous 1.4 0.5 - 1.9 mmol/L    Comment: Performed at [facility], [place], [place] [address]   Ct Angio Chest Pe W And/or Wo Contrast  Result Date: 12/11/2018 CLINICAL DATA:  Shortness of breath EXAM: CT ANGIOGRAPHY CHEST WITH CONTRAST TECHNIQUE: Multidetector CT imaging of the chest was performed using the standard protocol during bolus administration of intravenous contrast. Multiplanar CT image reconstructions and MIPs were obtained to evaluate the vascular anatomy. CONTRAST:  8mL OMNIPAQUE IOHEXOL 350 MG/ML SOLN COMPARISON:  Chest radiograph December 11, 2018 FINDINGS: Cardiovascular: There is no demonstrable pulmonary embolus. The ascending thoracic aorta measures 4.1 x 4.1 cm in transverse diameter. No dissection is seen. Note that the contrast bolus within the aorta is not sufficient for confident exclusion of dissection is a differential consideration radiographically. There are foci calcification in the visualized great vessels. There are foci of aortic atherosclerosis. There are foci of coronary artery calcification at multiple sites. There is no pericardial effusion or pericardial thickening evident. The main pulmonary outflow tract measures 3.2 cm, prominent. Mediastinum/Nodes: Visualized thyroid appears unremarkable. There are multiple subcentimeter mediastinal lymph nodes. There are multiple calcified lymph nodes consistent with prior granulomatous disease. There is a prominent right hilar lymph node measuring 1.5 x 1.2 cm. A second right hilar lymph node measures 1.3 x 1.1 cm. There is a subcarinal lymph node measuring 1.3 x 1.2 cm. No esophageal lesions  evident. Lungs/Pleura: There are sizable free-flowing pleural effusions bilaterally with compressive atelectasis in each lung base posteriorly. Elsewhere, there is patchy atelectasis in the upper lobes with a somewhat mosaic attenuation bilaterally. Upper Abdomen: In the visualized upper abdomen,  there is aortic atherosclerosis. There are occasional calcified splenic granulomas. The gallbladder appears somewhat distended without wall thickening. Musculoskeletal: There is degenerative change in the thoracic and upper lumbar regions. No blastic or lytic bone lesions are evident. There is degenerative change in each shoulder. No chest wall lesions are evident. Review of the MIP images confirms the above findings. IMPRESSION: 1.  No demonstrable pulmonary embolus. 2. Prominence of the ascending thoracic aorta measuring 4.1 x 4.1 cm in transverse diameter. There is aortic atherosclerosis as well as foci of great vessel and coronary artery calcification. No dissection evident; the contrast bolus in the aorta is not sufficient for meaningful assessment for potential dissection. Recommend annual imaging followup by CTA or MRA. This recommendation follows 2010 ACCF/AHA/AATS/ACR/ASA/SCA/SCAI/SIR/STS/SVM Guidelines for the Diagnosis and Management of Patients with Thoracic Aortic Disease. Circulation. 2010; 121: X381-W299. Aortic aneurysm NOS (ICD10-I71.9). 3. Prominence of the main pulmonary outflow tract, an appearance indicative of a degree of pulmonary arterial hypertension. 4. Sizable free-flowing pleural effusions bilaterally with bibasilar atelectatic change. Mosaic attenuation elsewhere raises question of underlying small airways obstructive disease. 5. Several prominent lymph nodes of uncertain etiology. Evidence of prior granulomatous disease with several calcified mediastinal lymph nodes. There is also calcification in the splenic artery consistent with prior granulomatous disease. Aortic Atherosclerosis  (ICD10-I70.0). Electronically Signed   By: Lowella Grip III M.D.   On: 12/11/2018 11:25   Dg Chest Port 1 View  Result Date: 12/11/2018 CLINICAL DATA:  Shortness of breath. EXAM: PORTABLE CHEST 1 VIEW COMPARISON:  None. FINDINGS: The heart size and mediastinal contours are within normal limits. No pneumothorax is noted. Mild bibasilar subsegmental atelectasis is noted with minimal pleural effusions. The visualized skeletal structures are unremarkable. IMPRESSION: Mild bibasilar subsegmental atelectasis is noted with minimal pleural effusions. Aortic Atherosclerosis (ICD10-I70.0). Electronically Signed   By: Marijo Conception M.D.   On: 12/11/2018 09:47    Pending Labs Unresulted Labs (From admission, onward)    Start     Ordered   12/18/18 0500  Creatinine, serum  (enoxaparin (LOVENOX)    CrCl >/= 30 ml/min)  Weekly,   STAT    Comments: while on enoxaparin therapy    12/11/18 1247   12/12/18 3716  Basic metabolic panel  Tomorrow morning,   STAT     12/11/18 1247   12/12/18 0500  CBC  Tomorrow morning,   STAT     12/11/18 1247   12/11/18 1247  Creatinine, serum  (enoxaparin (LOVENOX)    CrCl >/= 30 ml/min)  Once,   STAT    Comments: Baseline for enoxaparin therapy IF NOT ALREADY DRAWN.    12/11/18 1247          Vitals/Pain Today's Vitals   12/11/18 1030 12/11/18 1130 12/11/18 1134 12/11/18 1200  BP: 103/81 139/64  129/66  Pulse: (!) 32 60  60  Resp: (!) 21     Temp:      TempSrc:      SpO2: 91% 95%  93%  Weight:      Height:      PainSc:   0-No pain     Isolation Precautions No active isolations  Medications Medications  methylPREDNISolone sodium succinate (SOLU-MEDROL) 40 mg/mL injection 40 mg (has no administration in time range)  budesonide (PULMICORT) nebulizer solution 0.5 mg (has no administration in time range)  ipratropium-albuterol (DUONEB) 0.5-2.5 (3) MG/3ML nebulizer solution 3 mL (has no administration in time range)  furosemide (LASIX) injection 40 mg  (has no administration in  time range)  enoxaparin (LOVENOX) injection 40 mg (has no administration in time range)  acetaminophen (TYLENOL) tablet 650 mg (has no administration in time range)    Or  acetaminophen (TYLENOL) suppository 650 mg (has no administration in time range)  ondansetron (ZOFRAN) tablet 4 mg (has no administration in time range)    Or  ondansetron (ZOFRAN) injection 4 mg (has no administration in time range)  allopurinol (ZYLOPRIM) tablet 100 mg (has no administration in time range)  aspirin tablet 81 mg (has no administration in time range)  amLODipine (NORVASC) tablet 5 mg (has no administration in time range)  atorvastatin (LIPITOR) tablet 10 mg (has no administration in time range)  lisinopril (ZESTRIL) tablet 40 mg (has no administration in time range)  metoprolol succinate (TOPROL-XL) 24 hr tablet 50 mg (has no administration in time range)  LORazepam (ATIVAN) tablet 0.5 mg (has no administration in time range)  iohexol (OMNIPAQUE) 350 MG/ML injection 60 mL (60 mLs Intravenous Contrast Given 12/11/18 1056)    Mobility walks Low fall risk   Focused Assessments see previous assessment   R Recommendations: See Admitting Provider Note  Report given to:   Additional Notes:

## 2018-12-11 NOTE — Assessment & Plan Note (Signed)
Unclear etiology.  O2 sats dropped with walking back to room - 86-89%.  Some tightness in his throat/upper chest.  Some throat congestion.  2/6 systolic murmur.   Discussed the need for further testing.  Pt in agreement.  No known COVID exposure.  Discussed further testing and w/up.  Agreed to ER evaluation.  ER notified.

## 2018-12-11 NOTE — Assessment & Plan Note (Signed)
More sob on exam.  SOB walking to room.  Pulse ox 86-89% with exertion - walking to room.  Normal pulse ox 98%.  States has noticed some tightness in his throat/upper chest.  No chest pain.  Some throat congestion.  No acid reflux.  No known fever.  No known covid exposure.  Decreased appetite with weight loss.  Just does not feel as well.  Discussed the sob with decreased O2 sats and current symptoms.  Discussed need for further w/up.  Agrees with need for further evaluation and ER evaluation.  ER notified.

## 2018-12-12 ENCOUNTER — Inpatient Hospital Stay (HOSPITAL_COMMUNITY)
Admit: 2018-12-12 | Discharge: 2018-12-12 | Disposition: A | Discharge status: Home / Self Care | Payer: Medicare HMO | Attending: Internal Medicine | Admitting: Internal Medicine

## 2018-12-12 ENCOUNTER — Observation Stay
Admit: 2018-12-12 | Discharge: 2018-12-12 | Disposition: A | Discharge status: Home / Self Care | Payer: Medicare HMO | Attending: Nurse Practitioner | Admitting: Nurse Practitioner

## 2018-12-12 DIAGNOSIS — J90 Pleural effusion, not elsewhere classified: Secondary | ICD-10-CM | POA: Diagnosis not present

## 2018-12-12 DIAGNOSIS — I340 Nonrheumatic mitral (valve) insufficiency: Secondary | ICD-10-CM | POA: Diagnosis not present

## 2018-12-12 DIAGNOSIS — R0602 Shortness of breath: Secondary | ICD-10-CM

## 2018-12-12 DIAGNOSIS — R7989 Other specified abnormal findings of blood chemistry: Secondary | ICD-10-CM | POA: Diagnosis not present

## 2018-12-12 DIAGNOSIS — I361 Nonrheumatic tricuspid (valve) insufficiency: Secondary | ICD-10-CM

## 2018-12-12 DIAGNOSIS — R918 Other nonspecific abnormal finding of lung field: Secondary | ICD-10-CM | POA: Diagnosis not present

## 2018-12-12 DIAGNOSIS — R7301 Impaired fasting glucose: Secondary | ICD-10-CM | POA: Diagnosis not present

## 2018-12-12 DIAGNOSIS — J9601 Acute respiratory failure with hypoxia: Secondary | ICD-10-CM | POA: Diagnosis present

## 2018-12-12 DIAGNOSIS — R0600 Dyspnea, unspecified: Secondary | ICD-10-CM | POA: Diagnosis not present

## 2018-12-12 LAB — ECHOCARDIOGRAM COMPLETE
Height: 70 in
Weight: 3446.4 oz

## 2018-12-12 LAB — CBC
HCT: 38.6 % — ABNORMAL LOW (ref 39.0–52.0)
Hemoglobin: 13 g/dL (ref 13.0–17.0)
MCH: 33.9 pg (ref 26.0–34.0)
MCHC: 33.7 g/dL (ref 30.0–36.0)
MCV: 100.5 fL — ABNORMAL HIGH (ref 80.0–100.0)
Platelets: 177 10*3/uL (ref 150–400)
RBC: 3.84 MIL/uL — ABNORMAL LOW (ref 4.22–5.81)
RDW: 14.3 % (ref 11.5–15.5)
WBC: 6.6 10*3/uL (ref 4.0–10.5)
nRBC: 0 % (ref 0.0–0.2)

## 2018-12-12 LAB — BASIC METABOLIC PANEL
Anion gap: 14 (ref 5–15)
BUN: 44 mg/dL — ABNORMAL HIGH (ref 8–23)
CO2: 19 mmol/L — ABNORMAL LOW (ref 22–32)
Calcium: 9.3 mg/dL (ref 8.9–10.3)
Chloride: 102 mmol/L (ref 98–111)
Creatinine, Ser: 1.67 mg/dL — ABNORMAL HIGH (ref 0.61–1.24)
GFR calc Af Amer: 42 mL/min — ABNORMAL LOW (ref >60)
GFR calc non Af Amer: 36 mL/min — ABNORMAL LOW (ref >60)
Glucose, Bld: 151 mg/dL — ABNORMAL HIGH (ref 70–99)
Potassium: 4.6 mmol/L (ref 3.5–5.1)
Sodium: 135 mmol/L (ref 135–145)

## 2018-12-12 LAB — GLUCOSE, CAPILLARY
Glucose-Capillary: 123 mg/dL — ABNORMAL HIGH (ref 70–99)
Glucose-Capillary: 149 mg/dL — ABNORMAL HIGH (ref 70–99)
Glucose-Capillary: 135 mg/dL — ABNORMAL HIGH (ref 70–99)
Glucose-Capillary: 142 mg/dL — ABNORMAL HIGH (ref 70–99)

## 2018-12-12 MED ORDER — METOPROLOL SUCCINATE ER 50 MG PO TB24: 50.0000 mg | ORAL_TABLET | Freq: Two times a day (BID) | ORAL | 0 refills | Status: DC

## 2018-12-12 MED ORDER — FUROSEMIDE 10 MG/ML IJ SOLN
40.0000 mg | Freq: Two times a day (BID) | INTRAMUSCULAR | Status: DC
  Administered 2018-12-12: 18:00:00 40 mg via INTRAVENOUS
  Filled 2018-12-12: qty 4

## 2018-12-12 MED ORDER — ENSURE ENLIVE PO LIQD
237.0000 mL | Freq: Two times a day (BID) | ORAL | Status: DC
  Administered 2018-12-12 – 2018-12-13 (×3): 237 mL via ORAL

## 2018-12-12 MED ORDER — ENSURE ENLIVE PO LIQD: 237.0000 mL | Freq: Two times a day (BID) | ORAL | 12 refills | Status: DC

## 2018-12-12 MED ORDER — PERFLUTREN LIPID MICROSPHERE
1.0000 mL | INTRAVENOUS | Status: AC | PRN
  Administered 2018-12-12: 2 mL via INTRAVENOUS
  Filled 2018-12-12: qty 10

## 2018-12-12 MED ORDER — BUDESONIDE 0.5 MG/2ML IN SUSP: 0.5000 mg | Freq: Two times a day (BID) | RESPIRATORY_TRACT | 12 refills | Status: DC

## 2018-12-12 NOTE — Progress Notes (Signed)
[facility] [place] NAME: [name] [name]    MR#:  [id number]  DATE OF BIRTH:  04/26/1931  SUBJECTIVE:   Chief Complaint  Patient presents with  . Shortness of Breath   Patient is seen at the bedside. Overall he feels that his breathing has improved not requiring oxygen supplementation. He still has mild SOB worse with exertion. No other complaints.  REVIEW OF SYSTEMS:  Review of Systems  Constitutional: Negative for chills, fever, malaise/fatigue and weight loss.  HENT: Negative for congestion, hearing loss and sore throat.   Eyes: Negative for blurred vision and double vision.  Respiratory: Positive for cough, shortness of breath and wheezing.   Cardiovascular: Negative for chest pain, palpitations, orthopnea and leg swelling.  Gastrointestinal: Negative for abdominal pain, diarrhea, nausea and vomiting.  Genitourinary: Negative for dysuria and urgency.  Musculoskeletal: Negative for myalgias.  Skin: Negative for rash.  Neurological: Negative for dizziness, sensory change, speech change, focal weakness and headaches.  Psychiatric/Behavioral: Negative for depression.    DRUG ALLERGIES:  No Known Allergies VITALS:  Blood pressure 114/65, pulse 70, temperature 98.1 F (36.7 C), temperature source Oral, resp. rate 16, height 5\' 10"  (1.778 m), weight 97.7 kg, SpO2 94 %. PHYSICAL EXAMINATION:   GENERAL:  85 y.o.-year-old patient lying in the bed with no acute distress.  EYES: Pupils equal, round, reactive to light and accommodation. No scleral icterus. Extraocular muscles intact.  HEENT: Head atraumatic, normocephalic. Oropharynx and nasopharynx clear.  NECK:  Supple, no jugular venous distention. No thyroid enlargement, no tenderness.  LUNGS: Normal breath sounds bilaterally, no wheezing, rales,rhonchi or crepitation. No use of accessory muscles of respiration.  CARDIOVASCULAR: S1, S2 normal. No murmurs, rubs, or gallops.  ABDOMEN:  Soft, nontender, nondistended. Bowel sounds present. No organomegaly or mass.  EXTREMITIES: No pedal edema, cyanosis, or clubbing. No rash or lesions. + pedal pulses MUSCULOSKELETAL: Normal bulk, and power was 5+ grip and elbow, knee, and ankle flexion and extension bilaterally.  NEUROLOGIC:Alert and oriented x 3. CN 2-12 intact. Sensation to light touch and cold stimuli intact bilaterally. Finger to nose nl. Babinski is downgoing. DTR's (biceps, patellar, and achilles) 2+ and symmetric throughout. Gait not tested due to safety concern. PSYCHIATRIC: The patient is alert and oriented x 3.  SKIN: No obvious rash, lesion, or ulcer.   DATA REVIEWED:  LABORATORY PANEL:  Male CBC Recent Labs  Lab 12/12/18 0404  WBC 6.6  HGB 13.0  HCT 38.6*  PLT 177   ------------------------------------------------------------------------------------------------------------------ Chemistries  Recent Labs  Lab 12/11/18 0927  12/12/18 0404  NA 135  --  135  K 4.3  --  4.6  CL 105  --  102  CO2 18*  --  19*  GLUCOSE 143*  --  151*  BUN 38*  --  44*  CREATININE 1.79*   < > 1.67*  CALCIUM 9.9  --  9.3  AST 22  --   --   ALT 12  --   --   ALKPHOS 62  --   --   BILITOT 1.5*  --   --    < > = values in this interval not displayed.   RADIOLOGY:  Ct Angio Chest Pe W And/or Wo Contrast  Result Date: 12/11/2018 CLINICAL DATA:  Shortness of breath EXAM: CT ANGIOGRAPHY CHEST WITH CONTRAST TECHNIQUE: Multidetector CT imaging of the chest was performed using the standard protocol during bolus administration of intravenous contrast. Multiplanar CT image reconstructions and  MIPs were obtained to evaluate the vascular anatomy. CONTRAST:  25mL OMNIPAQUE IOHEXOL 350 MG/ML SOLN COMPARISON:  Chest radiograph December 11, 2018 FINDINGS: Cardiovascular: There is no demonstrable pulmonary embolus. The ascending thoracic aorta measures 4.1 x 4.1 cm in transverse diameter. No dissection is seen. Note that the contrast bolus  within the aorta is not sufficient for confident exclusion of dissection is a differential consideration radiographically. There are foci calcification in the visualized great vessels. There are foci of aortic atherosclerosis. There are foci of coronary artery calcification at multiple sites. There is no pericardial effusion or pericardial thickening evident. The main pulmonary outflow tract measures 3.2 cm, prominent. Mediastinum/Nodes: Visualized thyroid appears unremarkable. There are multiple subcentimeter mediastinal lymph nodes. There are multiple calcified lymph nodes consistent with prior granulomatous disease. There is a prominent right hilar lymph node measuring 1.5 x 1.2 cm. A second right hilar lymph node measures 1.3 x 1.1 cm. There is a subcarinal lymph node measuring 1.3 x 1.2 cm. No esophageal lesions evident. Lungs/Pleura: There are sizable free-flowing pleural effusions bilaterally with compressive atelectasis in each lung base posteriorly. Elsewhere, there is patchy atelectasis in the upper lobes with a somewhat mosaic attenuation bilaterally. Upper Abdomen: In the visualized upper abdomen, there is aortic atherosclerosis. There are occasional calcified splenic granulomas. The gallbladder appears somewhat distended without wall thickening. Musculoskeletal: There is degenerative change in the thoracic and upper lumbar regions. No blastic or lytic bone lesions are evident. There is degenerative change in each shoulder. No chest wall lesions are evident. Review of the MIP images confirms the above findings. IMPRESSION: 1.  No demonstrable pulmonary embolus. 2. Prominence of the ascending thoracic aorta measuring 4.1 x 4.1 cm in transverse diameter. There is aortic atherosclerosis as well as foci of great vessel and coronary artery calcification. No dissection evident; the contrast bolus in the aorta is not sufficient for meaningful assessment for potential dissection. Recommend annual imaging  followup by CTA or MRA. This recommendation follows 2010 ACCF/AHA/AATS/ACR/ASA/SCA/SCAI/SIR/STS/SVM Guidelines for the Diagnosis and Management of Patients with Thoracic Aortic Disease. Circulation. 2010; 121: J941-D408. Aortic aneurysm NOS (ICD10-I71.9). 3. Prominence of the main pulmonary outflow tract, an appearance indicative of a degree of pulmonary arterial hypertension. 4. Sizable free-flowing pleural effusions bilaterally with bibasilar atelectatic change. Mosaic attenuation elsewhere raises question of underlying small airways obstructive disease. 5. Several prominent lymph nodes of uncertain etiology. Evidence of prior granulomatous disease with several calcified mediastinal lymph nodes. There is also calcification in the splenic artery consistent with prior granulomatous disease. Aortic Atherosclerosis (ICD10-I70.0). Electronically Signed   By: Lowella Grip III M.D.   On: 12/11/2018 11:25   Dg Chest Port 1 View  Result Date: 12/11/2018 CLINICAL DATA:  Shortness of breath. EXAM: PORTABLE CHEST 1 VIEW COMPARISON:  None. FINDINGS: The heart size and mediastinal contours are within normal limits. No pneumothorax is noted. Mild bibasilar subsegmental atelectasis is noted with minimal pleural effusions. The visualized skeletal structures are unremarkable. IMPRESSION: Mild bibasilar subsegmental atelectasis is noted with minimal pleural effusions. Aortic Atherosclerosis (ICD10-I70.0). Electronically Signed   By: Marijo Conception M.D.   On: 12/11/2018 09:47   ASSESSMENT AND PLAN:   85 y.o. male with past medical history of hypertension, diabetes mellitus, hyperlipidemia, gout, asthma, BPH, tobacco abuse, and heart murmur presenting to the ED with new onset worsening shortness of breath and dry cough x3 weeks.  1. Acute respiratory failure with hypoxia secondary to acute CHF exacerbation  -CT angiogram chest negative for PE bilaterally with bibasilar  atelectatic change and evidence of prior  granulomatous disease - Supplemental O2, goal sat 88-92% - DuoNebs every 6 hours - Continue methylprednisolone - Pulmonary input appreciated  2. Acute onset Systolic Congestive Heart Failure = presenting with SOB, dry cough and elevated BNP 3,449  Last Echo 12/12/2018 Severe hypokinesis of LV  EF 30-35%, possible LV apical thrombus - Will obtain limited Echo with Definity for further evaluation - Afterload, Goal MAP <70: Lisinopril hold for decreased renal function - Beta-Blockade: Metoprolol - Diuretics:Furosemide 20 IV : Bumetanide 1 IV/PO  IV diureses >1L negative per day until approach euvolemia / worsening renal function. - Low salt diet  - Check daily weight - Strict I&Os - CHF Teaching - Cardiology consult, message sent via haiku to Dr. Clayborn Bigness  3. Elevated troponin-likely demand ischemia -Continue to trend troponin -Echo as above  4. AKI -Hold nephrotoxins -Monitor renal function closely   5. HLD = + Goal LDL<70 - Atorvastatin 10mg  PO qhs   6. HTN- stable + Goal BP <140/90 - Beta-blocker: Metoprolol - ACE-Inhibitor: Will resume Lisinopril as renal functions allows   7.  DM - Diet controlled - Last Hemoglobin A1c 5.8 on 08/09/18 -Titrate up based on ongoing Sliding Scale needs  - Diabetes educator to follow   8. DVT prophylaxis - Lovenox   All the records are reviewed and case discussed with Care Management/Social Worker. Management plans discussed with the patient, family and they are in agreement.  CODE STATUS: Full Code  TOTAL TIME TAKING CARE OF THIS PATIENT: 40 minutes.   More than 50% of the time was spent in counseling/coordination of care: YES  POSSIBLE D/C IN 1 DAYS, DEPENDING ON CLINICAL CONDITION.   on 12/12/2018 at 5:19 PM  This patient was staffed with Dr. Atha Starks, Manuella Ghazi who personally evaluated patient, reviewed documentation and agreed with assessment and plan of care as above.  Rufina Falco, DNP, FNP-BC [facility] Nurse  Practitioner   Between 7am to 6pm - Pager [phone]  After 6pm go to [email or web address] - Proofreader  [facility]  Office  [phone]  CC: Primary care physician; Einar Pheasant, MD  Note: This dictation was prepared with Dragon dictation along with smaller phrase technology. Any transcriptional errors that result from this process are unintentional.

## 2018-12-12 NOTE — Progress Notes (Signed)
Initial Nutrition Assessment  RD working remotely.  DOCUMENTATION CODES:   Not applicable  INTERVENTION:  Recommend liberalizing to regular diet.  Provide Ensure Enlive po BID, each supplement provides 350 kcal and 20 grams of protein.  NUTRITION DIAGNOSIS:   Inadequate oral intake related to decreased appetite as evidenced by per patient/family report.  GOAL:   Patient will meet greater than or equal to 90% of their needs  MONITOR:   PO intake, Supplement acceptance, Labs, Weight trends, I & O's  REASON FOR ASSESSMENT:   Malnutrition Screening Tool    ASSESSMENT:   85 year old male with PMHx of HTN, BPH, gout, asthma, arthritis who is admitted with shortness of breath, CT scan showing small airway disease and lymphadenopathy.   Spoke with patient over the phone. He reports he has a decreased appetite here. He ate only 50% of breakfast this morning. He reports he had a decreased appetite for about 10 days PTA. He typically has a good appetite. He reports typically eating a small lunch and then a larger meal at dinner. He reports eating adequate protein when he has a good appetite but is unable to provide further specifics on intake. He is amenable to drinking ONS to help meet calorie/protein needs.  Patient reports his UBW was 230 lbs. Per chart he was 104.3 kg on 05/22/2018. He is now 97.7 kg (215.4 lbs). He has lost 6.6 kg (6.3% body weight) over some period of time since then (no interval weight history), so unable to determine significance.  Medications reviewed and include: allopurinol, Novolog 0-9 units TID, Novolog 0-5 units QHS, Solu-Medrol 40 mg Q12hrs IV.  Labs reviewed: CBG 102-127, CO2 19, BUN 44, Creatinine 1.67.  NUTRITION - FOCUSED PHYSICAL EXAM:  Unable to complete at this time.  Diet Order:   Diet Order            Diet Heart Room service appropriate? Yes; Fluid consistency: Thin  Diet effective now             EDUCATION NEEDS:   No education  needs have been identified at this time  Skin:  Skin Assessment: Reviewed RN Assessment  Last BM:  12/10/2018 per chart  Height:   Ht Readings from Last 1 Encounters:  12/11/18 5\' 10"  (1.778 m)   Weight:   Wt Readings from Last 1 Encounters:  12/11/18 97.7 kg   Ideal Body Weight:  75.5 kg  BMI:  Body mass index is 30.91 kg/m.  Estimated Nutritional Needs:   Kcal:  2000-2200  Protein:  100-110 grams  Fluid:  1.8 L/day  Willey Blade, MS, RD, LDN Office: [phone] Pager: [phone] After Hours/Weekend Pager: [phone]

## 2018-12-12 NOTE — Progress Notes (Signed)
*  PRELIMINARY RESULTS* Echocardiogram 2D Echocardiogram has been performed.  Sherrie Sport 12/12/2018, 11:27 AM

## 2018-12-12 NOTE — Consult Note (Signed)
Pulmonary Critical Care  Initial Consult Note  LANIS STORLIE JKD:[id number] DOB: 1931/03/22 DOA: 12/11/2018  History of present illness [name] [name] is a 85 year old male patient has a past medical history significant for hypertension hyperlipidemia gout BPH asthma chronic arthritis who presented to the hospital because of increasing shortness of breath.  Apparently the shortness of breath was coming on for about 3 weeks prior to admission.  He also had noted some cough which was dry nonproductive.  Patient also did admit to having some weight loss.  He was admitted to the hospital initial evaluation was done including SARS-CoV-2 virus test which was negative.  He also had a CT scan done of the chest the results of which are below however the notable findings on the CT were bilateral pleural effusions along with some compressive atelectasis.  The patient also did have some scattered hilar lymphadenopathy.  In addition was noted to have prominent pulmonary outflow tract.  I spoke to him and asked him whether he has had any significant occupational exposures to asbestos etc. he denied.  He denied having any service or time in the [employer].  Patient denied having any arthritic changes other than his normal degenerative arthritis.  The patient on the day of evaluation says he feels much better and he was anxious to go home and would prefer to have further work-up done as an outpatient.  He is currently off of oxygen.  He had been seen by his primary care physician and was noted at that time to have saturations of 86 to 89% along with a murmur.  These findings were with exertion.   Review of Systems:  Constitutional:  No weight loss, night sweats, Fevers, chills, fatigue.  HEENT:  No headaches, nasal congestion, post nasal drip,  Cardio-vascular:  No chest pain, Orthopnea, PND, swelling in lower extremities, anasarca, dizziness, palpitations  GI:  No heartburn, indigestion, abdominal pain, nausea,  vomiting, diarrhea  Resp: +shortness of breath with exertion or at rest. +cough, No coughing up of blood.No wheezing Skin:  no rash or lesions.  Musculoskeletal:  No joint pain or swelling.   Remainder ROS performed and is unremarkable other than noted in HPI  Past Medical History:  Diagnosis Date  . Arthritis    right shoulder  . Asthma   . BPH (benign prostatic hypertrophy)   . Gout   . Heart murmur   . History of chicken pox   . Hypercholesterolemia   . Hypertension   . Wears dentures    full upper and lower   Past Surgical History:  Procedure Laterality Date  . arm surgery     right arm fx s/p "plate insertion"  . CATARACT EXTRACTION W/PHACO Right 11/17/2016   Procedure: CATARACT EXTRACTION PHACO AND INTRAOCULAR LENS PLACEMENT ([place])  Right;  Surgeon: Leandrew Koyanagi, MD;  Location: [facility];  Service: Ophthalmology;  Laterality: Right;  . DENTAL SURGERY     all teeth extracted   Social History:  reports that he has never smoked. His smokeless tobacco use includes chew. He reports that he does not drink alcohol or use drugs.  No Known Allergies  Family History  Problem Relation Age of Onset  . Leukemia Father   . Congestive Heart Failure Mother   . Cancer Daughter        Breast Cancer  . Prostate cancer Neg Hx   . Colon cancer Neg Hx     Prior to Admission medications   Medication Sig Start Date  End Date Taking? Authorizing Provider  allopurinol (ZYLOPRIM) 100 MG tablet TAKE 1 TABLET EVERY DAY 04/12/18  Yes Einar Pheasant, MD  amLODipine (NORVASC) 5 MG tablet TAKE 1 TABLET EVERY DAY 04/12/18  Yes Einar Pheasant, MD  aspirin 81 MG tablet Take 81 mg by mouth daily.   Yes [provider]  atorvastatin (LIPITOR) 10 MG tablet TAKE 1 TABLET EVERY DAY 11/14/18  Yes Einar Pheasant, MD  colchicine (COLCRYS) 0.6 MG tablet Take 1 tablet (0.6 mg total) by mouth 2 (two) times daily as needed. 07/20/17  Yes Einar Pheasant, MD  hydrochlorothiazide  (MICROZIDE) 12.5 MG capsule TAKE 1 CAPSULE EVERY DAY 11/14/18  Yes Einar Pheasant, MD  lisinopril (PRINIVIL,ZESTRIL) 40 MG tablet TAKE 1 TABLET EVERY DAY 07/10/18  Yes Einar Pheasant, MD  LORazepam (ATIVAN) 1 MG tablet Takes 1/2 to 1 tablet q day prn 11/17/17  Yes Einar Pheasant, MD  metoprolol succinate (TOPROL-XL) 50 MG 24 hr tablet TAKE 1 TABLET TWO TIMES DAILY WITH OR IMMEDIATELY FOLLOWING A MEAL. 07/10/18  Yes Einar Pheasant, MD   Physical Exam: Vitals:   12/12/18 9450 12/12/18 0839 12/12/18 1033 12/12/18 1226  BP:  123/63    Pulse:  64    Resp:  18    Temp:  98.6 F (37 C)    TempSrc:  Oral    SpO2: 94%  95% 94%  Weight:      Height:        Wt Readings from Last 3 Encounters:  12/11/18 97.7 kg  12/11/18 98.2 kg  05/22/18 104.3 kg    General:  Appears calm and comfortable Eyes: PERRL, normal lids, irises & conjunctiva ENT: grossly normal hearing, lips & tongue Neck: no LAD, masses or thyromegaly Cardiovascular: RRR, no m/r/g. No LE edema. Respiratory: CTA bilaterally, no w/r/r.       Normal respiratory effort. Abdomen: soft, nontender Skin: no rash or induration seen on limited exam Musculoskeletal: grossly normal tone BUE/BLE Psychiatric: grossly normal mood and affect Neurologic: grossly non-focal.          Labs on Admission:  Basic Metabolic Panel: Recent Labs  Lab 12/11/18 0927 12/11/18 1426 12/12/18 0404  NA 135  --  135  K 4.3  --  4.6  CL 105  --  102  CO2 18*  --  19*  GLUCOSE 143*  --  151*  BUN 38*  --  44*  CREATININE 1.79* 1.52* 1.67*  CALCIUM 9.9  --  9.3   Liver Function Tests: Recent Labs  Lab 12/11/18 0927  AST 22  ALT 12  ALKPHOS 62  BILITOT 1.5*  PROT 7.7  ALBUMIN 4.6   No results for input(s): LIPASE, AMYLASE in the last 168 hours. No results for input(s): AMMONIA in the last 168 hours. CBC: Recent Labs  Lab 12/11/18 0927 12/12/18 0404  WBC 10.0 6.6  NEUTROABS 6.4  --   HGB 14.3 13.0  HCT 42.0 38.6*  MCV 99.5 100.5*   PLT 204 177   Cardiac Enzymes: Recent Labs  Lab 12/11/18 0927  TROPONINI 0.04*    BNP (last 3 results) Recent Labs    12/11/18 0927  BNP 3,449.0*    ProBNP (last 3 results) No results for input(s): PROBNP in the last 8760 hours.  CBG: Recent Labs  Lab 12/11/18 1704 12/11/18 2114 12/12/18 0747 12/12/18 1144  GLUCAP 102* 127* 123* 149*    Radiological Exams on Admission: Ct Angio Chest Pe W And/or Wo Contrast  Result Date: 12/11/2018 CLINICAL DATA:  Shortness of breath EXAM: CT ANGIOGRAPHY CHEST WITH CONTRAST TECHNIQUE: Multidetector CT imaging of the chest was performed using the standard protocol during bolus administration of intravenous contrast. Multiplanar CT image reconstructions and MIPs were obtained to evaluate the vascular anatomy. CONTRAST:  4mL OMNIPAQUE IOHEXOL 350 MG/ML SOLN COMPARISON:  Chest radiograph December 11, 2018 FINDINGS: Cardiovascular: There is no demonstrable pulmonary embolus. The ascending thoracic aorta measures 4.1 x 4.1 cm in transverse diameter. No dissection is seen. Note that the contrast bolus within the aorta is not sufficient for confident exclusion of dissection is a differential consideration radiographically. There are foci calcification in the visualized great vessels. There are foci of aortic atherosclerosis. There are foci of coronary artery calcification at multiple sites. There is no pericardial effusion or pericardial thickening evident. The main pulmonary outflow tract measures 3.2 cm, prominent. Mediastinum/Nodes: Visualized thyroid appears unremarkable. There are multiple subcentimeter mediastinal lymph nodes. There are multiple calcified lymph nodes consistent with prior granulomatous disease. There is a prominent right hilar lymph node measuring 1.5 x 1.2 cm. A second right hilar lymph node measures 1.3 x 1.1 cm. There is a subcarinal lymph node measuring 1.3 x 1.2 cm. No esophageal lesions evident. Lungs/Pleura: There are sizable  free-flowing pleural effusions bilaterally with compressive atelectasis in each lung base posteriorly. Elsewhere, there is patchy atelectasis in the upper lobes with a somewhat mosaic attenuation bilaterally. Upper Abdomen: In the visualized upper abdomen, there is aortic atherosclerosis. There are occasional calcified splenic granulomas. The gallbladder appears somewhat distended without wall thickening. Musculoskeletal: There is degenerative change in the thoracic and upper lumbar regions. No blastic or lytic bone lesions are evident. There is degenerative change in each shoulder. No chest wall lesions are evident. Review of the MIP images confirms the above findings. IMPRESSION: 1.  No demonstrable pulmonary embolus. 2. Prominence of the ascending thoracic aorta measuring 4.1 x 4.1 cm in transverse diameter. There is aortic atherosclerosis as well as foci of great vessel and coronary artery calcification. No dissection evident; the contrast bolus in the aorta is not sufficient for meaningful assessment for potential dissection. Recommend annual imaging followup by CTA or MRA. This recommendation follows 2010 ACCF/AHA/AATS/ACR/ASA/SCA/SCAI/SIR/STS/SVM Guidelines for the Diagnosis and Management of Patients with Thoracic Aortic Disease. Circulation. 2010; 121: G818-H631. Aortic aneurysm NOS (ICD10-I71.9). 3. Prominence of the main pulmonary outflow tract, an appearance indicative of a degree of pulmonary arterial hypertension. 4. Sizable free-flowing pleural effusions bilaterally with bibasilar atelectatic change. Mosaic attenuation elsewhere raises question of underlying small airways obstructive disease. 5. Several prominent lymph nodes of uncertain etiology. Evidence of prior granulomatous disease with several calcified mediastinal lymph nodes. There is also calcification in the splenic artery consistent with prior granulomatous disease. Aortic Atherosclerosis (ICD10-I70.0). Electronically Signed   By: Lowella Grip III M.D.   On: 12/11/2018 11:25   Dg Chest Port 1 View  Result Date: 12/11/2018 CLINICAL DATA:  Shortness of breath. EXAM: PORTABLE CHEST 1 VIEW COMPARISON:  None. FINDINGS: The heart size and mediastinal contours are within normal limits. No pneumothorax is noted. Mild bibasilar subsegmental atelectasis is noted with minimal pleural effusions. The visualized skeletal structures are unremarkable. IMPRESSION: Mild bibasilar subsegmental atelectasis is noted with minimal pleural effusions. Aortic Atherosclerosis (ICD10-I70.0). Electronically Signed   By: Marijo Conception M.D.   On: 12/11/2018 09:47    EKG: Independently reviewed.  Assessment/Plan Active Problems:   Shortness of breath   1. Bilateral pleural effusions he has significant free-flowing bilateral effusions which are causing underlying atelectasis  which were probably responsible for his presentation of shortness of breath.  I think he needs to have a thoracentesis for diagnostic and therapeutic purposes.  The patient however would prefer to go home if this is the case he should come back in the office and we will try to see about scheduling him for thoracentesis.  One issue that may come up is getting a thoracentesis scheduled as an outpatient secondary to the current pandemic situation.  While the ideal situation would be to do the thoracentesis for therapeutic purposes now the patient states that he has a elderly wife that needs him at home and he would prefer to go home and follow-up as an outpatient.  He understands the risks that are involved in the situation and was instructed that if there is any worsening of his condition he immediately return to the hospital. 2. Shortness of breath secondary to above he does have a history of chewing tobacco but states that he never really smoked.  In addition he does not have any significant history of occupational exposure.  Still it should be noted that he should have a pulmonary function  evaluation done.  An echocardiogram has been done and the results are still pending.  Code Status: Full code  Family Communication: No family present in the room Disposition Plan: Home with follow-up in the office  Time spent: 70 minutes  I have personally obtained a history, examined the patient, evaluated laboratory and imaging results, formulated the assessment and plan and placed orders.  The Patient requires high complexity decision making for assessment and support. Total Time Spent 80min   Dana Debo A Brixon Zhen, MD [facility] Pulmonary Critical Care Medicine Sleep Medicine

## 2018-12-12 NOTE — Care Management CC44 (Signed)
Condition Code 44 Documentation Completed  Patient Details  Name: [name] [name] MRN: [id number] Date of Birth: [birth year]   Condition Code 44 given:  Yes Patient signature on Condition Code 44 notice:  Yes Documentation of 2 MD's agreement:  Yes Code 44 added to claim:  Yes    Shelbie Hutching, RN 12/12/2018, 4:04 PM

## 2018-12-12 NOTE — Care Management Obs Status (Signed)
[facility] NOTIFICATION   Patient Details  Name: [name] [name] MRN: [id number] Date of Birth: April 12, 1931   Medicare Observation Status Notification Given:  Yes    Shelbie Hutching, RN 12/12/2018, 4:04 PM

## 2018-12-13 DIAGNOSIS — J9601 Acute respiratory failure with hypoxia: Secondary | ICD-10-CM | POA: Diagnosis not present

## 2018-12-13 DIAGNOSIS — I5041 Acute combined systolic (congestive) and diastolic (congestive) heart failure: Secondary | ICD-10-CM

## 2018-12-13 DIAGNOSIS — R918 Other nonspecific abnormal finding of lung field: Secondary | ICD-10-CM | POA: Diagnosis not present

## 2018-12-13 DIAGNOSIS — I272 Pulmonary hypertension, unspecified: Secondary | ICD-10-CM | POA: Diagnosis not present

## 2018-12-13 DIAGNOSIS — R7301 Impaired fasting glucose: Secondary | ICD-10-CM | POA: Diagnosis not present

## 2018-12-13 DIAGNOSIS — Z01810 Encounter for preprocedural cardiovascular examination: Secondary | ICD-10-CM | POA: Diagnosis not present

## 2018-12-13 DIAGNOSIS — R7989 Other specified abnormal findings of blood chemistry: Secondary | ICD-10-CM | POA: Diagnosis not present

## 2018-12-13 DIAGNOSIS — I350 Nonrheumatic aortic (valve) stenosis: Secondary | ICD-10-CM

## 2018-12-13 DIAGNOSIS — R0602 Shortness of breath: Secondary | ICD-10-CM | POA: Diagnosis not present

## 2018-12-13 LAB — CBC WITH DIFFERENTIAL/PLATELET
Abs Immature Granulocytes: 0.07 10*3/uL (ref 0.00–0.07)
Basophils Absolute: 0 10*3/uL (ref 0.0–0.1)
Basophils Relative: 0 %
Eosinophils Absolute: 0 10*3/uL (ref 0.0–0.5)
Eosinophils Relative: 0 %
HCT: 35.6 % — ABNORMAL LOW (ref 39.0–52.0)
Hemoglobin: 12.1 g/dL — ABNORMAL LOW (ref 13.0–17.0)
Immature Granulocytes: 1 %
Lymphocytes Relative: 16 %
Lymphs Abs: 2 10*3/uL (ref 0.7–4.0)
MCH: 33.3 pg (ref 26.0–34.0)
MCHC: 34 g/dL (ref 30.0–36.0)
MCV: 98.1 fL (ref 80.0–100.0)
Monocytes Absolute: 0.5 10*3/uL (ref 0.1–1.0)
Monocytes Relative: 4 %
Neutro Abs: 9.8 10*3/uL — ABNORMAL HIGH (ref 1.7–7.7)
Neutrophils Relative %: 79 %
Platelets: 174 10*3/uL (ref 150–400)
RBC: 3.63 MIL/uL — ABNORMAL LOW (ref 4.22–5.81)
RDW: 14.1 % (ref 11.5–15.5)
WBC: 12.3 10*3/uL — ABNORMAL HIGH (ref 4.0–10.5)
nRBC: 0 % (ref 0.0–0.2)

## 2018-12-13 LAB — GLUCOSE, CAPILLARY
Glucose-Capillary: 141 mg/dL — ABNORMAL HIGH (ref 70–99)
Glucose-Capillary: 242 mg/dL — ABNORMAL HIGH (ref 70–99)

## 2018-12-13 LAB — BASIC METABOLIC PANEL
Anion gap: 12 (ref 5–15)
BUN: 52 mg/dL — ABNORMAL HIGH (ref 8–23)
CO2: 18 mmol/L — ABNORMAL LOW (ref 22–32)
Calcium: 9.6 mg/dL (ref 8.9–10.3)
Chloride: 104 mmol/L (ref 98–111)
Creatinine, Ser: 1.64 mg/dL — ABNORMAL HIGH (ref 0.61–1.24)
GFR calc Af Amer: 43 mL/min — ABNORMAL LOW (ref >60)
GFR calc non Af Amer: 37 mL/min — ABNORMAL LOW (ref >60)
Glucose, Bld: 153 mg/dL — ABNORMAL HIGH (ref 70–99)
Potassium: 4.3 mmol/L (ref 3.5–5.1)
Sodium: 134 mmol/L — ABNORMAL LOW (ref 135–145)

## 2018-12-13 LAB — BRAIN NATRIURETIC PEPTIDE: B Natriuretic Peptide: 3136 pg/mL — ABNORMAL HIGH (ref 0.0–100.0)

## 2018-12-13 LAB — MAGNESIUM: Magnesium: 2.2 mg/dL (ref 1.7–2.4)

## 2018-12-13 MED ORDER — TRAZODONE HCL 50 MG PO TABS
50.0000 mg | ORAL_TABLET | Freq: Once | ORAL | Status: AC
  Administered 2018-12-13: 50 mg via ORAL
  Filled 2018-12-13: qty 1

## 2018-12-13 MED ORDER — BUDESONIDE 90 MCG/ACT IN AEPB: 2.0000 | INHALATION_SPRAY | Freq: Two times a day (BID) | RESPIRATORY_TRACT | 0 refills | Status: DC

## 2018-12-13 MED ORDER — PREDNISONE 10 MG PO TABS: ORAL_TABLET | ORAL | 0 refills | Status: DC

## 2018-12-13 MED ORDER — TORSEMIDE 20 MG PO TABS
20.0000 mg | ORAL_TABLET | Freq: Every day | ORAL | Status: DC
  Administered 2018-12-13: 11:00:00 20 mg via ORAL
  Filled 2018-12-13: qty 1

## 2018-12-13 MED ORDER — TORSEMIDE 20 MG PO TABS: 20.0000 mg | ORAL_TABLET | Freq: Every day | ORAL | 0 refills | Status: DC

## 2018-12-13 NOTE — Discharge Summary (Addendum)
[facility] [place] NAME: [name] [name]    MR#:  [id number]  DATE OF BIRTH:  [birth year]  DATE OF ADMISSION:  12/11/2018   ADMITTING PHYSICIAN: Loletha Grayer, MD  DATE OF DISCHARGE: 12/13/18  PRIMARY CARE PHYSICIAN: Einar Pheasant, MD   ADMISSION DIAGNOSIS:   SOB (shortness of breath) [R06.02]  DISCHARGE DIAGNOSIS:   Active Problems:   Shortness of breath   Acute respiratory failure with hypoxia ([place])   SECONDARY DIAGNOSIS:   Past Medical History:  Diagnosis Date  . Arthritis    right shoulder  . Asthma   . BPH (benign prostatic hypertrophy)   . Gout   . Heart murmur   . History of chicken pox   . Hypercholesterolemia   . Hypertension   . Wears dentures    full upper and lower    HOSPITAL COURSE:   85 y.o. male with past medical history of hypertension, diabetes mellitus, hyperlipidemia, gout, asthma, BPH, tobacco abuse, and heart murmur presenting to the ED with new onset worsening shortness of breath and dry cough x3 weeks.  1. Acute respiratory failure with hypoxia secondary to acute CHF exacerbation  -CT angiogram chest negative for PE bilaterally with bibasilar atelectatic change and evidence of prior granulomatous disease - Improved - treated with methylprednisolone, will give prednisone taper at discharge - Pulmonary follow up on outpatient basis  2. Acute onset Systolic Congestive Heart Failure = presenting with SOB, dry cough and elevated BNP 3,449  Last Echo 12/12/2018 Severe hypokinesis of LV  EF 30-35%, possible LV apical thrombus - limited Echo with Definity for further evaluation pending - Afterload, Goal MAP <70: Lisinopril held for decreased renal function - Beta-Blockade: Metoprolol - Diuretics: start Torsemide 20 mg daily at discharge monitor  renal function. - Low salt diet - Check daily weight - CHF Teaching - He was evaluated by cardiology during this hospitalization, ideally we would  have wished to continue monitoring however he does have strong desire to go home today. He does however agree to follow up with cardiology in I week, his PCP and Pulmonary specialist - Will follow up with cardiology in 1 week  3. Elevated troponin-likely demand ischemia - Echo as above  4. AKI -creatine  Improving - Hold Nephrotoxins - Monitor renal function closely - Follow up with PCP for monitoring  5.HLD = + Goal LDL<70 - Atorvastatin 10mg  PO qhs  6.HTN- stable + Goal BP <140/90 - Beta-blocker: Metoprolol - Will discontinue Amlodipine in the setting of cardiomyopathy - ACE-Inhibitor: Will hold Lisinopril at discharge due to renal function  7.DM -Diet controlled - Last Hemoglobin A1c 5.8 on 08/09/18  DISCHARGE CONDITIONS:  stable CONSULTS OBTAINED:   Treatment Team:  Allyne Gee, MD Yolonda Kida, MD End, Harrell Gave, MD  DRUG ALLERGIES:   No Known Allergies DISCHARGE MEDICATIONS:   Allergies as of 12/13/2018   No Known Allergies     Medication List    STOP taking these medications   amLODipine 5 MG tablet Commonly known as: NORVASC   lisinopril 40 MG tablet Commonly known as: ZESTRIL     TAKE these medications   allopurinol 100 MG tablet Commonly known as: ZYLOPRIM TAKE 1 TABLET EVERY DAY   aspirin 81 MG tablet Take 81 mg by mouth daily.   atorvastatin 10 MG tablet Commonly known as: LIPITOR TAKE 1 TABLET EVERY DAY   Budesonide 90 MCG/ACT inhaler Inhale 2 puffs into the lungs 2 (two) times daily.  colchicine 0.6 MG tablet Commonly known as: Colcrys Take 1 tablet (0.6 mg total) by mouth 2 (two) times daily as needed.   feeding supplement (ENSURE ENLIVE) Liqd Take 237 mLs by mouth 2 (two) times daily between meals.   hydrochlorothiazide 12.5 MG capsule Commonly known as: MICROZIDE TAKE 1 CAPSULE EVERY DAY   LORazepam 1 MG tablet Commonly known as: ATIVAN Takes 1/2 to 1 tablet q day prn   metoprolol succinate 50 MG 24  hr tablet Commonly known as: TOPROL-XL Take 1 tablet (50 mg total) by mouth 2 (two) times a day. Take with or immediately following a meal. What changed: See the new instructions.   predniSONE 10 MG tablet Commonly known as: DELTASONE Take 4 tablets (40 mg total) by mouth daily with breakfast for 1 day, THEN 3 tablets (30 mg total) daily with breakfast for 1 day, THEN 2 tablets (20 mg total) daily with breakfast for 1 day, THEN 1 tablet (10 mg total) daily with breakfast for 1 day. Start taking on: December 13, 2018   torsemide 20 MG tablet Commonly known as: DEMADEX Take 1 tablet (20 mg total) by mouth daily. Start taking on: December 14, 2018      DISCHARGE INSTRUCTIONS:    DIET:   Low sodium diet  ACTIVITY:   Activity as tolerated  OXYGEN:   Home Oxygen: No.  Oxygen Delivery: room air  DISCHARGE LOCATION:   home   If you experience worsening of your admission symptoms, develop shortness of breath, life threatening emergency, suicidal or homicidal thoughts you must seek medical attention immediately by calling 911 or calling your MD immediately  if symptoms less severe.  You Must read complete instructions/literature along with all the possible adverse reactions/side effects for all the Medicines you take and that have been prescribed to you. Take any new Medicines after you have completely understood and accpet all the possible adverse reactions/side effects.   Please note  You were cared for by a hospitalist during your hospital stay. If you have any questions about your discharge medications or the care you received while you were in the hospital after you are discharged, you can call the unit and asked to speak with the hospitalist on call if the hospitalist that took care of you is not available. Once you are discharged, your primary care physician will handle any further medical issues. Please note that NO REFILLS for any discharge medications will be authorized once you  are discharged, as it is imperative that you return to your primary care physician (or establish a relationship with a primary care physician if you do not have one) for your aftercare needs so that they can reassess your need for medications and monitor your lab values.  On the day of Discharge:  VITAL SIGNS:   Blood pressure 118/79, pulse 70, temperature 97.7 F (36.5 C), temperature source Oral, resp. rate 17, height 5\' 10"  (1.778 m), weight 97.7 kg, SpO2 93 %.  PHYSICAL EXAMINATION:   GENERAL:  85 y.o.-year-old patient lying in the bed with no acute distress.  EYES: Pupils equal, round, reactive to light and accommodation. No scleral icterus. Extraocular muscles intact.  HEENT: Head atraumatic, normocephalic. Oropharynx and nasopharynx clear.  NECK:  Supple, no jugular venous distention. No thyroid enlargement, no tenderness.  LUNGS: Normal breath sounds bilaterally, no wheezing, rales,rhonchi or crepitation. No use of accessory muscles of respiration.  CARDIOVASCULAR: S1, S2 normal. No murmurs, rubs, or gallops.  ABDOMEN: Soft, non-tender, non-distended. Bowel sounds present.  No organomegaly or mass.  EXTREMITIES: No pedal edema, cyanosis, or clubbing.  NEUROLOGIC: Cranial nerves II through XII are intact. Muscle strength 5/5 in all extremities. Sensation intact. Gait not checked.  PSYCHIATRIC: The patient is alert and oriented x 3.  SKIN: No obvious rash, lesion, or ulcer.   DATA REVIEW:   CBC Recent Labs  Lab 12/13/18 0502  WBC 12.3*  HGB 12.1*  HCT 35.6*  PLT 174    Chemistries  Recent Labs  Lab 12/11/18 0927  12/13/18 0502  NA 135   < > 134*  K 4.3   < > 4.3  CL 105   < > 104  CO2 18*   < > 18*  GLUCOSE 143*   < > 153*  BUN 38*   < > 52*  CREATININE 1.79*   < > 1.64*  CALCIUM 9.9   < > 9.6  MG  --   --  2.2  AST 22  --   --   ALT 12  --   --   ALKPHOS 62  --   --   BILITOT 1.5*  --   --    < > = values in this interval not displayed.     Microbiology  Results  Results for orders placed or performed during the hospital encounter of 12/11/18  SARS Coronavirus 2 (CEPHEID- Performed in [place] hospital lab), Hosp Order     Status: None   Collection Time: 12/11/18  9:27 AM   Specimen: Nasopharyngeal Swab  Result Value Ref Range Status   SARS Coronavirus 2 NEGATIVE NEGATIVE Final    Comment: (NOTE) If result is NEGATIVE SARS-CoV-2 target nucleic acids are NOT DETECTED. The SARS-CoV-2 RNA is generally detectable in upper and lower  respiratory specimens during the acute phase of infection. The lowest  concentration of SARS-CoV-2 viral copies this assay can detect is 250  copies / mL. A negative result does not preclude SARS-CoV-2 infection  and should not be used as the sole basis for treatment or other  patient management decisions.  A negative result may occur with  improper specimen collection / handling, submission of specimen other  than nasopharyngeal swab, presence of viral mutation(s) within the  areas targeted by this assay, and inadequate number of viral copies  (<250 copies / mL). A negative result must be combined with clinical  observations, patient history, and epidemiological information. If result is POSITIVE SARS-CoV-2 target nucleic acids are DETECTED. The SARS-CoV-2 RNA is generally detectable in upper and lower  respiratory specimens dur ing the acute phase of infection.  Positive  results are indicative of active infection with SARS-CoV-2.  Clinical  correlation with patient history and other diagnostic information is  necessary to determine patient infection status.  Positive results do  not rule out bacterial infection or co-infection with other viruses. If result is PRESUMPTIVE POSTIVE SARS-CoV-2 nucleic acids MAY BE PRESENT.   A presumptive positive result was obtained on the submitted specimen  and confirmed on repeat testing.  While 2019 novel coronavirus  (SARS-CoV-2) nucleic acids may be present in the  submitted sample  additional confirmatory testing may be necessary for epidemiological  and / or clinical management purposes  to differentiate between  SARS-CoV-2 and other Sarbecovirus currently known to infect humans.  If clinically indicated additional testing with an alternate test  methodology [phone]) is advised. The SARS-CoV-2 RNA is generally  detectable in upper and lower respiratory sp ecimens during the acute  phase of infection.  The expected result is Negative. Fact Sheet for Patients:  StrictlyIdeas.no Fact Sheet for Healthcare Providers: BankingDealers.co.za This test is not yet approved or cleared by the [place] FDA and has been authorized for detection and/or diagnosis of SARS-CoV-2 by FDA under an Emergency Use Authorization (EUA).  This EUA will remain in effect (meaning this test can be used) for the duration of the COVID-19 declaration under Section 564(b)(1) of the Act, 21 U.S.C. section 360bbb-3(b)(1), unless the authorization is terminated or revoked sooner. Performed at [facility], [address] [place], [place] [address]     RADIOLOGY:  No results found.   Management plans discussed with the patient, family and they are in agreement.  CODE STATUS:     Code Status Orders  (From admission, onward)         Start     Ordered   12/11/18 1247  Full code  Continuous     12/11/18 1247        Code Status History    This patient has a current code status but no historical code status.   Advance Care Planning Activity      TOTAL TIME TAKING CARE OF THIS PATIENT: 35 minutes.   Rufina Falco, DNP, FNP-BC [facility] Nurse Practitioner Between 7am to 6pm - Pager - [phone]  After 6pm go to [email or web address] - password [email or web address] [facility]  Office  [phone]   12/13/2018 at 5:50 PM  CC: Primary care physician; Einar Pheasant, MD  Note: This  dictation was prepared with Dragon dictation along with smaller phrase technology. Any transcriptional errors that result from this process are unintentional.

## 2018-12-13 NOTE — Consult Note (Signed)
Cardiology Consultation:   Patient ID: [name] [name]; [id number]; [birth year]   Admit date: 12/11/2018 Date of Consult: 12/13/2018  Primary Care Provider: Einar Pheasant, MD Primary Cardiologist: New to [facility] - consult by End   Patient Profile:   [name] [name] is a 85 y.o. male with a hx of HTN, HLD, asthma, arthritis, gout, and BPH who is being seen today for the evaluation of acute systolic CHF at the request of Ms. Stark Klein, NP.  History of Present Illness:   [name] [name] was previously evaluated at [facility] cardiology in 2015 for evaluation of murmur.  Patient indicates he was told he had a leaky heart valve at that time and underwent nuclear stress test which was unrevealing per his report.  We do not have records for review.  Patient initially saw his PCP on 6/22 for a scheduled physical though reported he did not feel well.  It was noted he was hypoxic with ambulation back to the exam room with a documented O2 sat of 86 to 89% on room air with some tightness in his throat and upper chest.  In this setting he was sent to the hospital.  He was admitted to the hospital in 6/22 with increased exertional shortness of breath for at least 3 weeks prior with associated cough that was nonproductive and wheezing.  He denied any complaints of chest pain or weight gain, actually noting some weight loss.  BP/heart rate have been stable.  He initially required supplemental oxygen via nasal cannula though has subsequently been weaned to room air.  Documented admission weight of 97.7 kg which is down from his most recent in person PCP visit from 05/2018 which had a documented weight of 104.3 kg.  EKG very difficult to interpret as below secondary to diffuse baseline wandering, CXR showed mild bibasilar subsegmental atelectasis with pleural effusions.  CTA chest showed no evidence of PE with prominence of the ascending aorta measuring 4.1 x 4.1 cm with aortic atherosclerosis as well as coronary artery  calcification, evidence of pulmonary arterial hypertension, sizable free-flowing pleural effusions bilaterally with bibasilar atelectatic change, as well as several prominent lymph nodes of uncertain etiology.  Labs showed an initial BNP of 3449 trending to 3136 as of this morning, troponin 0 0.04 not cycled, COVID-19 negative, WBC 6.6 trending to 12.3, Hgb 13 trending to 12.1, PLT 177 trending to 174, BUN 38 trending to 52, serum creatinine 1.52 trending to 1.64, potassium 4.3, magnesium 2.2, AST/ALT normal, T bili 1.5, albumin 4.6.  He has been evaluated by pulmonology with recommendation for thoracentesis but the patient prefers to have this done as outpatient secondary to his wife's health at home.  Echo on 4/23 showed severe hypokinesis of the entire anterior wall, anterior lateral wall, and apical segment, dyskinesis of the basal inferior wall, EF 30 to 35%, Doppler parameters consistent with restrictive filling with elevated mean left atrial pressure, unable to exclude LV apical thrombus, large pleural effusion in the left lateral region, degenerative mitral valve with moderate mitral regurgitation, indeterminate number of aortic valve cusps with severe aortic valve stenosis with a mean gradient of 42 mmHg and a valve area of 0.58 cm, moderate aortic valve insufficiency, low normal RV systolic function, RV systolic pressure at least 65 to 70 mmHg.  Repeat limited echo with Definity is currently pending.  Patient has received 40 of IV Lasix x2 on 6/22 and 6/23 with transition to p.o. torsemide 20 mg daily on 6/24 with documented urine output  of 325 mL and a net positive fluid of 225 mL for the admission.  No documented weight trend.  Currently, reports his shortness of breath is significantly improved when compared to his admission.  He denies any chest pain, dizziness, presyncope, or syncope.  He has been weaned to room air.  He states he has stable, chronic mild edema involving the right lower extremity  stemming from a prior football injury.  He denies any abdominal distention or orthopnea.  He does state he has lost approximately 15 to 16 pounds over the prior 4 weeks in the setting of decreased appetite.  He feels like his appetite is starting to pick back up a little.  Past Medical History:  Diagnosis Date   Arthritis    right shoulder   Asthma    BPH (benign prostatic hypertrophy)    Gout    Heart murmur    History of chicken pox    Hypercholesterolemia    Hypertension    Wears dentures    full upper and lower    Past Surgical History:  Procedure Laterality Date   arm surgery     right arm fx s/p "plate insertion"   CATARACT EXTRACTION W/PHACO Right 11/17/2016   Procedure: CATARACT EXTRACTION PHACO AND INTRAOCULAR LENS PLACEMENT ([place])  Right;  Surgeon: Leandrew Koyanagi, MD;  Location: [place];  Service: Ophthalmology;  Laterality: Right;   DENTAL SURGERY     all teeth extracted     Home Meds: Prior to Admission medications   Medication Sig Start Date End Date Taking? Authorizing Provider  allopurinol (ZYLOPRIM) 100 MG tablet TAKE 1 TABLET EVERY DAY 04/12/18  Yes Einar Pheasant, MD  amLODipine (NORVASC) 5 MG tablet TAKE 1 TABLET EVERY DAY 04/12/18  Yes Einar Pheasant, MD  aspirin 81 MG tablet Take 81 mg by mouth daily.   Yes [provider]  atorvastatin (LIPITOR) 10 MG tablet TAKE 1 TABLET EVERY DAY 11/14/18  Yes Einar Pheasant, MD  colchicine (COLCRYS) 0.6 MG tablet Take 1 tablet (0.6 mg total) by mouth 2 (two) times daily as needed. 07/20/17  Yes Einar Pheasant, MD  hydrochlorothiazide (MICROZIDE) 12.5 MG capsule TAKE 1 CAPSULE EVERY DAY 11/14/18  Yes Einar Pheasant, MD  lisinopril (PRINIVIL,ZESTRIL) 40 MG tablet TAKE 1 TABLET EVERY DAY 07/10/18  Yes Einar Pheasant, MD  LORazepam (ATIVAN) 1 MG tablet Takes 1/2 to 1 tablet q day prn 11/17/17  Yes Einar Pheasant, MD  metoprolol succinate (TOPROL-XL) 50 MG 24 hr tablet TAKE 1 TABLET TWO  TIMES DAILY WITH OR IMMEDIATELY FOLLOWING A MEAL. 07/10/18  Yes Einar Pheasant, MD  budesonide (PULMICORT) 0.5 MG/2ML nebulizer solution Take 2 mLs (0.5 mg total) by nebulization 2 (two) times daily. 12/12/18   Lang Snow, NP  feeding supplement, ENSURE ENLIVE, (ENSURE ENLIVE) LIQD Take 237 mLs by mouth 2 (two) times daily between meals. 12/13/18   Lang Snow, NP  metoprolol succinate (TOPROL-XL) 50 MG 24 hr tablet Take 1 tablet (50 mg total) by mouth 2 (two) times a day. Take with or immediately following a meal. 12/12/18   Lang Snow, NP    Inpatient Medications: Scheduled Meds:  allopurinol  100 mg Oral Daily   amLODipine  5 mg Oral Daily   aspirin EC  81 mg Oral Daily   atorvastatin  10 mg Oral Daily   budesonide (PULMICORT) nebulizer solution  0.5 mg Nebulization BID   enoxaparin (LOVENOX) injection  40 mg Subcutaneous Q24H   feeding supplement (ENSURE  ENLIVE)  237 mL Oral BID BM   insulin aspart  0-5 Units Subcutaneous QHS   insulin aspart  0-9 Units Subcutaneous TID WC   ipratropium-albuterol  3 mL Nebulization Q6H   methylPREDNISolone (SOLU-MEDROL) injection  40 mg Intravenous Q12H   metoprolol succinate  50 mg Oral BID   torsemide  20 mg Oral Daily   Continuous Infusions:  PRN Meds: acetaminophen **OR** acetaminophen, LORazepam, ondansetron **OR** ondansetron (ZOFRAN) IV  Allergies:  No Known Allergies  Social History:   Social History   Socioeconomic History   Marital status: Married    Spouse name: Not on file   Number of children: Not on file   Years of education: Not on file   Highest education level: Not on file  Occupational History   Not on file  Social Needs   Financial resource strain: Not hard at all   Food insecurity    Worry: Never true    Inability: Never true   Transportation needs    Medical: No    Non-medical: No  Tobacco Use   Smoking status: Never Smoker   Smokeless tobacco: Current  User    Types: Chew  Substance and Sexual Activity   Alcohol use: No    Alcohol/week: 0.0 standard drinks   Drug use: No   Sexual activity: Not Currently  Lifestyle   Physical activity    Days per week: 0 days    Minutes per session: Not on file   Stress: Not at all  Relationships   Social connections    Talks on phone: Not on file    Gets together: Not on file    Attends religious service: Not on file    Active member of club or organization: Not on file    Attends meetings of clubs or organizations: Not on file    Relationship status: Not on file   Intimate partner violence    Fear of current or ex partner: No    Emotionally abused: No    Physically abused: No    Forced sexual activity: No  Other Topics Concern   Not on file  Social History Narrative   Not on file     Family History:   Family History  Problem Relation Age of Onset   Leukemia Father    Congestive Heart Failure Mother    Cancer Daughter        Breast Cancer   Prostate cancer Neg Hx    Colon cancer Neg Hx     ROS:  Review of Systems  Constitutional: Positive for malaise/fatigue. Negative for chills, diaphoresis, fever and weight loss.  HENT: Negative for congestion.   Eyes: Negative for discharge and redness.  Respiratory: Negative for cough, hemoptysis, sputum production, shortness of breath and wheezing.   Cardiovascular: Negative for chest pain, palpitations, orthopnea, claudication, leg swelling and PND.  Gastrointestinal: Negative for abdominal pain, blood in stool, heartburn, melena, nausea and vomiting.  Genitourinary: Negative for hematuria.  Musculoskeletal: Negative for falls and myalgias.  Skin: Negative for rash.  Neurological: Positive for weakness. Negative for dizziness, tingling, tremors, sensory change, speech change, focal weakness and loss of consciousness.  Endo/Heme/Allergies: Does not bruise/bleed easily.  Psychiatric/Behavioral: Negative for substance abuse.  The patient is not nervous/anxious.   All other systems reviewed and are negative.     Physical Exam/Data:   Vitals:   12/12/18 2236 12/13/18 0158 12/13/18 0416 12/13/18 0851  BP: 122/76  105/69 118/79  Pulse: 80  71 70  Resp:   17   Temp: 98.3 F (36.8 C)  97.7 F (36.5 C)   TempSrc: Oral  Oral   SpO2: 91% 90% 93%   Weight:      Height:        Intake/Output Summary (Last 24 hours) at 12/13/2018 1340 Last data filed at 12/13/2018 1117 Gross per 24 hour  Intake 480 ml  Output 50 ml  Net 430 ml   Filed Weights   12/11/18 0914 12/11/18 1351  Weight: 98 kg 97.7 kg   Body mass index is 30.91 kg/m.   Physical Exam: General: Well developed, well nourished, in no acute distress. Head: Normocephalic, atraumatic, sclera non-icteric, no xanthomas, nares without discharge.  Neck: Negative for carotid bruits. JVD not elevated. Lungs: Mildly diminished breath sounds along the bilateral bases with crackles breathing is unlabored. Heart: RRR with S1 S2. II/VI systolic murmur RUSB, no rubs, or gallops appreciated. Abdomen: Soft, non-tender, non-distended with normoactive bowel sounds. No hepatomegaly. No rebound/guarding. No obvious abdominal masses. Msk:  Strength and tone appear normal for age. Extremities: No clubbing or cyanosis.  Trace swelling noted along the right ankle. Distal pedal pulses are 2+ and equal bilaterally. Neuro: Alert and oriented X 3. No facial asymmetry. No focal deficit. Moves all extremities spontaneously. Psych:  Responds to questions appropriately with a normal affect.   EKG:  The EKG was personally reviewed and demonstrates: NSR with sinus arrhythmia vs PACs, 68 bpm, significant baseline wandering throughout all leads making interpretation difficult if not impossible.  Telemetry:  Telemetry was personally reviewed and demonstrates: Sinus rhythm with PACs and PVCs with short run of narrow complex tachycardia consistent with atrial tach/SVT though cannot  definitively rule out A. fib secondary to significant underlying artifact  Weights: Filed Weights   12/11/18 0914 12/11/18 1351  Weight: 98 kg 97.7 kg    Relevant CV Studies: 2D Echo 12/12/2018: 1. Severe hypokinesis of the left ventricular, entire anterior wall, anterolateral wall and apical segment.  2. There is dyskinesis of the left ventricular, basal inferior wall.  3. The left ventricle has moderate-severely reduced systolic function, with an ejection fraction of 30-35%. The cavity size was normal. There is mildly increased left ventricular wall thickness. Left ventricular diastolic Doppler parameters are consistent with restrictive filling. Elevated mean left atrial pressure.  4. Left ventricular apical thrombus cannot be excluded. Consider limited echo with Definity or cardiac MRI for further evaluation, as clinically indicated.  5. Large pleural effusion in the left lateral region.  6. The mitral valve is degenerative. Mild thickening of the mitral valve leaflet. Mitral valve regurgitation is moderate by color flow Doppler.  7. The tricuspid valve is grossly normal.  8. The aortic valve has an indeterminate number of cusps. Severely thickening of the aortic valve. Moderate calcification of the aortic valve. Aortic valve regurgitation is moderate by color flow Doppler. Severe stenosis of the aortic valve.  9. The interatrial septum was not well visualized. __________  Limited Echo 12/12/2018: Pending  Laboratory Data:  Chemistry Recent Labs  Lab 12/11/18 0927 12/11/18 1426 12/12/18 0404 12/13/18 0502  NA 135  --  135 134*  K 4.3  --  4.6 4.3  CL 105  --  102 104  CO2 18*  --  19* 18*  GLUCOSE 143*  --  151* 153*  BUN 38*  --  44* 52*  CREATININE 1.79* 1.52* 1.67* 1.64*  CALCIUM 9.9  --  9.3 9.6  GFRNONAA 34* 41* 36* 37*  GFRAA  39* 47* 42* 43*  ANIONGAP 12  --  14 12    Recent Labs  Lab 12/11/18 0927  PROT 7.7  ALBUMIN 4.6  AST 22  ALT 12  ALKPHOS 62  BILITOT  1.5*   Hematology Recent Labs  Lab 12/11/18 0927 12/12/18 0404 12/13/18 0502  WBC 10.0 6.6 12.3*  RBC 4.22 3.84* 3.63*  HGB 14.3 13.0 12.1*  HCT 42.0 38.6* 35.6*  MCV 99.5 100.5* 98.1  MCH 33.9 33.9 33.3  MCHC 34.0 33.7 34.0  RDW 14.6 14.3 14.1  PLT 204 177 174   Cardiac Enzymes Recent Labs  Lab 12/11/18 0927  TROPONINI 0.04*   No results for input(s): TROPIPOC in the last 168 hours.  BNP Recent Labs  Lab 12/11/18 0927 12/13/18 0502  BNP 3,449.0* 3,136.0*    DDimer No results for input(s): DDIMER in the last 168 hours.  Radiology/Studies:  Ct Angio Chest Pe W And/or Wo Contrast  Result Date: 12/11/2018 IMPRESSION: 1.  No demonstrable pulmonary embolus. 2. Prominence of the ascending thoracic aorta measuring 4.1 x 4.1 cm in transverse diameter. There is aortic atherosclerosis as well as foci of great vessel and coronary artery calcification. No dissection evident; the contrast bolus in the aorta is not sufficient for meaningful assessment for potential dissection. Recommend annual imaging followup by CTA or MRA. This recommendation follows 2010 ACCF/AHA/AATS/ACR/ASA/SCA/SCAI/SIR/STS/SVM Guidelines for the Diagnosis and Management of Patients with Thoracic Aortic Disease. Circulation. 2010; 121: Y403-K742. Aortic aneurysm NOS (ICD10-I71.9). 3. Prominence of the main pulmonary outflow tract, an appearance indicative of a degree of pulmonary arterial hypertension. 4. Sizable free-flowing pleural effusions bilaterally with bibasilar atelectatic change. Mosaic attenuation elsewhere raises question of underlying small airways obstructive disease. 5. Several prominent lymph nodes of uncertain etiology. Evidence of prior granulomatous disease with several calcified mediastinal lymph nodes. There is also calcification in the splenic artery consistent with prior granulomatous disease. Aortic Atherosclerosis (ICD10-I70.0). Electronically Signed   By: Lowella Grip III M.D.   On:  12/11/2018 11:25   Dg Chest Port 1 View  Result Date: 12/11/2018 IMPRESSION: Mild bibasilar subsegmental atelectasis is noted with minimal pleural effusions. Aortic Atherosclerosis (ICD10-I70.0). Electronically Signed   By: Marijo Conception M.D.   On: 12/11/2018 09:47    Assessment and Plan:   1.  Acute hypoxic respiratory distress/acute biventricular failure/PAH/bilateral pleural effusions: -He continues to appear mildly volume up though surprisingly he did have breath sounds along the bilateral bases -He does indicate vigorous urine output with IV Lasix and that this was just starting to be recorded this afternoon leading to falsely low documented urine output -Patient will need a right and left cardiac catheterization to further evaluate his cardiomyopathy as well as part of his aortic valve work-up -Never a smoker and denies significant lifestyle exposure -Seen by pulmonology in 6/23 with recommendation for therapeutic/diagnostic thoracentesis.  However, patient preferred to defer this in the inpatient setting secondary to desire to be discharged back home to help with his wife's care.  Follow-up with pulmonology as directed -Patient has a strong preference to be discharged today to assist in the care of his wife at home with outpatient cardiac work-up -In this setting, he will be continued on Toprol-XL and torsemide at current doses with recommended close follow-up in our clinic within 1 week to schedule right and left cardiac cath -Lisinopril held secondary to worsening renal function -Recommend discontinuing amlodipine given newly diagnosed cardiomyopathy -Repeat limited echo with Definity pending to evaluate for apical mural thrombus  2.  Severe aortic stenosis with dilated thoracic ascending aorta: -Indeterminate number of aortic valve cusps on TTE this admission -Long discussion with patient regarding work-up, and treatment of valvular heart disease in setting of cardiomyopathy -He  prefers to be discharged as outlined above with outpatient follow-up and also states he needs some time to process this information -Plan for close follow-up in our office with right and left cardiac cath as outlined above followed by referral to CVTS versus structural heart clinic based on ischemic evaluation -Strict return to ED precautions were discussed  3.  Elevated troponin: -Initial troponin 0 0.04, not cycled -Admission EKG is essentially uninterpretable secondary to diffuse baseline wandering -Repeat EKG -Never with chest pain -Echo demonstrating cardiomyopathy as above -Continue ASA, Toprol, and Lipitor -He will need ischemic evaluation as above which will occur as an outpatient given patient's strong preference for discharge home today to assist in the care of his wife  4.  AKI: -Baseline serum creatinine appears to be 1.2-1.3 -Most recent BUN/SCr 52/1.64 -Lisinopril on hold -He will need follow-up BMP at hospital follow-up visit  5.  Thoracic lymphadenopathy: -Uncertain etiology -Per IM  6.  Leukocytosis: -Likely in the setting of IV steroids   7.  Hyperglycemia: -Likely in the setting of IV steroid use -Recommend A1c  8.  Hypertension: -Blood pressures well controlled -Discontinue amlodipine as above given his cardiomyopathy -Lisinopril held in the setting of AKI -Continue Toprol and torsemide  9.  Hyperlipidemia: -LDL of 65 from 07/2018 -Remains on atorvastatin   For questions or updates, please contact [facility] Please consult [email or web address] for contact info under Cardiology/STEMI.   Signed, Christell Faith, PA-C [place] Pager: [phone] 12/13/2018, 1:40 PM

## 2018-12-14 ENCOUNTER — Telehealth: Payer: Self-pay | Admitting: *Deleted

## 2018-12-14 NOTE — Telephone Encounter (Signed)
Copied from [place] [id number]. Topic: Appointment Scheduling - Scheduling Inquiry for Clinic >> Dec 14, 2018  1:47 PM Yvette Rack wrote: Reason for CRM: Pt stated he was told to schedule a follow up appt to the testing that was done at the hospital. Attempted to transfer pt to the office but I was placed on hold. Pt requests call back to schedule appt. Cb# [phone]

## 2018-12-15 ENCOUNTER — Telehealth: Payer: Self-pay | Admitting: Internal Medicine

## 2018-12-15 MED ORDER — TORSEMIDE 20 MG PO TABS: 20.0000 mg | ORAL_TABLET | Freq: Every day | ORAL | 0 refills | Status: DC

## 2018-12-15 MED ORDER — PREDNISONE 10 MG PO TABS: ORAL_TABLET | ORAL | 0 refills | Status: AC

## 2018-12-15 NOTE — Telephone Encounter (Signed)
Pt scheduled  

## 2018-12-15 NOTE — Telephone Encounter (Signed)
TCM....  Patient is being discharged   They saw R. Dunn   They are scheduled to see R. Dunn on 7/2  They were seen for acute hypoxic respiratory distress/acute biventricular failure/PAH/bilateral pleural effusions  They need to be seen within 1 week  Pt not added to wait list   Please call

## 2018-12-15 NOTE — Telephone Encounter (Signed)
Spoke with patient and let him know the inhaler would still come from [facility] but that we would send the Torsemide and Prednisone to his local pharmacy. He verbalized understanding and was very Patent attorney.  Rx's sent.

## 2018-12-15 NOTE — Telephone Encounter (Signed)
Chart reviewed. The patient's newly prescribed medications were sent to a mail order pharmacy by the admitting team rather than a local pharmacy. He has been without these since his discharge.   Ok to fill torsemide 20 mg daily as well as prednisone as noted in his discharge summary, which was prednisone 10 mg; 40 mg x 1 day, 30 mg x 1 day, 20 mg x 1 day, 10 mg x 1 day.

## 2018-12-15 NOTE — Telephone Encounter (Signed)
Spoke with [facility] pharmacy about the medications. The inhaler Budesonide is due to ship to patient today. The torsemide and prednisone are still in process. It could still be 3-5 days before the patient would receive that medication. The [facility] pharmacy technician cancelled the torsemide and prednisone for me. I will plan to sent those locally once I verify with provider this is ok.  Routing to [employer], [place] who saw patient 12/13/18 in the hospital and has appointment with patient.

## 2018-12-15 NOTE — Telephone Encounter (Signed)
Patient contacted regarding discharge from [facility] on 12/13/18.  Patient understands to follow up with provider Laurine Blazer on 12/21/18 at 3 pm at [facility]. Patient understands discharge instructions? Yes  Patient understands medications and regiment? Yes His issue is there are a few medications that were sent to [facility] order pharmacy which he has not received yet. Therefore, he has not been able to take those specific ones. The ones he does not have are Torsemide, Prednisone and Budesonide. Advised I will call [employer] and see where they are in the process and then figure if sending to local pharmacy is best thing to do. Patient was very Patent attorney.   Patient understands to bring all medications to this visit? yes

## 2018-12-19 NOTE — Progress Notes (Signed)
Cardiology Office Note Date:  12/21/2018  Patient ID:  [name] [name], [name] [name] 08/21/1930, MRN [id number] PCP:  Einar Pheasant, MD  Cardiologist:  Dr. Saunders Revel, MD    Chief Complaint: Hospital follow up  History of Present Illness: JALAN FARISS is a 85 y.o. male with history of recently diagnosed HFrEF in late 11/2018, hypertension, hyperlipidemia, asthma, arthritis, gout, and BPH who presents for hospital follow-up after recent admission to [facility] from 6/22 through 9/37 for acute systolic CHF.  Patient was previous evaluated by [facility] cardiology in 2015 for murmur.  Patient indicated he was told he had a leaky heart valve at that time and underwent nuclear stress test which was unrevealing per his report.  We do not have these records for review.  Following that, he has not followed up with cardiology.  Patient initially saw his PCP on 6/22 for a scheduled physical but reported he did not feel well.  It was noted he was hypoxic with ambulation back to the exam room with documented O2 saturation of 86 to 89% on room air with some tightness in his throat and upper chest.  In the setting, he was sent to the hospital and was admitted with increased exertional shortness of breath for least 3 weeks prior that was associated with a nonproductive cough and wheezing.  CTA chest was negative for PE with prominence of the ascending aorta measuring 4.1 x 4.1 cm with aortic atherosclerosis as well as coronary artery calcification and evidence of pulmonary hypertension.  There were sizable free-flowing pleural effusions bilaterally with bibasilar atelectatic change as well as several prominent lymph nodes of uncertain etiology.  Initial BNP was elevated at 3449 with initial troponin of 0.04 and not cycled.  COVID-19 testing was negative.  He was initially evaluated by pulmonology with recommendation for thoracentesis though the patient preferred to defer this as outpatient as he wanted to get back home to assist with his  wife's health.  Echo on 6/23 showed severe hypokinesis of the entire anterior wall, anterolateral wall, and apical segment with dyskinesis of the basal inferior wall, EF 30 to 35%, Doppler parameters consistent with restrictive filling with elevated mean left atrial pressure, unable to exclude LV apical thrombus, large pleural effusion in the left lateral region, degenerative mitral valve with moderate mitral regurgitation, indeterminate number of aortic valve cusps with severe aortic stenosis with a mean gradient of 42 mmHg and a valve area of 0.58 cm, moderate aortic valve insufficiency, and low normal RV systolic function, RV systolic pressure at least 65 to 70 mmHg.  Repeat limited echo with Definity remains pending.  Patient was gently diuresed with slight bump in renal function with improvement in symptoms back to baseline.  Further work-up was discussed in detail with patient, however he strongly preferred to be discharged at that time to go help with the care of his wife with outpatient cardiology follow-up.  In this setting, his amlodipine was discontinued and he was continued on torsemide and metoprolol.  Documented discharge weight of 97.7 kg with oxygen saturation of 93%.  Discharge cardiac medications: aspirin 81 mg, atorvastatin 10 mg, HCTZ 12.5 mg, Toprol-XL 50 mg twice daily, torsemide 20 mg daily.  Discharge labs: Magnesium 2.2, WBC 12.3, Hgb 12.1, PLT 174, sodium 134, potassium 4.3, BUN 52, serum creatinine 1.64 (baseline approximately 1.2), AST/ALT normal, albumin 4.6  Patient comes in doing quite well following his hospital discharge.  He indicates his breathing is significantly improved.  His weight is down from a  discharge weight of 97.7 kg to a current weight of 94 kg.  He denies any chest pain, shortness of breath, lower extremity swelling, palpitations, dizziness, presyncope, syncope, abdominal distention, orthopnea, PND, early satiety.  In fact, he states his appetite has picked up  significantly.  He was seen in virtual follow-up by PCP on 7/1 with discontinuation of HCTZ given he was also placed on torsemide during his admission.  No falls, BRBPR, or melena.  He is watching his fluid intake and is not eating foods high in sodium or adding salt to food.  He is interested in moving forward with cardiac work-up.   Past Medical History:  Diagnosis Date   Arthritis    right shoulder   Asthma    BPH (benign prostatic hypertrophy)    Gout    Heart murmur    History of chicken pox    Hypercholesterolemia    Hypertension    Wears dentures    full upper and lower    Past Surgical History:  Procedure Laterality Date   arm surgery     right arm fx s/p "plate insertion"   CATARACT EXTRACTION W/PHACO Right 11/17/2016   Procedure: CATARACT EXTRACTION PHACO AND INTRAOCULAR LENS PLACEMENT ([place])  Right;  Surgeon: Leandrew Koyanagi, MD;  Location: [place];  Service: Ophthalmology;  Laterality: Right;   DENTAL SURGERY     all teeth extracted    Current Meds  Medication Sig   allopurinol (ZYLOPRIM) 100 MG tablet TAKE 1 TABLET EVERY DAY   aspirin 81 MG tablet Take 81 mg by mouth daily.   atorvastatin (LIPITOR) 10 MG tablet TAKE 1 TABLET EVERY DAY   Budesonide 90 MCG/ACT inhaler Inhale 2 puffs into the lungs 2 (two) times daily.   colchicine (COLCRYS) 0.6 MG tablet Take 1 tablet (0.6 mg total) by mouth 2 (two) times daily as needed.   feeding supplement, ENSURE ENLIVE, (ENSURE ENLIVE) LIQD Take 237 mLs by mouth 2 (two) times daily between meals.   LORazepam (ATIVAN) 1 MG tablet Takes 1/2 to 1 tablet q day prn   metoprolol succinate (TOPROL-XL) 50 MG 24 hr tablet Take 1 tablet (50 mg total) by mouth 2 (two) times a day. Take with or immediately following a meal.   torsemide (DEMADEX) 20 MG tablet Take 1 tablet (20 mg total) by mouth daily.   [DISCONTINUED] hydrochlorothiazide (MICROZIDE) 12.5 MG capsule TAKE 1 CAPSULE EVERY DAY     Allergies:   Patient has no known allergies.   Social History:  The patient  reports that he has never smoked. His smokeless tobacco use includes chew. He reports that he does not drink alcohol or use drugs.   Family History:  The patient's family history includes Cancer in his daughter; Congestive Heart Failure in his mother; Leukemia in his father.  ROS:   Review of Systems  Constitutional: Negative for chills, diaphoresis, fever, malaise/fatigue and weight loss.  HENT: Negative for congestion.   Eyes: Negative for discharge and redness.  Respiratory: Negative for cough, hemoptysis, sputum production, shortness of breath and wheezing.   Cardiovascular: Negative for chest pain, palpitations, orthopnea, claudication, leg swelling and PND.  Gastrointestinal: Negative for abdominal pain, blood in stool, heartburn, melena, nausea and vomiting.  Genitourinary: Negative for hematuria.  Musculoskeletal: Negative for falls and myalgias.  Skin: Negative for rash.  Neurological: Negative for dizziness, tingling, tremors, sensory change, speech change, focal weakness, loss of consciousness and weakness.  Endo/Heme/Allergies: Does not bruise/bleed easily.  Psychiatric/Behavioral: Negative for substance  abuse. The patient is not nervous/anxious.   All other systems reviewed and are negative.    PHYSICAL EXAM:  VS:  BP 130/70 (BP Location: Right Arm, Patient Position: Sitting, Cuff Size: Normal)    Pulse 69    Temp 98.1 F (36.7 C) (Temporal)    Ht 5\' 11"  (1.803 m)    Wt 208 lb 12 oz (94.7 kg)    SpO2 97%    BMI 29.11 kg/m  BMI: Body mass index is 29.11 kg/m.  Physical Exam  Constitutional: He is oriented to person, place, and time. He appears well-developed and well-nourished.  HENT:  Head: Normocephalic and atraumatic.  Eyes: Right eye exhibits no discharge. Left eye exhibits no discharge.  Neck: Normal range of motion. No JVD present.  Cardiovascular: Normal rate, regular rhythm, S1  normal and S2 normal. Exam reveals no distant heart sounds, no friction rub, no midsystolic click and no opening snap.  Murmur heard.  Harsh midsystolic murmur is present with a grade of 3/6 at the upper right sternal border radiating to the neck. Pulses:      Posterior tibial pulses are 2+ on the right side and 2+ on the left side.  Pulmonary/Chest: Effort normal and breath sounds normal. No respiratory distress. He has no decreased breath sounds. He has no wheezes. He has no rales. He exhibits no tenderness.  Abdominal: Soft. He exhibits no distension. There is no abdominal tenderness.  Musculoskeletal:        General: No edema.  Neurological: He is alert and oriented to person, place, and time.  Skin: Skin is warm and dry. No cyanosis. Nails show no clubbing.  Psychiatric: He has a normal mood and affect. His speech is normal and behavior is normal. Judgment and thought content normal.     EKG:  Was ordered and interpreted by me today. Shows NSR, 69 bpm, LVH with early repolarization abnormality, nonspecific ST-T changes, baseline artifact  Recent Labs: 03/21/2018: TSH 2.61 12/11/2018: ALT 12 12/13/2018: B Natriuretic Peptide 3,136.0; BUN 52; Creatinine, Ser 1.64; Hemoglobin 12.1; Magnesium 2.2; Platelets 174; Potassium 4.3; Sodium 134  08/09/2018: Cholesterol 111; HDL 36.20; LDL Cholesterol 65; Total CHOL/HDL Ratio 3; Triglycerides 50.0; VLDL 10.0   Estimated Creatinine Clearance: 38 mL/min (A) (by C-G formula based on SCr of 1.64 mg/dL (H)).   Wt Readings from Last 3 Encounters:  12/21/18 208 lb 12 oz (94.7 kg)  12/11/18 215 lb 6.4 oz (97.7 kg)  12/11/18 216 lb 9.6 oz (98.2 kg)     Other studies reviewed: Additional studies/records reviewed today include: summarized above  ASSESSMENT AND PLAN:  1. HFrEF/bi-V failure: Etiology uncertain.  He has NYHA class II symptoms.  His volume status is significantly improved.  He appears euvolemic and well compensated.  Continue current dose  of torsemide 20 mg daily.  Agree with PCP stopping hydrochlorothiazide on 7/1.  Continue Toprol-XL 50 mg twice daily.  I have not yet added an ACE inhibitor/ARB/Entresto/spironolactone pending follow-up BMP to reassess his renal function.  This will be drawn first part of next week given he was discharged on both thiazide diuretic and loop diuretic with thiazide diuretic being held just on 7/1.  He will be scheduled for a right and left cardiac catheterization on 7/14.  CHF education was discussed in detail.  Following optimization of evidence-based heart failure therapy and ischemic evaluation we will plan for repeat echocardiogram in several months time.  2. Severe aortic stenosis: Appears asymptomatic.  Schedule right and left cardiac catheterization  as above.  Pending results of his cardiac catheterization we will either refer him to the structural heart team for TAVR consideration versus CVTS for open procedure.  3. PAH: As above.  Following the above catheterization consider referral to advanced heart failure clinic in [place].  4. Dilated thoracic ascending aorta: Work-up as above with consideration for referral to CVTS.  Optimal blood pressure control.  5. AKI: Repeat BMP first part of next week now that thiazide diuretic has been held.  6. Thoracic lymphadenopathy: Uncertain etiology.  Follow-up with PCP.  7. Hypertension: Blood pressure is reasonably controlled today.  Pending a follow-up BMP look to add ACE inhibitor/ARB/Entresto/spironolactone next week to optimize heart failure regimen.  8. Hyperlipidemia: LDL of 65 from 07/2018.  Remains on atorvastatin.  Disposition: F/u with Dr. Saunders Revel or an APP in 2 weeks.  Current medicines are reviewed at length with the patient today.  The patient did not have any concerns regarding medicines.  Signed, Christell Faith, PA-C 12/21/2018 4:05 PM     [place] [place], [place] [address] [phone]

## 2018-12-19 NOTE — H&P (View-Only) (Signed)
Cardiology Office Note Date:  12/21/2018  Patient ID:  [name] [name], [name] [name] 12-21-1930, MRN [id number] PCP:  Einar Pheasant, MD  Cardiologist:  Dr. Saunders Revel, MD    Chief Complaint: Hospital follow up  History of Present Illness: [name] [name] is a 85 y.o. male with history of recently diagnosed HFrEF in late 11/2018, hypertension, hyperlipidemia, asthma, arthritis, gout, and BPH who presents for hospital follow-up after recent admission to [facility] from 6/22 through 6/46 for acute systolic CHF.  Patient was previous evaluated by [facility] cardiology in 2015 for murmur.  Patient indicated he was told he had a leaky heart valve at that time and underwent nuclear stress test which was unrevealing per his report.  We do not have these records for review.  Following that, he has not followed up with cardiology.  Patient initially saw his PCP on 6/22 for a scheduled physical but reported he did not feel well.  It was noted he was hypoxic with ambulation back to the exam room with documented O2 saturation of 86 to 89% on room air with some tightness in his throat and upper chest.  In the setting, he was sent to the hospital and was admitted with increased exertional shortness of breath for least 3 weeks prior that was associated with a nonproductive cough and wheezing.  CTA chest was negative for PE with prominence of the ascending aorta measuring 4.1 x 4.1 cm with aortic atherosclerosis as well as coronary artery calcification and evidence of pulmonary hypertension.  There were sizable free-flowing pleural effusions bilaterally with bibasilar atelectatic change as well as several prominent lymph nodes of uncertain etiology.  Initial BNP was elevated at 3449 with initial troponin of 0.04 and not cycled.  COVID-19 testing was negative.  He was initially evaluated by pulmonology with recommendation for thoracentesis though the patient preferred to defer this as outpatient as he wanted to get back home to assist with his  wife's health.  Echo on 6/23 showed severe hypokinesis of the entire anterior wall, anterolateral wall, and apical segment with dyskinesis of the basal inferior wall, EF 30 to 35%, Doppler parameters consistent with restrictive filling with elevated mean left atrial pressure, unable to exclude LV apical thrombus, large pleural effusion in the left lateral region, degenerative mitral valve with moderate mitral regurgitation, indeterminate number of aortic valve cusps with severe aortic stenosis with a mean gradient of 42 mmHg and a valve area of 0.58 cm, moderate aortic valve insufficiency, and low normal RV systolic function, RV systolic pressure at least 65 to 70 mmHg.  Repeat limited echo with Definity remains pending.  Patient was gently diuresed with slight bump in renal function with improvement in symptoms back to baseline.  Further work-up was discussed in detail with patient, however he strongly preferred to be discharged at that time to go help with the care of his wife with outpatient cardiology follow-up.  In this setting, his amlodipine was discontinued and he was continued on torsemide and metoprolol.  Documented discharge weight of 97.7 kg with oxygen saturation of 93%.  Discharge cardiac medications: aspirin 81 mg, atorvastatin 10 mg, HCTZ 12.5 mg, Toprol-XL 50 mg twice daily, torsemide 20 mg daily.  Discharge labs: Magnesium 2.2, WBC 12.3, Hgb 12.1, PLT 174, sodium 134, potassium 4.3, BUN 52, serum creatinine 1.64 (baseline approximately 1.2), AST/ALT normal, albumin 4.6  Patient comes in doing quite well following his hospital discharge.  He indicates his breathing is significantly improved.  His weight is down from a  discharge weight of 97.7 kg to a current weight of 94 kg.  He denies any chest pain, shortness of breath, lower extremity swelling, palpitations, dizziness, presyncope, syncope, abdominal distention, orthopnea, PND, early satiety.  In fact, he states his appetite has picked up  significantly.  He was seen in virtual follow-up by PCP on 7/1 with discontinuation of HCTZ given he was also placed on torsemide during his admission.  No falls, BRBPR, or melena.  He is watching his fluid intake and is not eating foods high in sodium or adding salt to food.  He is interested in moving forward with cardiac work-up.   Past Medical History:  Diagnosis Date   Arthritis    right shoulder   Asthma    BPH (benign prostatic hypertrophy)    Gout    Heart murmur    History of chicken pox    Hypercholesterolemia    Hypertension    Wears dentures    full upper and lower    Past Surgical History:  Procedure Laterality Date   arm surgery     right arm fx s/p "plate insertion"   CATARACT EXTRACTION W/PHACO Right 11/17/2016   Procedure: CATARACT EXTRACTION PHACO AND INTRAOCULAR LENS PLACEMENT ([place])  Right;  Surgeon: Leandrew Koyanagi, MD;  Location: [place];  Service: Ophthalmology;  Laterality: Right;   DENTAL SURGERY     all teeth extracted    Current Meds  Medication Sig   allopurinol (ZYLOPRIM) 100 MG tablet TAKE 1 TABLET EVERY DAY   aspirin 81 MG tablet Take 81 mg by mouth daily.   atorvastatin (LIPITOR) 10 MG tablet TAKE 1 TABLET EVERY DAY   Budesonide 90 MCG/ACT inhaler Inhale 2 puffs into the lungs 2 (two) times daily.   colchicine (COLCRYS) 0.6 MG tablet Take 1 tablet (0.6 mg total) by mouth 2 (two) times daily as needed.   feeding supplement, ENSURE ENLIVE, (ENSURE ENLIVE) LIQD Take 237 mLs by mouth 2 (two) times daily between meals.   LORazepam (ATIVAN) 1 MG tablet Takes 1/2 to 1 tablet q day prn   metoprolol succinate (TOPROL-XL) 50 MG 24 hr tablet Take 1 tablet (50 mg total) by mouth 2 (two) times a day. Take with or immediately following a meal.   torsemide (DEMADEX) 20 MG tablet Take 1 tablet (20 mg total) by mouth daily.   [DISCONTINUED] hydrochlorothiazide (MICROZIDE) 12.5 MG capsule TAKE 1 CAPSULE EVERY DAY     Allergies:   Patient has no known allergies.   Social History:  The patient  reports that he has never smoked. His smokeless tobacco use includes chew. He reports that he does not drink alcohol or use drugs.   Family History:  The patient's family history includes Cancer in his daughter; Congestive Heart Failure in his mother; Leukemia in his father.  ROS:   Review of Systems  Constitutional: Negative for chills, diaphoresis, fever, malaise/fatigue and weight loss.  HENT: Negative for congestion.   Eyes: Negative for discharge and redness.  Respiratory: Negative for cough, hemoptysis, sputum production, shortness of breath and wheezing.   Cardiovascular: Negative for chest pain, palpitations, orthopnea, claudication, leg swelling and PND.  Gastrointestinal: Negative for abdominal pain, blood in stool, heartburn, melena, nausea and vomiting.  Genitourinary: Negative for hematuria.  Musculoskeletal: Negative for falls and myalgias.  Skin: Negative for rash.  Neurological: Negative for dizziness, tingling, tremors, sensory change, speech change, focal weakness, loss of consciousness and weakness.  Endo/Heme/Allergies: Does not bruise/bleed easily.  Psychiatric/Behavioral: Negative for substance  abuse. The patient is not nervous/anxious.   All other systems reviewed and are negative.    PHYSICAL EXAM:  VS:  BP 130/70 (BP Location: Right Arm, Patient Position: Sitting, Cuff Size: Normal)    Pulse 69    Temp 98.1 F (36.7 C) (Temporal)    Ht 5\' 11"  (1.803 m)    Wt 208 lb 12 oz (94.7 kg)    SpO2 97%    BMI 29.11 kg/m  BMI: Body mass index is 29.11 kg/m.  Physical Exam  Constitutional: He is oriented to person, place, and time. He appears well-developed and well-nourished.  HENT:  Head: Normocephalic and atraumatic.  Eyes: Right eye exhibits no discharge. Left eye exhibits no discharge.  Neck: Normal range of motion. No JVD present.  Cardiovascular: Normal rate, regular rhythm, S1  normal and S2 normal. Exam reveals no distant heart sounds, no friction rub, no midsystolic click and no opening snap.  Murmur heard.  Harsh midsystolic murmur is present with a grade of 3/6 at the upper right sternal border radiating to the neck. Pulses:      Posterior tibial pulses are 2+ on the right side and 2+ on the left side.  Pulmonary/Chest: Effort normal and breath sounds normal. No respiratory distress. He has no decreased breath sounds. He has no wheezes. He has no rales. He exhibits no tenderness.  Abdominal: Soft. He exhibits no distension. There is no abdominal tenderness.  Musculoskeletal:        General: No edema.  Neurological: He is alert and oriented to person, place, and time.  Skin: Skin is warm and dry. No cyanosis. Nails show no clubbing.  Psychiatric: He has a normal mood and affect. His speech is normal and behavior is normal. Judgment and thought content normal.     EKG:  Was ordered and interpreted by me today. Shows NSR, 69 bpm, LVH with early repolarization abnormality, nonspecific ST-T changes, baseline artifact  Recent Labs: 03/21/2018: TSH 2.61 12/11/2018: ALT 12 12/13/2018: B Natriuretic Peptide 3,136.0; BUN 52; Creatinine, Ser 1.64; Hemoglobin 12.1; Magnesium 2.2; Platelets 174; Potassium 4.3; Sodium 134  08/09/2018: Cholesterol 111; HDL 36.20; LDL Cholesterol 65; Total CHOL/HDL Ratio 3; Triglycerides 50.0; VLDL 10.0   Estimated Creatinine Clearance: 38 mL/min (A) (by C-G formula based on SCr of 1.64 mg/dL (H)).   Wt Readings from Last 3 Encounters:  12/21/18 208 lb 12 oz (94.7 kg)  12/11/18 215 lb 6.4 oz (97.7 kg)  12/11/18 216 lb 9.6 oz (98.2 kg)     Other studies reviewed: Additional studies/records reviewed today include: summarized above  ASSESSMENT AND PLAN:  1. HFrEF/bi-V failure: Etiology uncertain.  He has NYHA class II symptoms.  His volume status is significantly improved.  He appears euvolemic and well compensated.  Continue current dose  of torsemide 20 mg daily.  Agree with PCP stopping hydrochlorothiazide on 7/1.  Continue Toprol-XL 50 mg twice daily.  I have not yet added an ACE inhibitor/ARB/Entresto/spironolactone pending follow-up BMP to reassess his renal function.  This will be drawn first part of next week given he was discharged on both thiazide diuretic and loop diuretic with thiazide diuretic being held just on 7/1.  He will be scheduled for a right and left cardiac catheterization on 7/14.  CHF education was discussed in detail.  Following optimization of evidence-based heart failure therapy and ischemic evaluation we will plan for repeat echocardiogram in several months time.  2. Severe aortic stenosis: Appears asymptomatic.  Schedule right and left cardiac catheterization  as above.  Pending results of his cardiac catheterization we will either refer him to the structural heart team for TAVR consideration versus CVTS for open procedure.  3. PAH: As above.  Following the above catheterization consider referral to advanced heart failure clinic in [place].  4. Dilated thoracic ascending aorta: Work-up as above with consideration for referral to CVTS.  Optimal blood pressure control.  5. AKI: Repeat BMP first part of next week now that thiazide diuretic has been held.  6. Thoracic lymphadenopathy: Uncertain etiology.  Follow-up with PCP.  7. Hypertension: Blood pressure is reasonably controlled today.  Pending a follow-up BMP look to add ACE inhibitor/ARB/Entresto/spironolactone next week to optimize heart failure regimen.  8. Hyperlipidemia: LDL of 65 from 07/2018.  Remains on atorvastatin.  Disposition: F/u with Dr. Saunders Revel or an APP in 2 weeks.  Current medicines are reviewed at length with the patient today.  The patient did not have any concerns regarding medicines.  Signed, Christell Faith, PA-C 12/21/2018 4:05 PM     [place], [place] [address] [phone]

## 2018-12-20 ENCOUNTER — Other Ambulatory Visit: Payer: Self-pay

## 2018-12-20 ENCOUNTER — Ambulatory Visit (INDEPENDENT_AMBULATORY_CARE_PROVIDER_SITE_OTHER): Payer: Medicare HMO | Admitting: Internal Medicine

## 2018-12-20 ENCOUNTER — Other Ambulatory Visit: Payer: Self-pay | Admitting: Internal Medicine

## 2018-12-20 ENCOUNTER — Encounter: Payer: Self-pay | Admitting: Internal Medicine

## 2018-12-20 DIAGNOSIS — I350 Nonrheumatic aortic (valve) stenosis: Secondary | ICD-10-CM

## 2018-12-20 DIAGNOSIS — J9601 Acute respiratory failure with hypoxia: Secondary | ICD-10-CM

## 2018-12-20 DIAGNOSIS — I5021 Acute systolic (congestive) heart failure: Secondary | ICD-10-CM | POA: Diagnosis not present

## 2018-12-20 DIAGNOSIS — E7800 Pure hypercholesterolemia, unspecified: Secondary | ICD-10-CM | POA: Diagnosis not present

## 2018-12-20 DIAGNOSIS — I10 Essential (primary) hypertension: Secondary | ICD-10-CM | POA: Diagnosis not present

## 2018-12-20 DIAGNOSIS — I5082 Biventricular heart failure: Secondary | ICD-10-CM | POA: Diagnosis not present

## 2018-12-20 DIAGNOSIS — E1159 Type 2 diabetes mellitus with other circulatory complications: Secondary | ICD-10-CM | POA: Diagnosis not present

## 2018-12-20 DIAGNOSIS — R0602 Shortness of breath: Secondary | ICD-10-CM

## 2018-12-20 MED ORDER — BUDESONIDE 90 MCG/ACT IN AEPB: 1.0000 | INHALATION_SPRAY | Freq: Two times a day (BID) | RESPIRATORY_TRACT | 1 refills | Status: DC

## 2018-12-20 MED ORDER — LORAZEPAM 1 MG PO TABS: ORAL_TABLET | ORAL | 2 refills | Status: DC

## 2018-12-20 NOTE — Progress Notes (Signed)
Patient ID: [name] [name], male   DOB: March 19, 1931, 85 y.o.   MRN: [id number]   Virtual Visit via telephone Note  This visit type was conducted due to national recommendations for restrictions regarding the COVID-19 pandemic (e.g. social distancing).  This format is felt to be most appropriate for this patient at this time.  All issues noted in this document were discussed and addressed.  No physical exam was performed (except for noted visual exam findings with Video Visits).   I connected with [name] by  telephone and verified that I am speaking with the correct person using two identifiers. Location patient: home Location provider: work or home office Persons participating in the virtual visit: patient, provider  I discussed the limitations, risks, security and privacy concerns of performing an evaluation and management service by telephone and the availability of in person appointments. The patient expressed understanding and agreed to proceed.   Reason for visit: hospital follow up.    HPI: Presents for hospital follow up.  He was admitted 12/11/18- 12/13/18 secondary to acute respiratory failure, felt to be secondary to CHF exacerbation.  CT angiogram negative for PE, found to have evidence of prior granulomatous disease.  Was treated with methylprednisolone and started on torsemide.  ECHO revealed EF 30-35% with mention of possible left apical thrombus.  Also noted to have moderate MR, moderate AR and severe aortic stenosis.  Elevated troponin felt likely to be related to demand ischemia.  Was also found to have AKI.  Lisinopril and amlodipine held.  Since his discharge, he reports feeling better.  Breathing is better.  States it is easier to take a deep breath.  Reports his "breathing function" is easier.  Is out walking in his yard.  Appetite is coming back.  Drinking some ensure.  No checking his blood pressure.  No chest pain.  No abdominal pain.  Bowels moving.  Has f/u with  cardiology tomorrow.      ROS: See pertinent positives and negatives per HPI.  Past Medical History:  Diagnosis Date  . Arthritis    right shoulder  . Asthma   . BPH (benign prostatic hypertrophy)   . Gout   . Heart murmur   . History of chicken pox   . Hypercholesterolemia   . Hypertension   . Wears dentures    full upper and lower    Past Surgical History:  Procedure Laterality Date  . arm surgery     right arm fx s/p "plate insertion"  . CATARACT EXTRACTION W/PHACO Right 11/17/2016   Procedure: CATARACT EXTRACTION PHACO AND INTRAOCULAR LENS PLACEMENT ([place])  Right;  Surgeon: Leandrew Koyanagi, MD;  Location: [facility];  Service: Ophthalmology;  Laterality: Right;  . DENTAL SURGERY     all teeth extracted    Family History  Problem Relation Age of Onset  . Leukemia Father   . Congestive Heart Failure Mother   . Cancer Daughter        Breast Cancer  . Prostate cancer Neg Hx   . Colon cancer Neg Hx     SOCIAL HX: reviewed.    Current Outpatient Medications:  .  allopurinol (ZYLOPRIM) 100 MG tablet, TAKE 1 TABLET EVERY DAY, Disp: 90 tablet, Rfl: 3 .  aspirin 81 MG tablet, Take 81 mg by mouth daily., Disp: , Rfl:  .  atorvastatin (LIPITOR) 10 MG tablet, TAKE 1 TABLET EVERY DAY, Disp: 90 tablet, Rfl: 0 .  Budesonide 90 MCG/ACT inhaler, Inhale 2 puffs into  the lungs 2 (two) times daily., Disp: , Rfl:  .  colchicine (COLCRYS) 0.6 MG tablet, Take 1 tablet (0.6 mg total) by mouth 2 (two) times daily as needed., Disp: 60 tablet, Rfl: 0 .  feeding supplement, ENSURE ENLIVE, (ENSURE ENLIVE) LIQD, Take 237 mLs by mouth 2 (two) times daily between meals., Disp: 237 mL, Rfl: 12 .  fluticasone (FLOVENT DISKUS) 50 MCG/BLIST diskus inhaler, Inhale 2 puffs into the lungs 2 (two) times daily., Disp: 1 Inhaler, Rfl: 1 .  LORazepam (ATIVAN) 1 MG tablet, Takes 1/2 to 1 tablet q day prn, Disp: 30 tablet, Rfl: 2 .  metoprolol succinate (TOPROL-XL) 50 MG 24 hr tablet, Take 1  tablet (50 mg total) by mouth 2 (two) times a day. Take with or immediately following a meal., Disp: 30 tablet, Rfl: 0 .  torsemide (DEMADEX) 20 MG tablet, Take 1 tablet (20 mg total) by mouth daily., Disp: 30 tablet, Rfl: 0  EXAM:  GENERAL: alert.  Sounds to be in no acute distress.  Answering questions appropriately.   PSYCH/NEURO: pleasant and cooperative, no obvious depression or anxiety, speech and thought processing grossly intact  ASSESSMENT AND PLAN:  Discussed the following assessment and plan:  Acute respiratory failure with hypoxia ([place]) Admitted 12/11/18 with acute respiratory failure with hypoxia.  Was found to have reduced heart function, severe aortic stenosis and pulmonary hypertension.  Placed on torsemide and metoprolol. Gentle diuresis.  Planning for f/u with cardiology tomorrow with plans to complete cardiac w/up.  He reports breathing better, noting O2 sats since discharge have been in the mid 90s.  Has not been able to get his steroid inhaler.  Trying to get his prescriptions.    Acute systolic CHF (congestive heart failure) ([place]) Recently admitted as outlined.  Reduced heart function (EF 30-35%).  On torsemide and metoprolol.  ACE inhibitor held secondary to AKI.  Sees cardiology tomorrow with plans for further cardiac w/up (including right and left heart catheterization).  Will need f/u metabolic panel to reassess kidney function.  Will stop hctz given started on torsemide.    Biventricular failure [facility]) Admitted with acute respiratory failure as outlined.  Started on torsemide.  Planning for f/u with cardiology tomorrow.  Hold hctz.  Needs f/u metabolic panel.   Diabetes Sugars have been controlled. Follow sugars.  Follow metabolic panel and J6G.    Hypercholesterolemia On lipitor.  Low cholesterol diet and exercise.  Follow lipid panel and liver function tests.    Hypertension Off ace inhibitor secondary to AKI.  Amlodipine stopped.  Started on torsemide and  metoprolol.  Will stop hctz given taking torsemide.  Follow pressures.  Follow kidney function.  May be able to add back ace inhibitor.    Severe aortic stenosis Planning to f/u with cardiology tomorrow for further cardiac w/up and evaluation.  Planning for f/u right and left heart cath.    SOB (shortness of breath) Breathing better.  Budesonide inhaler sent in to local pharmacy.  Will need f/u with pulmonary.  Found to have thoracic lymphadenopathy on scan.      I discussed the assessment and treatment plan with the patient. The patient was provided an opportunity to ask questions and all were answered. The patient agreed with the plan and demonstrated an understanding of the instructions.   The patient was advised to call back or seek an in-person evaluation if the symptoms worsen or if the condition fails to improve as anticipated.  I provided 22 minutes of non-face-to-face time during  this encounter.   Einar Pheasant, MD

## 2018-12-21 ENCOUNTER — Ambulatory Visit (INDEPENDENT_AMBULATORY_CARE_PROVIDER_SITE_OTHER): Payer: Medicare HMO | Admitting: Physician Assistant

## 2018-12-21 ENCOUNTER — Other Ambulatory Visit: Payer: Self-pay | Admitting: Physician Assistant

## 2018-12-21 ENCOUNTER — Encounter: Payer: Self-pay | Admitting: Physician Assistant

## 2018-12-21 VITALS — BP 130/70 | HR 69 | Temp 98.1°F | Ht 71.0 in | Wt 208.8 lb

## 2018-12-21 DIAGNOSIS — I350 Nonrheumatic aortic (valve) stenosis: Secondary | ICD-10-CM | POA: Diagnosis not present

## 2018-12-21 DIAGNOSIS — I5082 Biventricular heart failure: Secondary | ICD-10-CM

## 2018-12-21 DIAGNOSIS — R590 Localized enlarged lymph nodes: Secondary | ICD-10-CM | POA: Diagnosis not present

## 2018-12-21 DIAGNOSIS — I272 Pulmonary hypertension, unspecified: Secondary | ICD-10-CM | POA: Diagnosis not present

## 2018-12-21 DIAGNOSIS — I77810 Thoracic aortic ectasia: Secondary | ICD-10-CM | POA: Diagnosis not present

## 2018-12-21 DIAGNOSIS — E785 Hyperlipidemia, unspecified: Secondary | ICD-10-CM

## 2018-12-21 DIAGNOSIS — I5021 Acute systolic (congestive) heart failure: Secondary | ICD-10-CM

## 2018-12-21 DIAGNOSIS — I10 Essential (primary) hypertension: Secondary | ICD-10-CM

## 2018-12-21 DIAGNOSIS — I5022 Chronic systolic (congestive) heart failure: Secondary | ICD-10-CM

## 2018-12-21 DIAGNOSIS — N179 Acute kidney failure, unspecified: Secondary | ICD-10-CM

## 2018-12-21 MED ORDER — FLOVENT DISKUS 50 MCG/BLIST IN AEPB: 2.0000 | INHALATION_SPRAY | Freq: Two times a day (BID) | RESPIRATORY_TRACT | 1 refills | Status: DC

## 2018-12-21 NOTE — Patient Instructions (Addendum)
Labs need to be done next week and no appointment needed for this. CBC & BMET at the [facility] in at the front desk and they will tell you where to go. Please take labs slips with you for that.   On July 10th between 12:30 pm and 2:30 pm you will need COVID testing for your procedure. You will come to the drive thru area at the [facility] and they will perform test. You will need to go home and quarantine until your procedure on July 14th.    [facility] Cardiac Cath Instructions   You are scheduled for a Cardiac Cath on: July 14th with Dr. Saunders Revel  Please arrive at 07:30 am on the day of your procedure  Please expect a call from our [facility] to pre-register you  Do not eat/drink anything after midnight  Someone will need to drive you home  It is recommended someone be with you for the first 24 hours after your procedure  Wear clothes that are easy to get on/off and wear slip on shoes if possible   Medications bring a current list of all medications with you  _XX__ Do not take TORSEMIDE the morning of your procedure.   _XX__ You may take all of your other medications the morning of your procedure with enough water to swallow safely   Day of your procedure: Arrive at the [facility] entrance.  Free valet service is available.  After entering the [facility] please check-in at the registration desk (1st desk on your right) to receive your armband. After receiving your armband someone will escort you to the cardiac cath/special procedures waiting area.  The usual length of stay after your procedure is about 2 to 3 hours.  This can vary.  If you have any questions, please call our office at [phone], or you may call the cardiac cath lab at [facility] directly at [phone]  Your physician recommends that you schedule a follow-up appointment for the week of July 20th with Dr. Saunders Revel or Christell Faith PA-C

## 2018-12-21 NOTE — Telephone Encounter (Signed)
I sent in clarification on flovent inhaler.  The first rx was sent in with no directions. Apparently the budesonide inhaler was not covered.  Please notify pt we had to change inhaler.      Dr Nicki Reaper

## 2018-12-24 ENCOUNTER — Encounter: Payer: Self-pay | Admitting: Internal Medicine

## 2018-12-24 NOTE — Assessment & Plan Note (Signed)
Planning to f/u with cardiology tomorrow for further cardiac w/up and evaluation.  Planning for f/u right and left heart cath.

## 2018-12-24 NOTE — Assessment & Plan Note (Signed)
Off ace inhibitor secondary to AKI.  Amlodipine stopped.  Started on torsemide and metoprolol.  Will stop hctz given taking torsemide.  Follow pressures.  Follow kidney function.  May be able to add back ace inhibitor.

## 2018-12-24 NOTE — Assessment & Plan Note (Signed)
Sugars have been controlled. Follow sugars.  Follow metabolic panel and N5Z.

## 2018-12-24 NOTE — Assessment & Plan Note (Signed)
Recently admitted as outlined.  Reduced heart function (EF 30-35%).  On torsemide and metoprolol.  ACE inhibitor held secondary to AKI.  Sees cardiology tomorrow with plans for further cardiac w/up (including right and left heart catheterization).  Will need f/u metabolic panel to reassess kidney function.  Will stop hctz given started on torsemide.

## 2018-12-24 NOTE — Assessment & Plan Note (Signed)
Breathing better.  Budesonide inhaler sent in to local pharmacy.  Will need f/u with pulmonary.  Found to have thoracic lymphadenopathy on scan.

## 2018-12-24 NOTE — Assessment & Plan Note (Signed)
Admitted 12/11/18 with acute respiratory failure with hypoxia.  Was found to have reduced heart function, severe aortic stenosis and pulmonary hypertension.  Placed on torsemide and metoprolol. Gentle diuresis.  Planning for f/u with cardiology tomorrow with plans to complete cardiac w/up.  He reports breathing better, noting O2 sats since discharge have been in the mid 90s.  Has not been able to get his steroid inhaler.  Trying to get his prescriptions.

## 2018-12-24 NOTE — Assessment & Plan Note (Signed)
Admitted with acute respiratory failure as outlined.  Started on torsemide.  Planning for f/u with cardiology tomorrow.  Hold hctz.  Needs f/u metabolic panel.

## 2018-12-24 NOTE — Assessment & Plan Note (Signed)
On lipitor.  Low cholesterol diet and exercise.  Follow lipid panel and liver function tests.   

## 2018-12-27 ENCOUNTER — Other Ambulatory Visit: Payer: Medicare HMO

## 2018-12-29 ENCOUNTER — Other Ambulatory Visit: Payer: Self-pay

## 2018-12-29 ENCOUNTER — Other Ambulatory Visit
Admission: RE | Admit: 2018-12-29 | Discharge: 2018-12-29 | Disposition: A | Discharge status: Home / Self Care | Payer: Medicare HMO | Source: Ambulatory Visit | Attending: Internal Medicine | Admitting: Internal Medicine

## 2018-12-29 ENCOUNTER — Other Ambulatory Visit
Admission: RE | Admit: 2018-12-29 | Discharge: 2018-12-29 | Disposition: A | Discharge status: Home / Self Care | Payer: Medicare HMO | Source: Ambulatory Visit | Attending: Physician Assistant | Admitting: Physician Assistant

## 2018-12-29 DIAGNOSIS — I5022 Chronic systolic (congestive) heart failure: Secondary | ICD-10-CM | POA: Insufficient documentation

## 2018-12-29 DIAGNOSIS — Z01812 Encounter for preprocedural laboratory examination: Secondary | ICD-10-CM | POA: Diagnosis not present

## 2018-12-29 DIAGNOSIS — I350 Nonrheumatic aortic (valve) stenosis: Secondary | ICD-10-CM | POA: Insufficient documentation

## 2018-12-29 DIAGNOSIS — Z1159 Encounter for screening for other viral diseases: Secondary | ICD-10-CM | POA: Diagnosis not present

## 2018-12-29 LAB — BASIC METABOLIC PANEL
Anion gap: 13 (ref 5–15)
BUN: 36 mg/dL — ABNORMAL HIGH (ref 8–23)
CO2: 23 mmol/L (ref 22–32)
Calcium: 9.4 mg/dL (ref 8.9–10.3)
Chloride: 101 mmol/L (ref 98–111)
Creatinine, Ser: 1.36 mg/dL — ABNORMAL HIGH (ref 0.61–1.24)
GFR calc Af Amer: 54 mL/min — ABNORMAL LOW (ref >60)
GFR calc non Af Amer: 47 mL/min — ABNORMAL LOW (ref >60)
Glucose, Bld: 129 mg/dL — ABNORMAL HIGH (ref 70–99)
Potassium: 3.7 mmol/L (ref 3.5–5.1)
Sodium: 137 mmol/L (ref 135–145)

## 2018-12-29 LAB — CBC
HCT: 37.8 % — ABNORMAL LOW (ref 39.0–52.0)
Hemoglobin: 12.7 g/dL — ABNORMAL LOW (ref 13.0–17.0)
MCH: 33.3 pg (ref 26.0–34.0)
MCHC: 33.6 g/dL (ref 30.0–36.0)
MCV: 99.2 fL (ref 80.0–100.0)
Platelets: 171 10*3/uL (ref 150–400)
RBC: 3.81 MIL/uL — ABNORMAL LOW (ref 4.22–5.81)
RDW: 13.4 % (ref 11.5–15.5)
WBC: 9.1 10*3/uL (ref 4.0–10.5)
nRBC: 0 % (ref 0.0–0.2)

## 2018-12-30 LAB — SARS CORONAVIRUS 2 (TAT 6-24 HRS): SARS Coronavirus 2: NEGATIVE

## 2019-01-02 ENCOUNTER — Other Ambulatory Visit: Payer: Self-pay

## 2019-01-02 ENCOUNTER — Encounter
Admission: RE | Disposition: A | Discharge status: Home / Self Care | Payer: Self-pay | Source: Home / Self Care | Attending: Internal Medicine

## 2019-01-02 ENCOUNTER — Telehealth: Payer: Self-pay

## 2019-01-02 ENCOUNTER — Ambulatory Visit
Admission: RE | Admit: 2019-01-02 | Discharge: 2019-01-02 | Disposition: A | Discharge status: Home / Self Care | Payer: Medicare HMO | Attending: Internal Medicine | Admitting: Internal Medicine

## 2019-01-02 DIAGNOSIS — Z7982 Long term (current) use of aspirin: Secondary | ICD-10-CM | POA: Diagnosis not present

## 2019-01-02 DIAGNOSIS — I5021 Acute systolic (congestive) heart failure: Secondary | ICD-10-CM | POA: Diagnosis not present

## 2019-01-02 DIAGNOSIS — F17290 Nicotine dependence, other tobacco product, uncomplicated: Secondary | ICD-10-CM | POA: Insufficient documentation

## 2019-01-02 DIAGNOSIS — I110 Hypertensive heart disease with heart failure: Secondary | ICD-10-CM | POA: Insufficient documentation

## 2019-01-02 DIAGNOSIS — I350 Nonrheumatic aortic (valve) stenosis: Secondary | ICD-10-CM

## 2019-01-02 DIAGNOSIS — N179 Acute kidney failure, unspecified: Secondary | ICD-10-CM | POA: Diagnosis not present

## 2019-01-02 DIAGNOSIS — I272 Pulmonary hypertension, unspecified: Secondary | ICD-10-CM | POA: Insufficient documentation

## 2019-01-02 DIAGNOSIS — M1990 Unspecified osteoarthritis, unspecified site: Secondary | ICD-10-CM | POA: Insufficient documentation

## 2019-01-02 DIAGNOSIS — J45909 Unspecified asthma, uncomplicated: Secondary | ICD-10-CM | POA: Insufficient documentation

## 2019-01-02 DIAGNOSIS — I5082 Biventricular heart failure: Secondary | ICD-10-CM

## 2019-01-02 DIAGNOSIS — M109 Gout, unspecified: Secondary | ICD-10-CM | POA: Insufficient documentation

## 2019-01-02 DIAGNOSIS — R590 Localized enlarged lymph nodes: Secondary | ICD-10-CM | POA: Insufficient documentation

## 2019-01-02 DIAGNOSIS — E785 Hyperlipidemia, unspecified: Secondary | ICD-10-CM | POA: Diagnosis not present

## 2019-01-02 DIAGNOSIS — E7800 Pure hypercholesterolemia, unspecified: Secondary | ICD-10-CM | POA: Insufficient documentation

## 2019-01-02 DIAGNOSIS — Z8249 Family history of ischemic heart disease and other diseases of the circulatory system: Secondary | ICD-10-CM | POA: Diagnosis not present

## 2019-01-02 DIAGNOSIS — Z79899 Other long term (current) drug therapy: Secondary | ICD-10-CM | POA: Insufficient documentation

## 2019-01-02 HISTORY — PX: RIGHT HEART CATH AND CORONARY ANGIOGRAPHY: CATH118264

## 2019-01-02 SURGERY — RIGHT HEART CATH AND CORONARY ANGIOGRAPHY

## 2019-01-02 SURGERY — RIGHT/LEFT HEART CATH AND CORONARY ANGIOGRAPHY
Anesthesia: Moderate Sedation | Laterality: Bilateral

## 2019-01-02 MED ORDER — SODIUM CHLORIDE 0.9% FLUSH: 3.0000 mL | INTRAVENOUS | Status: DC | PRN

## 2019-01-02 MED ORDER — SODIUM CHLORIDE 0.9 % IV SOLN: 250.0000 mL | INTRAVENOUS | Status: DC | PRN

## 2019-01-02 MED ORDER — HYDRALAZINE HCL 20 MG/ML IJ SOLN: 5.0000 mg | INTRAMUSCULAR | Status: DC | PRN

## 2019-01-02 MED ORDER — HEPARIN SODIUM (PORCINE) 1000 UNIT/ML IJ SOLN
INTRAMUSCULAR | Status: DC | PRN
  Administered 2019-01-02: 4500 [IU] via INTRAVENOUS

## 2019-01-02 MED ORDER — FENTANYL CITRATE (PF) 100 MCG/2ML IJ SOLN
INTRAMUSCULAR | Status: DC | PRN
  Administered 2019-01-02: 12.5 ug via INTRAVENOUS

## 2019-01-02 MED ORDER — HEPARIN (PORCINE) IN NACL 1000-0.9 UT/500ML-% IV SOLN
INTRAVENOUS | Status: AC
  Filled 2019-01-02: qty 1000

## 2019-01-02 MED ORDER — SODIUM CHLORIDE 0.9% FLUSH: 3.0000 mL | Freq: Two times a day (BID) | INTRAVENOUS | Status: DC

## 2019-01-02 MED ORDER — MIDAZOLAM HCL 2 MG/2ML IJ SOLN
INTRAMUSCULAR | Status: DC | PRN
  Administered 2019-01-02: 0.5 mg via INTRAVENOUS

## 2019-01-02 MED ORDER — HEPARIN SODIUM (PORCINE) 1000 UNIT/ML IJ SOLN
INTRAMUSCULAR | Status: AC
  Filled 2019-01-02: qty 1

## 2019-01-02 MED ORDER — FENTANYL CITRATE (PF) 100 MCG/2ML IJ SOLN
INTRAMUSCULAR | Status: AC
  Filled 2019-01-02: qty 2

## 2019-01-02 MED ORDER — HEPARIN (PORCINE) IN NACL 1000-0.9 UT/500ML-% IV SOLN
INTRAVENOUS | Status: DC | PRN
  Administered 2019-01-02: 500 mL

## 2019-01-02 MED ORDER — IOHEXOL 300 MG/ML  SOLN
INTRAMUSCULAR | Status: DC | PRN
  Administered 2019-01-02: 70 mL via INTRA_ARTERIAL

## 2019-01-02 MED ORDER — SODIUM CHLORIDE 0.9 % IV SOLN
INTRAVENOUS | Status: DC
  Administered 2019-01-02: 08:00:00 via INTRAVENOUS

## 2019-01-02 MED ORDER — VERAPAMIL HCL 2.5 MG/ML IV SOLN
INTRAVENOUS | Status: DC | PRN
  Administered 2019-01-02: 2.5 mg via INTRA_ARTERIAL

## 2019-01-02 MED ORDER — VERAPAMIL HCL 2.5 MG/ML IV SOLN
INTRAVENOUS | Status: AC
  Filled 2019-01-02: qty 2

## 2019-01-02 MED ORDER — MIDAZOLAM HCL 2 MG/2ML IJ SOLN
INTRAMUSCULAR | Status: AC
  Filled 2019-01-02: qty 2

## 2019-01-02 MED ORDER — ACETAMINOPHEN 325 MG PO TABS: 650.0000 mg | ORAL_TABLET | ORAL | Status: DC | PRN

## 2019-01-02 MED ORDER — ONDANSETRON HCL 4 MG/2ML IJ SOLN: 4.0000 mg | Freq: Four times a day (QID) | INTRAMUSCULAR | Status: DC | PRN

## 2019-01-02 SURGICAL SUPPLY — 9 items
CATH 5F 110X4 TIG (CATHETERS) ×2 IMPLANT
CATH BALLN WEDGE 5F 110CM (CATHETERS) ×2 IMPLANT
DEVICE RAD TR BAND REGULAR (VASCULAR PRODUCTS) ×2 IMPLANT
GLIDESHEATH SLEND SS 6F .021 (SHEATH) ×2 IMPLANT
KIT MANI 3VAL PERCEP (MISCELLANEOUS) ×4 IMPLANT
KIT RIGHT HEART (MISCELLANEOUS) ×4 IMPLANT
PACK CARDIAC CATH (CUSTOM PROCEDURE TRAY) ×4 IMPLANT
SHEATH GLIDE SLENDER 4/5FR (SHEATH) ×2 IMPLANT
WIRE ROSEN-J .035X260CM (WIRE) ×2 IMPLANT

## 2019-01-02 NOTE — Telephone Encounter (Signed)
TOC-  Awaiting [facility] appt  Urgent referral placed to [facility] for Dx severe aortic stenosis.

## 2019-01-02 NOTE — Discharge Instructions (Signed)
Radial Site Care Refer to this sheet in the next few weeks. These instructions provide you with information about caring for yourself after your procedure. Your health care provider may also give you more specific instructions. Your treatment has been planned according to current medical practices, but problems sometimes occur. Call your health care provider if you have any problems or questions after your procedure. What can I expect after the procedure? After your procedure, it is typical to have the following: Bruising at the radial site that usually fades within 1-2 weeks. Blood collecting in the tissue (hematoma) that may be painful to the touch. It should usually decrease in size and tenderness within 1-2 weeks.  Follow these instructions at home: Take medicines only as directed by your health care provider. If you are on a medication called Metformin please do not take for 48 hours after your procedure. Over the next 48hrs please increase your fluid intake of water and non caffeine beverages to flush the contrast dye out of your system.  You may shower 24 hours after the procedure  Leave your bandage on and gently wash the site with plain soap and water. Pat the area dry with a clean towel. Do not rub the site, because this may cause bleeding.  Remove your dressing 48hrs after your procedure and leave open to air.  Do not submerge your site in water for 7 days. This includes swimming and washing dishes.  Check your insertion site every day for redness, swelling, or drainage. Do not apply powder or lotion to the site. Do not flex or bend the affected arm for 24 hours or as directed by your health care provider. Do not push or pull heavy objects with the affected arm for 24 hours or as directed by your health care provider. Do not lift over 10 lb (4.5 kg) for 5 days after your procedure or as directed by your health care provider. Ask your health care provider when it is okay to: Return to  work or school. Resume usual physical activities or sports. Resume sexual activity. Do not drive home if you are discharged the same day as the procedure. Have someone else drive you. You may drive 48 hours after the procedure Do not operate machinery or power tools for 24 hours after the procedure. If your procedure was done as an outpatient procedure, which means that you went home the same day as your procedure, a responsible adult should be with you for the first 24 hours after you arrive home. Keep all follow-up visits as directed by your health care provider. This is important. Contact a health care provider if: You have a fever. You have chills. You have increased bleeding from the radial site. Hold pressure on the site. Get help right away if: You have unusual pain at the radial site. You have redness, warmth, or swelling at the radial site. You have drainage (other than a small amount of blood on the dressing) from the radial site. The radial site is bleeding, and the bleeding does not stop after 15 minutes of holding steady pressure on the site. Your arm or hand becomes pale, cool, tingly, or numb. This information is not intended to replace advice given to you by your health care provider. Make sure you discuss any questions you have with your health care provider. Document Released: 07/10/2010 Document Revised: 11/13/2015 Document Reviewed: 12/24/2013 Elsevier Interactive Patient Education  2018 [employer]  

## 2019-01-02 NOTE — Telephone Encounter (Signed)
Patient contacted regarding discharge from [facility] on 01/02/19.  Patient understands to follow up with provider Christell Faith, PA on 01/09/19 at 2:0pm at [facility]. Patient understands discharge instructions? yes Patient understands medications and regiment? yes Patient understands to bring all medications to this visit? yes  Adv pt that Dr.End would like for him to have labwork (bmet) on 01/08/19. Pt will [facility] drawn [facility] mall. Adv the pt of the consult scheduled at [facility] with Dr.Bartle. pt made aware of the appt date, time, and location. Provided the pt with the [facility] telephone number. Pt has no questions or concerns at this time

## 2019-01-02 NOTE — Telephone Encounter (Signed)
-----   Message from Nelva Bush, MD sent at 01/02/2019  9:25 AM EDT ----- Regarding: Post-cath f/u Hello,  Could you help arrange the following visits for [name]?  Thanks.  BMP next Monday (7/20). F/u with me or APP in 1-2 weeks. Outpatient cardiac surgery referral (ideally Dr. Roxy Manns or [facility]) for severe aortic stenosis and coronary artery disease  Let me know if any questions come up.  Thanks.  Gerald Stabs

## 2019-01-02 NOTE — Interval H&P Note (Signed)
History and Physical Interval Note:  01/02/2019 8:20 AM  [name] [name]  has presented today for cardiac catheterization, with the diagnosis of acute systolic congestive heart failure biventricular failure severe aortic valve stenosis.  The various methods of treatment have been discussed with the patient and family. After consideration of risks, benefits and other options for treatment, the patient has consented to  Procedure(s): RIGHT/LEFT HEART CATH AND CORONARY ANGIOGRAPHY (Bilateral) as a surgical intervention.  The patient's history has been reviewed, patient examined, no change in status, stable for surgery.  I have reviewed the patient's chart and labs.  Questions were answered to the patient's satisfaction.    Cath Lab Visit (complete for each Cath Lab visit)  Clinical Evaluation Leading to the Procedure:   ACS: No.  Non-ACS:    Anginal/HF Classification: NYHA II  Anti-ischemic medical therapy: Minimal Therapy (1 class of medications)  Non-Invasive Test Results: No non-invasive testing performed (LVEF 30-35% with severe AS by echo)  Prior CABG: No previous CABG  [name] [name]

## 2019-01-03 ENCOUNTER — Encounter: Payer: Self-pay | Admitting: Internal Medicine

## 2019-01-04 NOTE — Progress Notes (Signed)
Cardiology Office Note Date:  01/09/2019  Patient ID:  [name] [name] Oct 19, 1930, MRN [id number] PCP:  Einar Pheasant, MD  Cardiologist:  Dr. Saunders Revel, MD    Chief Complaint: Follow up diagnostic cath  History of Present Illness: [name] [name] is a 85 y.o. male with history of recently diagnosed HFrEF in late 11/2018 with subsequent diagnosis of multi-vessel CAD by cath in 12/2018, severe aortic stenosis, hypertension, hyperlipidemia, asthma, arthritis, gout, and BPH who presents for follow up of diagnostic cath.  Patient was previous evaluated by [facility] cardiology in 2015 for murmur.  Patient indicated he was told he had a leaky heart valve at that time and underwent nuclear stress test which was unrevealing per his report.  We do not have these records for review.  Following that, he had not followed up with cardiology.  Patient initially saw his PCP on 6/22 for a scheduled physical but reported he did not feel well.  It was noted he was hypoxic with ambulation back to the exam room with documented O2 saturation of 86 to 89% on room air with some tightness in his throat and upper chest.  In the setting, he was sent to the hospital and was admitted with increased exertional shortness of breath for least 3 weeks prior that was associated with a nonproductive cough and wheezing.  CTA chest was negative for PE with prominence of the ascending aorta measuring 4.1 x 4.1 cm with aortic atherosclerosis as well as coronary artery calcification and evidence of pulmonary hypertension.  There were sizable free-flowing pleural effusions bilaterally with bibasilar atelectatic change as well as several prominent lymph nodes of uncertain etiology.  Initial BNP was elevated at 3449 with initial troponin of 0.04 and not cycled.  COVID-19 testing was negative.  He was initially evaluated by pulmonology with recommendation for thoracentesis though the patient preferred to defer this as outpatient as he wanted to get  back home to assist with his wife's health.  Echo on 6/23 showed severe hypokinesis of the entire anterior wall, anterolateral wall, and apical segment with dyskinesis of the basal inferior wall, EF 30 to 35%, Doppler parameters consistent with restrictive filling with elevated mean left atrial pressure, unable to exclude LV apical thrombus, large pleural effusion in the left lateral region, degenerative mitral valve with moderate mitral regurgitation, indeterminate number of aortic valve cusps with severe aortic stenosis with a mean gradient of 42 mmHg and a valve area of 0.58 cm, moderate aortic valve insufficiency, and low normal RV systolic function, RV systolic pressure at least 65 to 70 mmHg.  Patient was gently diuresed with slight bump in renal function with improvement in symptoms back to baseline.  Further work-up was discussed in detail with patient, however he strongly preferred to be discharged at that time to go help with the care of his wife with outpatient cardiology follow-up. He followed up with cardiology on 12/21/2018 and was feeling well, noting significant improvement in breathing and stable weight. He underwent R/LHC on 01/02/2019 that showed significant two-vessel CAD, including 80% ostial LAD stenosis and 80-90% stenosis of large RCA continuation, mild to moderate disease noted in the proximal/mid LAD, RCA, and ramus, mildly elevated right heart filling pressures, moderately elevated left heart filling pressures, severe pulmonary hypertension, moderately reduced CO/CI. He was referred to CVTS for consultation of CABG given two-vessel CAD, with the LAD not being ideal for PCI, and in the setting of severe aortic stenosis. He subsequently cancelled his appointment with cardiothoracic surgery.  Patient comes in continuing to do reasonably well.  He denies any chest pain.  He does note some increased shortness of breath particularly if he is out in the excessive heat for prolonged timeframes.   Otherwise, he notes no change in symptoms.  No palpitations, dizziness, presyncope, lower extremity swelling, abdominal distention, orthopnea, PND, or early satiety.  No falls since he was last seen.  No BRBPR or melena.  Weight remains stable with the patient being down 2 pounds today when compared to his last office visit.  He continues to be interested in following up with cardiothoracic surgery.  He had issues with transportation leading to his cancellation of CVTS appointment last week.  He is continuing to eat a diet low in sodium and drink less than 2 L of fluid daily.  He is tolerating all medications without issues.  Labs: 12/2018 - SCr 1.42, K+ 3.6 12/2018 - SCr 1.36, K+ 3.7, HGB 12.7 11/2018 - magnesium 2.2, AST/ALT normal 07/2018 - A1c 5.8, TC 111, TG 50, HDL 36, LDL 65   Past Medical History:  Diagnosis Date   Arthritis    right shoulder   Asthma    BPH (benign prostatic hypertrophy)    Gout    Heart murmur    History of chicken pox    Hypercholesterolemia    Hypertension    Wears dentures    full upper and lower    Past Surgical History:  Procedure Laterality Date   arm surgery     right arm fx s/p "plate insertion"   CARDIAC CATHETERIZATION     CATARACT EXTRACTION W/PHACO Right 11/17/2016   Procedure: CATARACT EXTRACTION PHACO AND INTRAOCULAR LENS PLACEMENT ([place])  Right;  Surgeon: Leandrew Koyanagi, MD;  Location: [place];  Service: Ophthalmology;  Laterality: Right;   DENTAL SURGERY     all teeth extracted   RIGHT HEART CATH AND CORONARY ANGIOGRAPHY N/A 01/02/2019   Procedure: RIGHT HEART CATH AND CORONARY ANGIOGRAPHY;  Surgeon: Nelva Bush, MD;  Location: [place] [facility];  Service: Cardiovascular;  Laterality: N/A;    No outpatient medications have been marked as taking for the 01/09/19 encounter (Office Visit) with Rise Mu, PA-C.    Allergies:   Patient has no known allergies.   Social History:  The patient  reports that  he has never smoked. His smokeless tobacco use includes chew. He reports that he does not drink alcohol or use drugs.   Family History:  The patient's family history includes Cancer in his daughter; Congestive Heart Failure in his mother; Leukemia in his father.  ROS:   Review of Systems  Constitutional: Positive for malaise/fatigue. Negative for chills, diaphoresis, fever and weight loss.  HENT: Negative for congestion.   Eyes: Negative for discharge and redness.  Respiratory: Positive for shortness of breath. Negative for cough, hemoptysis, sputum production and wheezing.        Stable SOB that is exacerbated by excessive heat  Cardiovascular: Negative for chest pain, palpitations, orthopnea, claudication, leg swelling and PND.  Gastrointestinal: Negative for abdominal pain, blood in stool, heartburn, melena, nausea and vomiting.  Genitourinary: Negative for hematuria.  Musculoskeletal: Negative for falls and myalgias.  Skin: Negative for rash.  Neurological: Negative for dizziness, tingling, tremors, sensory change, speech change, focal weakness, loss of consciousness and weakness.  Endo/Heme/Allergies: Does not bruise/bleed easily.  Psychiatric/Behavioral: Negative for substance abuse. The patient is not nervous/anxious.   All other systems reviewed and are negative.    PHYSICAL EXAM:  VS:  BP 130/80 (BP Location: Left Arm, Patient Position: Sitting, Cuff Size: Normal)    Pulse 80    Temp (!) 97.2 F (36.2 C)    Ht 5\' 11"  (1.803 m)    Wt 206 lb 12 oz (93.8 kg)    SpO2 97%    BMI 28.84 kg/m  BMI: Body mass index is 28.84 kg/m.  Physical Exam  Constitutional: He is oriented to person, place, and time. He appears well-developed and well-nourished.  HENT:  Head: Normocephalic and atraumatic.  Eyes: Right eye exhibits no discharge. Left eye exhibits no discharge.  Neck: Normal range of motion. No JVD present.  Cardiovascular: Normal rate, regular rhythm, S1 normal and S2 normal. Exam  reveals no distant heart sounds, no friction rub, no midsystolic click and no opening snap.  Murmur heard.  Harsh midsystolic murmur is present with a grade of 3/6 at the upper right sternal border radiating to the neck. Pulses:      Posterior tibial pulses are 2+ on the right side and 2+ on the left side.  Pulmonary/Chest: Effort normal. No respiratory distress. He has decreased breath sounds. He has no wheezes. He has no rales. He exhibits no tenderness.  Abdominal: Soft. He exhibits no distension. There is no abdominal tenderness.  Musculoskeletal:        General: No edema.  Neurological: He is alert and oriented to person, place, and time.  Skin: Skin is warm and dry. No cyanosis. Nails show no clubbing.  Psychiatric: He has a normal mood and affect. His speech is normal and behavior is normal. Judgment and thought content normal.     EKG:  Was ordered and interpreted by me today. Shows NSR, 80 bpm, 1st degree AV block, LVH with early repolarization abnormality, nonspecific st/t changes involving anterolateral leads, grossly unchanged from prior  Recent Labs: 03/21/2018: TSH 2.61 12/11/2018: ALT 12 12/13/2018: B Natriuretic Peptide 3,136.0; Magnesium 2.2 12/29/2018: Hemoglobin 12.7; Platelets 171 01/08/2019: BUN 33; Creatinine, Ser 1.42; Potassium 3.6; Sodium 140  08/09/2018: Cholesterol 111; HDL 36.20; LDL Cholesterol 65; Total CHOL/HDL Ratio 3; Triglycerides 50.0; VLDL 10.0   Estimated Creatinine Clearance: 43.7 mL/min (A) (by C-G formula based on SCr of 1.42 mg/dL (H)).   Wt Readings from Last 3 Encounters:  01/09/19 206 lb 12 oz (93.8 kg)  01/02/19 208 lb (94.3 kg)  12/21/18 208 lb 12 oz (94.7 kg)     Other studies reviewed: Additional studies/records reviewed today include: summarized above  ASSESSMENT AND PLAN:  1. CAD involving the native coronary arteries without angina: He denies any symptoms concerning for angina.  Recent cardiac catheterization from 01/02/2019  demonstrated significant two-vessel CAD, including 80% ostial LAD stenosis and 80 to 90% stenosis of a large RCA continuation along with mild to moderate disease affecting the proximal/mid LAD, RCA, and ramus intermedius.  Interventional cardiology recommended outpatient cardiothoracic surgery consultation given significant two-vessel CAD and known severe aortic stenosis.  Unfortunately, the patient canceled his CVTS appointment last week secondary to transportation issues.  He is agreeable to rescheduling this and will be seen later this week per CVTS coordinator.  Continue aspirin 81 mg daily, Toprol-XL, and Lipitor.  He will discuss potential intervention techniques/modalities with cardiothoracic surgery.  2. HFrEF secondary to ICM/bi-V failure: Recent right heart cath showed continued elevated pressures with a pulmonary capillary wedge pressure of 31 mmHg.  Surprisingly, given patient's IV failure, significant CAD, pulmonary hypertension, and aortic stenosis he continues to indicate he feels well.  Escalate torsemide  to 40 mg daily.  Add losartan 12.5 mg daily.  Continue Toprol-XL 50 mg twice daily.  In follow-up if renal function and potassium allow would add spironolactone.  CHF education.  Following potential surgical revascularization and optimization of evidence-based heart failure therapy we will need to repeat echocardiogram to evaluate for improvement in his EF.  Should his EF remains less than 35% he will need referral to EP.  Would also consider referral to advanced heart failure clinic at that time, following revascularization.  3. Severe aortic stenosis: Right and left cardiac catheterization as above.  Follow-up with CVTS as above.  4. PAH: As above.   5. Dilated thoracic ascending aorta: Work-up and referral to CVTS as above.  6. CKD stage III: Most recent renal function from 01/08/2019 showed stable SCr of 1.42.  Follow-up BMP 1 week after starting losartan.  7. HTN: Blood pressure is  mildly elevated today at 130/80.  Add losartan and titrate torsemide as above.  8. HLD: LDL of 65 from 07/2018 . Remains on Lipitor.   Disposition: F/u with Dr. Saunders Revel or an APP in 4-6 weeks.  Current medicines are reviewed at length with the patient today.  The patient did not have any concerns regarding medicines.  Signed, Christell Faith, PA-C 01/09/2019 1:53 PM     [place] [address] [employer] [place], [place] [address] [phone]

## 2019-01-05 ENCOUNTER — Encounter: Payer: Medicare HMO | Admitting: Surgery

## 2019-01-07 ENCOUNTER — Other Ambulatory Visit: Payer: Self-pay | Admitting: Physician Assistant

## 2019-01-08 ENCOUNTER — Other Ambulatory Visit
Admission: RE | Admit: 2019-01-08 | Discharge: 2019-01-08 | Disposition: A | Discharge status: Home / Self Care | Payer: Medicare HMO | Source: Ambulatory Visit | Attending: Internal Medicine | Admitting: Internal Medicine

## 2019-01-08 ENCOUNTER — Telehealth: Payer: Self-pay | Admitting: Internal Medicine

## 2019-01-08 DIAGNOSIS — I5021 Acute systolic (congestive) heart failure: Secondary | ICD-10-CM | POA: Diagnosis not present

## 2019-01-08 DIAGNOSIS — I350 Nonrheumatic aortic (valve) stenosis: Secondary | ICD-10-CM | POA: Insufficient documentation

## 2019-01-08 LAB — BASIC METABOLIC PANEL
Anion gap: 10 (ref 5–15)
BUN: 33 mg/dL — ABNORMAL HIGH (ref 8–23)
CO2: 23 mmol/L (ref 22–32)
Calcium: 9.6 mg/dL (ref 8.9–10.3)
Chloride: 107 mmol/L (ref 98–111)
Creatinine, Ser: 1.42 mg/dL — ABNORMAL HIGH (ref 0.61–1.24)
GFR calc Af Amer: 51 mL/min — ABNORMAL LOW (ref >60)
GFR calc non Af Amer: 44 mL/min — ABNORMAL LOW (ref >60)
Glucose, Bld: 150 mg/dL — ABNORMAL HIGH (ref 70–99)
Potassium: 3.6 mmol/L (ref 3.5–5.1)
Sodium: 140 mmol/L (ref 135–145)

## 2019-01-08 NOTE — Addendum Note (Signed)
Addended by: Santiago Bur on: 01/08/2019 11:43 AM   Modules accepted: Orders

## 2019-01-08 NOTE — Telephone Encounter (Signed)

## 2019-01-09 ENCOUNTER — Telehealth: Payer: Self-pay

## 2019-01-09 ENCOUNTER — Ambulatory Visit (INDEPENDENT_AMBULATORY_CARE_PROVIDER_SITE_OTHER): Payer: Medicare HMO | Admitting: Physician Assistant

## 2019-01-09 ENCOUNTER — Encounter: Payer: Self-pay | Admitting: Physician Assistant

## 2019-01-09 ENCOUNTER — Other Ambulatory Visit: Payer: Self-pay

## 2019-01-09 VITALS — BP 130/80 | HR 80 | Temp 97.2°F | Ht 71.0 in | Wt 206.8 lb

## 2019-01-09 DIAGNOSIS — I272 Pulmonary hypertension, unspecified: Secondary | ICD-10-CM | POA: Diagnosis not present

## 2019-01-09 DIAGNOSIS — E785 Hyperlipidemia, unspecified: Secondary | ICD-10-CM | POA: Diagnosis not present

## 2019-01-09 DIAGNOSIS — I350 Nonrheumatic aortic (valve) stenosis: Secondary | ICD-10-CM | POA: Diagnosis not present

## 2019-01-09 DIAGNOSIS — I10 Essential (primary) hypertension: Secondary | ICD-10-CM | POA: Diagnosis not present

## 2019-01-09 DIAGNOSIS — I2510 Atherosclerotic heart disease of native coronary artery without angina pectoris: Secondary | ICD-10-CM

## 2019-01-09 DIAGNOSIS — I5082 Biventricular heart failure: Secondary | ICD-10-CM

## 2019-01-09 DIAGNOSIS — N179 Acute kidney failure, unspecified: Secondary | ICD-10-CM | POA: Diagnosis not present

## 2019-01-09 DIAGNOSIS — I5022 Chronic systolic (congestive) heart failure: Secondary | ICD-10-CM

## 2019-01-09 DIAGNOSIS — I77810 Thoracic aortic ectasia: Secondary | ICD-10-CM

## 2019-01-09 MED ORDER — LOSARTAN POTASSIUM 25 MG PO TABS: 12.5000 mg | ORAL_TABLET | Freq: Every day | ORAL | 3 refills | Status: DC

## 2019-01-09 MED ORDER — TORSEMIDE 20 MG PO TABS: 20.0000 mg | ORAL_TABLET | Freq: Two times a day (BID) | ORAL | 3 refills | Status: DC

## 2019-01-09 MED ORDER — TORSEMIDE 20 MG PO TABS: 40.0000 mg | ORAL_TABLET | Freq: Every day | ORAL | 3 refills | Status: DC

## 2019-01-09 NOTE — Patient Instructions (Addendum)
Medication Instructions:  Your physician has recommended you make the following change in your medication:  1. INCREASE Torsemide 20 mg and take 2 tablets (40 mg) once daily. 2. START Losartan 25 mg and take 1/2 tablet (12.5 mg) once daily.  If you need a refill on your cardiac medications before your next appointment, please call your pharmacy.   Lab work: BMET in one week  If you have labs (blood work) drawn today and your tests are completely normal, you will receive your results only by: Marland Kitchen MyChart Message (if you have MyChart) OR . A paper copy in the mail If you have any lab test that is abnormal or we need to change your treatment, we will call you to review the results.  Testing/Procedures: None at this time.   Follow-Up: At [facility], you and your health needs are our priority.  As part of our continuing mission to provide you with exceptional heart care, we have created designated Provider Care Teams.  These Care Teams include your primary Cardiologist (physician) and Advanced Practice Providers (APPs -  Physician Assistants and Nurse Practitioners) who all work together to provide you with the care you need, when you need it. You will need a follow up appointment in 6 weeks with Dr. Harrell Gave End or one of the following Advanced Practice Providers on your designated Care Team:   Murray Hodgkins, NP Christell Faith, PA-C . Marrianne Mood, PA-C  Any Other Special Instructions Will Be Listed Below (If Applicable).

## 2019-01-09 NOTE — Telephone Encounter (Signed)
Called [facility] to reschedule the patient's consult with Dr.Bartle. Spoke with Manuela Schwartz. She will need to talk with her nurse manager to see where they can get the pt in with Dr.Bartle. They will contact the pt directly to schedule.

## 2019-01-11 ENCOUNTER — Other Ambulatory Visit: Payer: Self-pay | Admitting: *Deleted

## 2019-01-11 ENCOUNTER — Institutional Professional Consult (permissible substitution): Payer: Medicare HMO | Admitting: Surgery

## 2019-01-11 ENCOUNTER — Other Ambulatory Visit: Payer: Self-pay

## 2019-01-11 ENCOUNTER — Encounter: Payer: Self-pay | Admitting: Surgery

## 2019-01-11 ENCOUNTER — Encounter: Payer: Self-pay | Admitting: *Deleted

## 2019-01-11 VITALS — BP 112/73 | HR 65 | Temp 97.3°F | Resp 16 | Ht 71.0 in | Wt 204.0 lb

## 2019-01-11 DIAGNOSIS — I2510 Atherosclerotic heart disease of native coronary artery without angina pectoris: Secondary | ICD-10-CM

## 2019-01-11 DIAGNOSIS — I350 Nonrheumatic aortic (valve) stenosis: Secondary | ICD-10-CM

## 2019-01-11 DIAGNOSIS — I7121 Aneurysm of the ascending aorta, without rupture: Secondary | ICD-10-CM

## 2019-01-11 DIAGNOSIS — I712 Thoracic aortic aneurysm, without rupture: Secondary | ICD-10-CM

## 2019-01-11 NOTE — Progress Notes (Signed)
Cardiothoracic Surgery Consultation  PCP is Einar Pheasant, MD Referring Provider is End, Harrell Gave, MD  Chief Complaint  Patient presents with   Aortic Stenosis    Surgical eval, Cardiac Cath 01/02/19, ECHO 12/12/18   Coronary Artery Disease   TAA    ascending    HPI:  The patient is an 85 year old gentleman with history of hypertension, hyperlipidemia, and recently diagnosed heart failure with reduced ejection fraction who was admitted to [facility] on 2/44/0102 for acute systolic congestive heart failure.  The patient reportedly was evaluated by critical clinic cardiology in 2015 for a heart murmur.  The patient says that he was told that he had a problem with his heart valve but we do not have records of that.  He apparently had a nuclear stress test that was unremarkable and after that did not follow-up with cardiology.  He saw his PCP on 12/11/2018 and did not feel well.  He was hypoxic with ambulation and had some tightness in his upper chest and throat.  He reported an increased exertional shortness of breath for the past several weeks as well as nonproductive cough and wheezing and was sent to the hospital.  CTA of the chest ruled out pulmonary embolism.  It showed a 4.1 cm ascending aorta with aortic atherosclerosis and coronary calcification.  There is evidence of pulmonary hypertension.  There were at least moderate sized bilateral pleural effusions with bibasilar atelectatic changes.  His initial BNP was 3449 with initial troponin of 0.04.  He was reportedly evaluated by pulmonary medicine and a thoracentesis was recommended but the patient declined that.  He had an echocardiogram done on 623 which showed severe hypokinesis of the anterior wall, anterolateral wall, and apex with dyskinesis of the basal inferior wall and an ejection fraction of 30 to 35%.  There was moderate mitral regurgitation.  The mean gradient across aortic valve was 42 mmHg with a valve  area of 0.58 cm.  There is moderate aortic insufficiency.  There is low normal RV systolic function with the RV systolic pressure of 65 to 70 mmHg.  He was diuresed with improvement in his symptoms and he was discharged for outpatient cardiology follow-up.  He was scheduled for right and left heart catheterization which was done on 01/02/2019.  This showed severe two-vessel coronary disease with at least 80% ostial LAD stenosis and 80 to 90% stenosis of the distal right coronary artery before a large posterior lateral branch.  There is at least 50% proximal ramus stenosis and 50% ostial stenosis of a large PDA.  Pulmonary pressure was measured at 84/35 with a mean of 55.  Mean pulmonary capillary wedge pressure was 31.  Cardiac index was measured at 1.87.  Pulmonary saturation was 62% with arterial saturations 97%.  Pulmonary vascular resistance was measured at 6 Wood units.  He was scheduled for an appointment in our office couple weeks ago for consideration of coronary bypass surgery and aortic valve replacement but he canceled that appointment and said that he had some other things to take care of.  He was seen back by cardiology on 01/09/2019 and reschedule an appointment with our office today.  At the present time he denies any chest pain or pressure.  He does have exertional shortness of breath and fatigue especially when out in the heat.  He denies any dizziness or syncope.  He has had no orthopnea or PND.  He denies lower extremity swelling.  Past Medical History:  Diagnosis Date  Arthritis    right shoulder   Asthma    BPH (benign prostatic hypertrophy)    Gout    Heart murmur    History of chicken pox    Hypercholesterolemia    Hypertension    Wears dentures    full upper and lower    Past Surgical History:  Procedure Laterality Date   arm surgery     right arm fx s/p "plate insertion"   CARDIAC CATHETERIZATION     CATARACT EXTRACTION W/PHACO Right 11/17/2016    Procedure: CATARACT EXTRACTION PHACO AND INTRAOCULAR LENS PLACEMENT ([place])  Right;  Surgeon: Leandrew Koyanagi, MD;  Location: [place];  Service: Ophthalmology;  Laterality: Right;   DENTAL SURGERY     all teeth extracted   RIGHT HEART CATH AND CORONARY ANGIOGRAPHY N/A 01/02/2019   Procedure: RIGHT HEART CATH AND CORONARY ANGIOGRAPHY;  Surgeon: Nelva Bush, MD;  Location: [place] [facility];  Service: Cardiovascular;  Laterality: N/A;    Family History  Problem Relation Age of Onset   Leukemia Father    Congestive Heart Failure Mother    Cancer Daughter        Breast Cancer   Prostate cancer Neg Hx    Colon cancer Neg Hx     Social History Social History   Tobacco Use   Smoking status: Never Smoker   Smokeless tobacco: Current User    Types: Chew  Substance Use Topics   Alcohol use: No    Alcohol/week: 0.0 standard drinks   Drug use: No    Current Outpatient Medications  Medication Sig Dispense Refill   acetaminophen (TYLENOL) 500 MG tablet Take 1,000 mg by mouth at bedtime as needed (pain.).     allopurinol (ZYLOPRIM) 100 MG tablet TAKE 1 TABLET EVERY DAY (Patient taking differently: Take 100 mg by mouth daily. ) 90 tablet 3   aspirin 81 MG tablet Take 81 mg by mouth daily.     atorvastatin (LIPITOR) 10 MG tablet TAKE 1 TABLET EVERY DAY (Patient taking differently: Take 10 mg by mouth every evening. ) 90 tablet 0   colchicine (COLCRYS) 0.6 MG tablet Take 1 tablet (0.6 mg total) by mouth 2 (two) times daily as needed. (Patient taking differently: Take 0.6 mg by mouth 2 (two) times daily as needed (gout). ) 60 tablet 0   feeding supplement, ENSURE ENLIVE, (ENSURE ENLIVE) LIQD Take 237 mLs by mouth 2 (two) times daily between meals. 237 mL 12   LORazepam (ATIVAN) 1 MG tablet Takes 1/2 to 1 tablet q day prn (Patient taking differently: Take 0.5-1 mg by mouth at bedtime as needed for sleep. ) 30 tablet 2   losartan (COZAAR) 25 MG tablet Take  0.5 tablets (12.5 mg total) by mouth daily. 45 tablet 3   metoprolol succinate (TOPROL-XL) 50 MG 24 hr tablet Take 1 tablet (50 mg total) by mouth 2 (two) times a day. Take with or immediately following a meal. 30 tablet 0   torsemide (DEMADEX) 20 MG tablet Take 2 tablets (40 mg total) by mouth daily. 180 tablet 3   fluticasone (FLOVENT DISKUS) 50 MCG/BLIST diskus inhaler Inhale 2 puffs into the lungs 2 (two) times daily. (Patient not taking: Reported on 12/26/2018) 1 Inhaler 1   No current facility-administered medications for this visit.     No Known Allergies  Review of Systems  Constitutional: Positive for activity change and fatigue.       Weight loss  HENT:  Full dentures  Eyes: Negative.   Respiratory: Positive for cough and shortness of breath.   Cardiovascular: Negative for chest pain and leg swelling.  Gastrointestinal: Negative.   Endocrine: Negative.   Genitourinary: Negative.   Musculoskeletal: Positive for arthralgias.  Skin: Negative.   Allergic/Immunologic: Negative.   Neurological: Negative for dizziness, syncope and light-headedness.  Hematological: Bruises/bleeds easily.  Psychiatric/Behavioral: Negative.     BP 112/73 (BP Location: Left Arm, Patient Position: Sitting, Cuff Size: Normal)    Pulse 65    Temp (!) 97.3 F (36.3 C) Comment: thermal   Resp 16    Ht 5\' 11"  (1.803 m)    Wt 204 lb (92.5 kg)    SpO2 95% Comment: RA   BMI 28.45 kg/m  Physical Exam Constitutional:      Appearance: Normal appearance. He is normal weight.  HENT:     Head: Normocephalic and atraumatic.     Mouth/Throat:     Mouth: Mucous membranes are moist.     Pharynx: Oropharynx is clear.  Eyes:     Extraocular Movements: Extraocular movements intact.     Conjunctiva/sclera: Conjunctivae normal.     Pupils: Pupils are equal, round, and reactive to light.  Neck:     Musculoskeletal: Normal range of motion and neck supple.     Vascular: No carotid bruit.  Cardiovascular:      Rate and Rhythm: Normal rate and regular rhythm.     Pulses: Normal pulses.     Heart sounds: Murmur present.     Comments: 3/6 systolic murmur along the right sternal border.  There is no diastolic murmur Pulmonary:     Comments: Decreased breath sounds in the bases Abdominal:     General: Abdomen is flat. Bowel sounds are normal. There is no distension.     Palpations: Abdomen is soft.     Tenderness: There is no abdominal tenderness.  Musculoskeletal: Normal range of motion.        General: No swelling.  Skin:    General: Skin is warm and dry.  Neurological:     General: No focal deficit present.     Mental Status: He is alert and oriented to person, place, and time.  Psychiatric:        Mood and Affect: Mood normal.        Behavior: Behavior normal.      Diagnostic Tests:  CLINICAL DATA:  Shortness of breath  EXAM: CT ANGIOGRAPHY CHEST WITH CONTRAST  TECHNIQUE: Multidetector CT imaging of the chest was performed using the standard protocol during bolus administration of intravenous contrast. Multiplanar CT image reconstructions and MIPs were obtained to evaluate the vascular anatomy.  CONTRAST:  74mL OMNIPAQUE IOHEXOL 350 MG/ML SOLN  COMPARISON:  Chest radiograph December 11, 2018  FINDINGS: Cardiovascular: There is no demonstrable pulmonary embolus. The ascending thoracic aorta measures 4.1 x 4.1 cm in transverse diameter. No dissection is seen. Note that the contrast bolus within the aorta is not sufficient for confident exclusion of dissection is a differential consideration radiographically. There are foci calcification in the visualized great vessels. There are foci of aortic atherosclerosis. There are foci of coronary artery calcification at multiple sites. There is no pericardial effusion or pericardial thickening evident. The main pulmonary outflow tract measures 3.2 cm, prominent.  Mediastinum/Nodes: Visualized thyroid appears unremarkable.  There are multiple subcentimeter mediastinal lymph nodes. There are multiple calcified lymph nodes consistent with prior granulomatous disease. There is a prominent right hilar lymph node measuring 1.5 x  1.2 cm. A second right hilar lymph node measures 1.3 x 1.1 cm. There is a subcarinal lymph node measuring 1.3 x 1.2 cm. No esophageal lesions evident.  Lungs/Pleura: There are sizable free-flowing pleural effusions bilaterally with compressive atelectasis in each lung base posteriorly. Elsewhere, there is patchy atelectasis in the upper lobes with a somewhat mosaic attenuation bilaterally.  Upper Abdomen: In the visualized upper abdomen, there is aortic atherosclerosis. There are occasional calcified splenic granulomas. The gallbladder appears somewhat distended without wall thickening.  Musculoskeletal: There is degenerative change in the thoracic and upper lumbar regions. No blastic or lytic bone lesions are evident. There is degenerative change in each shoulder. No chest wall lesions are evident.  Review of the MIP images confirms the above findings.  IMPRESSION: 1.  No demonstrable pulmonary embolus.  2. Prominence of the ascending thoracic aorta measuring 4.1 x 4.1 cm in transverse diameter. There is aortic atherosclerosis as well as foci of great vessel and coronary artery calcification. No dissection evident; the contrast bolus in the aorta is not sufficient for meaningful assessment for potential dissection. Recommend annual imaging followup by CTA or MRA. This recommendation follows 2010 ACCF/AHA/AATS/ACR/ASA/SCA/SCAI/SIR/STS/SVM Guidelines for the Diagnosis and Management of Patients with Thoracic Aortic Disease. Circulation. 2010; 121: W409-B353. Aortic aneurysm NOS (ICD10-I71.9).  3. Prominence of the main pulmonary outflow tract, an appearance indicative of a degree of pulmonary arterial hypertension.  4. Sizable free-flowing pleural effusions bilaterally  with bibasilar atelectatic change. Mosaic attenuation elsewhere raises question of underlying small airways obstructive disease.  5. Several prominent lymph nodes of uncertain etiology. Evidence of prior granulomatous disease with several calcified mediastinal lymph nodes. There is also calcification in the splenic artery consistent with prior granulomatous disease.  Aortic Atherosclerosis (ICD10-I70.0).   Electronically Signed   By: Lowella Grip III M.D.   On: 12/11/2018 11:25  ECHOCARDIOGRAM REPORT       Patient Name:   [name] [name] Date of Exam: 12/12/2018 Medical Rec #:  [id number]         Height:       70.0 in Accession #:    [id number]        Weight:       215.4 lb Date of Birth:  10/19/1930         BSA:          2.15 m Patient Age:    3 years          BP:           123/63 mmHg Patient Gender: M                 HR:           64 bpm. Exam Location:  [facility]    Procedure: 2D Echo, Cardiac Doppler and Color Doppler  Indications:     Dyspnea 786.09   History:         Patient has no prior history of Echocardiogram examinations.                  Signs/Symptoms: Murmur Risk Factors: Hypertension.                  Hypercholesterolemia.   Sonographer:     Sherrie Sport RDCS (AE) Referring Phys:  [id number] Loletha Grayer Diagnosing Phys: Nelva Bush MD  IMPRESSIONS    1. Severe hypokinesis of the left ventricular, entire anterior wall, anterolateral wall and apical segment.  2. There is dyskinesis of the left ventricular, basal  inferior wall.  3. The left ventricle has moderate-severely reduced systolic function, with an ejection fraction of 30-35%. The cavity size was normal. There is mildly increased left ventricular wall thickness. Left ventricular diastolic Doppler parameters are  consistent with restrictive filling. Elevated mean left atrial pressure.  4. Left ventricular apical thrombus cannot be excluded. Consider limited echo with Definity or  cardiac MRI for further evaluation, as clinically indicated.  5. Large pleural effusion in the left lateral region.  6. The mitral valve is degenerative. Mild thickening of the mitral valve leaflet. Mitral valve regurgitation is moderate by color flow Doppler.  7. The tricuspid valve is grossly normal.  8. The aortic valve has an indeterminate number of cusps. Severely thickening of the aortic valve. Moderate calcification of the aortic valve. Aortic valve regurgitation is moderate by color flow Doppler. Severe stenosis of the aortic valve.  9. The interatrial septum was not well visualized.  FINDINGS  Left Ventricle: The left ventricle has moderate-severely reduced systolic function, with an ejection fraction of 30-35%. The cavity size was normal. There is mildly increased left ventricular wall thickness. Left ventricular diastolic Doppler parameters  are consistent with restrictive filling. Elevated mean left atrial pressure Severe hypokinesis of the left ventricular, entire anterior wall, anterolateral wall and apical segment. There is dyskinesis of the left ventricular, basal inferior wall. Left  ventricular apical thrombus cannot be excluded. Consider limited echo with Definity or cardiac MRI for further evaluation, as clinically indicated.  Right Ventricle: The right ventricle has low normal systolic function. The cavity was normal. There is no increase in right ventricular wall thickness. Right ventricular systolic pressure is severely elevated (65-70 mmHg plus central venous pressure).  Left Atrium: Left atrial size was normal in size.  Right Atrium: Right atrial size was normal in size.  Interatrial Septum: The interatrial septum was not well visualized.  Pericardium: The pericardium was not well visualized. There is a large pleural effusion in the left lateral region.  Mitral Valve: The mitral valve is degenerative in appearance. Mild thickening of the mitral valve leaflet.  Mitral valve regurgitation is moderate by color flow Doppler.  Tricuspid Valve: The tricuspid valve is grossly normal. Tricuspid valve regurgitation is mild by color flow Doppler.  Aortic Valve: The aortic valve has an indeterminate number of cusps Severely thickening of the aortic valve, with severely restricted right coronary cusp excursion. Moderate calcification of the aortic valve. Aortic valve regurgitation is moderate by  color flow Doppler. There is Severe stenosis of the aortic valve, with a calculated valve area of 0.58 cm.  Pulmonic Valve: The pulmonic valve was not well visualized. Pulmonic valve regurgitation is mild by color flow Doppler. No evidence of pulmonic stenosis.  Aorta: The aorta was not well visualized.    Pulmonary Artery: The pulmonary artery is not well seen.  Venous: The inferior vena cava was not well visualized.    +--------------+--------++  LEFT VENTRICLE                 +----------------+---------++ +--------------+--------++       Diastology                    PLAX 2D                        +----------------+---------++ +--------------+--------++       LV e' lateral:   7.07 cm/s    LVIDd:         5.35 cm         +----------------+---------++ +--------------+--------++  LV E/e' lateral: 14.1         LVIDs:         4.49 cm         +----------------+---------++ +--------------+--------++       LV e' medial:    4.03 cm/s    LV PW:         1.37 cm         +----------------+---------++ +--------------+--------++       LV E/e' medial:  24.8         LV IVS:        1.01 cm         +----------------+---------++ +--------------+--------++  LVOT diam:     2.00 cm    +--------------+--------++  LV SV:         46 ml      +--------------+--------++  LV SV Index:   20.84      +--------------+--------++  LVOT Area:     3.14 cm   +--------------+--------++                            +--------------+--------++    +------------------+---------++  LV Volumes (MOD)               +------------------+---------++  LV area d, A4C:    40.40 cm   +------------------+---------++  LV area s, A4C:    29.50 cm   +------------------+---------++  LV major d, A4C:   8.64 cm     +------------------+---------++  LV major s, A4C:   8.01 cm     +------------------+---------++  LV vol d, MOD A4C: 149.0 ml    +------------------+---------++  LV vol s, MOD A4C: 88.8 ml     +------------------+---------++  LV SV MOD A4C:     149.0 ml    +------------------+---------++  +---------------+----------++  RIGHT VENTRICLE              +---------------+----------++  RV Basal diam:  4.13 cm      +---------------+----------++  RV S prime:     11.30 cm/s   +---------------+----------++  TAPSE (M-mode): 3.7 cm       +---------------+----------++  +-------------+-------++-----------++  LEFT ATRIUM            Index         +-------------+-------++-----------++  LA diam:      5.10 cm  2.37 cm/m    +-------------+-------++-----------++  LA Vol (A2C): 70.3 ml  32.63 ml/m   +-------------+-------++-----------++  LA Vol (A4C): 42.3 ml  19.64 ml/m   +-------------+-------++-----------++ +------------+---------++-----------++  RIGHT ATRIUM            Index         +------------+---------++-----------++  RA Area:     12.20 cm                +------------+---------++-----------++  RA Volume:   25.40 ml   11.79 ml/m   +------------+---------++-----------++  +------------------+------------++ +---------------+--------++  AORTIC VALVE                       PULMONIC VALVE             +------------------+------------++ +---------------+--------++  AV Area (Vmax):    0.57 cm        PV Vmax:        0.91 m/s   +------------------+------------++ +---------------+--------++  AV Area (Vmean):   0.54 cm        PV Peak grad:   3.3 mmHg   +------------------+------------++ +---------------+--------++  AV Area (VTI):      0.58 cm        RVOT Peak grad: 5 mmHg     +------------------+------------++ +---------------+--------++  AV Vmax:           389.00 cm/s    +------------------+------------++  AV Vmean:          297.000 cm/s   +------------------+------------++  AV VTI:            1.011 m        +------------------+------------++  AV Peak Grad:      60.5 mmHg      +------------------+------------++  AV Mean Grad:      42.0 mmHg      +------------------+------------++  LVOT Vmax:         70.90 cm/s     +------------------+------------++  LVOT Vmean:        51.100 cm/s    +------------------+------------++  LVOT VTI:          0.187 m        +------------------+------------++  LVOT/AV VTI ratio: 0.18           +------------------+------------++  AR PHT:            401 msec       +------------------+------------++   +-------------+-------++  AORTA                   +-------------+-------++  Ao Root diam: 3.20 cm   +-------------+-------++  +--------------+----------++  +---------------+-----------++  MITRAL VALVE                  TRICUSPID VALVE               +--------------+----------++  +---------------+-----------++  MV Area (PHT): 4.96 cm       TR Peak grad:   66.1 mmHg     +--------------+----------++  +---------------+-----------++  MV PHT:        44.37 msec     TR Vmax:        413.00 cm/s   +--------------+----------++  +---------------+-----------++  MV Decel Time: 153 msec     +--------------+----------++  +--------------+-------+ +--------------+-----------++  SHUNTS                   MV E velocity: 100.00 cm/s   +--------------+-------+ +--------------+-----------++  Systemic VTI:  0.19 m    MV A velocity: 47.10 cm/s    +--------------+-------+ +--------------+-----------++  Systemic Diam: 2.00 cm   MV E/A ratio:  2.12          +--------------+-------+ +--------------+-----------++    Nelva Bush MD Electronically signed by Nelva Bush MD Signature Date/Time:  12/12/2018/1:29:42 PM    End, Harrell Gave, MD (Primary)  Rise Mu, PA-C  Procedures  RIGHT HEART CATH AND CORONARY ANGIOGRAPHY  Conclusion  Conclusions: 1. Significant two-vessel coronary artery disease, including 80% ostial LAD stenosis and 80-90% stenosis of large RCA continuation. 2. Mild to moderate disease noted the proximal/mid LAD, RCA, and ramus intermedius. 3. Mildly elevated right heart filling pressures. 4. Moderately elevated left heart filling pressures. 5. Severe pulmonary hypertension. 6. Moderately reduced Fick cardiac output/index.  Recommendations: 1. Outpatient cardiac surgery consultation, given significant two-vessel coronary artery disease (LAD stenosis not ideal for PCI) and severe aortic stenosis. 2. Continue current dose of torsemide; will plan for BMP early next week and consider escalation of torsemide +/- addition of low-dose ACEI/ARB. 3. Continue current dose of metoprolol. 4. Aggressive secondary prevention of coronary artery disease. 5. Follow-up in the office in two  weeks; may need to consider referral to advanced heart failure clinic in [place] if the patient has recurrent heart failure symptoms or he does not tolerate escalation of diuresis/evidence-based heart failure therapy.  Nelva Bush, MD [facility] Pager: [phone]   Recommendations  Antiplatelet/Anticoag Indefinite aspirin 81 mg daily pending cardiac surgery evaluation.  Discharge Date In the absence of any other complications or medical issues, we expect the patient to be ready for discharge from a cath perspective on 01/02/2019.  Indications  Acute systolic CHF (congestive heart failure) (HCC) [I50.21 (ICD-10-CM)]  Severe aortic stenosis [I35.0 (ICD-10-CM)]  Procedural Details  Technical Details Indication: 85 y.o. year-old man with history of hypertension, hyperlipidemia, chronic kidney disease stage III, asthma, arthritis, gout, and BPH, presenting for  evaluation of recently diagnosed acute systolic heart failure (LVEF 30-35%) and severe aortic stenosis.  GFR: 47 ml/min  Procedure: The risks, benefits, complications, treatment options, and expected outcomes were discussed with the patient. The patient and/or family concurred with the proposed plan, giving informed consent. The patient was brought to the cath lab after IV hydration was begun and oral premedication was given. The patient was further sedated with Versed and Fentanyl. The right wrist was assessed with a modified Allens test which was normal. The right wrist and elbow were prepped and draped in a sterile fashion. 1% lidocaine was used for local anesthesia. A previously placed antecubital vein IV was exchanged for a 70F slender Glidesheath using modified Seldinger technique. Right heart catheterization was performed by advancing a 70F balloon-tipped catheter through the right heart chambers into the pulmonary capillary wedge position. Pressure measurements and oxygen saturations were obtained.  Using the modified Seldinger access technique, a 22F slender Glidesheath was placed in the right radial artery. 3 mg Verapamil was given through the sheath. Heparin 4,500 units were administered. Selective coronary angiography was performed using 70F TIG4.0 catheter to engage the left and right coronary arteries. Left heart catheterization was not performed given known severe aortic stenosis.  At the end of the procedure, the radial artery sheath was removed and a TR band applied to achieve patent hemostasis. There were no immediate complications. The patient was taken to the recovery area in stable condition.  Contrast used: 70 mL Fluoroscopy time: 6.0 min Radiation dose: 551 mGy  Estimated blood loss <50 mL.   During this procedure medications were administered to achieve and maintain moderate conscious sedation while the patient's heart rate, blood pressure, and oxygen saturation were continuously  monitored and I was present face-to-face 100% of this time.  Medications (Filter: Administrations occurring from 01/02/19 0835 to 01/02/19 0940) (important)  Continuous medications are totaled by the amount administered until 01/02/19 0940.  Medication Rate/Dose/Volume Action  Date Time   midazolam (VERSED) injection (mg) 0.5 mg Given 01/02/19 0842   Total dose as of 01/02/19 0940        0.5 mg        fentaNYL (SUBLIMAZE) injection (mcg) 12.5 mcg Given 01/02/19 0842   Total dose as of 01/02/19 0940        12.5 mcg        verapamil (ISOPTIN) injection (mg) 2.5 mg Given 01/02/19 0859   Total dose as of 01/02/19 0940        2.5 mg        heparin injection (Units) 4,500 Units Given 01/02/19 0901   Total dose as of 01/02/19 0940        4,500 Units  iohexol (OMNIPAQUE) 300 MG/ML solution (mL) 70 mL Given 01/02/19 0921   Total dose as of 01/02/19 0940        70 mL        Heparin (Porcine) in NaCl 1000-0.9 UT/500ML-% SOLN (mL) 500 mL Given 01/02/19 0922   Total dose as of 01/02/19 0940        500 mL        Sedation Time  Sedation Time Physician-1: 35 minutes 19 seconds  Complications  Complications documented before study signed (01/03/2019 6:64 AM)   No complications were associated with this study.  Documented by Nelva Bush, MD - 01/02/2019 9:56 AM    Coronary Findings  Diagnostic Dominance: Right Left Main  Vessel is large. Vessel is angiographically normal.  Left Anterior Descending  Vessel is large. There is moderate diffuse disease throughout the vessel.  Ost LAD lesion 80% stenosed  Ost LAD lesion is 80% stenosed. The lesion is eccentric. The lesion is moderately calcified.  First Diagonal Branch  Vessel is small in size.  Second Diagonal Branch  Vessel is small in size.  Third Diagonal Branch  Vessel is small in size.  Ramus Intermedius  Vessel is moderate in size.  Ramus lesion 50% stenosed  Ramus lesion is 50% stenosed.  Left Circumflex  Vessel is  moderate in size.  First Obtuse Marginal Branch  Vessel is small in size.  Second Obtuse Marginal Branch  Vessel is small in size.  Right Coronary Artery  Vessel is large. There is mild diffuse disease throughout the vessel.  Right Posterior Descending Artery  Vessel is moderate in size.  RPDA lesion 50% stenosed  RPDA lesion is 50% stenosed.  Right Posterior Atrioventricular Artery  Vessel is large in size.  RPAV lesion 85% stenosed  RPAV lesion is 85% stenosed. The lesion is irregular.  Intervention  No interventions have been documented. Right Heart  Right Heart Pressures RA (mean): 8 mmHg RV (S/EDP): 84/11 mmHg PA (S/D, mean): 84/35 (55) mmHg PCWP (mean): 31 mmHg  Ao sat: 97% PA sat: 62%  Fick CO: 4.0 L/min Fick CI: 1.9 L/min/m^2  PVR: 6 Wood units  Coronary Diagrams  Diagnostic Dominance: Right  Intervention  Implants   No implant documentation for this case.  Syngo Images  Show images for CARDIAC CATHETERIZATION  Images on Long Term Storage  Show images for Duel, Conrad to Procedure Log  Procedure Log    Hemo Data (last day) before discharge   AO Systolic Cath Pressure  AO Diastolic Cath Pressure  AO Mean Cath Pressure  PA Systolic Cath Pressure  PA Diastolic Cath Pressure  PA Mean Cath Pressure  RA Wedge A Wave  RA Wedge V Wave  RV Systolic Cath Pressure  RV Diastolic Cath Pressure  RV End Diastolic  PCW A Wave  PCW V Wave  PCW Mean  AO O2 Sat  PA O2 Sat  AO O2 Sat  Fick C.O.  Fick C.I.   --  --  --  --  --  --  11 mmHg  10 mmHg  --  --  --  32 mmHg  32 mmHg  31 mmHg  --  --  --  4.01 L/min  1.87 L/min/m2   --  --  --  --  --  --  --  --  84 mmHg  7 mmHg  11 mmHg  --  --  --  --  --  --  --  --   --  --  --  84 mmHg  35 mmHg  55 mmHg  --  --  --  --  --  --  --  --  --  --  --  --  --   123  54 mmHg  73 mmHg  --  --  --  --  --  --  --  --  --  --  --  --  --  --  --  --   --  --  --  --  --  --  --  --  --  --  --  --  --  --  97.3 %   --  SA  --  --   --  --  --  --  --  --  --  --  --  --  --  --  --  --  --  MV  --  --  --   114  53 mmHg  80 mmHg  --  --  --  --  --  --  --  --  --  --  --  --  --  --  --  --   122  56 mmHg  84 mmHg  --  --  --  --                             Impression:  This 85 year old gentleman has stage D, severe, symptomatic aortic stenosis as well as severe multivessel coronary disease with moderate aortic insufficiency and moderate to severe left ventricular systolic dysfunction with ejection fraction of 30 to 35%.  I suspect his reduced left ventricular systolic function is due to a combination of ischemia and valvular heart disease.  He presented in June 2020 with New York Heart Association class IV symptoms of congestive heart failure and has improved with diuresis.  I agree that coronary bypass graft surgery and aortic valve replacement are indicated in this patient.  His high-grade ostial LAD stenosis is not felt to be amenable to PCI.  His operative risk is increased due to his advanced age and moderate to severe left ventricular systolic dysfunction but he is still very functional and surgical treatment is the only good option for him.  He does have moderate mitral regurgitation on echocardiogram and hopefully this will improve with revascularization and aortic valve replacement.  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  I discussed the operative procedure with the patient and family including alternatives, benefits and risks; including but not limited to bleeding, blood transfusion, infection, stroke, myocardial infarction, graft failure, heart block requiring a permanent pacemaker, organ dysfunction, and death.  [name] [name] understands and agrees to proceed.    Plan:  He will be scheduled for coronary artery bypass graft surgery and aortic valve replacement on Thursday, January 25, 2019.  I spent 60 minutes  performing this consultation and > 50% of this time was spent face to face counseling and coordinating the care of this patient's severe symptomatic aortic stenosis and severe multivessel coronary disease.   Gaye Pollack, MD [facility] [phone]

## 2019-01-15 NOTE — Telephone Encounter (Signed)
Call to patient to discuss labs needed post visit.   Pt reports that he is having open heart surgery 8/6 and will be having labs 2 days prior. Pt wanting to defer labs to that point. I told patient, in my opinion he should go ahead and get labs to follow up from med changes at last OV. He has a hx of elevated creatinine. I told patient it would be best to get the labs so we can ensure that he will be appropriate for surgery.   Pt felt uneasy and said he would think about it. He did confirm location and orders needed.   I spoke with Christell Faith, PA and he confirmed plan.

## 2019-01-15 NOTE — Telephone Encounter (Signed)
Please call to discuss his labwork. Patient states he has been scheduled for "heart surgery"

## 2019-01-16 ENCOUNTER — Other Ambulatory Visit: Payer: Self-pay | Admitting: Internal Medicine

## 2019-01-22 NOTE — Pre-Procedure Instructions (Signed)
BAYDEN GIL  01/22/2019      [id number] - [place], [place] - [address] [place] [address] Phone: [phone] Fax: [phone]  [facility] - [place], [place] [address] Phone: [phone] Fax: [phone]    Your procedure is scheduled on 01/25/19.  Report to [facility] Admitting at 530 A.M.  Call this number if you have problems the morning of surgery:  [phone]   Remember:  Do not eat or drink after midnight.      Take these medicines the morning of surgery with A SIP OF WATER ----METOPROLOL,ALL INHALERS,TYLENOL,COLCHICINE    Do not wear jewelry, make-up or nail polish.  Do not wear lotions, powders, or perfumes, or deodorant.  Do not shave 48 hours prior to surgery.  Men may shave face and neck.  Do not bring valuables to the hospital.  [facility] is not responsible for any belongings or valuables.  Contacts, dentures or bridgework may not be worn into surgery.  Leave your suitcase in the car.  After surgery it may be brought to your room.  For patients admitted to the hospital, discharge time will be determined by your treatment team.  Patients discharged the day of surgery will not be allowed to drive home.   Special instructions:  --Do not take any aspirin,anti-inflammatories,vitamins,or herbal supplements 5-7 days prior to surgery.[place] - Preparing for Surgery  Before surgery, you can play an important role.  Because skin is not sterile, your skin needs to be as free of germs as possible.  You can reduce the number of germs on you skin by washing with CHG (chlorahexidine gluconate) soap before surgery.  CHG is an antiseptic cleaner which kills germs and bonds with the skin to continue killing germs even after washing.  Oral Hygiene is also important in reducing the risk of infection.  Remember to brush your teeth with your regular  toothpaste the morning of surgery.  Please DO NOT use if you have an allergy to CHG or antibacterial soaps.  If your skin becomes reddened/irritated stop using the CHG and inform your nurse when you arrive at Short Stay.  Do not shave (including legs and underarms) for at least 48 hours prior to the first CHG shower.  You may shave your face.  Please follow these instructions carefully:   1.  Shower with CHG Soap the night before surgery and the morning of Surgery.  2.  If you choose to wash your hair, wash your hair first as usual with your normal shampoo.  3.  After you shampoo, rinse your hair and body thoroughly to remove the shampoo. 4.  Use CHG as you would any other liquid soap.  You can apply chg directly to the skin and wash gently with a      scrungie or washcloth.           5.  Apply the CHG Soap to your body ONLY FROM THE NECK DOWN.   Do not use on open wounds or open sores. Avoid contact with your eyes, ears, mouth and genitals (private parts).  Wash genitals (private parts) with your normal soap.  6.  Wash thoroughly, paying special attention to the area where your surgery will be performed.  7.  Thoroughly rinse your body with warm water from the neck down.  8.  DO NOT shower/wash with your normal  soap after using and rinsing off the CHG Soap.  9.  Pat yourself dry with a clean towel.            10.  Wear clean pajamas.            11.  Place clean sheets on your bed the night of your first shower and do not sleep with pets.  Day of Surgery  Do not apply any lotions/deoderants the morning of surgery.   Please wear clean clothes to the hospital/surgery center. Remember to brush your teeth with toothpaste.    Please read over the following fact sheets that you were given. MRSA Information

## 2019-01-23 ENCOUNTER — Encounter (HOSPITAL_COMMUNITY): Payer: Self-pay

## 2019-01-23 ENCOUNTER — Encounter (HOSPITAL_COMMUNITY)
Admission: RE | Admit: 2019-01-23 | Discharge: 2019-01-23 | Disposition: A | Discharge status: Home / Self Care | Payer: Medicare HMO | Source: Ambulatory Visit | Attending: Surgery | Admitting: Surgery

## 2019-01-23 ENCOUNTER — Ambulatory Visit (HOSPITAL_BASED_OUTPATIENT_CLINIC_OR_DEPARTMENT_OTHER)
Admission: RE | Admit: 2019-01-23 | Discharge: 2019-01-23 | Disposition: A | Discharge status: Home / Self Care | Payer: Medicare HMO | Source: Ambulatory Visit | Attending: Surgery | Admitting: Surgery

## 2019-01-23 ENCOUNTER — Other Ambulatory Visit: Payer: Self-pay

## 2019-01-23 ENCOUNTER — Other Ambulatory Visit (HOSPITAL_COMMUNITY)
Admission: RE | Admit: 2019-01-23 | Discharge: 2019-01-23 | Disposition: A | Discharge status: Home / Self Care | Payer: Medicare HMO | Source: Ambulatory Visit | Attending: Surgery | Admitting: Surgery

## 2019-01-23 DIAGNOSIS — D62 Acute posthemorrhagic anemia: Secondary | ICD-10-CM | POA: Diagnosis not present

## 2019-01-23 DIAGNOSIS — R2689 Other abnormalities of gait and mobility: Secondary | ICD-10-CM | POA: Diagnosis not present

## 2019-01-23 DIAGNOSIS — I358 Other nonrheumatic aortic valve disorders: Secondary | ICD-10-CM | POA: Diagnosis not present

## 2019-01-23 DIAGNOSIS — Z8249 Family history of ischemic heart disease and other diseases of the circulatory system: Secondary | ICD-10-CM | POA: Diagnosis not present

## 2019-01-23 DIAGNOSIS — M109 Gout, unspecified: Secondary | ICD-10-CM | POA: Diagnosis present

## 2019-01-23 DIAGNOSIS — Z7982 Long term (current) use of aspirin: Secondary | ICD-10-CM | POA: Diagnosis not present

## 2019-01-23 DIAGNOSIS — I110 Hypertensive heart disease with heart failure: Secondary | ICD-10-CM | POA: Diagnosis not present

## 2019-01-23 DIAGNOSIS — I2721 Secondary pulmonary arterial hypertension: Secondary | ICD-10-CM | POA: Diagnosis present

## 2019-01-23 DIAGNOSIS — Z4682 Encounter for fitting and adjustment of non-vascular catheter: Secondary | ICD-10-CM | POA: Diagnosis not present

## 2019-01-23 DIAGNOSIS — E119 Type 2 diabetes mellitus without complications: Secondary | ICD-10-CM | POA: Diagnosis not present

## 2019-01-23 DIAGNOSIS — I088 Other rheumatic multiple valve diseases: Secondary | ICD-10-CM | POA: Diagnosis not present

## 2019-01-23 DIAGNOSIS — E785 Hyperlipidemia, unspecified: Secondary | ICD-10-CM | POA: Diagnosis not present

## 2019-01-23 DIAGNOSIS — Z79899 Other long term (current) drug therapy: Secondary | ICD-10-CM | POA: Diagnosis not present

## 2019-01-23 DIAGNOSIS — I350 Nonrheumatic aortic (valve) stenosis: Secondary | ICD-10-CM

## 2019-01-23 DIAGNOSIS — I2510 Atherosclerotic heart disease of native coronary artery without angina pectoris: Secondary | ICD-10-CM

## 2019-01-23 DIAGNOSIS — J90 Pleural effusion, not elsewhere classified: Secondary | ICD-10-CM | POA: Insufficient documentation

## 2019-01-23 DIAGNOSIS — I083 Combined rheumatic disorders of mitral, aortic and tricuspid valves: Secondary | ICD-10-CM | POA: Diagnosis not present

## 2019-01-23 DIAGNOSIS — Z1159 Encounter for screening for other viral diseases: Secondary | ICD-10-CM | POA: Diagnosis not present

## 2019-01-23 DIAGNOSIS — Z803 Family history of malignant neoplasm of breast: Secondary | ICD-10-CM | POA: Diagnosis not present

## 2019-01-23 DIAGNOSIS — F17220 Nicotine dependence, chewing tobacco, uncomplicated: Secondary | ICD-10-CM | POA: Diagnosis present

## 2019-01-23 DIAGNOSIS — Z01818 Encounter for other preprocedural examination: Secondary | ICD-10-CM | POA: Insufficient documentation

## 2019-01-23 DIAGNOSIS — J9811 Atelectasis: Secondary | ICD-10-CM | POA: Diagnosis not present

## 2019-01-23 DIAGNOSIS — Z7951 Long term (current) use of inhaled steroids: Secondary | ICD-10-CM | POA: Diagnosis not present

## 2019-01-23 DIAGNOSIS — I6523 Occlusion and stenosis of bilateral carotid arteries: Secondary | ICD-10-CM | POA: Insufficient documentation

## 2019-01-23 DIAGNOSIS — I5023 Acute on chronic systolic (congestive) heart failure: Secondary | ICD-10-CM | POA: Diagnosis not present

## 2019-01-23 DIAGNOSIS — I10 Essential (primary) hypertension: Secondary | ICD-10-CM | POA: Diagnosis not present

## 2019-01-23 DIAGNOSIS — Z806 Family history of leukemia: Secondary | ICD-10-CM | POA: Diagnosis not present

## 2019-01-23 DIAGNOSIS — E7800 Pure hypercholesterolemia, unspecified: Secondary | ICD-10-CM | POA: Diagnosis present

## 2019-01-23 DIAGNOSIS — N400 Enlarged prostate without lower urinary tract symptoms: Secondary | ICD-10-CM | POA: Diagnosis present

## 2019-01-23 DIAGNOSIS — Z20828 Contact with and (suspected) exposure to other viral communicable diseases: Secondary | ICD-10-CM | POA: Insufficient documentation

## 2019-01-23 DIAGNOSIS — I77810 Thoracic aortic ectasia: Secondary | ICD-10-CM | POA: Diagnosis present

## 2019-01-23 DIAGNOSIS — I700 Atherosclerosis of aorta: Secondary | ICD-10-CM | POA: Diagnosis not present

## 2019-01-23 DIAGNOSIS — D696 Thrombocytopenia, unspecified: Secondary | ICD-10-CM | POA: Diagnosis not present

## 2019-01-23 HISTORY — DX: Nonrheumatic aortic (valve) stenosis: I35.0

## 2019-01-23 LAB — SURGICAL PCR SCREEN
MRSA, PCR: NEGATIVE
Staphylococcus aureus: POSITIVE — AB

## 2019-01-23 LAB — CBC
HCT: 42.9 % (ref 39.0–52.0)
Hemoglobin: 14.1 g/dL (ref 13.0–17.0)
MCH: 33.5 pg (ref 26.0–34.0)
MCHC: 32.9 g/dL (ref 30.0–36.0)
MCV: 101.9 fL — ABNORMAL HIGH (ref 80.0–100.0)
Platelets: 166 10*3/uL (ref 150–400)
RBC: 4.21 MIL/uL — ABNORMAL LOW (ref 4.22–5.81)
RDW: 13.2 % (ref 11.5–15.5)
WBC: 9.6 10*3/uL (ref 4.0–10.5)
nRBC: 0 % (ref 0.0–0.2)

## 2019-01-23 LAB — URINALYSIS, ROUTINE W REFLEX MICROSCOPIC
Bilirubin Urine: NEGATIVE
Glucose, UA: NEGATIVE mg/dL
Hgb urine dipstick: NEGATIVE
Ketones, ur: NEGATIVE mg/dL
Leukocytes,Ua: NEGATIVE
Nitrite: NEGATIVE
Protein, ur: NEGATIVE mg/dL
Specific Gravity, Urine: 1.01 (ref 1.005–1.030)
pH: 5 (ref 5.0–8.0)

## 2019-01-23 LAB — COMPREHENSIVE METABOLIC PANEL
ALT: 12 U/L (ref 0–44)
AST: 26 U/L (ref 15–41)
Albumin: 4.3 g/dL (ref 3.5–5.0)
Alkaline Phosphatase: 63 U/L (ref 38–126)
Anion gap: 15 (ref 5–15)
BUN: 48 mg/dL — ABNORMAL HIGH (ref 8–23)
CO2: 18 mmol/L — ABNORMAL LOW (ref 22–32)
Calcium: 9.7 mg/dL (ref 8.9–10.3)
Chloride: 104 mmol/L (ref 98–111)
Creatinine, Ser: 1.5 mg/dL — ABNORMAL HIGH (ref 0.61–1.24)
GFR calc Af Amer: 48 mL/min — ABNORMAL LOW (ref >60)
GFR calc non Af Amer: 42 mL/min — ABNORMAL LOW (ref >60)
Glucose, Bld: 121 mg/dL — ABNORMAL HIGH (ref 70–99)
Potassium: 3.8 mmol/L (ref 3.5–5.1)
Sodium: 137 mmol/L (ref 135–145)
Total Bilirubin: 0.7 mg/dL (ref 0.3–1.2)
Total Protein: 6.4 g/dL — ABNORMAL LOW (ref 6.5–8.1)

## 2019-01-23 LAB — BLOOD GAS, ARTERIAL
Acid-Base Excess: 0.4 mmol/L (ref 0.0–2.0)
Bicarbonate: 23.6 mmol/L (ref 20.0–28.0)
Drawn by: 42783
FIO2: 0.21
O2 Saturation: 98.7 %
Patient temperature: 98.6
pCO2 arterial: 32.5 mmHg (ref 32.0–48.0)
pH, Arterial: 7.474 — ABNORMAL HIGH (ref 7.350–7.450)
pO2, Arterial: 125 mmHg — ABNORMAL HIGH (ref 83.0–108.0)

## 2019-01-23 LAB — APTT: aPTT: 48 seconds — ABNORMAL HIGH (ref 24–36)

## 2019-01-23 LAB — HEMOGLOBIN A1C
Hgb A1c MFr Bld: 5.9 % — ABNORMAL HIGH (ref 4.8–5.6)
Mean Plasma Glucose: 122.63 mg/dL

## 2019-01-23 LAB — ABO/RH: ABO/RH(D): O POS

## 2019-01-23 LAB — SARS CORONAVIRUS 2 (TAT 6-24 HRS): SARS Coronavirus 2: NEGATIVE

## 2019-01-23 LAB — PROTIME-INR
INR: 1.1 (ref 0.8–1.2)
Prothrombin Time: 13.9 seconds (ref 11.4–15.2)

## 2019-01-23 NOTE — Progress Notes (Signed)
PCP - Dr Cardiologist - Dr End in [facility]  Chest x-ray - 01/23/19 EKG - 7/20 Stress Test - na ECHO - 6/20 Cardiac Cath - 7/20   C ay  Aspirin Instructions: conti  Anesthesia review: heart hx  Patient denies shortness of breath, fever, cough and chest pain at PAT appointment   Patient verbalized understanding of instructions that were given to them at the PAT appointment. Patient was also instructed that they will need to review over the PAT instructions again at home before surgery.

## 2019-01-23 NOTE — Progress Notes (Signed)
Pre operative AVR.CABG Dopplers completed. Preliminary results in Chart review CV Proc. [facility], [place] 01/23/2019, 11:40 PM

## 2019-01-24 ENCOUNTER — Telehealth: Payer: Self-pay | Admitting: *Deleted

## 2019-01-24 MED ORDER — DEXMEDETOMIDINE HCL IN NACL 400 MCG/100ML IV SOLN
0.1000 ug/kg/h | INTRAVENOUS | Status: AC
  Administered 2019-01-25: 11:00:00 .3 ug/kg/h via INTRAVENOUS
  Filled 2019-01-24: qty 100

## 2019-01-24 MED ORDER — NITROGLYCERIN IN D5W 200-5 MCG/ML-% IV SOLN
2.0000 ug/min | INTRAVENOUS | Status: DC
  Filled 2019-01-24: qty 250

## 2019-01-24 MED ORDER — POTASSIUM CHLORIDE 2 MEQ/ML IV SOLN
80.0000 meq | INTRAVENOUS | Status: DC
  Filled 2019-01-24: qty 40

## 2019-01-24 MED ORDER — SODIUM CHLORIDE 0.9 % IV SOLN
INTRAVENOUS | Status: DC
  Filled 2019-01-24: qty 30

## 2019-01-24 MED ORDER — TRANEXAMIC ACID (OHS) BOLUS VIA INFUSION
15.0000 mg/kg | INTRAVENOUS | Status: AC
  Administered 2019-01-25: 1380 mg via INTRAVENOUS
  Filled 2019-01-24: qty 1380

## 2019-01-24 MED ORDER — INSULIN REGULAR(HUMAN) IN NACL 100-0.9 UT/100ML-% IV SOLN
INTRAVENOUS | Status: AC
  Administered 2019-01-25: 08:00:00 1.2 [IU]/h via INTRAVENOUS
  Filled 2019-01-24: qty 100

## 2019-01-24 MED ORDER — PLASMA-LYTE 148 IV SOLN
INTRAVENOUS | Status: DC
  Filled 2019-01-24: qty 2.5

## 2019-01-24 MED ORDER — TRANEXAMIC ACID (OHS) PUMP PRIME SOLUTION
2.0000 mg/kg | INTRAVENOUS | Status: DC
  Filled 2019-01-24: qty 1.84

## 2019-01-24 MED ORDER — SODIUM CHLORIDE 0.9 % IV SOLN
750.0000 mg | INTRAVENOUS | Status: AC
  Administered 2019-01-25: 13:00:00 750 mg via INTRAVENOUS
  Filled 2019-01-24: qty 750

## 2019-01-24 MED ORDER — TRANEXAMIC ACID 1000 MG/10ML IV SOLN
1.5000 mg/kg/h | INTRAVENOUS | Status: AC
  Administered 2019-01-25: 09:00:00 1.5 mg/kg/h via INTRAVENOUS
  Filled 2019-01-24: qty 25

## 2019-01-24 MED ORDER — SODIUM CHLORIDE 0.9 % IV SOLN
1.5000 g | INTRAVENOUS | Status: AC
  Administered 2019-01-25: 08:00:00 1.5 g via INTRAVENOUS
  Filled 2019-01-24: qty 1.5

## 2019-01-24 MED ORDER — DOPAMINE-DEXTROSE 3.2-5 MG/ML-% IV SOLN
0.0000 ug/kg/min | INTRAVENOUS | Status: AC
  Administered 2019-01-25: 3 ug/kg/min via INTRAVENOUS
  Filled 2019-01-24: qty 250

## 2019-01-24 MED ORDER — MILRINONE LACTATE IN DEXTROSE 20-5 MG/100ML-% IV SOLN
0.3000 ug/kg/min | INTRAVENOUS | Status: AC
  Administered 2019-01-25: 08:00:00 0.375 ug/kg/min via INTRAVENOUS
  Filled 2019-01-24: qty 100

## 2019-01-24 MED ORDER — VANCOMYCIN HCL 10 G IV SOLR
1500.0000 mg | INTRAVENOUS | Status: AC
  Administered 2019-01-25: 07:00:00 1500 mg via INTRAVENOUS
  Filled 2019-01-24: qty 1500

## 2019-01-24 MED ORDER — EPINEPHRINE PF 1 MG/ML IJ SOLN
0.0000 ug/min | INTRAVENOUS | Status: DC
  Filled 2019-01-24: qty 4

## 2019-01-24 MED ORDER — PHENYLEPHRINE HCL-NACL 20-0.9 MG/250ML-% IV SOLN
30.0000 ug/min | INTRAVENOUS | Status: AC
  Administered 2019-01-25: 08:00:00 25 ug/min via INTRAVENOUS
  Filled 2019-01-24: qty 250

## 2019-01-24 MED ORDER — MAGNESIUM SULFATE 50 % IJ SOLN
40.0000 meq | INTRAMUSCULAR | Status: DC
  Filled 2019-01-24: qty 9.85

## 2019-01-24 NOTE — H&P (Signed)
[address]       [place] [address]             [phone]      Cardiothoracic Surgery Consultation   PCP is Einar Pheasant, MD  Referring Provider is End, Harrell Gave, MD      Chief Complaint  Patient presents with   Aortic Stenosis       Coronary Artery Disease         HPI:  The patient is an 85 year old gentleman with history of hypertension, hyperlipidemia, and recently diagnosed heart failure with reduced ejection fraction who was admitted to [facility] on 9/62/9528 for acute systolic congestive heart failure. The patient reportedly was evaluated by critical clinic cardiology in 2015 for a heart murmur. The patient says that he was told that he had a problem with his heart valve but we do not have records of that. He apparently had a nuclear stress test that was unremarkable and after that did not follow-up with cardiology. He saw his PCP on 12/11/2018 and did not feel well. He was hypoxic with ambulation and had some tightness in his upper chest and throat. He reported an increased exertional shortness of breath for the past several weeks as well as nonproductive cough and wheezing and was sent to the hospital. CTA of the chest ruled out pulmonary embolism. It showed a 4.1 cm ascending aorta with aortic atherosclerosis and coronary calcification. There is evidence of pulmonary hypertension. There were at least moderate sized bilateral pleural effusions with bibasilar atelectatic changes. His initial BNP was 3449 with initial troponin of 0.04. He was reportedly evaluated by pulmonary medicine and a thoracentesis was recommended but the patient declined that. He had an echocardiogram done on 623 which showed severe hypokinesis of the anterior wall, anterolateral wall, and apex with dyskinesis of the basal inferior wall and an ejection fraction of 30 to 35%. There was moderate mitral regurgitation. The mean gradient across aortic valve was  42 mmHg with a valve area of 0.58 cm. There is moderate aortic insufficiency. There is low normal RV systolic function with the RV systolic pressure of 65 to 70 mmHg. He was diuresed with improvement in his symptoms and he was discharged for outpatient cardiology follow-up. He was scheduled for right and left heart catheterization which was done on 01/02/2019. This showed severe two-vessel coronary disease with at least 80% ostial LAD stenosis and 80 to 90% stenosis of the distal right coronary artery before a large posterior lateral branch. There is at least 50% proximal ramus stenosis and 50% ostial stenosis of a large PDA. Pulmonary pressure was measured at 84/35 with a mean of 55. Mean pulmonary capillary wedge pressure was 31. Cardiac index was measured at 1.87. Pulmonary saturation was 62% with arterial saturations 97%. Pulmonary vascular resistance was measured at 6 Wood units. He was scheduled for an appointment in our office couple weeks ago for consideration of coronary bypass surgery and aortic valve replacement but he canceled that appointment and said that he had some other things to take care of. He was seen back by cardiology on 01/09/2019 and reschedule an appointment with our office today.  At the present time he denies any chest pain or pressure. He does have exertional shortness of breath and fatigue especially when out in the heat. He denies any dizziness or syncope. He has had no orthopnea or PND. He denies lower extremity swelling.       Past  Medical History:  Diagnosis Date   Arthritis    right shoulder   Asthma    BPH (benign prostatic hypertrophy)    Gout    Heart murmur    History of chicken pox    Hypercholesterolemia    Hypertension    Wears dentures    full upper and lower        Past Surgical History:  Procedure Laterality Date   arm surgery     right arm fx s/p "plate insertion"   CARDIAC CATHETERIZATION     CATARACT EXTRACTION W/PHACO Right 11/17/2016    Procedure: CATARACT EXTRACTION PHACO AND INTRAOCULAR LENS PLACEMENT ([place]) Right; Surgeon: Leandrew Koyanagi, MD; Location: [facility]; Service: Ophthalmology; Laterality: Right;   DENTAL SURGERY     all teeth extracted   RIGHT HEART CATH AND CORONARY ANGIOGRAPHY N/A 01/02/2019   Procedure: RIGHT HEART CATH AND CORONARY ANGIOGRAPHY; Surgeon: Nelva Bush, MD; Location: [facility]; Service: Cardiovascular; Laterality: N/A;        Family History  Problem Relation Age of Onset   Leukemia Father    Congestive Heart Failure Mother    Cancer Daughter    Breast Cancer   Prostate cancer Neg Hx    Colon cancer Neg Hx   Social History  Social History        Tobacco Use   Smoking status: Never Smoker   Smokeless tobacco: Current User    Types: Chew  Substance Use Topics   Alcohol use: No    Alcohol/week: 0.0 standard drinks   Drug use: No         Current Outpatient Medications  Medication Sig Dispense Refill   acetaminophen (TYLENOL) 500 MG tablet Take 1,000 mg by mouth at bedtime as needed (pain.).     allopurinol (ZYLOPRIM) 100 MG tablet TAKE 1 TABLET EVERY DAY (Patient taking differently: Take 100 mg by mouth daily. ) 90 tablet 3   aspirin 81 MG tablet Take 81 mg by mouth daily.     atorvastatin (LIPITOR) 10 MG tablet TAKE 1 TABLET EVERY DAY (Patient taking differently: Take 10 mg by mouth every evening. ) 90 tablet 0   colchicine (COLCRYS) 0.6 MG tablet Take 1 tablet (0.6 mg total) by mouth 2 (two) times daily as needed. (Patient taking differently: Take 0.6 mg by mouth 2 (two) times daily as needed (gout). ) 60 tablet 0   feeding supplement, ENSURE ENLIVE, (ENSURE ENLIVE) LIQD Take 237 mLs by mouth 2 (two) times daily between meals. 237 mL 12   LORazepam (ATIVAN) 1 MG tablet Takes 1/2 to 1 tablet q day prn (Patient taking differently: Take 0.5-1 mg by mouth at bedtime as needed for sleep. ) 30 tablet 2   losartan (COZAAR) 25 MG tablet Take  0.5 tablets (12.5 mg total) by mouth daily. 45 tablet 3   metoprolol succinate (TOPROL-XL) 50 MG 24 hr tablet Take 1 tablet (50 mg total) by mouth 2 (two) times a day. Take with or immediately following a meal. 30 tablet 0   torsemide (DEMADEX) 20 MG tablet Take 2 tablets (40 mg total) by mouth daily. 180 tablet 3   fluticasone (FLOVENT DISKUS) 50 MCG/BLIST diskus inhaler Inhale 2 puffs into the lungs 2 (two) times daily. (Patient not taking: Reported on 12/26/2018) 1 Inhaler 1   No current facility-administered medications for this visit.    No Known Allergies   Review of Systems  Constitutional: Positive for activity change and fatigue.  Weight loss  HENT:  Full dentures  Eyes: Negative.  Respiratory: Positive for cough and shortness of breath.  Cardiovascular: Negative for chest pain and leg swelling.  Gastrointestinal: Negative.  Endocrine: Negative.  Genitourinary: Negative.  Musculoskeletal: Positive for arthralgias.  Skin: Negative.  Allergic/Immunologic: Negative.  Neurological: Negative for dizziness, syncope and light-headedness.  Hematological: Bruises/bleeds easily.  Psychiatric/Behavioral: Negative.   BP 112/73 (BP Location: Left Arm, Patient Position: Sitting, Cuff Size: Normal)   Pulse 65   Temp (!) 97.3 F (36.3 C) Comment: thermal   Resp 16   Ht 5\' 11"  (1.803 m)   Wt 204 lb (92.5 kg)   SpO2 95% Comment: RA   BMI 28.45 kg/m  Physical Exam  Constitutional:  Appearance: Normal appearance. He is normal weight.  HENT:  Head: Normocephalic and atraumatic.  Mouth/Throat:  Mouth: Mucous membranes are moist.  Pharynx: Oropharynx is clear.  Eyes:  Extraocular Movements: Extraocular movements intact.  Conjunctiva/sclera: Conjunctivae normal.  Pupils: Pupils are equal, round, and reactive to light.  Neck:  Musculoskeletal: Normal range of motion and neck supple.  Vascular: No carotid bruit.  Cardiovascular:  Rate and Rhythm: Normal rate and regular rhythm.    Pulses: Normal pulses.  Heart sounds: Murmur present.  Comments: 3/6 systolic murmur along the right sternal border. There is no diastolic murmur Pulmonary:  Comments: Decreased breath sounds in the bases Abdominal:  General: Abdomen is flat. Bowel sounds are normal. There is no distension.  Palpations: Abdomen is soft.  Tenderness: There is no abdominal tenderness.  Musculoskeletal: Normal range of motion.  General: No swelling.  Skin:  General: Skin is warm and dry.  Neurological:  General: No focal deficit present.  Mental Status: He is alert and oriented to person, place, and time.  Psychiatric:  Mood and Affect: Mood normal.  Behavior: Behavior normal.    Diagnostic Tests:   CLINICAL DATA: Shortness of breath  EXAM:  CT ANGIOGRAPHY CHEST WITH CONTRAST  TECHNIQUE:  Multidetector CT imaging of the chest was performed using the  standard protocol during bolus administration of intravenous  contrast. Multiplanar CT image reconstructions and MIPs were  obtained to evaluate the vascular anatomy.  CONTRAST: 40mL OMNIPAQUE IOHEXOL 350 MG/ML SOLN  COMPARISON: Chest radiograph December 11, 2018  FINDINGS:  Cardiovascular: There is no demonstrable pulmonary embolus. The  ascending thoracic aorta measures 4.1 x 4.1 cm in transverse  diameter. No dissection is seen. Note that the contrast bolus within  the aorta is not sufficient for confident exclusion of dissection is  a differential consideration radiographically. There are foci  calcification in the visualized great vessels. There are foci of  aortic atherosclerosis. There are foci of coronary artery  calcification at multiple sites. There is no pericardial effusion or  pericardial thickening evident. The main pulmonary outflow tract  measures 3.2 cm, prominent.  Mediastinum/Nodes: Visualized thyroid appears unremarkable. There  are multiple subcentimeter mediastinal lymph nodes. There are  multiple calcified lymph nodes  consistent with prior granulomatous  disease. There is a prominent right hilar lymph node measuring 1.5 x  1.2 cm. A second right hilar lymph node measures 1.3 x 1.1 cm. There  is a subcarinal lymph node measuring 1.3 x 1.2 cm. No esophageal  lesions evident.  Lungs/Pleura: There are sizable free-flowing pleural effusions  bilaterally with compressive atelectasis in each lung base  posteriorly. Elsewhere, there is patchy atelectasis in the upper  lobes with a somewhat mosaic attenuation bilaterally.  Upper Abdomen: In the visualized upper abdomen, there is aortic  atherosclerosis. There  are occasional calcified splenic granulomas.  The gallbladder appears somewhat distended without wall thickening.  Musculoskeletal: There is degenerative change in the thoracic and  upper lumbar regions. No blastic or lytic bone lesions are evident.  There is degenerative change in each shoulder. No chest wall lesions  are evident.  Review of the MIP images confirms the above findings.  IMPRESSION:  1. No demonstrable pulmonary embolus.  2. Prominence of the ascending thoracic aorta measuring 4.1 x 4.1 cm  in transverse diameter. There is aortic atherosclerosis as well as  foci of great vessel and coronary artery calcification. No  dissection evident; the contrast bolus in the aorta is not  sufficient for meaningful assessment for potential dissection.  Recommend annual imaging followup by CTA or MRA. This recommendation  follows 2010 ACCF/AHA/AATS/ACR/ASA/SCA/SCAI/SIR/STS/SVM Guidelines  for the Diagnosis and Management of Patients with Thoracic Aortic  Disease. Circulation. 2010; 121: Q034-V425. Aortic aneurysm NOS  (ICD10-I71.9).  3. Prominence of the main pulmonary outflow tract, an appearance  indicative of a degree of pulmonary arterial hypertension.  4. Sizable free-flowing pleural effusions bilaterally with bibasilar  atelectatic change. Mosaic attenuation elsewhere raises question of    underlying small airways obstructive disease.  5. Several prominent lymph nodes of uncertain etiology. Evidence of  prior granulomatous disease with several calcified mediastinal lymph  nodes. There is also calcification in the splenic artery consistent  with prior granulomatous disease.  Aortic Atherosclerosis (ICD10-I70.0).  Electronically Signed  By: Lowella Grip III M.D.  On: 12/11/2018 11:25     ECHOCARDIOGRAM REPORT  Patient Name: [name] [name] Date of Exam: 12/12/2018  Medical Rec #: [id number] Height: 70.0 in  Accession #: [id number] Weight: 215.4 lb  Date of Birth: 13-May-1931 BSA: 2.15 m  Patient Age: 65 years BP: 123/63 mmHg  Patient Gender: M HR: 64 bpm.  Exam Location: [facility]  Procedure: 2D Echo, Cardiac Doppler and Color Doppler  Indications: Dyspnea 786.09  History: Patient has no prior history of Echocardiogram examinations.  Signs/Symptoms: Murmur Risk Factors: Hypertension.  Hypercholesterolemia.  Sonographer: Sherrie Sport RDCS (AE)  Referring Phys: [id number] Loletha Grayer  Diagnosing Phys: Nelva Bush MD  IMPRESSIONS  1. Severe hypokinesis of the left ventricular, entire anterior wall, anterolateral wall and apical segment.  2. There is dyskinesis of the left ventricular, basal inferior wall.  3. The left ventricle has moderate-severely reduced systolic function, with an ejection fraction of 30-35%. The cavity size was normal. There is mildly increased left ventricular wall thickness. Left ventricular diastolic Doppler parameters are  consistent with restrictive filling. Elevated mean left atrial pressure.  4. Left ventricular apical thrombus cannot be excluded. Consider limited echo with Definity or cardiac MRI for further evaluation, as clinically indicated.  5. Large pleural effusion in the left lateral region.  6. The mitral valve is degenerative. Mild thickening of the mitral valve leaflet. Mitral valve regurgitation is moderate by color flow Doppler.   7. The tricuspid valve is grossly normal.  8. The aortic valve has an indeterminate number of cusps. Severely thickening of the aortic valve. Moderate calcification of the aortic valve. Aortic valve regurgitation is moderate by color flow Doppler. Severe stenosis of the aortic valve.  9. The interatrial septum was not well visualized.  FINDINGS  Left Ventricle: The left ventricle has moderate-severely reduced systolic function, with an ejection fraction of 30-35%. The cavity size was normal. There is mildly increased left ventricular wall thickness. Left ventricular diastolic Doppler parameters  are consistent with restrictive filling. Elevated mean left atrial  pressure Severe hypokinesis of the left ventricular, entire anterior wall, anterolateral wall and apical segment. There is dyskinesis of the left ventricular, basal inferior wall. Left  ventricular apical thrombus cannot be excluded. Consider limited echo with Definity or cardiac MRI for further evaluation, as clinically indicated.  Right Ventricle: The right ventricle has low normal systolic function. The cavity was normal. There is no increase in right ventricular wall thickness. Right ventricular systolic pressure is severely elevated (65-70 mmHg plus central venous pressure).  Left Atrium: Left atrial size was normal in size.  Right Atrium: Right atrial size was normal in size.  Interatrial Septum: The interatrial septum was not well visualized.  Pericardium: The pericardium was not well visualized. There is a large pleural effusion in the left lateral region.  Mitral Valve: The mitral valve is degenerative in appearance. Mild thickening of the mitral valve leaflet. Mitral valve regurgitation is moderate by color flow Doppler.  Tricuspid Valve: The tricuspid valve is grossly normal. Tricuspid valve regurgitation is mild by color flow Doppler.  Aortic Valve: The aortic valve has an indeterminate number of cusps Severely thickening of the  aortic valve, with severely restricted right coronary cusp excursion. Moderate calcification of the aortic valve. Aortic valve regurgitation is moderate by  color flow Doppler. There is Severe stenosis of the aortic valve, with a calculated valve area of 0.58 cm.  Pulmonic Valve: The pulmonic valve was not well visualized. Pulmonic valve regurgitation is mild by color flow Doppler. No evidence of pulmonic stenosis.  Aorta: The aorta was not well visualized.  Pulmonary Artery: The pulmonary artery is not well seen.  Venous: The inferior vena cava was not well visualized.  +--------------+--------++   LEFT VENTRICLE     +----------------+---------++  +--------------+--------++  Diastology        PLAX 2D      +----------------+---------++  +--------------+--------++  LV e' lateral:  7.07 cm/s     LVIDd:  5.35 cm    +----------------+---------++  +--------------+--------++  LV E/e' lateral: 14.1      LVIDs:  4.49 cm    +----------------+---------++  +--------------+--------++  LV e' medial:  4.03 cm/s     LV PW:  1.37 cm    +----------------+---------++  +--------------+--------++  LV E/e' medial:  24.8      LV IVS:  1.01 cm    +----------------+---------++  +--------------+--------++   LVOT diam:  2.00 cm     +--------------+--------++   LV SV:  46 ml     +--------------+--------++   LV SV Index:  20.84     +--------------+--------++   LVOT Area:  3.14 cm    +--------------+--------++          +--------------+--------++  +------------------+---------++   LV Volumes (MOD)       +------------------+---------++   LV area d, A4C:  40.40 cm    +------------------+---------++   LV area s, A4C:  29.50 cm    +------------------+---------++   LV major d, A4C:  8.64 cm     +------------------+---------++   LV major s, A4C:  8.01 cm     +------------------+---------++   LV vol d, MOD A4C: 149.0 ml     +------------------+---------++   LV vol s, MOD A4C: 88.8 ml       +------------------+---------++   LV SV MOD A4C:  149.0 ml     +------------------+---------++  +---------------+----------++   RIGHT VENTRICLE      +---------------+----------++   RV Basal diam:  4.13 cm     +---------------+----------++  RV S prime:  11.30 cm/s    +---------------+----------++   TAPSE (M-mode): 3.7 cm     +---------------+----------++  +-------------+-------++-----------++   LEFT ATRIUM     Index     +-------------+-------++-----------++   LA diam:  5.10 cm  2.37 cm/m     +-------------+-------++-----------++   LA Vol (A2C): 70.3 ml  32.63 ml/m    +-------------+-------++-----------++   LA Vol (A4C): 42.3 ml  19.64 ml/m    +-------------+-------++-----------++  +------------+---------++-----------++   RIGHT ATRIUM    Index     +------------+---------++-----------++   RA Area:  12.20 cm       +------------+---------++-----------++   RA Volume:  25.40 ml   11.79 ml/m    +------------+---------++-----------++  +------------------+------------++ +---------------+--------++   AORTIC VALVE       PULMONIC VALVE       +------------------+------------++ +---------------+--------++   AV Area (Vmax):  0.57 cm     PV Vmax:  0.91 m/s    +------------------+------------++ +---------------+--------++   AV Area (Vmean):  0.54 cm     PV Peak grad:  3.3 mmHg    +------------------+------------++ +---------------+--------++   AV Area (VTI):  0.58 cm     RVOT Peak grad: 5 mmHg     +------------------+------------++ +---------------+--------++   AV Vmax:  389.00 cm/s     +------------------+------------++   AV Vmean:  297.000 cm/s    +------------------+------------++   AV VTI:  1.011 m     +------------------+------------++   AV Peak Grad:  60.5 mmHg     +------------------+------------++   AV Mean Grad:  42.0 mmHg     +------------------+------------++   LVOT Vmax:  70.90 cm/s     +------------------+------------++   LVOT Vmean:  51.100 cm/s      +------------------+------------++   LVOT VTI:  0.187 m     +------------------+------------++   LVOT/AV VTI ratio: 0.18     +------------------+------------++   AR PHT:  401 msec     +------------------+------------++  +-------------+-------++   AORTA       +-------------+-------++   Ao Root diam: 3.20 cm    +-------------+-------++  +--------------+----------++ +---------------+-----------++   MITRAL VALVE       TRICUSPID VALVE      +--------------+----------++ +---------------+-----------++   MV Area (PHT): 4.96 cm     TR Peak grad:  66.1 mmHg     +--------------+----------++ +---------------+-----------++   MV PHT:  44.37 msec    TR Vmax:  413.00 cm/s    +--------------+----------++ +---------------+-----------++   MV Decel Time: 153 msec     +--------------+----------++ +--------------+-------+  +--------------+-----------++  SHUNTS       MV E velocity: 100.00 cm/s   +--------------+-------+  +--------------+-----------++  Systemic VTI:  0.19 m     MV A velocity: 47.10 cm/s    +--------------+-------+  +--------------+-----------++  Systemic Diam: 2.00 cm    MV E/A ratio:  2.12    +--------------+-------+  +--------------+-----------++  Nelva Bush MD  Electronically signed by Nelva Bush MD  Signature Date/Time: 12/12/2018/1:29:42 PM    End, Harrell Gave, MD (Primary)  Rise Mu, PA-C  Procedures  RIGHT HEART CATH AND CORONARY ANGIOGRAPHY  Conclusion  Conclusions:  1. Significant two-vessel coronary artery disease, including 80% ostial LAD stenosis and 80-90% stenosis of large RCA continuation. 2. Mild to moderate disease noted the proximal/mid LAD, RCA, and ramus intermedius. 3. Mildly elevated right heart filling pressures. 4. Moderately elevated left heart filling pressures. 5. Severe pulmonary hypertension. 6. Moderately reduced Fick cardiac output/index. Recommendations:  1. Outpatient cardiac surgery  consultation, given significant two-vessel  coronary artery disease (LAD stenosis not ideal for PCI) and severe aortic stenosis. 2. Continue current dose of torsemide; will plan for BMP early next week and consider escalation of torsemide +/- addition of low-dose ACEI/ARB. 3. Continue current dose of metoprolol. 4. Aggressive secondary prevention of coronary artery disease. 5. Follow-up in the office in two weeks; may need to consider referral to advanced heart failure clinic in [place] if the patient has recurrent heart failure symptoms or he does not tolerate escalation of diuresis/evidence-based heart failure therapy. Nelva Bush, MD  [facility]  Pager: [phone]   Recommendations  Antiplatelet/Anticoag Indefinite aspirin 81 mg daily pending cardiac surgery evaluation.  Discharge Date In the absence of any other complications or medical issues, we expect the patient to be ready for discharge from a cath perspective on 01/02/2019.  Indications  Acute systolic CHF (congestive heart failure) (HCC) [I50.21 (ICD-10-CM)]  Severe aortic stenosis [I35.0 (ICD-10-CM)]  Procedural Details  Technical Details Indication: 85 y.o. year-old man with history of hypertension, hyperlipidemia, chronic kidney disease stage III, asthma, arthritis, gout, and BPH, presenting for evaluation of recently diagnosed acute systolic heart failure (LVEF 30-35%) and severe aortic stenosis.  GFR: 47 ml/min  Procedure: The risks, benefits, complications, treatment options, and expected outcomes were discussed with the patient. The patient and/or family concurred with the proposed plan, giving informed consent. The patient was brought to the cath lab after IV hydration was begun and oral premedication was given. The patient was further sedated with Versed and Fentanyl. The right wrist was assessed with a modified Allens test which was normal. The right wrist and elbow were prepped and draped in a sterile fashion. 1% lidocaine was used for local anesthesia. A  previously placed antecubital vein IV was exchanged for a 21F slender Glidesheath using modified Seldinger technique. Right heart catheterization was performed by advancing a 21F balloon-tipped catheter through the right heart chambers into the pulmonary capillary wedge position. Pressure measurements and oxygen saturations were obtained.  Using the modified Seldinger access technique, a 40F slender Glidesheath was placed in the right radial artery. 3 mg Verapamil was given through the sheath. Heparin 4,500 units were administered. Selective coronary angiography was performed using 21F TIG4.0 catheter to engage the left and right coronary arteries. Left heart catheterization was not performed given known severe aortic stenosis.  At the end of the procedure, the radial artery sheath was removed and a TR band applied to achieve patent hemostasis. There were no immediate complications. The patient was taken to the recovery area in stable condition.  Contrast used: 70 mL Fluoroscopy time: 6.0 min Radiation dose: 551 mGy  Estimated blood loss <50 mL.   During this procedure medications were administered to achieve and maintain moderate conscious sedation while the patient's heart rate, blood pressure, and oxygen saturation were continuously monitored and I was present face-to-face 100% of this time.  Medications  (Filter: Administrations occurring from 01/02/19 0835 to 01/02/19 0940)          (important) Continuous medications are totaled by the amount administered until 01/02/19 0940.  Medication Rate/Dose/Volume Action  Date Time   midazolam (VERSED) injection (mg) 0.5 mg Given 01/02/19 0842   Total dose as of 01/02/19 0940        0.5 mg        fentaNYL (SUBLIMAZE) injection (mcg) 12.5 mcg Given 01/02/19 0842   Total dose as of 01/02/19 0940        12.5 mcg  verapamil (ISOPTIN) injection (mg) 2.5 mg Given 01/02/19 0859   Total dose as of 01/02/19 0940        2.5 mg        heparin injection  (Units) 4,500 Units Given 01/02/19 0901   Total dose as of 01/02/19 0940        4,500 Units        iohexol (OMNIPAQUE) 300 MG/ML solution (mL) 70 mL Given 01/02/19 0921   Total dose as of 01/02/19 0940        70 mL        Heparin (Porcine) in NaCl 1000-0.9 UT/500ML-% SOLN (mL) 500 mL Given 01/02/19 0922   Total dose as of 01/02/19 0940        500 mL        Sedation Time  Sedation Time Physician-1: 35 minutes 19 seconds  Complications  Complications documented before study signed (01/03/2019 6:76 AM)   No complications were associated with this study.  Documented by Nelva Bush, MD - 01/02/2019 9:56 AM  Coronary Findings  Diagnostic  Dominance: Right  Left Main  Vessel is large. Vessel is angiographically normal.  Left Anterior Descending  Vessel is large. There is moderate diffuse disease throughout the vessel.  Ost LAD lesion 80% stenosed  Ost LAD lesion is 80% stenosed. The lesion is eccentric. The lesion is moderately calcified.  First Diagonal Branch  Vessel is small in size.  Second Diagonal Branch  Vessel is small in size.  Third Diagonal Branch  Vessel is small in size.  Ramus Intermedius  Vessel is moderate in size.  Ramus lesion 50% stenosed  Ramus lesion is 50% stenosed.  Left Circumflex  Vessel is moderate in size.  First Obtuse Marginal Branch  Vessel is small in size.  Second Obtuse Marginal Branch  Vessel is small in size.  Right Coronary Artery  Vessel is large. There is mild diffuse disease throughout the vessel.  Right Posterior Descending Artery  Vessel is moderate in size.  RPDA lesion 50% stenosed  RPDA lesion is 50% stenosed.  Right Posterior Atrioventricular Artery  Vessel is large in size.  RPAV lesion 85% stenosed  RPAV lesion is 85% stenosed. The lesion is irregular.  Intervention  No interventions have been documented.  Right Heart  Right Heart Pressures RA (mean): 8 mmHg RV (S/EDP): 84/11 mmHg PA (S/D, mean): 84/35 (55) mmHg PCWP  (mean): 31 mmHg  Ao sat: 97% PA sat: 62%  Fick CO: 4.0 L/min Fick CI: 1.9 L/min/m^2  PVR: 6 Wood units  Coronary Diagrams  Diagnostic  Dominance: Right   Intervention  Implants     No implant documentation for this case.  Syngo Images  Link to Procedure Log   Show images for CARDIAC CATHETERIZATION Procedure Log  Images on Long Term Storage    Show images for Rajat, Staver Data (last day) before discharge   AO Systolic Cath Pressure  AO Diastolic Cath Pressure  AO Mean Cath Pressure  PA Systolic Cath Pressure  PA Diastolic Cath Pressure  PA Mean Cath Pressure  RA Wedge A Wave  RA Wedge V Wave  RV Systolic Cath Pressure  RV Diastolic Cath Pressure  RV End Diastolic  PCW A Wave  PCW V Wave  PCW Mean  AO O2 Sat  PA O2 Sat  AO O2 Sat  Fick C.O.  Fick C.I.   --  --  --  --  --  --  11 mmHg  10  mmHg  --  --  --  32 mmHg  32 mmHg  31 mmHg  --  --  --  4.01 L/min  1.87 L/min/m2   --  --  --  --  --  --  --  --  84 mmHg  7 mmHg  11 mmHg  --  --  --  --  --  --  --  --   --  --  --  84 mmHg  35 mmHg  55 mmHg  --  --  --  --  --  --  --  --  --  --  --  --  --   123  54 mmHg  73 mmHg  --  --  --  --  --  --  --  --  --  --  --  --  --  --  --  --   --  --  --  --  --  --  --  --  --  --  --  --  --  --  97.3 %  --  SA  --  --   --  --  --  --  --  --  --  --  --  --  --  --  --  --  --  MV  --  --  --   114  53 mmHg  80 mmHg  --  --  --  --  --  --  --  --  --  --  --  --  --  --  --  --   122  56 mmHg  84 mmHg  --  --  --  --                           Impression:   This 85 year old gentleman has stage D, severe, symptomatic aortic stenosis as well as severe multivessel coronary disease with moderate aortic insufficiency and moderate to severe left ventricular systolic dysfunction with ejection fraction of 30 to 35%. I suspect his reduced left ventricular systolic function is due to a combination of ischemia and valvular heart disease. He presented in June 2020 with New [place] Heart  Association class IV symptoms of congestive heart failure and has improved with diuresis. I agree that coronary bypass graft surgery and aortic valve replacement are indicated in this patient. His high-grade ostial LAD stenosis is not felt to be amenable to PCI. His operative risk is increased due to his advanced age and moderate to severe left ventricular systolic dysfunction but he is still very functional and surgical treatment is the only good option for him. He does have moderate mitral regurgitation on echocardiogram and hopefully this will improve with revascularization and aortic valve replacement. 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   I discussed the operative procedure with the patient and family including alternatives, benefits and risks; including but not limited to bleeding, blood transfusion, infection, stroke, myocardial infarction, graft failure, heart block requiring a permanent pacemaker, organ dysfunction, and death. [name] understands and agrees to proceed.   Plan:   Coronary artery bypass graft surgery and aortic valve replacement.  Gaye Pollack, MD  [facility]  [phone]

## 2019-01-24 NOTE — Telephone Encounter (Signed)
[facility] Research study: Spoke with patient in great detail about research study. Questions encouraged and answered. Informed Consent Form emailed to patient for his review. Patient has stated he would like to participate, however I have encouraged him to review the ICF and I will follow up prior to his surgery tomorrow.

## 2019-01-25 ENCOUNTER — Inpatient Hospital Stay (HOSPITAL_COMMUNITY): Payer: Medicare HMO | Admitting: Certified Registered"

## 2019-01-25 ENCOUNTER — Inpatient Hospital Stay (HOSPITAL_COMMUNITY)
Admission: RE | Disposition: A | Discharge status: Home / Self Care | Payer: Self-pay | Source: Home / Self Care | Attending: Surgery

## 2019-01-25 ENCOUNTER — Encounter (HOSPITAL_COMMUNITY): Payer: Self-pay | Admitting: *Deleted

## 2019-01-25 ENCOUNTER — Other Ambulatory Visit: Payer: Self-pay

## 2019-01-25 ENCOUNTER — Ambulatory Visit (HOSPITAL_COMMUNITY): Payer: Medicare HMO

## 2019-01-25 ENCOUNTER — Inpatient Hospital Stay (HOSPITAL_COMMUNITY): Payer: Medicare HMO | Admitting: Physician Assistant

## 2019-01-25 ENCOUNTER — Ambulatory Visit: Payer: Medicare HMO | Admitting: Internal Medicine

## 2019-01-25 ENCOUNTER — Inpatient Hospital Stay (HOSPITAL_COMMUNITY)
Admission: RE | Admit: 2019-01-25 | Discharge: 2019-01-31 | DRG: 219 | Disposition: A | Discharge status: Home / Self Care | Payer: Medicare HMO | Attending: Surgery | Admitting: Surgery

## 2019-01-25 ENCOUNTER — Inpatient Hospital Stay (HOSPITAL_COMMUNITY): Payer: Medicare HMO

## 2019-01-25 DIAGNOSIS — Z806 Family history of leukemia: Secondary | ICD-10-CM | POA: Diagnosis not present

## 2019-01-25 DIAGNOSIS — Z1159 Encounter for screening for other viral diseases: Secondary | ICD-10-CM | POA: Diagnosis not present

## 2019-01-25 DIAGNOSIS — I700 Atherosclerosis of aorta: Secondary | ICD-10-CM | POA: Diagnosis present

## 2019-01-25 DIAGNOSIS — I2510 Atherosclerotic heart disease of native coronary artery without angina pectoris: Secondary | ICD-10-CM | POA: Diagnosis present

## 2019-01-25 DIAGNOSIS — J90 Pleural effusion, not elsewhere classified: Secondary | ICD-10-CM

## 2019-01-25 DIAGNOSIS — I5023 Acute on chronic systolic (congestive) heart failure: Secondary | ICD-10-CM | POA: Diagnosis not present

## 2019-01-25 DIAGNOSIS — I110 Hypertensive heart disease with heart failure: Secondary | ICD-10-CM | POA: Diagnosis present

## 2019-01-25 DIAGNOSIS — Z8249 Family history of ischemic heart disease and other diseases of the circulatory system: Secondary | ICD-10-CM

## 2019-01-25 DIAGNOSIS — I350 Nonrheumatic aortic (valve) stenosis: Secondary | ICD-10-CM

## 2019-01-25 DIAGNOSIS — I2721 Secondary pulmonary arterial hypertension: Secondary | ICD-10-CM | POA: Diagnosis present

## 2019-01-25 DIAGNOSIS — E7800 Pure hypercholesterolemia, unspecified: Secondary | ICD-10-CM | POA: Diagnosis present

## 2019-01-25 DIAGNOSIS — E785 Hyperlipidemia, unspecified: Secondary | ICD-10-CM | POA: Diagnosis present

## 2019-01-25 DIAGNOSIS — Z7951 Long term (current) use of inhaled steroids: Secondary | ICD-10-CM

## 2019-01-25 DIAGNOSIS — M109 Gout, unspecified: Secondary | ICD-10-CM | POA: Diagnosis present

## 2019-01-25 DIAGNOSIS — I083 Combined rheumatic disorders of mitral, aortic and tricuspid valves: Secondary | ICD-10-CM | POA: Diagnosis present

## 2019-01-25 DIAGNOSIS — I77810 Thoracic aortic ectasia: Secondary | ICD-10-CM | POA: Diagnosis present

## 2019-01-25 DIAGNOSIS — D696 Thrombocytopenia, unspecified: Secondary | ICD-10-CM | POA: Diagnosis not present

## 2019-01-25 DIAGNOSIS — J9811 Atelectasis: Secondary | ICD-10-CM | POA: Diagnosis not present

## 2019-01-25 DIAGNOSIS — Z7982 Long term (current) use of aspirin: Secondary | ICD-10-CM | POA: Diagnosis not present

## 2019-01-25 DIAGNOSIS — N400 Enlarged prostate without lower urinary tract symptoms: Secondary | ICD-10-CM | POA: Diagnosis present

## 2019-01-25 DIAGNOSIS — D62 Acute posthemorrhagic anemia: Secondary | ICD-10-CM | POA: Diagnosis not present

## 2019-01-25 DIAGNOSIS — Z79899 Other long term (current) drug therapy: Secondary | ICD-10-CM | POA: Diagnosis not present

## 2019-01-25 DIAGNOSIS — F17220 Nicotine dependence, chewing tobacco, uncomplicated: Secondary | ICD-10-CM | POA: Diagnosis present

## 2019-01-25 DIAGNOSIS — Z803 Family history of malignant neoplasm of breast: Secondary | ICD-10-CM

## 2019-01-25 DIAGNOSIS — Z952 Presence of prosthetic heart valve: Secondary | ICD-10-CM

## 2019-01-25 DIAGNOSIS — Z951 Presence of aortocoronary bypass graft: Secondary | ICD-10-CM

## 2019-01-25 HISTORY — PX: TEE WITHOUT CARDIOVERSION: SHX5443

## 2019-01-25 HISTORY — PX: CORONARY ARTERY BYPASS GRAFT: SHX141

## 2019-01-25 HISTORY — PX: AORTIC VALVE REPLACEMENT: SHX41

## 2019-01-25 LAB — CBC
HCT: 31.4 % — ABNORMAL LOW (ref 39.0–52.0)
HCT: 26.5 % — ABNORMAL LOW (ref 39.0–52.0)
Hemoglobin: 10.7 g/dL — ABNORMAL LOW (ref 13.0–17.0)
Hemoglobin: 8.8 g/dL — ABNORMAL LOW (ref 13.0–17.0)
MCH: 34.4 pg — ABNORMAL HIGH (ref 26.0–34.0)
MCH: 34 pg (ref 26.0–34.0)
MCHC: 34.1 g/dL (ref 30.0–36.0)
MCHC: 33.2 g/dL (ref 30.0–36.0)
MCV: 101 fL — ABNORMAL HIGH (ref 80.0–100.0)
MCV: 102.3 fL — ABNORMAL HIGH (ref 80.0–100.0)
Platelets: 110 10*3/uL — ABNORMAL LOW (ref 150–400)
Platelets: 111 10*3/uL — ABNORMAL LOW (ref 150–400)
RBC: 3.11 MIL/uL — ABNORMAL LOW (ref 4.22–5.81)
RBC: 2.59 MIL/uL — ABNORMAL LOW (ref 4.22–5.81)
RDW: 13.3 % (ref 11.5–15.5)
RDW: 13.3 % (ref 11.5–15.5)
WBC: 16.4 10*3/uL — ABNORMAL HIGH (ref 4.0–10.5)
WBC: 16.1 10*3/uL — ABNORMAL HIGH (ref 4.0–10.5)
nRBC: 0 % (ref 0.0–0.2)
nRBC: 0 % (ref 0.0–0.2)

## 2019-01-25 LAB — POCT I-STAT 7, (LYTES, BLD GAS, ICA,H+H)
Acid-base deficit: 4 mmol/L — ABNORMAL HIGH (ref 0.0–2.0)
Acid-base deficit: 4 mmol/L — ABNORMAL HIGH (ref 0.0–2.0)
Acid-base deficit: 6 mmol/L — ABNORMAL HIGH (ref 0.0–2.0)
Bicarbonate: 21 mmol/L (ref 20.0–28.0)
Bicarbonate: 19.8 mmol/L — ABNORMAL LOW (ref 20.0–28.0)
Bicarbonate: 19.4 mmol/L — ABNORMAL LOW (ref 20.0–28.0)
Calcium, Ion: 1.19 mmol/L (ref 1.15–1.40)
Calcium, Ion: 1.22 mmol/L (ref 1.15–1.40)
Calcium, Ion: 1.26 mmol/L (ref 1.15–1.40)
HCT: 29 % — ABNORMAL LOW (ref 39.0–52.0)
HCT: 28 % — ABNORMAL LOW (ref 39.0–52.0)
HCT: 27 % — ABNORMAL LOW (ref 39.0–52.0)
Hemoglobin: 9.9 g/dL — ABNORMAL LOW (ref 13.0–17.0)
Hemoglobin: 9.5 g/dL — ABNORMAL LOW (ref 13.0–17.0)
Hemoglobin: 9.2 g/dL — ABNORMAL LOW (ref 13.0–17.0)
O2 Saturation: 93 %
O2 Saturation: 96 %
O2 Saturation: 93 %
Patient temperature: 35.8
Patient temperature: 36
Patient temperature: 35.8
Potassium: 3.3 mmol/L — ABNORMAL LOW (ref 3.5–5.1)
Potassium: 4 mmol/L (ref 3.5–5.1)
Potassium: 3.9 mmol/L (ref 3.5–5.1)
Sodium: 143 mmol/L (ref 135–145)
Sodium: 142 mmol/L (ref 135–145)
Sodium: 142 mmol/L (ref 135–145)
TCO2: 22 mmol/L (ref 22–32)
TCO2: 21 mmol/L — ABNORMAL LOW (ref 22–32)
TCO2: 21 mmol/L — ABNORMAL LOW (ref 22–32)
pCO2 arterial: 33.2 mmHg (ref 32.0–48.0)
pCO2 arterial: 30.7 mmHg — ABNORMAL LOW (ref 32.0–48.0)
pCO2 arterial: 37.3 mmHg (ref 32.0–48.0)
pH, Arterial: 7.404 (ref 7.350–7.450)
pH, Arterial: 7.413 (ref 7.350–7.450)
pH, Arterial: 7.319 — ABNORMAL LOW (ref 7.350–7.450)
pO2, Arterial: 62 mmHg — ABNORMAL LOW (ref 83.0–108.0)
pO2, Arterial: 77 mmHg — ABNORMAL LOW (ref 83.0–108.0)
pO2, Arterial: 67 mmHg — ABNORMAL LOW (ref 83.0–108.0)

## 2019-01-25 LAB — GLUCOSE, CAPILLARY
Glucose-Capillary: 100 mg/dL — ABNORMAL HIGH (ref 70–99)
Glucose-Capillary: 92 mg/dL (ref 70–99)
Glucose-Capillary: 96 mg/dL (ref 70–99)
Glucose-Capillary: 102 mg/dL — ABNORMAL HIGH (ref 70–99)
Glucose-Capillary: 106 mg/dL — ABNORMAL HIGH (ref 70–99)
Glucose-Capillary: 138 mg/dL — ABNORMAL HIGH (ref 70–99)
Glucose-Capillary: 135 mg/dL — ABNORMAL HIGH (ref 70–99)
Glucose-Capillary: 112 mg/dL — ABNORMAL HIGH (ref 70–99)
Glucose-Capillary: 109 mg/dL — ABNORMAL HIGH (ref 70–99)

## 2019-01-25 LAB — BASIC METABOLIC PANEL
Anion gap: 10 (ref 5–15)
BUN: 34 mg/dL — ABNORMAL HIGH (ref 8–23)
CO2: 21 mmol/L — ABNORMAL LOW (ref 22–32)
Calcium: 9 mg/dL (ref 8.9–10.3)
Chloride: 107 mmol/L (ref 98–111)
Creatinine, Ser: 1.43 mg/dL — ABNORMAL HIGH (ref 0.61–1.24)
GFR calc Af Amer: 51 mL/min — ABNORMAL LOW (ref >60)
GFR calc non Af Amer: 44 mL/min — ABNORMAL LOW (ref >60)
Glucose, Bld: 148 mg/dL — ABNORMAL HIGH (ref 70–99)
Potassium: 4.1 mmol/L (ref 3.5–5.1)
Sodium: 138 mmol/L (ref 135–145)

## 2019-01-25 LAB — POCT I-STAT, CHEM 8
BUN: 32 mg/dL — ABNORMAL HIGH (ref 8–23)
Calcium, Ion: 1.36 mmol/L (ref 1.15–1.40)
Chloride: 106 mmol/L (ref 98–111)
Creatinine, Ser: 1.3 mg/dL — ABNORMAL HIGH (ref 0.61–1.24)
Glucose, Bld: 144 mg/dL — ABNORMAL HIGH (ref 70–99)
HCT: 26 % — ABNORMAL LOW (ref 39.0–52.0)
Hemoglobin: 8.8 g/dL — ABNORMAL LOW (ref 13.0–17.0)
Potassium: 4 mmol/L (ref 3.5–5.1)
Sodium: 140 mmol/L (ref 135–145)
TCO2: 20 mmol/L — ABNORMAL LOW (ref 22–32)

## 2019-01-25 LAB — POCT I-STAT 4, (NA,K, GLUC, HGB,HCT)
Glucose, Bld: 107 mg/dL — ABNORMAL HIGH (ref 70–99)
HCT: 30 % — ABNORMAL LOW (ref 39.0–52.0)
Hemoglobin: 10.2 g/dL — ABNORMAL LOW (ref 13.0–17.0)
Potassium: 3.4 mmol/L — ABNORMAL LOW (ref 3.5–5.1)
Sodium: 142 mmol/L (ref 135–145)

## 2019-01-25 LAB — MAGNESIUM: Magnesium: 3 mg/dL — ABNORMAL HIGH (ref 1.7–2.4)

## 2019-01-25 LAB — PLATELET COUNT: Platelets: 129 10*3/uL — ABNORMAL LOW (ref 150–400)

## 2019-01-25 LAB — PROTIME-INR
INR: 1.7 — ABNORMAL HIGH (ref 0.8–1.2)
Prothrombin Time: 19.6 seconds — ABNORMAL HIGH (ref 11.4–15.2)

## 2019-01-25 LAB — HEMOGLOBIN AND HEMATOCRIT, BLOOD
HCT: 26.8 % — ABNORMAL LOW (ref 39.0–52.0)
Hemoglobin: 9 g/dL — ABNORMAL LOW (ref 13.0–17.0)

## 2019-01-25 LAB — APTT: aPTT: 46 seconds — ABNORMAL HIGH (ref 24–36)

## 2019-01-25 SURGERY — CORONARY ARTERY BYPASS GRAFTING (CABG)
Anesthesia: General | Site: Chest

## 2019-01-25 MED ORDER — FENTANYL CITRATE (PF) 250 MCG/5ML IJ SOLN
INTRAMUSCULAR | Status: AC
  Filled 2019-01-25: qty 10

## 2019-01-25 MED ORDER — OXYCODONE HCL 5 MG PO TABS
5.0000 mg | ORAL_TABLET | ORAL | Status: DC | PRN
  Administered 2019-01-26 – 2019-01-27 (×2): 5 mg via ORAL
  Filled 2019-01-25 (×3): qty 1

## 2019-01-25 MED ORDER — FENTANYL CITRATE (PF) 250 MCG/5ML IJ SOLN
INTRAMUSCULAR | Status: DC | PRN
  Administered 2019-01-25: 50 ug via INTRAVENOUS
  Administered 2019-01-25: 100 ug via INTRAVENOUS
  Administered 2019-01-25 (×2): 50 ug via INTRAVENOUS
  Administered 2019-01-25: 150 ug via INTRAVENOUS
  Administered 2019-01-25: 50 ug via INTRAVENOUS
  Administered 2019-01-25 (×2): 100 ug via INTRAVENOUS

## 2019-01-25 MED ORDER — INSULIN REGULAR BOLUS VIA INFUSION
0.0000 [IU] | Freq: Three times a day (TID) | INTRAVENOUS | Status: DC
  Filled 2019-01-25: qty 10

## 2019-01-25 MED ORDER — METOPROLOL TARTRATE 25 MG/10 ML ORAL SUSPENSION: 12.5000 mg | Freq: Two times a day (BID) | ORAL | Status: DC

## 2019-01-25 MED ORDER — PROTAMINE SULFATE 10 MG/ML IV SOLN
INTRAVENOUS | Status: AC
  Filled 2019-01-25: qty 25

## 2019-01-25 MED ORDER — CHLORHEXIDINE GLUCONATE 0.12 % MT SOLN
15.0000 mL | Freq: Once | OROMUCOSAL | Status: AC
  Administered 2019-01-25: 15 mL via OROMUCOSAL
  Filled 2019-01-25: qty 15

## 2019-01-25 MED ORDER — VANCOMYCIN HCL IN DEXTROSE 1-5 GM/200ML-% IV SOLN
1000.0000 mg | Freq: Once | INTRAVENOUS | Status: AC
  Administered 2019-01-25: 19:00:00 1000 mg via INTRAVENOUS
  Filled 2019-01-25: qty 200

## 2019-01-25 MED ORDER — ALBUMIN HUMAN 5 % IV SOLN
250.0000 mL | INTRAVENOUS | Status: DC | PRN
  Administered 2019-01-25 (×3): 12.5 g via INTRAVENOUS
  Filled 2019-01-25: qty 250
  Filled 2019-01-25: qty 500
  Filled 2019-01-25: qty 250

## 2019-01-25 MED ORDER — THROMBIN 20000 UNITS EX SOLR
OROMUCOSAL | Status: DC | PRN
  Administered 2019-01-25 (×3): 4 mL via TOPICAL

## 2019-01-25 MED ORDER — ASPIRIN EC 325 MG PO TBEC
325.0000 mg | DELAYED_RELEASE_TABLET | Freq: Every day | ORAL | Status: DC
  Administered 2019-01-26 – 2019-01-31 (×6): 325 mg via ORAL
  Filled 2019-01-25 (×7): qty 1

## 2019-01-25 MED ORDER — 0.9 % SODIUM CHLORIDE (POUR BTL) OPTIME
TOPICAL | Status: DC | PRN
  Administered 2019-01-25: 6000 mL

## 2019-01-25 MED ORDER — SODIUM CHLORIDE 0.9 % IV SOLN
INTRAVENOUS | Status: DC
  Administered 2019-01-25: 14:00:00 via INTRAVENOUS

## 2019-01-25 MED ORDER — CHLORHEXIDINE GLUCONATE 0.12 % MT SOLN
15.0000 mL | OROMUCOSAL | Status: AC
  Administered 2019-01-25: 15:00:00 15 mL via OROMUCOSAL

## 2019-01-25 MED ORDER — MAGNESIUM SULFATE 4 GM/100ML IV SOLN
4.0000 g | Freq: Once | INTRAVENOUS | Status: AC
  Administered 2019-01-25: 4 g via INTRAVENOUS
  Filled 2019-01-25: qty 100

## 2019-01-25 MED ORDER — ACETAMINOPHEN 160 MG/5ML PO SOLN: 650.0000 mg | Freq: Once | ORAL | Status: AC

## 2019-01-25 MED ORDER — HEPARIN SODIUM (PORCINE) 1000 UNIT/ML IJ SOLN
INTRAMUSCULAR | Status: AC
  Filled 2019-01-25: qty 1

## 2019-01-25 MED ORDER — PANTOPRAZOLE SODIUM 40 MG PO TBEC
40.0000 mg | DELAYED_RELEASE_TABLET | Freq: Every day | ORAL | Status: DC
  Administered 2019-01-27 – 2019-01-31 (×5): 40 mg via ORAL
  Filled 2019-01-25 (×5): qty 1

## 2019-01-25 MED ORDER — ALBUMIN HUMAN 5 % IV SOLN
INTRAVENOUS | Status: DC | PRN
  Administered 2019-01-25 (×2): via INTRAVENOUS

## 2019-01-25 MED ORDER — SODIUM CHLORIDE 0.9 % IV SOLN
1.5000 g | Freq: Two times a day (BID) | INTRAVENOUS | Status: AC
  Administered 2019-01-25 – 2019-01-27 (×4): 1.5 g via INTRAVENOUS
  Filled 2019-01-25 (×4): qty 1.5

## 2019-01-25 MED ORDER — NOREPINEPHRINE 4 MG/250ML-% IV SOLN
0.0000 ug/min | INTRAVENOUS | Status: DC
  Filled 2019-01-25: qty 250

## 2019-01-25 MED ORDER — NITROGLYCERIN IN D5W 200-5 MCG/ML-% IV SOLN: 0.0000 ug/min | INTRAVENOUS | Status: DC

## 2019-01-25 MED ORDER — PROPOFOL 10 MG/ML IV BOLUS
INTRAVENOUS | Status: AC
  Filled 2019-01-25: qty 20

## 2019-01-25 MED ORDER — MILRINONE LACTATE IN DEXTROSE 20-5 MG/100ML-% IV SOLN
0.3000 ug/kg/min | INTRAVENOUS | Status: DC
  Administered 2019-01-26: 0.3 ug/kg/min via INTRAVENOUS
  Filled 2019-01-25 (×2): qty 100

## 2019-01-25 MED ORDER — DEXMEDETOMIDINE HCL IN NACL 200 MCG/50ML IV SOLN: 0.0000 ug/kg/h | INTRAVENOUS | Status: DC

## 2019-01-25 MED ORDER — LACTATED RINGERS IV SOLN
INTRAVENOUS | Status: DC | PRN
  Administered 2019-01-25: 07:00:00 via INTRAVENOUS

## 2019-01-25 MED ORDER — PROPOFOL 10 MG/ML IV BOLUS
INTRAVENOUS | Status: DC | PRN
  Administered 2019-01-25: 30 mg via INTRAVENOUS

## 2019-01-25 MED ORDER — ALBUMIN HUMAN 5 % IV SOLN
12.5000 g | Freq: Once | INTRAVENOUS | Status: AC
  Administered 2019-01-25: 12.5 g via INTRAVENOUS

## 2019-01-25 MED ORDER — LACTATED RINGERS IV SOLN
INTRAVENOUS | Status: DC | PRN
  Administered 2019-01-25 (×2): via INTRAVENOUS

## 2019-01-25 MED ORDER — ORAL CARE MOUTH RINSE
15.0000 mL | Freq: Two times a day (BID) | OROMUCOSAL | Status: DC
  Administered 2019-01-25: 15 mL via OROMUCOSAL

## 2019-01-25 MED ORDER — ONDANSETRON HCL 4 MG/2ML IJ SOLN
4.0000 mg | Freq: Four times a day (QID) | INTRAMUSCULAR | Status: DC | PRN
  Administered 2019-01-25: 4 mg via INTRAVENOUS
  Filled 2019-01-25: qty 2

## 2019-01-25 MED ORDER — SODIUM CHLORIDE 0.9 % IV SOLN
INTRAVENOUS | Status: DC | PRN
  Administered 2019-01-25: 12:00:00 via INTRAVENOUS

## 2019-01-25 MED ORDER — METOPROLOL TARTRATE 12.5 MG HALF TABLET: 12.5000 mg | ORAL_TABLET | Freq: Two times a day (BID) | ORAL | Status: DC

## 2019-01-25 MED ORDER — ALBUMIN HUMAN 5 % IV SOLN
12.5000 g | Freq: Once | INTRAVENOUS | Status: AC | PRN
  Administered 2019-01-26: 12.5 g via INTRAVENOUS

## 2019-01-25 MED ORDER — ROCURONIUM BROMIDE 10 MG/ML (PF) SYRINGE
PREFILLED_SYRINGE | INTRAVENOUS | Status: DC | PRN
  Administered 2019-01-25: 80 mg via INTRAVENOUS
  Administered 2019-01-25: 20 mg via INTRAVENOUS
  Administered 2019-01-25 (×2): 50 mg via INTRAVENOUS

## 2019-01-25 MED ORDER — MIDAZOLAM HCL 2 MG/2ML IJ SOLN: 2.0000 mg | INTRAMUSCULAR | Status: DC | PRN

## 2019-01-25 MED ORDER — FAMOTIDINE IN NACL 20-0.9 MG/50ML-% IV SOLN
20.0000 mg | Freq: Every day | INTRAVENOUS | Status: DC
  Administered 2019-01-25: 20 mg via INTRAVENOUS

## 2019-01-25 MED ORDER — METOPROLOL TARTRATE 12.5 MG HALF TABLET: 12.5000 mg | ORAL_TABLET | Freq: Once | ORAL | Status: DC

## 2019-01-25 MED ORDER — HEMOSTATIC AGENTS (NO CHARGE) OPTIME
TOPICAL | Status: DC | PRN
  Administered 2019-01-25: 1 via TOPICAL

## 2019-01-25 MED ORDER — LORAZEPAM 0.5 MG PO TABS
0.5000 mg | ORAL_TABLET | Freq: Every evening | ORAL | Status: DC | PRN
  Administered 2019-01-26 – 2019-01-30 (×3): 1 mg via ORAL
  Filled 2019-01-25 (×3): qty 2

## 2019-01-25 MED ORDER — SODIUM CHLORIDE 0.45 % IV SOLN
INTRAVENOUS | Status: DC | PRN
  Administered 2019-01-25: 14:00:00 via INTRAVENOUS

## 2019-01-25 MED ORDER — MIDAZOLAM HCL (PF) 10 MG/2ML IJ SOLN
INTRAMUSCULAR | Status: AC
  Filled 2019-01-25: qty 2

## 2019-01-25 MED ORDER — PLASMA-LYTE 148 IV SOLN
INTRAVENOUS | Status: DC | PRN
  Administered 2019-01-25: 500 mL via INTRAVASCULAR

## 2019-01-25 MED ORDER — LACTATED RINGERS IV SOLN
INTRAVENOUS | Status: DC | PRN
  Administered 2019-01-25: 08:00:00 via INTRAVENOUS

## 2019-01-25 MED ORDER — ARTIFICIAL TEARS OPHTHALMIC OINT
TOPICAL_OINTMENT | OPHTHALMIC | Status: AC
  Filled 2019-01-25: qty 3.5

## 2019-01-25 MED ORDER — MIDAZOLAM HCL 5 MG/5ML IJ SOLN
INTRAMUSCULAR | Status: DC | PRN
  Administered 2019-01-25 (×5): 1 mg via INTRAVENOUS
  Administered 2019-01-25: 2 mg via INTRAVENOUS

## 2019-01-25 MED ORDER — BISACODYL 10 MG RE SUPP: 10.0000 mg | Freq: Every day | RECTAL | Status: DC

## 2019-01-25 MED ORDER — MORPHINE SULFATE (PF) 2 MG/ML IV SOLN: 1.0000 mg | INTRAVENOUS | Status: DC | PRN

## 2019-01-25 MED ORDER — ARTIFICIAL TEARS OPHTHALMIC OINT
TOPICAL_OINTMENT | OPHTHALMIC | Status: DC | PRN
  Administered 2019-01-25: 1 via OPHTHALMIC

## 2019-01-25 MED ORDER — SODIUM CHLORIDE 0.9% FLUSH: 3.0000 mL | INTRAVENOUS | Status: DC | PRN

## 2019-01-25 MED ORDER — PHENYLEPHRINE HCL (PRESSORS) 10 MG/ML IV SOLN
INTRAVENOUS | Status: DC | PRN
  Administered 2019-01-25: 40 ug via INTRAVENOUS
  Administered 2019-01-25: 80 ug via INTRAVENOUS

## 2019-01-25 MED ORDER — INSULIN REGULAR(HUMAN) IN NACL 100-0.9 UT/100ML-% IV SOLN: INTRAVENOUS | Status: DC

## 2019-01-25 MED ORDER — CALCIUM CHLORIDE 10 % IV SOLN
1.0000 g | Freq: Once | INTRAVENOUS | Status: AC
  Administered 2019-01-25: 1 g via INTRAVENOUS

## 2019-01-25 MED ORDER — POTASSIUM CHLORIDE 10 MEQ/50ML IV SOLN
10.0000 meq | INTRAVENOUS | Status: AC
  Administered 2019-01-25 (×3): 10 meq via INTRAVENOUS

## 2019-01-25 MED ORDER — BISACODYL 5 MG PO TBEC
10.0000 mg | DELAYED_RELEASE_TABLET | Freq: Every day | ORAL | Status: DC
  Administered 2019-01-26 – 2019-01-30 (×4): 10 mg via ORAL
  Filled 2019-01-25 (×6): qty 2

## 2019-01-25 MED ORDER — CHLORHEXIDINE GLUCONATE 4 % EX LIQD: 30.0000 mL | CUTANEOUS | Status: DC

## 2019-01-25 MED ORDER — LACTATED RINGERS IV SOLN: INTRAVENOUS | Status: DC

## 2019-01-25 MED ORDER — ACETAMINOPHEN 160 MG/5ML PO SOLN: 1000.0000 mg | Freq: Four times a day (QID) | ORAL | Status: AC

## 2019-01-25 MED ORDER — LACTATED RINGERS IV SOLN: 500.0000 mL | Freq: Once | INTRAVENOUS | Status: DC | PRN

## 2019-01-25 MED ORDER — ATORVASTATIN CALCIUM 10 MG PO TABS: 10.0000 mg | ORAL_TABLET | Freq: Every day | ORAL | Status: DC

## 2019-01-25 MED ORDER — SODIUM CHLORIDE 0.9% FLUSH
3.0000 mL | Freq: Two times a day (BID) | INTRAVENOUS | Status: DC
  Administered 2019-01-26 – 2019-01-30 (×9): 3 mL via INTRAVENOUS

## 2019-01-25 MED ORDER — PHENYLEPHRINE HCL-NACL 20-0.9 MG/250ML-% IV SOLN
0.0000 ug/min | INTRAVENOUS | Status: DC
  Administered 2019-01-25: 75 ug/min via INTRAVENOUS
  Administered 2019-01-25: 21:00:00 20 ug/min via INTRAVENOUS
  Filled 2019-01-25 (×2): qty 250

## 2019-01-25 MED ORDER — PROTAMINE SULFATE 10 MG/ML IV SOLN
INTRAVENOUS | Status: DC | PRN
  Administered 2019-01-25: 300 mg via INTRAVENOUS

## 2019-01-25 MED ORDER — TRAMADOL HCL 50 MG PO TABS
50.0000 mg | ORAL_TABLET | ORAL | Status: DC | PRN
  Administered 2019-01-25: 50 mg via ORAL
  Filled 2019-01-25: qty 1

## 2019-01-25 MED ORDER — ACETAMINOPHEN 500 MG PO TABS
1000.0000 mg | ORAL_TABLET | Freq: Four times a day (QID) | ORAL | Status: AC
  Administered 2019-01-26 – 2019-01-30 (×17): 1000 mg via ORAL
  Filled 2019-01-25 (×15): qty 2

## 2019-01-25 MED ORDER — NOREPINEPHRINE 4 MG/250ML-% IV SOLN
0.0000 ug/min | INTRAVENOUS | Status: DC
  Administered 2019-01-25: 18:00:00 2 ug/min via INTRAVENOUS
  Administered 2019-01-26: 10:00:00 7 ug/min via INTRAVENOUS
  Administered 2019-01-26 (×2): 9 ug/min via INTRAVENOUS
  Filled 2019-01-25 (×2): qty 250

## 2019-01-25 MED ORDER — THROMBIN (RECOMBINANT) 20000 UNITS EX SOLR
CUTANEOUS | Status: AC
  Filled 2019-01-25: qty 20000

## 2019-01-25 MED ORDER — METOPROLOL TARTRATE 5 MG/5ML IV SOLN: 2.5000 mg | INTRAVENOUS | Status: DC | PRN

## 2019-01-25 MED ORDER — HEPARIN SODIUM (PORCINE) 1000 UNIT/ML IJ SOLN
INTRAMUSCULAR | Status: DC | PRN
  Administered 2019-01-25: 30000 [IU] via INTRAVENOUS

## 2019-01-25 MED ORDER — PHENYLEPHRINE 40 MCG/ML (10ML) SYRINGE FOR IV PUSH (FOR BLOOD PRESSURE SUPPORT)
PREFILLED_SYRINGE | INTRAVENOUS | Status: AC
  Filled 2019-01-25: qty 10

## 2019-01-25 MED ORDER — DOPAMINE-DEXTROSE 3.2-5 MG/ML-% IV SOLN: 3.0000 ug/kg/min | INTRAVENOUS | Status: DC

## 2019-01-25 MED ORDER — SODIUM CHLORIDE 0.9 % IV SOLN: 250.0000 mL | INTRAVENOUS | Status: DC

## 2019-01-25 MED ORDER — ACETAMINOPHEN 650 MG RE SUPP
650.0000 mg | Freq: Once | RECTAL | Status: AC
  Administered 2019-01-25: 650 mg via RECTAL

## 2019-01-25 MED ORDER — ASPIRIN 81 MG PO CHEW: 324.0000 mg | CHEWABLE_TABLET | Freq: Every day | ORAL | Status: DC

## 2019-01-25 MED ORDER — THROMBIN 20000 UNITS EX SOLR
CUTANEOUS | Status: DC | PRN
  Administered 2019-01-25: 08:00:00 20000 [IU] via TOPICAL

## 2019-01-25 MED ORDER — DOCUSATE SODIUM 100 MG PO CAPS
200.0000 mg | ORAL_CAPSULE | Freq: Every day | ORAL | Status: DC
  Administered 2019-01-26 – 2019-01-30 (×5): 200 mg via ORAL
  Filled 2019-01-25 (×6): qty 2

## 2019-01-25 SURGICAL SUPPLY — 120 items
ADAPTER CARDIO PERF ANTE/RETRO (ADAPTER) ×8 IMPLANT
BAG DECANTER FOR FLEXI CONT (MISCELLANEOUS) ×4 IMPLANT
BASKET HEART  (ORDER IN 25'S) (MISCELLANEOUS) ×1
BASKET HEART (ORDER IN 25'S) (MISCELLANEOUS) ×1
BASKET HEART (ORDER IN 25S) (MISCELLANEOUS) ×2 IMPLANT
BLADE CLIPPER SURG (BLADE) ×4 IMPLANT
BLADE STERNUM SYSTEM 6 (BLADE) ×4 IMPLANT
BLADE SURG 11 STRL SS (BLADE) ×4 IMPLANT
BLADE SURG 15 STRL LF DISP TIS (BLADE) ×4 IMPLANT
BLADE SURG 15 STRL SS (BLADE) ×4
BNDG ELASTIC 4X5.8 VLCR STR LF (GAUZE/BANDAGES/DRESSINGS) ×4 IMPLANT
BNDG ELASTIC 6X5.8 VLCR STR LF (GAUZE/BANDAGES/DRESSINGS) ×4 IMPLANT
BNDG GAUZE ELAST 4 BULKY (GAUZE/BANDAGES/DRESSINGS) ×4 IMPLANT
CANISTER SUCT 3000ML PPV (MISCELLANEOUS) ×4 IMPLANT
CANNULA GUNDRY RCSP 15FR (MISCELLANEOUS) ×4 IMPLANT
CATH HEART VENT LEFT (CATHETERS) ×2 IMPLANT
CATH ROBINSON RED A/P 18FR (CATHETERS) ×12 IMPLANT
CATH THORACIC 28FR (CATHETERS) ×8 IMPLANT
CATH THORACIC 36FR (CATHETERS) ×4 IMPLANT
CATH THORACIC 36FR RT ANG (CATHETERS) ×4 IMPLANT
CLIP VESOCCLUDE MED 24/CT (CLIP) IMPLANT
CLIP VESOCCLUDE SM WIDE 24/CT (CLIP) ×8 IMPLANT
CONN Y 3/8X3/8X3/8  BEN (MISCELLANEOUS) ×2
CONN Y 3/8X3/8X3/8 BEN (MISCELLANEOUS) ×2 IMPLANT
CONT SPEC 4OZ CLIKSEAL STRL BL (MISCELLANEOUS) ×8 IMPLANT
COVER SURGICAL LIGHT HANDLE (MISCELLANEOUS) ×4 IMPLANT
COVER WAND RF STERILE (DRAPES) IMPLANT
DERMABOND ADVANCED (GAUZE/BANDAGES/DRESSINGS) ×2
DERMABOND ADVANCED .7 DNX12 (GAUZE/BANDAGES/DRESSINGS) ×2 IMPLANT
DRAPE CARDIOVASCULAR INCISE (DRAPES) ×2
DRAPE SLUSH/WARMER DISC (DRAPES) ×4 IMPLANT
DRAPE SRG 135X102X78XABS (DRAPES) ×2 IMPLANT
DRSG COVADERM 4X14 (GAUZE/BANDAGES/DRESSINGS) ×4 IMPLANT
ELECT CAUTERY BLADE 6.4 (BLADE) ×4 IMPLANT
ELECT REM PT RETURN 9FT ADLT (ELECTROSURGICAL) ×8
ELECTRODE REM PT RTRN 9FT ADLT (ELECTROSURGICAL) ×4 IMPLANT
FELT TEFLON 1X6 (MISCELLANEOUS) ×8 IMPLANT
GAUZE SPONGE 4X4 12PLY STRL (GAUZE/BANDAGES/DRESSINGS) ×8 IMPLANT
GLOVE BIO SURGEON STRL SZ 6 (GLOVE) ×12 IMPLANT
GLOVE BIO SURGEON STRL SZ 6.5 (GLOVE) IMPLANT
GLOVE BIO SURGEON STRL SZ7 (GLOVE) ×4 IMPLANT
GLOVE BIO SURGEON STRL SZ7.5 (GLOVE) ×8 IMPLANT
GLOVE BIO SURGEONS STRL SZ 6.5 (GLOVE)
GLOVE BIOGEL PI IND STRL 6 (GLOVE) ×8 IMPLANT
GLOVE BIOGEL PI IND STRL 6.5 (GLOVE) IMPLANT
GLOVE BIOGEL PI IND STRL 7.0 (GLOVE) IMPLANT
GLOVE BIOGEL PI INDICATOR 6 (GLOVE) ×8
GLOVE BIOGEL PI INDICATOR 6.5 (GLOVE)
GLOVE BIOGEL PI INDICATOR 7.0 (GLOVE)
GLOVE EUDERMIC 7 POWDERFREE (GLOVE) ×8 IMPLANT
GLOVE ORTHO TXT STRL SZ7.5 (GLOVE) IMPLANT
GLOVE SURG SS PI 6.0 STRL IVOR (GLOVE) ×8 IMPLANT
GOWN STRL REUS W/ TWL LRG LVL3 (GOWN DISPOSABLE) ×16 IMPLANT
GOWN STRL REUS W/ TWL XL LVL3 (GOWN DISPOSABLE) ×2 IMPLANT
GOWN STRL REUS W/TWL LRG LVL3 (GOWN DISPOSABLE) ×16
GOWN STRL REUS W/TWL XL LVL3 (GOWN DISPOSABLE) ×2
HEMOSTAT POWDER SURGIFOAM 1G (HEMOSTASIS) ×12 IMPLANT
HEMOSTAT SURGICEL 2X14 (HEMOSTASIS) ×4 IMPLANT
INSERT FOGARTY 61MM (MISCELLANEOUS) ×4 IMPLANT
INSERT FOGARTY XLG (MISCELLANEOUS) IMPLANT
KIT BASIN OR (CUSTOM PROCEDURE TRAY) ×4 IMPLANT
KIT CATH CPB BARTLE (MISCELLANEOUS) ×4 IMPLANT
KIT SUCTION CATH 14FR (SUCTIONS) ×4 IMPLANT
KIT TURNOVER KIT B (KITS) ×4 IMPLANT
KIT VASOVIEW HEMOPRO 2 VH 4000 (KITS) ×4 IMPLANT
LINE VENT (MISCELLANEOUS) ×4 IMPLANT
NS IRRIG 1000ML POUR BTL (IV SOLUTION) ×24 IMPLANT
PACK E OPEN HEART (SUTURE) ×4 IMPLANT
PACK OPEN HEART (CUSTOM PROCEDURE TRAY) ×4 IMPLANT
PAD ARMBOARD 7.5X6 YLW CONV (MISCELLANEOUS) ×8 IMPLANT
PAD ELECT DEFIB RADIOL ZOLL (MISCELLANEOUS) ×4 IMPLANT
PENCIL BUTTON HOLSTER BLD 10FT (ELECTRODE) ×4 IMPLANT
POSITIONER HEAD DONUT 9IN (MISCELLANEOUS) ×4 IMPLANT
PUNCH AORTIC ROTATE  4.5MM 8IN (MISCELLANEOUS) ×4 IMPLANT
PUNCH AORTIC ROTATE 4.0MM (MISCELLANEOUS) IMPLANT
PUNCH AORTIC ROTATE 4.5MM 8IN (MISCELLANEOUS) ×4 IMPLANT
PUNCH AORTIC ROTATE 5MM 8IN (MISCELLANEOUS) IMPLANT
SET CARDIOPLEGIA MPS 5001102 (MISCELLANEOUS) ×4 IMPLANT
SPONGE INTESTINAL PEANUT (DISPOSABLE) IMPLANT
SPONGE LAP 18X18 RF (DISPOSABLE) ×4 IMPLANT
SPONGE LAP 4X18 RFD (DISPOSABLE) ×4 IMPLANT
SUT BONE WAX W31G (SUTURE) ×4 IMPLANT
SUT ETHIBON 2 0 V 52N 30 (SUTURE) ×8 IMPLANT
SUT MNCRL AB 4-0 PS2 18 (SUTURE) ×4 IMPLANT
SUT PROLENE 3 0 SH DA (SUTURE) ×4 IMPLANT
SUT PROLENE 3 0 SH1 36 (SUTURE) ×4 IMPLANT
SUT PROLENE 4 0 RB 1 (SUTURE) ×6
SUT PROLENE 4 0 SH DA (SUTURE) IMPLANT
SUT PROLENE 4-0 RB1 .5 CRCL 36 (SUTURE) ×6 IMPLANT
SUT PROLENE 5 0 C 1 36 (SUTURE) IMPLANT
SUT PROLENE 6 0 C 1 30 (SUTURE) IMPLANT
SUT PROLENE 7 0 BV 1 (SUTURE) IMPLANT
SUT PROLENE 7 0 BV1 MDA (SUTURE) ×4 IMPLANT
SUT PROLENE 8 0 BV175 6 (SUTURE) IMPLANT
SUT SILK  1 MH (SUTURE) ×2
SUT SILK 1 MH (SUTURE) ×2 IMPLANT
SUT SILK 2 0 SH (SUTURE) IMPLANT
SUT STEEL 6MS V (SUTURE) IMPLANT
SUT STEEL STERNAL CCS#1 18IN (SUTURE) IMPLANT
SUT STEEL SZ 6 DBL 3X14 BALL (SUTURE) ×12 IMPLANT
SUT VIC AB 1 CTX 36 (SUTURE) ×4
SUT VIC AB 1 CTX36XBRD ANBCTR (SUTURE) ×4 IMPLANT
SUT VIC AB 2-0 CT1 27 (SUTURE) ×2
SUT VIC AB 2-0 CT1 TAPERPNT 27 (SUTURE) ×2 IMPLANT
SUT VIC AB 2-0 CTX 27 (SUTURE) IMPLANT
SUT VIC AB 3-0 SH 27 (SUTURE)
SUT VIC AB 3-0 SH 27X BRD (SUTURE) IMPLANT
SUT VIC AB 3-0 X1 27 (SUTURE) IMPLANT
SUT VICRYL 4-0 PS2 18IN ABS (SUTURE) IMPLANT
SYSTEM SAHARA CHEST DRAIN ATS (WOUND CARE) ×4 IMPLANT
TAPE CLOTH SURG 4X10 WHT LF (GAUZE/BANDAGES/DRESSINGS) ×8 IMPLANT
TOWEL GREEN STERILE (TOWEL DISPOSABLE) ×4 IMPLANT
TOWEL GREEN STERILE FF (TOWEL DISPOSABLE) ×4 IMPLANT
TRAY FOLEY SLVR 16FR TEMP STAT (SET/KITS/TRAYS/PACK) ×4 IMPLANT
TUBE SUCT INTRACARD DLP 20F (MISCELLANEOUS) ×4 IMPLANT
TUBING LAP HI FLOW INSUFFLATIO (TUBING) ×4 IMPLANT
UNDERPAD 30X30 (UNDERPADS AND DIAPERS) ×4 IMPLANT
VALVE AORTIC SZ25 INSP/RESIL (Prosthesis & Implant Heart) ×4 IMPLANT
VENT LEFT HEART 12002 (CATHETERS) ×4
WATER STERILE IRR 1000ML POUR (IV SOLUTION) ×8 IMPLANT

## 2019-01-25 NOTE — Op Note (Signed)
CARDIOVASCULAR SURGERY OPERATIVE NOTE  01/25/2019  Surgeon:  Gaye Pollack, MD  First Assistant: Nicholes Rough, PA-C   Preoperative Diagnosis:  Severe multi-vessel coronary artery disease                                              Severe aortic stenosis and moderate to severe aortic insufficiency                                               Severe LV systolic dysfunction   Postoperative Diagnosis:  Same   Procedure:  1. Median Sternotomy 2. Extracorporeal circulation 3.   Coronary artery bypass grafting x 3   Left internal mammary graft to the LAD  Sequential SVG to PDA and PL RCA   4.   Endoscopic vein harvest from the right leg 5.   Aortic valve replacement using a 25 mm Edwards Inspiris Resilia valve.  Anesthesia:  General Endotracheal   Clinical History/Surgical Indication:  This 85 year old gentleman has stage D, severe, symptomatic aortic stenosis as well as severe multivessel coronary disease with moderate aortic insufficiency and moderate to severe left ventricular systolic dysfunction with ejection fraction of 30 to 35%. I suspect his reduced left ventricular systolic function is due to a combination of ischemia and valvular heart disease. He presented in June 2020 with New York Heart Association class IV symptoms of congestive heart failure and has improved with diuresis. I agree that coronary bypass graft surgery and aortic valve replacement are indicated in this patient. His high-grade ostial LAD stenosis is not felt to be amenable to PCI. His operative risk is increased due to his advanced age and moderate to severe left ventricular systolic dysfunction but he is still very functional and surgical treatment is the only good option for him. He does have moderate mitral regurgitation on echocardiogram and hopefully this will improve with revascularization and aortic valve  replacement. 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   I discussed the operative procedure with the patient and family including alternatives, benefits and risks; including but not limited to bleeding, blood transfusion, infection, stroke, myocardial infarction, graft failure, heart block requiring a permanent pacemaker, organ dysfunction, and death. [name] [name] understands and agrees to proceed.    Preparation:  The patient was seen in the preoperative holding area and the correct patient, correct operation were confirmed with the patient after reviewing the medical record and catheterization. The consent was signed by me. Preoperative antibiotics were given. A pulmonary arterial line and radial arterial line were placed by the anesthesia team.  This showed pulmonary artery pressures that were two thirds systemic.  The patient was taken back to the operating room and positioned supine on the operating room table. After being placed under general endotracheal anesthesia by the anesthesia team a foley catheter was placed. The neck, chest, abdomen, and both legs were prepped with betadine soap and solution and draped in the usual sterile manner. A surgical time-out was taken and the correct patient and operative procedure were confirmed with the nursing and anesthesia staff.  TEE: performed by Dr. Laurie Panda  This showed a trileaflet aortic valve  that was severely calcified with severe aortic stenosis and moderate to severe aortic insufficiency.  There is moderate mitral regurgitation.  There was severe left ventricular systolic dysfunction with ejection fraction of 30 to 35%.  Right ventricular systolic function was normal.   Cardiopulmonary Bypass:  A median sternotomy was performed. The pericardium was opened in the midline. Right ventricular function appeared normal. The ascending aorta was of normal size  and had no palpable plaque. There were no contraindications to aortic cannulation or cross-clamping. The patient was fully systemically heparinized and the ACT was maintained > 400 sec. The proximal aortic arch was cannulated with a 20 F aortic cannula for arterial inflow. Venous cannulation was performed via the right atrial appendage using a two-staged venous cannula. An antegrade cardioplegia/vent cannula was inserted into the mid-ascending aorta. Aortic occlusion was performed with a single cross-clamp. Systemic cooling to 32 degrees Centigrade and topical cooling of the heart with iced saline were used. I tried giving hyperkalemic antegrade cold blood cardioplegia initially but due to the severity of aortic insufficiency there was no root pressure and therefore cardioplegia was switched to retrograde cold blood cardioplegia for the remainder of the case.  It was used to induce diastolic arrest and was then given at about 20 minute intervals throughout the period of arrest to maintain myocardial temperature at or below 10 degrees centigrade. A temperature probe was inserted into the interventricular septum and an insulating pad was placed in the pericardium.   Left internal mammary harvest:  The left side of the sternum was retracted using the Rultract retractor. The left internal mammary artery was harvested as a pedicle graft. All side branches were clipped. It was a large-sized vessel of good quality with excellent blood flow. It was ligated distally and divided. It was sprayed with topical papaverine solution to prevent vasospasm.   Endoscopic vein harvest:  The right greater saphenous vein was harvested endoscopically through a 2 cm incision medial to the right knee. It was harvested from the upper thigh to below the knee. It was a medium-sized vein of good quality. The side branches were all ligated with 4-0 silk ties.    Coronary arteries:  The coronary arteries were examined.   LAD:   Large vessel with mild distal disease.  LCX:  Small Ramus and OM  RCA:  Large PDA and PL with no significant distal disease except at the ostium of the PDA and continuation of the RCA before the PL branch.   Grafts:  1. LIMA to the LAD: 2.5 mm. It was sewn end to side using 8-0 prolene continuous suture. 2. Sequential SVG to PDA:  1.58mm. It was sewn sequential side to side using 7-0 prolene continuous suture. 3. Sequential SVG to PL:  1.75 mm. It was sewn sequential end to side using 7-0 prolene continuous suture.   The proximal vein graft anastomosis was performed to the mid-ascending aorta using continuous 6-0 prolene suture. A graft marker was placed around the proximal anastomosis.  Aortic Valve Replacement:   A transverse aortotomy was performed 1 cm above the take-off of the right coronary artery. The native valve was trileaflet with calcified leaflets and severe annular calcification. The ostia of the coronary arteries were in normal position and were not obstructed. The native valve leaflets were excised and the annulus was decalcified with rongeurs. Care was taken to remove all particulate debris. The left ventricle was directly inspected for debris and then irrigated with ice saline solution. The annulus was sized  and a size 25 mm Edwards INSPIRIS RESILIA pericardial valve was chosen. The model number was [id number] and the serial number was [id number].  While the valve was being prepared 2-0 Ethibond pledgeted horizontal mattress sutures were placed around the annulus with the pledgets in a sub-annular position. The sutures were placed through the sewing ring and the valve lowered into place. The sutures were tied sequentially. The valve seated nicely and the coronary ostia were not obstructed. The prosthetic valve leaflets moved normally and there was no sub-valvular obstruction. The aortotomy was closed using 4-0 Prolene suture in 2 layers with felt strips to reinforce the  closure.  Completion:  The patient was rewarmed to 37 degrees Centigrade. The clamp was removed from the LIMA pedicle and there was rapid warming of the septum and return of ventricular fibrillation. The crossclamp was removed with a time of 128 minutes. There was spontaneous return of sinus rhythm. The distal and proximal anastomoses were checked for hemostasis. The position of the grafts was satisfactory. Two temporary epicardial pacing wires were placed on the right atrium and two on the right ventricle. The patient was weaned from CPB without difficulty on milrinone 0.375 and dopamine 3.  CPB time was 154 minutes. Cardiac output was 5.5 LPM.  TEE showed normal prosthetic aortic valve function with no perivalvular leak.  The mean gradient across aortic valve prosthesis was 7 mmHg.  Left ventricular systolic function was improved with ejection fraction estimated at 50%.  The mitral regurgitation improved to mild.  Right ventricular function was normal.  Pulmonary pressures returned to normal.  Heparin was fully reversed with protamine and the aortic and venous cannulas removed. Hemostasis was achieved. Mediastinal and left pleural drainage tubes were placed. The sternum was closed with double #6 stainless steel wires. The fascia was closed with continuous # 1 vicryl suture. The subcutaneous tissue was closed with 2-0 vicryl continuous suture. The skin was closed with 3-0 vicryl subcuticular suture. All sponge, needle, and instrument counts were reported correct at the end of the case. Dry sterile dressings were placed over the incisions and around the chest tubes which were connected to pleurevac suction. The patient was then transported to the surgical intensive care unit in stable condition.

## 2019-01-25 NOTE — Anesthesia Procedure Notes (Signed)
Arterial Line Insertion Start/End8/11/2018 6:50 AM, 01/25/2019 6:53 AM Performed by: Barrington Ellison, CRNA, CRNA  Patient location: Pre-op. Preanesthetic checklist: patient identified and pre-op evaluation Lidocaine 1% used for infiltration and patient sedated Left, radial was placed Catheter size: 20 G Hand hygiene performed  and maximum sterile barriers used  Allen's test indicative of satisfactory collateral circulation Attempts: 1 Procedure performed without using ultrasound guided technique. Following insertion, dressing applied and Biopatch. Post procedure assessment: normal  Patient tolerated the procedure well with no immediate complications.

## 2019-01-25 NOTE — Anesthesia Procedure Notes (Signed)
Central Venous Catheter Insertion Performed by: Lillia Abed, MD, anesthesiologist Start/End8/11/2018 6:45 AM, 01/25/2019 7:00 AM Patient location: Pre-op. Preanesthetic checklist: patient identified, IV checked, risks and benefits discussed, surgical consent, monitors and equipment checked, pre-op evaluation, timeout performed and anesthesia consent Position: Trendelenburg Lidocaine 1% used for infiltration and patient sedated Hand hygiene performed  and maximum sterile barriers used  Catheter size: 8.5 Fr PA cath was placed.Sheath introducer Swan type:thermodilution Procedure performed using ultrasound guided technique. Ultrasound Notes:anatomy identified, needle tip was noted to be adjacent to the nerve/plexus identified, no ultrasound evidence of intravascular and/or intraneural injection and image(s) printed for medical record Attempts: 1 Following insertion, line sutured and dressing applied. Post procedure assessment: blood return through all ports, free fluid flow and no air  Patient tolerated the procedure well with no immediate complications.

## 2019-01-25 NOTE — Procedures (Signed)
Extubation Procedure Note  Patient Details:   Name: [name] DOB: [birth year] MRN: [id number]   Airway Documentation:    Vent end date: 01/25/19 Vent end time: 1726   Evaluation  O2 sats: stable throughout Complications: No apparent complications Patient did tolerate procedure well. Bilateral Breath Sounds: Clear, Diminished   Yes  RT extubated pt per wean protocol order. Pt NIF was -35 and VC was 2L. Pt had a cuff leak prior to extubation and was able to state his name and date of birth after extubation. Pt was placed on 4LNC and his saturations are 93-95%. Pt seems comfortable at this time. Will continue to monitor.   Lurline Caver A Palak Tercero 01/25/2019, 5:35 PM

## 2019-01-25 NOTE — Interval H&P Note (Signed)
History and Physical Interval Note:  01/25/2019 7:24 AM  [name] [name]  has presented today for surgery, with the diagnosis of CAD AS.  The various methods of treatment have been discussed with the patient and family. After consideration of risks, benefits and other options for treatment, the patient has consented to  Procedure(s): CORONARY ARTERY BYPASS GRAFTING (CABG) (N/A) AORTIC VALVE REPLACEMENT (AVR) (N/A) TRANSESOPHAGEAL ECHOCARDIOGRAM (TEE) (N/A) as a surgical intervention.  The patient's history has been reviewed, patient examined, no change in status, stable for surgery.  I have reviewed the patient's chart and labs.  Questions were answered to the patient's satisfaction.     Gaye Pollack

## 2019-01-25 NOTE — Anesthesia Preprocedure Evaluation (Signed)
Anesthesia Evaluation  Patient identified by MRN, date of birth, ID band Patient awake    Reviewed: Allergy & Precautions, NPO status , Patient's Chart, lab work & pertinent test results  History of Anesthesia Complications Negative for: history of anesthetic complications  Airway Mallampati: II  TM Distance: >3 FB Neck ROM: Full    Dental  (+) Edentulous Upper, Edentulous Lower   Pulmonary shortness of breath,    breath sounds clear to auscultation       Cardiovascular hypertension, +CHF  + Valvular Problems/Murmurs  Rhythm:Regular + Systolic murmurs    Neuro/Psych negative neurological ROS  negative psych ROS   GI/Hepatic negative GI ROS, Neg liver ROS,   Endo/Other  diabetes  Renal/GU Renal disease     Musculoskeletal  (+) Arthritis ,   Abdominal   Peds  Hematology   Anesthesia Other Findings   Reproductive/Obstetrics                             Anesthesia Physical Anesthesia Plan  ASA: IV  Anesthesia Plan: General   Post-op Pain Management:    Induction: Intravenous  PONV Risk Score and Plan: 2 and Treatment may vary due to age or medical condition  Airway Management Planned: Oral ETT  Additional Equipment: Arterial line, CVP, PA Cath, TEE and Ultrasound Guidance Line Placement  Intra-op Plan:   Post-operative Plan: Post-operative intubation/ventilation  Informed Consent: I have reviewed the patients History and Physical, chart, labs and discussed the procedure including the risks, benefits and alternatives for the proposed anesthesia with the patient or authorized representative who has indicated his/her understanding and acceptance.     Dental advisory given  Plan Discussed with: CRNA and Surgeon  Anesthesia Plan Comments:         Anesthesia Quick Evaluation

## 2019-01-25 NOTE — Progress Notes (Signed)
      [address]       [place] [address]             [phone]    S/p AVR, CABG  Extubated BP 96/61 (BP Location: Right Arm)   Pulse 88   Temp (!) 96.6 F (35.9 C)   Resp 16   Ht 5\' 11"  (1.803 m)   Wt 92 kg   SpO2 93%   BMI 28.30 kg/m  37/14, CI= 1.9  Intake/Output Summary (Last 24 hours) at 01/25/2019 1756 Last data filed at 01/25/2019 1700 Gross per 24 hour  Intake 4014.08 ml  Output 2475 ml  Net 1539.08 ml   Hct= 31  Doing well early postop  Remo Lipps C. Roxan Hockey, MD [facility] [phone]

## 2019-01-25 NOTE — Transfer of Care (Signed)
Immediate Anesthesia Transfer of Care Note  Patient: [name] [name]  Procedure(s) Performed: CORONARY ARTERY BYPASS GRAFTING (CABG) times three (N/A Chest) AORTIC VALVE REPLACEMENT (AVR) 50mm Inspiris valve (N/A Chest) TRANSESOPHAGEAL ECHOCARDIOGRAM (TEE) (N/A )  Patient Location: ICU  Anesthesia Type:General  Level of Consciousness: Patient remains intubated per anesthesia plan  Airway & Oxygen Therapy: Patient remains intubated per anesthesia plan and Patient placed on Ventilator (see vital sign flow sheet for setting)  Post-op Assessment: Report given to RN  Post vital signs: Reviewed and stable  Last Vitals:  Vitals Value Taken Time  BP    Temp 35.9 C 01/25/19 1347  Pulse 88 01/25/19 1347  Resp 14 01/25/19 1347  SpO2 96 % 01/25/19 1347  Vitals shown include unvalidated device data.  Last Pain:  Vitals:   01/25/19 0611  PainSc: 0-No pain      Patients Stated Pain Goal: 3 (00/71/21 9758)  Complications: No apparent anesthesia complications

## 2019-01-25 NOTE — Brief Op Note (Signed)
01/25/2019  8:04 AM  PATIENT:  [name] [name]  85 y.o. male  PRE-OPERATIVE DIAGNOSIS:  Coronary Artery Disease Aortic Stenosis  POST-OPERATIVE DIAGNOSIS:  Coronary Artery Disease Aortic Stenosis  PROCEDURE:  Procedure(s): CORONARY ARTERY BYPASS GRAFTING (CABG) times three (N/A) AORTIC VALVE REPLACEMENT (AVR) 38mm Inspiris valve (N/A) TRANSESOPHAGEAL ECHOCARDIOGRAM (TEE) (N/A)  SVG to PDA and PL LIMA to LAD  SURGEON:  Surgeon(s) and Role:    * Bartle, Fernande Boyden, MD - Primary  PHYSICIAN ASSISTANT:  Nicholes Rough, PA-C   ANESTHESIA:   general  EBL:  800 mL   BLOOD ADMINISTERED:none  DRAINS: routine, two pleural chest tubes   LOCAL MEDICATIONS USED:  NONE  SPECIMEN:  Source of Specimen:  aortic valve leaflets  DISPOSITION OF SPECIMEN:  PATHOLOGY  COUNTS:  YES  DICTATION: .Dragon Dictation  PLAN OF CARE: Admit to inpatient   PATIENT DISPOSITION:  ICU - intubated and hemodynamically stable.   Delay start of Pharmacological VTE agent (>24hrs) due to surgical blood loss or risk of bleeding: yes

## 2019-01-25 NOTE — Anesthesia Procedure Notes (Signed)
Procedure Name: Intubation Date/Time: 01/25/2019 7:53 AM Performed by: Barrington Ellison, CRNA Pre-anesthesia Checklist: Patient identified, Emergency Drugs available, Suction available and Patient being monitored Patient Re-evaluated:Patient Re-evaluated prior to induction Oxygen Delivery Method: Circle System Utilized Preoxygenation: Pre-oxygenation with 100% oxygen Induction Type: IV induction Ventilation: Mask ventilation without difficulty Laryngoscope Size: Mac and 4 Grade View: Grade I Tube type: Oral Tube size: 8.0 mm Number of attempts: 1 Airway Equipment and Method: Stylet and Oral airway Placement Confirmation: ETT inserted through vocal cords under direct vision,  positive ETCO2 and breath sounds checked- equal and bilateral Secured at: 22 cm Tube secured with: Tape Dental Injury: Teeth and Oropharynx as per pre-operative assessment

## 2019-01-25 NOTE — Progress Notes (Signed)
  Echocardiogram Echocardiogram Transesophageal has been performed.  [name] [name] 01/25/2019, 6:06 PM

## 2019-01-26 ENCOUNTER — Inpatient Hospital Stay (HOSPITAL_COMMUNITY): Payer: Medicare HMO

## 2019-01-26 ENCOUNTER — Encounter (HOSPITAL_COMMUNITY): Payer: Self-pay | Admitting: Surgery

## 2019-01-26 LAB — BASIC METABOLIC PANEL
Anion gap: 9 (ref 5–15)
Anion gap: 11 (ref 5–15)
BUN: 34 mg/dL — ABNORMAL HIGH (ref 8–23)
BUN: 35 mg/dL — ABNORMAL HIGH (ref 8–23)
CO2: 19 mmol/L — ABNORMAL LOW (ref 22–32)
CO2: 18 mmol/L — ABNORMAL LOW (ref 22–32)
Calcium: 8.9 mg/dL (ref 8.9–10.3)
Calcium: 8.8 mg/dL — ABNORMAL LOW (ref 8.9–10.3)
Chloride: 107 mmol/L (ref 98–111)
Chloride: 103 mmol/L (ref 98–111)
Creatinine, Ser: 1.39 mg/dL — ABNORMAL HIGH (ref 0.61–1.24)
Creatinine, Ser: 1.67 mg/dL — ABNORMAL HIGH (ref 0.61–1.24)
GFR calc Af Amer: 53 mL/min — ABNORMAL LOW (ref >60)
GFR calc Af Amer: 42 mL/min — ABNORMAL LOW (ref >60)
GFR calc non Af Amer: 46 mL/min — ABNORMAL LOW (ref >60)
GFR calc non Af Amer: 36 mL/min — ABNORMAL LOW (ref >60)
Glucose, Bld: 107 mg/dL — ABNORMAL HIGH (ref 70–99)
Glucose, Bld: 167 mg/dL — ABNORMAL HIGH (ref 70–99)
Potassium: 4 mmol/L (ref 3.5–5.1)
Potassium: 4.4 mmol/L (ref 3.5–5.1)
Sodium: 135 mmol/L (ref 135–145)
Sodium: 132 mmol/L — ABNORMAL LOW (ref 135–145)

## 2019-01-26 LAB — CBC
HCT: 24.3 % — ABNORMAL LOW (ref 39.0–52.0)
HCT: 27.1 % — ABNORMAL LOW (ref 39.0–52.0)
Hemoglobin: 8.1 g/dL — ABNORMAL LOW (ref 13.0–17.0)
Hemoglobin: 9.1 g/dL — ABNORMAL LOW (ref 13.0–17.0)
MCH: 34 pg (ref 26.0–34.0)
MCH: 34 pg (ref 26.0–34.0)
MCHC: 33.3 g/dL (ref 30.0–36.0)
MCHC: 33.6 g/dL (ref 30.0–36.0)
MCV: 102.1 fL — ABNORMAL HIGH (ref 80.0–100.0)
MCV: 101.1 fL — ABNORMAL HIGH (ref 80.0–100.0)
Platelets: 105 10*3/uL — ABNORMAL LOW (ref 150–400)
Platelets: 98 10*3/uL — ABNORMAL LOW (ref 150–400)
RBC: 2.38 MIL/uL — ABNORMAL LOW (ref 4.22–5.81)
RBC: 2.68 MIL/uL — ABNORMAL LOW (ref 4.22–5.81)
RDW: 13.4 % (ref 11.5–15.5)
RDW: 15.2 % (ref 11.5–15.5)
WBC: 13.9 10*3/uL — ABNORMAL HIGH (ref 4.0–10.5)
WBC: 13.2 10*3/uL — ABNORMAL HIGH (ref 4.0–10.5)
nRBC: 0 % (ref 0.0–0.2)
nRBC: 0 % (ref 0.0–0.2)

## 2019-01-26 LAB — POCT I-STAT 7, (LYTES, BLD GAS, ICA,H+H)
Acid-Base Excess: 1 mmol/L (ref 0.0–2.0)
Bicarbonate: 25.7 mmol/L (ref 20.0–28.0)
Calcium, Ion: 1.04 mmol/L — ABNORMAL LOW (ref 1.15–1.40)
HCT: 26 % — ABNORMAL LOW (ref 39.0–52.0)
Hemoglobin: 8.8 g/dL — ABNORMAL LOW (ref 13.0–17.0)
O2 Saturation: 100 %
Potassium: 3.5 mmol/L (ref 3.5–5.1)
Sodium: 141 mmol/L (ref 135–145)
TCO2: 27 mmol/L (ref 22–32)
pCO2 arterial: 39.3 mmHg (ref 32.0–48.0)
pH, Arterial: 7.423 (ref 7.350–7.450)
pO2, Arterial: 345 mmHg — ABNORMAL HIGH (ref 83.0–108.0)

## 2019-01-26 LAB — POCT I-STAT 4, (NA,K, GLUC, HGB,HCT)
Glucose, Bld: 119 mg/dL — ABNORMAL HIGH (ref 70–99)
Glucose, Bld: 126 mg/dL — ABNORMAL HIGH (ref 70–99)
Glucose, Bld: 146 mg/dL — ABNORMAL HIGH (ref 70–99)
Glucose, Bld: 161 mg/dL — ABNORMAL HIGH (ref 70–99)
Glucose, Bld: 143 mg/dL — ABNORMAL HIGH (ref 70–99)
HCT: 35 % — ABNORMAL LOW (ref 39.0–52.0)
HCT: 32 % — ABNORMAL LOW (ref 39.0–52.0)
HCT: 28 % — ABNORMAL LOW (ref 39.0–52.0)
HCT: 26 % — ABNORMAL LOW (ref 39.0–52.0)
HCT: 26 % — ABNORMAL LOW (ref 39.0–52.0)
Hemoglobin: 11.9 g/dL — ABNORMAL LOW (ref 13.0–17.0)
Hemoglobin: 10.9 g/dL — ABNORMAL LOW (ref 13.0–17.0)
Hemoglobin: 9.5 g/dL — ABNORMAL LOW (ref 13.0–17.0)
Hemoglobin: 8.8 g/dL — ABNORMAL LOW (ref 13.0–17.0)
Hemoglobin: 8.8 g/dL — ABNORMAL LOW (ref 13.0–17.0)
Potassium: 3.3 mmol/L — ABNORMAL LOW (ref 3.5–5.1)
Potassium: 3.2 mmol/L — ABNORMAL LOW (ref 3.5–5.1)
Potassium: 3.3 mmol/L — ABNORMAL LOW (ref 3.5–5.1)
Potassium: 3.3 mmol/L — ABNORMAL LOW (ref 3.5–5.1)
Potassium: 3.4 mmol/L — ABNORMAL LOW (ref 3.5–5.1)
Sodium: 141 mmol/L (ref 135–145)
Sodium: 140 mmol/L (ref 135–145)
Sodium: 140 mmol/L (ref 135–145)
Sodium: 138 mmol/L (ref 135–145)
Sodium: 141 mmol/L (ref 135–145)

## 2019-01-26 LAB — GLUCOSE, CAPILLARY
Glucose-Capillary: 101 mg/dL — ABNORMAL HIGH (ref 70–99)
Glucose-Capillary: 105 mg/dL — ABNORMAL HIGH (ref 70–99)
Glucose-Capillary: 107 mg/dL — ABNORMAL HIGH (ref 70–99)
Glucose-Capillary: 124 mg/dL — ABNORMAL HIGH (ref 70–99)
Glucose-Capillary: 147 mg/dL — ABNORMAL HIGH (ref 70–99)
Glucose-Capillary: 151 mg/dL — ABNORMAL HIGH (ref 70–99)
Glucose-Capillary: 162 mg/dL — ABNORMAL HIGH (ref 70–99)
Glucose-Capillary: 99 mg/dL (ref 70–99)

## 2019-01-26 LAB — PREPARE RBC (CROSSMATCH)

## 2019-01-26 LAB — MAGNESIUM
Magnesium: 2.7 mg/dL — ABNORMAL HIGH (ref 1.7–2.4)
Magnesium: 2.6 mg/dL — ABNORMAL HIGH (ref 1.7–2.4)

## 2019-01-26 MED ORDER — INSULIN ASPART 100 UNIT/ML ~~LOC~~ SOLN: 0.0000 [IU] | SUBCUTANEOUS | Status: DC

## 2019-01-26 MED ORDER — TRAMADOL HCL 50 MG PO TABS: 50.0000 mg | ORAL_TABLET | ORAL | Status: DC | PRN

## 2019-01-26 MED ORDER — INSULIN ASPART 100 UNIT/ML ~~LOC~~ SOLN
0.0000 [IU] | Freq: Three times a day (TID) | SUBCUTANEOUS | Status: DC
  Administered 2019-01-26: 4 [IU] via SUBCUTANEOUS
  Administered 2019-01-27 (×2): 2 [IU] via SUBCUTANEOUS

## 2019-01-26 MED ORDER — INSULIN ASPART 100 UNIT/ML ~~LOC~~ SOLN
0.0000 [IU] | Freq: Three times a day (TID) | SUBCUTANEOUS | Status: DC
  Administered 2019-01-26: 13:00:00 2 [IU] via SUBCUTANEOUS

## 2019-01-26 MED ORDER — INSULIN ASPART 100 UNIT/ML ~~LOC~~ SOLN
0.0000 [IU] | SUBCUTANEOUS | Status: DC
  Administered 2019-01-26: 2 [IU] via SUBCUTANEOUS

## 2019-01-26 MED ORDER — GLUCERNA SHAKE PO LIQD
237.0000 mL | Freq: Three times a day (TID) | ORAL | Status: DC
  Administered 2019-01-26: 10:00:00 237 mL via ORAL

## 2019-01-26 MED ORDER — ATORVASTATIN CALCIUM 80 MG PO TABS
80.0000 mg | ORAL_TABLET | Freq: Every morning | ORAL | Status: DC
  Administered 2019-01-26 – 2019-01-31 (×6): 80 mg via ORAL
  Filled 2019-01-26 (×7): qty 1

## 2019-01-26 MED ORDER — SODIUM CHLORIDE 0.9% IV SOLUTION
Freq: Once | INTRAVENOUS | Status: AC
  Administered 2019-01-26: 12:00:00 10 mL/h via INTRAVENOUS

## 2019-01-26 MED ORDER — CHLORHEXIDINE GLUCONATE CLOTH 2 % EX PADS
6.0000 | MEDICATED_PAD | Freq: Every day | CUTANEOUS | Status: DC
  Administered 2019-01-26 – 2019-01-31 (×5): 6 via TOPICAL

## 2019-01-26 MED ORDER — ORAL CARE MOUTH RINSE
15.0000 mL | Freq: Two times a day (BID) | OROMUCOSAL | Status: DC
  Administered 2019-01-26 – 2019-01-31 (×7): 15 mL via OROMUCOSAL

## 2019-01-26 MED ORDER — BOOST / RESOURCE BREEZE PO LIQD CUSTOM
1.0000 | Freq: Three times a day (TID) | ORAL | Status: DC
  Administered 2019-01-26: 19:00:00 1 via ORAL

## 2019-01-26 MED ORDER — INSULIN DETEMIR 100 UNIT/ML ~~LOC~~ SOLN
10.0000 [IU] | Freq: Every day | SUBCUTANEOUS | Status: DC
  Administered 2019-01-27 – 2019-01-28 (×2): 10 [IU] via SUBCUTANEOUS
  Filled 2019-01-26 (×3): qty 0.1

## 2019-01-26 MED ORDER — PRO-STAT SUGAR FREE PO LIQD: 30.0000 mL | Freq: Two times a day (BID) | ORAL | Status: DC

## 2019-01-26 MED ORDER — INSULIN DETEMIR 100 UNIT/ML ~~LOC~~ SOLN
10.0000 [IU] | Freq: Once | SUBCUTANEOUS | Status: AC
  Administered 2019-01-26: 10 [IU] via SUBCUTANEOUS
  Filled 2019-01-26: qty 0.1

## 2019-01-26 MED FILL — Thrombin (Recombinant) For Soln 20000 Unit: CUTANEOUS | Qty: 1 | Status: AC

## 2019-01-26 NOTE — Plan of Care (Signed)
  Problem: Clinical Measurements: Goal: Diagnostic test results will improve Outcome: Progressing   Problem: Clinical Measurements: Goal: Cardiovascular complication will be avoided Outcome: Progressing   Problem: Respiratory: Goal: Respiratory status will improve Outcome: Progressing   Problem: Urinary Elimination: Goal: Ability to achieve and maintain adequate renal perfusion and functioning will improve Outcome: Progressing

## 2019-01-26 NOTE — Progress Notes (Addendum)
T[facility] DAILY ICU PROGRESS NOTE                   [address]            [place] [address]          [phone]   1 Day Post-Op Procedure(s) (LRB): CORONARY ARTERY BYPASS GRAFTING (CABG) times three (N/A) AORTIC VALVE REPLACEMENT (AVR) 44mm Inspiris valve (N/A) TRANSESOPHAGEAL ECHOCARDIOGRAM (TEE) (N/A)  Total Length of Stay:  LOS: 1 day   Subjective: Feels good this morning. No complaints.   Objective: Vital signs in last 24 hours: Temp:  [96.3 F (35.7 C)-97.5 F (36.4 C)] 97.5 F (36.4 C) (08/07 0800) Pulse Rate:  [69-99] 77 (08/07 0800) Cardiac Rhythm: Normal sinus rhythm (08/07 0800) Resp:  [12-31] 21 (08/07 0800) BP: (75-126)/(43-76) 126/58 (08/07 0800) SpO2:  [88 %-100 %] 98 % (08/07 0800) Arterial Line BP: (73-136)/(31-58) 119/49 (08/07 0800) FiO2 (%):  [40 %-50 %] 40 % (08/06 1700) Weight:  [92.1 kg] 92.1 kg (08/07 0600)  Filed Weights   01/25/19 0611 01/26/19 0600  Weight: 92 kg 92.1 kg    Weight change: 0.065 kg   Hemodynamic parameters for last 24 hours: PAP: (32-55)/(11-26) 48/16 CO:  [3.3 L/min-6.8 L/min] 6.8 L/min CI:  [1.6 L/min/m2-3.2 L/min/m2] 3.2 L/min/m2  Intake/Output from previous day: 08/06 0701 - 08/07 0700 In: 5978.8 [I.V.:4618.6; Blood:515; IV Piggyback:845.1] Out: 5188 [Urine:1645; Blood:800; Chest Tube:810]  Intake/Output this shift: Total I/O In: 323.7 [P.O.:120; I.V.:115.4; IV Piggyback:88.3] Out: 130 [Urine:60; Chest Tube:70]  Current Meds: Scheduled Meds: . acetaminophen  1,000 mg Oral Q6H   Or  . acetaminophen (TYLENOL) oral liquid 160 mg/5 mL  1,000 mg Per Tube Q6H  . aspirin EC  325 mg Oral Daily   Or  . aspirin  324 mg Per Tube Daily  . atorvastatin  10 mg Oral Daily  . bisacodyl  10 mg Oral Daily   Or  . bisacodyl  10 mg Rectal Daily  . docusate sodium  200 mg Oral Daily  . insulin aspart  0-24 Units Subcutaneous Q4H  . mouth rinse  15 mL Mouth Rinse BID  . metoprolol tartrate  12.5 mg Oral BID    Or  . metoprolol tartrate  12.5 mg Per Tube BID  . [START ON 01/27/2019] pantoprazole  40 mg Oral Daily  . sodium chloride flush  3 mL Intravenous Q12H   Continuous Infusions: . sodium chloride Stopped (01/26/19 0732)  . sodium chloride    . sodium chloride 10 mL/hr at 01/25/19 1412  . albumin human 12.5 g (01/25/19 1538)  . cefUROXime (ZINACEF)  IV 200 mL/hr at 01/26/19 0800  . dexmedetomidine (PRECEDEX) IV infusion 0 mcg/kg/hr (01/25/19 1645)  . DOPamine 3 mcg/kg/min (01/26/19 0800)  . famotidine (PEPCID) IV Stopped (01/25/19 1427)  . lactated ringers    . lactated ringers 20 mL/hr at 01/26/19 0800  . lactated ringers    . milrinone 0.3 mcg/kg/min (01/26/19 0800)  . nitroGLYCERIN Stopped (01/25/19 1542)  . norepinephrine (LEVOPHED) Adult infusion 9 mcg/min (01/26/19 0800)  . phenylephrine (NEO-SYNEPHRINE) Adult infusion 5 mcg/min (01/26/19 0800)   PRN Meds:.sodium chloride, albumin human, lactated ringers, LORazepam, metoprolol tartrate, morphine injection, ondansetron (ZOFRAN) IV, oxyCODONE, sodium chloride flush, traMADol  General appearance: alert, cooperative and no distress Heart: regular rate and rhythm, S1, S2 normal, no murmur, click, rub or gallop Lungs: clear to auscultation bilaterally Abdomen: soft, non-tender; bowel sounds normal; no masses,  no organomegaly Extremities: extremities  normal, atraumatic, no cyanosis or edema Wound: clean and dry  Lab Results: CBC: Recent Labs    01/25/19 2009 01/26/19 0356  WBC 16.1* 13.9*  HGB 8.8* 8.1*  HCT 26.5* 24.3*  PLT 111* 105*   BMET:  Recent Labs    01/25/19 2009 01/26/19 0356  NA 138 135  K 4.1 4.0  CL 107 107  CO2 21* 19*  GLUCOSE 148* 107*  BUN 34* 34*  CREATININE 1.43* 1.39*  CALCIUM 9.0 8.9    CMET: Lab Results  Component Value Date   WBC 13.9 (H) 01/26/2019   HGB 8.1 (L) 01/26/2019   HCT 24.3 (L) 01/26/2019   PLT 105 (L) 01/26/2019   GLUCOSE 107 (H) 01/26/2019   CHOL 111 08/09/2018   TRIG  50.0 08/09/2018   HDL 36.20 (L) 08/09/2018   LDLCALC 65 08/09/2018   ALT 12 01/23/2019   AST 26 01/23/2019   NA 135 01/26/2019   K 4.0 01/26/2019   CL 107 01/26/2019   CREATININE 1.39 (H) 01/26/2019   BUN 34 (H) 01/26/2019   CO2 19 (L) 01/26/2019   TSH 2.61 03/21/2018   PSA 4.15 (H) 02/08/2014   INR 1.7 (H) 01/25/2019   HGBA1C 5.9 (H) 01/23/2019   MICROALBUR 34.6 (H) 08/09/2018      PT/INR:  Recent Labs    01/25/19 1354  LABPROT 19.6*  INR 1.7*   Radiology: Dg Chest Port 1 View  Result Date: 01/25/2019 CLINICAL DATA:  Post AVR EXAM: PORTABLE CHEST 1 VIEW COMPARISON:  Portable exam 1345 hours compared to 01/23/2019 FINDINGS: Tip of endotracheal tube projects 4.9 cm above carina. RIGHT jugular Swan-Ganz catheter with tip projecting over RIGHT pulmonary artery at hilum. Mediastinal drains and BILATERAL thoracostomy tubes present. No nasogastric tube seen. Normal heart size post AVR. Probable mitral annular calcification. Minimal LEFT basilar atelectasis. Lungs otherwise clear. No infiltrate, pleural effusion, or pneumothorax. Bones demineralized. IMPRESSION: Postsurgical changes as above. Electronically Signed   By: Lavonia Dana M.D.   On: 01/25/2019 14:00     Assessment/Plan: S/P Procedure(s) (LRB): CORONARY ARTERY BYPASS GRAFTING (CABG) times three (N/A) AORTIC VALVE REPLACEMENT (AVR) 59mm Inspiris valve (N/A) TRANSESOPHAGEAL ECHOCARDIOGRAM (TEE) (N/A)   1. CV- NSR and hemodynamically stable. Remains on dopamine, levo, and milrinone. 2. Pulm-Extubated and tolerating 2L [place]. Continue to encourage incentive spirometer. Small bilateral pleural effusions and atelectasis on CXR.  3. Renal-creatinine 1.39. Electrolytes okay.  4. H and H 8.1/24.3, expected acute blood loss anemia. 5. Endo-blood glucose well controlled. Will transition off insulin gtt and start SSI and Levemir.  6. Can remove swan-ganz catheter. Keep A-line until vasoactive drips weaned. OOB to chair.   Plan:  Weaning Levo as tolerated. OOB to chair. Keep chest tubes for now. Working on [employer].     Elgie Collard 01/26/2019 8:23 AM    Chart reviewed, patient examined, agree with above. He is hemodynamically stable with good cardiac index on milrinone, dopamine. Still on NE and neo to support BP. Will wean off milrinone since it may be vasodilating him some. Hgb 8.1 this am so will give him a unit of PRBC's which will also help with weaning vasopressors. Hold Lopressor until stable off pressors.  Chest tube output still bloody but thin. Will keep in for now and probably remove in the am. OOB once swan removed.

## 2019-01-26 NOTE — Progress Notes (Signed)
EVENING ROUNDS NOTE :     [address]       [place] [address]             [phone]                 1 Day Post-Op Procedure(s) (LRB): CORONARY ARTERY BYPASS GRAFTING (CABG) times three (N/A) AORTIC VALVE REPLACEMENT (AVR) 33mm Inspiris valve (N/A) TRANSESOPHAGEAL ECHOCARDIOGRAM (TEE) (N/A)   Total Length of Stay:  LOS: 1 day  Events:  Doing well.  Weaning drips    BP (!) 82/52   Pulse 68   Temp 98 F (36.7 C) (Oral)   Resp 16   Ht 5\' 11"  (1.803 m)   Wt 92.1 kg   SpO2 99%   BMI 28.32 kg/m   PAP: (33-61)/(11-24) 46/17 CO:  [4.6 L/min-6.8 L/min] 4.6 L/min CI:  [2.2 L/min/m2-3.2 L/min/m2] 2.2 L/min/m2     . sodium chloride Stopped (01/26/19 1411)  . sodium chloride    . sodium chloride 10 mL/hr at 01/25/19 1412  . cefUROXime (ZINACEF)  IV Stopped (01/26/19 0803)  . DOPamine 2 mcg/kg/min (01/26/19 1500)  . lactated ringers 20 mL/hr at 01/26/19 1500  . lactated ringers    . milrinone Stopped (01/26/19 1250)  . nitroGLYCERIN Stopped (01/25/19 1542)  . norepinephrine (LEVOPHED) Adult infusion 2 mcg/min (01/26/19 1500)  . phenylephrine (NEO-SYNEPHRINE) Adult infusion Stopped (01/26/19 0801)    I/O last 3 completed shifts: In: 5978.8 [I.V.:4618.6; Blood:515; IV Piggyback:845.1] Out: 9826 [EBRAX:0940; Blood:800; Chest Tube:810]   CBC Latest Ref Rng & Units 01/26/2019 01/25/2019 01/25/2019  WBC 4.0 - 10.5 K/uL 13.9(H) 16.1(H) -  Hemoglobin 13.0 - 17.0 g/dL 8.1(L) 8.8(L) 8.8(L)  Hematocrit 39.0 - 52.0 % 24.3(L) 26.5(L) 26.0(L)  Platelets 150 - 400 K/uL 105(L) 111(L) -    BMP Latest Ref Rng & Units 01/26/2019 01/25/2019 01/25/2019  Glucose 70 - 99 mg/dL 107(H) 148(H) 144(H)  BUN 8 - 23 mg/dL 34(H) 34(H) 32(H)  Creatinine 0.61 - 1.24 mg/dL 1.39(H) 1.43(H) 1.30(H)  Sodium 135 - 145 mmol/L 135 138 140  Potassium 3.5 - 5.1 mmol/L 4.0 4.1 4.0  Chloride 98 - 111 mmol/L 107 107 106  CO2 22 - 32 mmol/L 19(L) 21(L) -  Calcium 8.9 - 10.3 mg/dL 8.9 9.0 -     ABG    Component Value Date/Time   PHART 7.319 (L) 01/25/2019 1827   PCO2ART 37.3 01/25/2019 1827   PO2ART 67.0 (L) 01/25/2019 1827   HCO3 19.4 (L) 01/25/2019 1827   TCO2 20 (L) 01/25/2019 2007   ACIDBASEDEF 6.0 (H) 01/25/2019 1827   O2SAT 93.0 01/25/2019 1827       [name] Bouillon, MD 01/26/2019 5:46 PM

## 2019-01-26 NOTE — Progress Notes (Signed)
Initial Nutrition Assessment  DOCUMENTATION CODES:   Not applicable  INTERVENTION:  -Discontinue Glucerna while on CL -Boost Breeze po TID, each supplement provides 250 kcal and 9 grams of protein -Pro-stat po BID, each supplement contains 100 kcal and 15 grams protein -Advance diet as tolerated   NUTRITION DIAGNOSIS:   Increased nutrient needs related to wound healing as evidenced by estimated needs, percent weight loss, meal completion < 50%(CL diet).   GOAL:   Patient will meet greater than or equal to 90% of their needs  MONITOR:   PO intake, Diet advancement, Weight trends, Supplement acceptance, Labs, I & O's  REASON FOR ASSESSMENT:   Malnutrition Screening Tool    ASSESSMENT:  RD working remotely.  85 year old male with history of HTN, HLD, Gout, CHF, rt heart cath, CABG x 3, aortic valve replacement with recent [facility] admit on 3/30 for acute systolic CHF. Pt with CAD and severe aortic stenosis; presented 8/6 for CABG and aortic valve replacement.  Per chart review, pt reports feeling good today. Weaning Levo as tolerated and working towards advancing diet.   RD to order Boost Breeze TID to assist with calorie and protein needs, pro-stat to promote wound healing.   Patient eating 40% of CL   I/O: +2723 ml since admit UOP: 1645 ml x 24 hrs  Current wt 92.1 kg (202.6 lb) generalized; non-pitting  Wt history reviewed - trending down Patient noted with 12.54 lb wt loss (5.8%) in the past month; severe for time frame Nutrition Focused Physical Exam to follow  Medications reviewed and include: dopamine, levophed  Labs: reviewed   NUTRITION - FOCUSED PHYSICAL EXAM: Unable to complete at this time   Diet Order:   Diet Order            Diet clear liquid Room service appropriate? Yes; Fluid consistency: Thin  Diet effective now        Diet clear liquid Room service appropriate? Yes; Fluid consistency: Thin  Diet effective now              EDUCATION  NEEDS:   No education needs have been identified at this time  Skin:  Skin Assessment: Skin Integrity Issues: Skin Integrity Issues:: Incisions Incisions: closed; rt leg, abdomen  Last BM:  PTA  Height:   Ht Readings from Last 1 Encounters:  01/25/19 5\' 11"  (0.762 m)    Weight:   Wt Readings from Last 1 Encounters:  01/26/19 92.1 kg    Ideal Body Weight:  78.2 kg  BMI:  Body mass index is 28.32 kg/m.  Estimated Nutritional Needs:   Kcal:  2200-2500  Protein:  125-138  Fluid:  >2L   Lajuan Lines, RD, LDN  After Hours/Weekend Pager: [phone]

## 2019-01-26 NOTE — Discharge Summary (Signed)
[address]       [place] [address]             [phone]   Physician Discharge Summary  Patient ID: [name] [name] MRN: [id number] DOB/AGE: 12/24/1930 85 y.o.  Admit date: 01/25/2019 Discharge date: 01/31/2019  Admission Diagnoses: Patient Active Problem List   Diagnosis Date Noted  . Acute systolic congestive heart failure ([place]) 12/21/2018  . Biventricular failure ([place]) 12/21/2018  . Severe aortic stenosis 12/21/2018  . Acute respiratory failure with hypoxia ([place]) 12/12/2018  . SOB (shortness of breath) 12/11/2018  . Hypoxia 12/11/2018  . Shortness of breath 12/11/2018  . Right shoulder pain 05/08/2015  . Health care maintenance 11/12/2014  . BMI 32.0-32.9,adult 06/18/2014  . Elevated PSA 02/17/2014  . Renal insufficiency 05/21/2012  . Gout 05/21/2012  . Hypertension 05/02/2012  . Hypercholesterolemia 05/02/2012  . Diabetes ([place]) 05/02/2012    Discharge Diagnoses:  Active Problems:   S/P CABG x 3 and Aortic Valve Replacement    Discharged Condition: good  Hospital Course:  As dictated by Nicholes Rough PA-C: On 01/25/2019 [name] [name] underwent a coronary bypass grafting x3 and aortic valve replacement with Dr. Cyndia Bent.  He tolerated the procedure well and was transferred to the surgical ICU for continued care.  He was extubated in a timely manner.  Postop day 1 he remained hemodynamically stable.  He was in normal sinus rhythm.  He remained on dopamine, Levophed, and milrinone.  We were weaning the Levophed as tolerated.  He was on 2 L nasal cannula with good oxygen saturation.  His blood glucose level was well controlled and we transitioned him to sliding scale insulin and Levemir.  We discontinued his Swan-Ganz catheter but kept his arterial line due to his continued need for vasoactive drips.  We also kept his chest tube due to output.  We are working on advancing his diet as tolerated.  Chest tubes were removed on 08/10. He had mildly elevated  creatinine post op. His creatinine upon admission was 1.5. He is on room air. He has been tolerating a diet and has had a bowel movement. He is volume overloaded and is being diuresed. His wounds are clean, dry, and continuing to heal. Epicardial pacing wires were removed on 08/10. Chest tube sutures will be removed in the office after discharge. He is felt surgically stable for discharge today.  Consults: None  Significant Diagnostic Studies:   EXAM: CHEST - 2 VIEW  COMPARISON:  Two days ago  FINDINGS: Chest tubes in place. Right IJ sheath has been removed. Trace pleural fluid. Trace right apical pneumothorax. CABG and aortic valve replacement with stable mild cardiomegaly. Calcified mediastinal lymph nodes.  IMPRESSION: 1. Possible trace right apical pneumothorax. 2. Trace pleural effusions.   Electronically Signed   By: Monte Fantasia M.D.   On: 01/30/2019 09:33   Treatments:  CARDIOVASCULAR SURGERY OPERATIVE NOTE  01/25/2019  Surgeon:  Gaye Pollack, MD  First Assistant: Nicholes Rough, PA-C   Preoperative Diagnosis:  Severe multi-vessel coronary artery disease                                              Severe aortic stenosis and moderate to severe aortic insufficiency  Severe LV systolic dysfunction   Postoperative Diagnosis:  Same   Procedure:  1. Median Sternotomy 2. Extracorporeal circulation 3.   Coronary artery bypass grafting x 3   Left internal mammary graft to the LAD  Sequential SVG to PDA and PL RCA   4.   Endoscopic vein harvest from the right leg 5.   Aortic valve replacement using a 25 mm Edwards Inspiris Resilia valve.  Anesthesia:  General Endotracheal   Discharge Exam: Blood pressure 135/68, pulse 80, temperature 98.5 F (36.9 C), temperature source Oral, resp. rate 17, height 5\' 11"  (1.803 m), weight 95.1 kg, SpO2 96 %.   General appearance:alert, cooperative and  no distress Neurologic:intact Heart:regular rate and rhythm Lungs:Breath sounds clear. Abdomen:Soft and non-tender. Extremities:All well perfused, no LE edema. Wound:The sternotomy and RLE EVH incisions are  intact, dry and well approximated   Disposition:    Allergies as of 01/31/2019   No Known Allergies     Medication List    STOP taking these medications   acetaminophen 500 MG tablet Commonly known as: TYLENOL   aspirin 81 MG tablet Replaced by: aspirin 325 MG EC tablet   torsemide 20 MG tablet Commonly known as: DEMADEX     TAKE these medications   allopurinol 100 MG tablet Commonly known as: ZYLOPRIM TAKE 1 TABLET EVERY DAY   aspirin 325 MG EC tablet Take 1 tablet (325 mg total) by mouth daily. Replaces: aspirin 81 MG tablet   atorvastatin 80 MG tablet Commonly known as: LIPITOR Take 1 tablet (80 mg total) by mouth every morning. What changed:   medication strength  how much to take  when to take this   colchicine 0.6 MG tablet Commonly known as: Colcrys Take 1 tablet (0.6 mg total) by mouth 2 (two) times daily as needed. What changed: reasons to take this   feeding supplement (ENSURE ENLIVE) Liqd Take 237 mLs by mouth 2 (two) times daily between meals.   ferrous sulfate 325 (65 FE) MG tablet Take 1 tablet (325 mg total) by mouth daily with breakfast.   Flovent Diskus 50 MCG/BLIST diskus inhaler Generic drug: fluticasone Inhale 2 puffs into the lungs 2 (two) times daily.   folic acid 1 MG tablet Commonly known as: FOLVITE Take 1 tablet (1 mg total) by mouth daily.   furosemide 40 MG tablet Commonly known as: LASIX Take 1 tablet (40 mg total) by mouth daily.   LORazepam 1 MG tablet Commonly known as: ATIVAN Takes 1/2 to 1 tablet q day prn What changed:   how much to take  how to take this  when to take this  reasons to take this  additional instructions   losartan 25 MG tablet Commonly known as: COZAAR Take 0.5  tablets (12.5 mg total) by mouth daily.   metoprolol succinate 50 MG 24 hr tablet Commonly known as: TOPROL-XL Take 1 tablet (50 mg total) by mouth 2 (two) times a day. Take with or immediately following a meal.   potassium chloride SA 20 MEQ tablet Commonly known as: K-DUR Take 1 tablet (20 mEq total) by mouth daily for 5 days.   traMADol 50 MG tablet Commonly known as: ULTRAM Take 1 tablet (50 mg total) by mouth every 6 (six) hours as needed for moderate pain.     The patient has been discharged on:   1.Beta Blocker:  Yes [ yes  ]  No   [   ]                              If No, reason:  2.Ace Inhibitor/ARB: Yes [  x ]                                     No  [    ]                                     If No, reason:   3.Statin:   Yes [ yes  ]                  No  [   ]                  If No, reason:  4.Ecasa:  Yes  [ yes  ]                  No   [   ]                  If No, reason:   Follow-up Information    Einar Pheasant, MD. Call.   Specialty: Internal Medicine Why: for a follow up appointment regarding further surveillance of HGA1C 5.9 (pre diabetes) Contact information: [address] [place] [address] [phone]        Gaye Pollack, MD Follow up.   Specialty: Cardiothoracic Surgery Why: Your follow-up appointment is on 02/28/2019 at 11:30am with Dr. Cyndia Bent. You will need to arrive at 11:00am for a chest xray located at [facility] which is on the first floor of our building.   Contact information: [place] [address] [phone]        nursing suture removal appointment Follow up.   Why: Your appointment is on 8/17 @ 11am for a suture removal Contact information: Dr. Vivi Martens office       End, Harrell Gave, MD Follow up on 02/08/2019.   Specialty: Cardiology Why: You have been scheduled for a telemedicine visit with Dr. Saunders Revel 02/08/2019 at [facility] will  receive a call 2-3 days prior to your appointment with additional instructions.  Contact information: [place]  [address] [phone]             Signed: Antony Odea PA-C 01/31/2019, 7:58 AM

## 2019-01-27 ENCOUNTER — Inpatient Hospital Stay (HOSPITAL_COMMUNITY): Payer: Medicare HMO

## 2019-01-27 LAB — BPAM RBC
Blood Product Expiration Date: 202008312359
ISSUE DATE / TIME: 202008071114
Unit Type and Rh: 5100

## 2019-01-27 LAB — CBC
HCT: 25.5 % — ABNORMAL LOW (ref 39.0–52.0)
Hemoglobin: 8.4 g/dL — ABNORMAL LOW (ref 13.0–17.0)
MCH: 33.1 pg (ref 26.0–34.0)
MCHC: 32.9 g/dL (ref 30.0–36.0)
MCV: 100.4 fL — ABNORMAL HIGH (ref 80.0–100.0)
Platelets: 78 10*3/uL — ABNORMAL LOW (ref 150–400)
RBC: 2.54 MIL/uL — ABNORMAL LOW (ref 4.22–5.81)
RDW: 15 % (ref 11.5–15.5)
WBC: 9 10*3/uL (ref 4.0–10.5)
nRBC: 0 % (ref 0.0–0.2)

## 2019-01-27 LAB — GLUCOSE, CAPILLARY
Glucose-Capillary: 120 mg/dL — ABNORMAL HIGH (ref 70–99)
Glucose-Capillary: 140 mg/dL — ABNORMAL HIGH (ref 70–99)
Glucose-Capillary: 113 mg/dL — ABNORMAL HIGH (ref 70–99)

## 2019-01-27 LAB — BASIC METABOLIC PANEL
Anion gap: 8 (ref 5–15)
BUN: 35 mg/dL — ABNORMAL HIGH (ref 8–23)
CO2: 20 mmol/L — ABNORMAL LOW (ref 22–32)
Calcium: 8.7 mg/dL — ABNORMAL LOW (ref 8.9–10.3)
Chloride: 103 mmol/L (ref 98–111)
Creatinine, Ser: 1.51 mg/dL — ABNORMAL HIGH (ref 0.61–1.24)
GFR calc Af Amer: 48 mL/min — ABNORMAL LOW (ref >60)
GFR calc non Af Amer: 41 mL/min — ABNORMAL LOW (ref >60)
Glucose, Bld: 126 mg/dL — ABNORMAL HIGH (ref 70–99)
Potassium: 3.9 mmol/L (ref 3.5–5.1)
Sodium: 131 mmol/L — ABNORMAL LOW (ref 135–145)

## 2019-01-27 LAB — TYPE AND SCREEN
ABO/RH(D): O POS
Antibody Screen: NEGATIVE
Unit division: 0

## 2019-01-27 MED ORDER — ENSURE ENLIVE PO LIQD
237.0000 mL | Freq: Two times a day (BID) | ORAL | Status: DC
  Administered 2019-01-27 – 2019-01-30 (×5): 237 mL via ORAL

## 2019-01-27 MED ORDER — METOPROLOL TARTRATE 12.5 MG HALF TABLET
12.5000 mg | ORAL_TABLET | Freq: Two times a day (BID) | ORAL | Status: DC
  Administered 2019-01-27 – 2019-01-28 (×4): 12.5 mg via ORAL
  Filled 2019-01-27 (×3): qty 1

## 2019-01-27 NOTE — Progress Notes (Signed)
      [address]       [place] [address]             [phone]                 2 Days Post-Op Procedure(s) (LRB): CORONARY ARTERY BYPASS GRAFTING (CABG) times three (N/A) AORTIC VALVE REPLACEMENT (AVR) 15mm Inspiris valve (N/A) TRANSESOPHAGEAL ECHOCARDIOGRAM (TEE) (N/A)   Events: Doing well.  Off all drips.  Ambulated this am _______________________________________________________________ Vitals: BP 111/64   Pulse (!) 45   Temp 98.6 F (37 C)   Resp 17   Ht 5\' 11"  (1.803 m)   Wt 97.9 kg   SpO2 100%   BMI 30.10 kg/m   - Neuro: alert NAD  - Cardiovascular: sinus, regular rate  Drips: none.   PAP: (43-61)/(16-24) 46/17 CO:  [4.6 L/min] 4.6 L/min CI:  [2.2 L/min/m2] 2.2 L/min/m2  - Pulm: clear B, ewob  Ss CT output    ABG    Component Value Date/Time   PHART 7.319 (L) 01/25/2019 1827   PCO2ART 37.3 01/25/2019 1827   PO2ART 67.0 (L) 01/25/2019 1827   HCO3 19.4 (L) 01/25/2019 1827   TCO2 20 (L) 01/25/2019 2007   ACIDBASEDEF 6.0 (H) 01/25/2019 1827   O2SAT 93.0 01/25/2019 1827    - Abd: soft - Extremity: trace edema  .Intake/Output      08/07 0701 - 08/08 0700 08/08 0701 - 08/09 0700   P.O. 1080    I.V. (mL/kg) 923.1 (9.4)    Blood 425.8    IV Piggyback 200.1    Total Intake(mL/kg) 2629.1 (26.9)    Urine (mL/kg/hr) 860 (0.4)    Blood     Chest Tube 670    Total Output 1530    Net +1099.1            _______________________________________________________________ Labs: CBC Latest Ref Rng & Units 01/27/2019 01/26/2019 01/26/2019  WBC 4.0 - 10.5 K/uL 9.0 13.2(H) 13.9(H)  Hemoglobin 13.0 - 17.0 g/dL 8.4(L) 9.1(L) 8.1(L)  Hematocrit 39.0 - 52.0 % 25.5(L) 27.1(L) 24.3(L)  Platelets 150 - 400 K/uL 78(L) 98(L) 105(L)   CMP Latest Ref Rng & Units 01/27/2019 01/26/2019 01/26/2019  Glucose 70 - 99 mg/dL 126(H) 167(H) 107(H)  BUN 8 - 23 mg/dL 35(H) 35(H) 34(H)  Creatinine 0.61 - 1.24 mg/dL 1.51(H) 1.67(H) 1.39(H)  Sodium 135 - 145 mmol/L 131(L)  132(L) 135  Potassium 3.5 - 5.1 mmol/L 3.9 4.4 4.0  Chloride 98 - 111 mmol/L 103 103 107  CO2 22 - 32 mmol/L 20(L) 18(L) 19(L)  Calcium 8.9 - 10.3 mg/dL 8.7(L) 8.8(L) 8.9  Total Protein 6.5 - 8.1 g/dL - - -  Total Bilirubin 0.3 - 1.2 mg/dL - - -  Alkaline Phos 38 - 126 U/L - - -  AST 15 - 41 U/L - - -  ALT 0 - 44 U/L - - -    CXR:   _______________________________________________________________  Assessment and Plan: POD 3 s/p AVR CABG.  Doing well  Neuro: pain controlled CV: sinus.  Will increase BB now off drips.  On aspirin and statin Pulm: will keep chest tubes given output Renal: creat trending down.  Will remove foley Will start lasix tomorrow GI: advancing diet Heme: stable ID: afebrile, no leukocytosis Endo: on ssi Dispo: continue ICU care for now. Floor likely tomorrow  Melodie Bouillon, MD 01/27/2019 9:16 AM

## 2019-01-27 NOTE — Progress Notes (Signed)
Patient refuses pro-stat and boost breeze.  States primary care provider recommended him to drink ensure.  Patient requests ensure as that is his preference.  Bobette Mo

## 2019-01-28 ENCOUNTER — Inpatient Hospital Stay (HOSPITAL_COMMUNITY): Payer: Medicare HMO

## 2019-01-28 LAB — BASIC METABOLIC PANEL
Anion gap: 10 (ref 5–15)
BUN: 36 mg/dL — ABNORMAL HIGH (ref 8–23)
CO2: 22 mmol/L (ref 22–32)
Calcium: 8.9 mg/dL (ref 8.9–10.3)
Chloride: 101 mmol/L (ref 98–111)
Creatinine, Ser: 1.55 mg/dL — ABNORMAL HIGH (ref 0.61–1.24)
GFR calc Af Amer: 46 mL/min — ABNORMAL LOW (ref >60)
GFR calc non Af Amer: 40 mL/min — ABNORMAL LOW (ref >60)
Glucose, Bld: 84 mg/dL (ref 70–99)
Potassium: 3.8 mmol/L (ref 3.5–5.1)
Sodium: 133 mmol/L — ABNORMAL LOW (ref 135–145)

## 2019-01-28 LAB — GLUCOSE, CAPILLARY
Glucose-Capillary: 95 mg/dL (ref 70–99)
Glucose-Capillary: 119 mg/dL — ABNORMAL HIGH (ref 70–99)
Glucose-Capillary: 114 mg/dL — ABNORMAL HIGH (ref 70–99)
Glucose-Capillary: 118 mg/dL — ABNORMAL HIGH (ref 70–99)

## 2019-01-28 LAB — CBC
HCT: 24.4 % — ABNORMAL LOW (ref 39.0–52.0)
Hemoglobin: 8 g/dL — ABNORMAL LOW (ref 13.0–17.0)
MCH: 33.1 pg (ref 26.0–34.0)
MCHC: 32.8 g/dL (ref 30.0–36.0)
MCV: 100.8 fL — ABNORMAL HIGH (ref 80.0–100.0)
Platelets: 80 10*3/uL — ABNORMAL LOW (ref 150–400)
RBC: 2.42 MIL/uL — ABNORMAL LOW (ref 4.22–5.81)
RDW: 14.6 % (ref 11.5–15.5)
WBC: 6.7 10*3/uL (ref 4.0–10.5)
nRBC: 0 % (ref 0.0–0.2)

## 2019-01-28 NOTE — Progress Notes (Signed)
      [address]       [place] [address]             [phone]                 3 Days Post-Op Procedure(s) (LRB): CORONARY ARTERY BYPASS GRAFTING (CABG) times three (N/A) AORTIC VALVE REPLACEMENT (AVR) 53mm Inspiris valve (N/A) TRANSESOPHAGEAL ECHOCARDIOGRAM (TEE) (N/A)   Events: Doing well.   _______________________________________________________________ Vitals: BP 135/65 (BP Location: Right Arm)   Pulse 65   Temp 98.6 F (37 C) (Oral)   Resp 18   Ht 5\' 11"  (1.803 m)   Wt 97.9 kg   SpO2 96%   BMI 30.10 kg/m   - Neuro: alert NAD  - Cardiovascular: sinus, regular rate  Drips: none.      - Pulm: clear B, ewob  Ss CT output    ABG    Component Value Date/Time   PHART 7.319 (L) 01/25/2019 1827   PCO2ART 37.3 01/25/2019 1827   PO2ART 67.0 (L) 01/25/2019 1827   HCO3 19.4 (L) 01/25/2019 1827   TCO2 20 (L) 01/25/2019 2007   ACIDBASEDEF 6.0 (H) 01/25/2019 1827   O2SAT 93.0 01/25/2019 1827    - Abd: soft - Extremity: trace edema  .Intake/Output      08/08 0701 - 08/09 0700 08/09 0701 - 08/10 0700   P.O. 600 240   I.V. (mL/kg) 2.7 (0)    Blood     IV Piggyback     Total Intake(mL/kg) 602.7 (6.2) 240 (2.5)   Urine (mL/kg/hr) 765 (0.3)    Stool 0    Chest Tube 530 0   Total Output 1295 0   Net -692.3 +240        Urine Occurrence 1 x    Stool Occurrence 1 x       _______________________________________________________________ Labs: CBC Latest Ref Rng & Units 01/28/2019 01/27/2019 01/26/2019  WBC 4.0 - 10.5 K/uL 6.7 9.0 13.2(H)  Hemoglobin 13.0 - 17.0 g/dL 8.0(L) 8.4(L) 9.1(L)  Hematocrit 39.0 - 52.0 % 24.4(L) 25.5(L) 27.1(L)  Platelets 150 - 400 K/uL 80(L) 78(L) 98(L)   CMP Latest Ref Rng & Units 01/28/2019 01/27/2019 01/26/2019  Glucose 70 - 99 mg/dL 84 126(H) 167(H)  BUN 8 - 23 mg/dL 36(H) 35(H) 35(H)  Creatinine 0.61 - 1.24 mg/dL 1.55(H) 1.51(H) 1.67(H)  Sodium 135 - 145 mmol/L 133(L) 131(L) 132(L)  Potassium 3.5 - 5.1 mmol/L 3.8 3.9  4.4  Chloride 98 - 111 mmol/L 101 103 103  CO2 22 - 32 mmol/L 22 20(L) 18(L)  Calcium 8.9 - 10.3 mg/dL 8.9 8.7(L) 8.8(L)  Total Protein 6.5 - 8.1 g/dL - - -  Total Bilirubin 0.3 - 1.2 mg/dL - - -  Alkaline Phos 38 - 126 U/L - - -  AST 15 - 41 U/L - - -  ALT 0 - 44 U/L - - -    CXR:   _______________________________________________________________  Assessment and Plan: POD 4 s/p AVR CABG.  Doing well  Neuro: pain controlled CV: sinus.  On BB, aspirin and statin Pulm: will split tubes to measure output Renal: creat stable GI: advancing diet Heme: stable ID: afebrile, no leukocytosis Endo: on ssi Dispo: floor today  Melodie Bouillon, MD 01/28/2019 9:08 AM

## 2019-01-29 LAB — BASIC METABOLIC PANEL
Anion gap: 11 (ref 5–15)
BUN: 31 mg/dL — ABNORMAL HIGH (ref 8–23)
CO2: 20 mmol/L — ABNORMAL LOW (ref 22–32)
Calcium: 8.9 mg/dL (ref 8.9–10.3)
Chloride: 105 mmol/L (ref 98–111)
Creatinine, Ser: 1.17 mg/dL (ref 0.61–1.24)
GFR calc Af Amer: >60 mL/min (ref >60)
GFR calc non Af Amer: 56 mL/min — ABNORMAL LOW (ref >60)
Glucose, Bld: 133 mg/dL — ABNORMAL HIGH (ref 70–99)
Potassium: 3.6 mmol/L (ref 3.5–5.1)
Sodium: 136 mmol/L (ref 135–145)

## 2019-01-29 LAB — GLUCOSE, CAPILLARY
Glucose-Capillary: 115 mg/dL — ABNORMAL HIGH (ref 70–99)
Glucose-Capillary: 82 mg/dL (ref 70–99)
Glucose-Capillary: 91 mg/dL (ref 70–99)
Glucose-Capillary: 101 mg/dL — ABNORMAL HIGH (ref 70–99)

## 2019-01-29 MED ORDER — SODIUM CHLORIDE 0.9% FLUSH: 3.0000 mL | INTRAVENOUS | Status: DC | PRN

## 2019-01-29 MED ORDER — MOVING RIGHT ALONG BOOK
Freq: Once | Status: AC
  Administered 2019-01-29: 17:00:00
  Filled 2019-01-29: qty 1

## 2019-01-29 MED ORDER — METOPROLOL TARTRATE 25 MG PO TABS
25.0000 mg | ORAL_TABLET | Freq: Two times a day (BID) | ORAL | Status: DC
  Administered 2019-01-29 – 2019-01-31 (×5): 25 mg via ORAL
  Filled 2019-01-29 (×5): qty 1

## 2019-01-29 MED ORDER — SODIUM CHLORIDE 0.9 % IV SOLN: 250.0000 mL | INTRAVENOUS | Status: DC | PRN

## 2019-01-29 MED ORDER — TRAMADOL HCL 50 MG PO TABS: 50.0000 mg | ORAL_TABLET | Freq: Two times a day (BID) | ORAL | Status: DC | PRN

## 2019-01-29 MED ORDER — FOLIC ACID 1 MG PO TABS
1.0000 mg | ORAL_TABLET | Freq: Every day | ORAL | Status: DC
  Administered 2019-01-29 – 2019-01-31 (×3): 1 mg via ORAL
  Filled 2019-01-29 (×3): qty 1

## 2019-01-29 MED ORDER — FERROUS SULFATE 325 (65 FE) MG PO TABS
325.0000 mg | ORAL_TABLET | Freq: Every day | ORAL | Status: DC
  Administered 2019-01-29 – 2019-01-31 (×3): 325 mg via ORAL
  Filled 2019-01-29 (×3): qty 1

## 2019-01-29 MED ORDER — POTASSIUM CHLORIDE CRYS ER 20 MEQ PO TBCR
20.0000 meq | EXTENDED_RELEASE_TABLET | Freq: Every day | ORAL | Status: DC
  Administered 2019-01-29 – 2019-01-30 (×2): 20 meq via ORAL
  Filled 2019-01-29 (×3): qty 1

## 2019-01-29 MED ORDER — SODIUM CHLORIDE 0.9% FLUSH
3.0000 mL | Freq: Two times a day (BID) | INTRAVENOUS | Status: DC
  Administered 2019-01-29 – 2019-01-31 (×4): 3 mL via INTRAVENOUS

## 2019-01-29 MED ORDER — FUROSEMIDE 40 MG PO TABS
40.0000 mg | ORAL_TABLET | Freq: Every day | ORAL | Status: DC
  Administered 2019-01-29 – 2019-01-31 (×3): 40 mg via ORAL
  Filled 2019-01-29 (×3): qty 1

## 2019-01-29 NOTE — Progress Notes (Addendum)
[facility] DAILY ICU PROGRESS NOTE                   [address]            [place] [address]          [phone]   4 Days Post-Op Procedure(s) (LRB): CORONARY ARTERY BYPASS GRAFTING (CABG) times three (N/A) AORTIC VALVE REPLACEMENT (AVR) 12mm Inspiris valve (N/A) TRANSESOPHAGEAL ECHOCARDIOGRAM (TEE) (N/A)  Total Length of Stay:  LOS: 4 days   Subjective: Patient had small bowel movement. He is sitting in chair this am. He has no specific complaints  Objective: Vital signs in last 24 hours: Temp:  [97.5 F (36.4 C)-98.6 F (37 C)] 98 F (36.7 C) (08/10 0400) Pulse Rate:  [64-80] 80 (08/10 0600) Cardiac Rhythm: Normal sinus rhythm (08/10 0400) Resp:  [10-23] 19 (08/10 0600) BP: (70-135)/(54-74) 115/74 (08/10 0500) SpO2:  [94 %-99 %] 96 % (08/10 0600) Weight:  [97.9 kg] 97.9 kg (08/10 0500)  Filed Weights   01/27/19 0500 01/28/19 0500 01/29/19 0500  Weight: 97.9 kg 97.7 kg 97.9 kg    Weight change: 0.2 kg      Intake/Output from previous day: 08/09 0701 - 08/10 0700 In: 840 [P.O.:840] Out: 1595 [Urine:1275; Chest Tube:320]  Intake/Output this shift: No intake/output data recorded.  Current Meds: Scheduled Meds: . acetaminophen  1,000 mg Oral Q6H   Or  . acetaminophen (TYLENOL) oral liquid 160 mg/5 mL  1,000 mg Per Tube Q6H  . aspirin EC  325 mg Oral Daily   Or  . aspirin  324 mg Per Tube Daily  . atorvastatin  80 mg Oral q morning - 10a  . bisacodyl  10 mg Oral Daily   Or  . bisacodyl  10 mg Rectal Daily  . Chlorhexidine Gluconate Cloth  6 each Topical Daily  . docusate sodium  200 mg Oral Daily  . feeding supplement (ENSURE ENLIVE)  237 mL Oral BID BM  . insulin aspart  0-24 Units Subcutaneous TID WC  . insulin detemir  10 Units Subcutaneous Daily  . mouth rinse  15 mL Mouth Rinse BID  . metoprolol tartrate  12.5 mg Oral BID  . pantoprazole  40 mg Oral Daily  . sodium chloride flush  3 mL Intravenous Q12H   Continuous Infusions: .  sodium chloride Stopped (01/26/19 1411)  . sodium chloride    . sodium chloride 10 mL/hr at 01/25/19 1412  . lactated ringers Stopped (01/26/19 1935)  . lactated ringers     PRN Meds:.sodium chloride, LORazepam, metoprolol tartrate, morphine injection, ondansetron (ZOFRAN) IV, oxyCODONE, sodium chloride flush, traMADol  General appearance: alert, cooperative and no distress Heart: RRR Lungs: Slightly diminished left basilar breath sounds and right is clear Abdomen: Soft, non tender, bowel sounds present Extremities: Mild LE edema Wound: Clean and dry. No signs of wound infection  Lab Results: CBC: Recent Labs    01/27/19 0540 01/28/19 0420  WBC 9.0 6.7  HGB 8.4* 8.0*  HCT 25.5* 24.4*  PLT 78* 80*   BMET:  Recent Labs    01/27/19 0540 01/28/19 0420  NA 131* 133*  K 3.9 3.8  CL 103 101  CO2 20* 22  GLUCOSE 126* 84  BUN 35* 36*  CREATININE 1.51* 1.55*  CALCIUM 8.7* 8.9    CMET: Lab Results  Component Value Date   WBC 6.7 01/28/2019   HGB 8.0 (L) 01/28/2019   HCT 24.4 (L) 01/28/2019   PLT 80 (L)  01/28/2019   GLUCOSE 84 01/28/2019   CHOL 111 08/09/2018   TRIG 50.0 08/09/2018   HDL 36.20 (L) 08/09/2018   LDLCALC 65 08/09/2018   ALT 12 01/23/2019   AST 26 01/23/2019   NA 133 (L) 01/28/2019   K 3.8 01/28/2019   CL 101 01/28/2019   CREATININE 1.55 (H) 01/28/2019   BUN 36 (H) 01/28/2019   CO2 22 01/28/2019   TSH 2.61 03/21/2018   PSA 4.15 (H) 02/08/2014   INR 1.7 (H) 01/25/2019   HGBA1C 5.9 (H) 01/23/2019   MICROALBUR 34.6 (H) 08/09/2018      PT/INR: No results for input(s): LABPROT, INR in the last 72 hours. Radiology: No results found.   Assessment/Plan: S/P Procedure(s) (LRB): CORONARY ARTERY BYPASS GRAFTING (CABG) times three (N/A) AORTIC VALVE REPLACEMENT (AVR) 63mm Inspiris valve (N/A) TRANSESOPHAGEAL ECHOCARDIOGRAM (TEE) (N/A) 1. CV-SR. On Lopressor 12.5 mg bid. No ACE or ARB until creatinine improved. 2. Pulmonary-on room air. Chest tubes  with 320 cc last 24 hours. Hope to remove soon. Will order CXR for am. Encourage incentive spirometer. 3. Volume overload 4. Creatinine yesterday 1.55. Re check in am 5. Expected acute blood loss anemia-H and H yesterday 8 and 24.4. Start Ferrous sulfate and folic acid. 6. Mild thrombocytopenia-platelets yesterday 98,000 7. CBGs 119/114/118. Pre op HGA1C 5.9. He likely has pre diabetes. Will provide nutritional information with discharge instructions. He will need follow up with medical doctor after discharge.  8. Remove EPW 9. Transfer to 4E  Donielle Liston Alba PA-C 01/29/2019 8:06 AM   Doing well Mediastinal and wires out today pleurals out tomorrow Home soon  Lajuana Matte

## 2019-01-29 NOTE — Progress Notes (Signed)
Patient arrived to 4E room 05 at this time. Telemetry applied and CCMD notified. V/s and assessment complete. CHG bath done. Will continue to minitor.  Emelda Fear, RN

## 2019-01-29 NOTE — Plan of Care (Signed)
Continue to monitor

## 2019-01-29 NOTE — Progress Notes (Signed)
Changed LDA in charts to separate pleural chest tube drainage from mediastinal drainage.

## 2019-01-29 NOTE — Progress Notes (Signed)
CARDIAC REHAB PHASE I   Went to walk with pt on @H  and pt was transferring to 4E. Offered to walk with pt on 4E, pt declining stating he has had a busy day and would like some rest. Encouraged pt to walk later this evening. Will follow up tomorrow.  [phone] Rufina Falco, RN BSN 01/29/2019 2:54 PM

## 2019-01-29 NOTE — Discharge Instructions (Signed)
Discharge Instructions:  1. You may shower, please wash incisions daily with soap and water and keep dry.  If you wish to cover wounds with dressing you may do so but please keep clean and change daily.  No tub baths or swimming until incisions have completely healed.  If your incisions become red or develop any drainage please call our office at [phone]  2. No Driving until cleared by our office and you are no longer using narcotic pain medications  3. Monitor your weight daily.. Please use the same scale and weigh at same time... If you gain 3-5 lbs in 48 hours with associated lower extremity swelling, please contact our office at [phone]  4. Fever of 101.5 for at least 24 hours,  please contact our office at [phone]   Prediabetes Eating Plan Prediabetes is a condition that causes blood sugar (glucose) levels to be higher than normal. This increases the risk for developing diabetes. In order to prevent diabetes from developing, your health care provider may recommend a diet and other lifestyle changes to help you:  Control your blood glucose levels.  Improve your cholesterol levels.  Manage your blood pressure. Your health care provider may recommend working with a diet and nutrition specialist (dietitian) to make a meal plan that is best for you. What are tips for following this plan? Lifestyle  Set weight loss goals with the help of your health care team. It is recommended that most people with prediabetes lose 7% of their current body weight.  Exercise for at least 30 minutes at least 5 days a week.  Attend a support group or seek ongoing support from a mental health counselor.  Take over-the-counter and prescription medicines only as told by your health care provider. Reading food labels  Read food labels to check the amount of fat, salt (sodium), and sugar in prepackaged foods. Avoid foods that have: ? Saturated fats. ? Trans fats. ? Added sugars.  Avoid  foods that have more than 300 milligrams (mg) of sodium per serving. Limit your daily sodium intake to less than 2,300 mg each day. Shopping  Avoid buying pre-made and processed foods. Cooking  Cook with olive oil. Do not use butter, lard, or ghee.  Bake, broil, grill, or boil foods. Avoid frying. Meal planning   Work with your dietitian to develop an eating plan that is right for you. This may include: ? Tracking how many calories you take in. Use a food diary, notebook, or mobile application to track what you eat at each meal. ? Using the glycemic index (GI) to plan your meals. The index tells you how quickly a food will raise your blood glucose. Choose low-GI foods. These foods take a longer time to raise blood glucose.  Consider following a Mediterranean diet. This diet includes: ? Several servings each day of fresh fruits and vegetables. ? Eating fish at least twice a week. ? Several servings each day of whole grains, beans, nuts, and seeds. ? Using olive oil instead of other fats. ? Moderate alcohol consumption. ? Eating small amounts of red meat and whole-fat dairy.  If you have high blood pressure, you may need to limit your sodium intake or follow a diet such as the DASH eating plan. DASH is an eating plan that aims to lower high blood pressure. What foods are recommended? The items listed below may not be a complete list. Talk with your dietitian about what dietary choices are best for you. Grains Whole grains, such  as whole-wheat or whole-grain breads, crackers, cereals, and pasta. Unsweetened oatmeal. Bulgur. Barley. Quinoa. Brown rice. Corn or whole-wheat flour tortillas or taco shells. Vegetables Lettuce. Spinach. Peas. Beets. Cauliflower. Cabbage. Broccoli. Carrots. Tomatoes. Squash. Eggplant. Herbs. Peppers. Onions. Cucumbers. Brussels sprouts. Fruits Berries. Bananas. Apples. Oranges. Grapes. Papaya. Mango. Pomegranate. Kiwi. Grapefruit. Cherries. Meats and other  protein foods Seafood. Poultry without skin. Lean cuts of pork and beef. Tofu. Eggs. Nuts. Beans. Dairy Low-fat or fat-free dairy products, such as yogurt, cottage cheese, and cheese. Beverages Water. Tea. Coffee. Sugar-free or diet soda. Seltzer water. Lowfat or no-fat milk. Milk alternatives, such as soy or almond milk. Fats and oils Olive oil. Canola oil. Sunflower oil. Grapeseed oil. Avocado. Walnuts. Sweets and desserts Sugar-free or low-fat pudding. Sugar-free or low-fat ice cream and other frozen treats. Seasoning and other foods Herbs. Sodium-free spices. Mustard. Relish. Low-fat, low-sugar ketchup. Low-fat, low-sugar barbecue sauce. Low-fat or fat-free mayonnaise. What foods are not recommended? The items listed below may not be a complete list. Talk with your dietitian about what dietary choices are best for you. Grains Refined white flour and flour products, such as bread, pasta, snack foods, and cereals. Vegetables Canned vegetables. Frozen vegetables with butter or cream sauce. Fruits Fruits canned with syrup. Meats and other protein foods Fatty cuts of meat. Poultry with skin. Breaded or fried meat. Processed meats. Dairy Full-fat yogurt, cheese, or milk. Beverages Sweetened drinks, such as sweet iced tea and soda. Fats and oils Butter. Lard. Ghee. Sweets and desserts Baked goods, such as cake, cupcakes, pastries, cookies, and cheesecake. Seasoning and other foods Spice mixes with added salt. Ketchup. Barbecue sauce. Mayonnaise. Summary  To prevent diabetes from developing, you may need to make diet and other lifestyle changes to help control blood sugar, improve cholesterol levels, and manage your blood pressure.  Set weight loss goals with the help of your health care team. It is recommended that most people with prediabetes lose 7 percent of their current body weight.  Consider following a Mediterranean diet that includes plenty of fresh fruits and vegetables,  whole grains, beans, nuts, seeds, fish, lean meat, low-fat dairy, and healthy oils. This information is not intended to replace advice given to you by your health care provider. Make sure you discuss any questions you have with your health care provider. Document Released: 10/22/2014 Document Revised: 09/29/2018 Document Reviewed: 08/11/2016 Elsevier Patient Education  [employer].   5. Activity- up as tolerated, please walk at least 3 times per day.  Avoid strenuous activity, no lifting, pushing, or pulling with your arms over 8-10 lbs for a minimum of 6 weeks  6. If any questions or concerns arise, please do not hesitate to contact our office at [phone]

## 2019-01-30 ENCOUNTER — Inpatient Hospital Stay (HOSPITAL_COMMUNITY): Payer: Medicare HMO

## 2019-01-30 LAB — CBC
HCT: 26.1 % — ABNORMAL LOW (ref 39.0–52.0)
Hemoglobin: 8.8 g/dL — ABNORMAL LOW (ref 13.0–17.0)
MCH: 33.5 pg (ref 26.0–34.0)
MCHC: 33.7 g/dL (ref 30.0–36.0)
MCV: 99.2 fL (ref 80.0–100.0)
Platelets: 116 10*3/uL — ABNORMAL LOW (ref 150–400)
RBC: 2.63 MIL/uL — ABNORMAL LOW (ref 4.22–5.81)
RDW: 14.4 % (ref 11.5–15.5)
WBC: 7 10*3/uL (ref 4.0–10.5)
nRBC: 0 % (ref 0.0–0.2)

## 2019-01-30 MED ORDER — POTASSIUM CHLORIDE CRYS ER 20 MEQ PO TBCR
20.0000 meq | EXTENDED_RELEASE_TABLET | Freq: Three times a day (TID) | ORAL | Status: AC
  Administered 2019-01-30 (×2): 20 meq via ORAL
  Filled 2019-01-30 (×2): qty 1

## 2019-01-30 MED FILL — Sodium Chloride IV Soln 0.9%: INTRAVENOUS | Qty: 2000 | Status: AC

## 2019-01-30 MED FILL — Magnesium Sulfate Inj 50%: INTRAMUSCULAR | Qty: 10 | Status: AC

## 2019-01-30 MED FILL — Potassium Chloride Inj 2 mEq/ML: INTRAVENOUS | Qty: 40 | Status: AC

## 2019-01-30 MED FILL — Sodium Bicarbonate IV Soln 8.4%: INTRAVENOUS | Qty: 50 | Status: AC

## 2019-01-30 MED FILL — Mannitol IV Soln 20%: INTRAVENOUS | Qty: 500 | Status: AC

## 2019-01-30 MED FILL — Heparin Sodium (Porcine) Inj 1000 Unit/ML: INTRAMUSCULAR | Qty: 30 | Status: AC

## 2019-01-30 MED FILL — Heparin Sodium (Porcine) Inj 1000 Unit/ML: INTRAMUSCULAR | Qty: 10 | Status: AC

## 2019-01-30 MED FILL — Electrolyte-R (PH 7.4) Solution: INTRAVENOUS | Qty: 4000 | Status: AC

## 2019-01-30 MED FILL — Lidocaine HCl Local Soln Prefilled Syringe 100 MG/5ML (2%): INTRAMUSCULAR | Qty: 5 | Status: AC

## 2019-01-30 NOTE — Progress Notes (Addendum)
5 Days Post-Op Procedure(s) (LRB): CORONARY ARTERY BYPASS GRAFTING (CABG) times three (N/A) AORTIC VALVE REPLACEMENT (AVR) 58mm Inspiris valve (N/A) TRANSESOPHAGEAL ECHOCARDIOGRAM (TEE) (N/A) Subjective: Awake and alert, says he feel well. No new problems or concerns. Making progress with mobility. No BM yet.   Objective: Vital signs in last 24 hours: Temp:  [97.9 F (36.6 C)-98.4 F (36.9 C)] 98.1 F (36.7 C) (08/11 0700) Pulse Rate:  [61-82] 81 (08/11 0505) Cardiac Rhythm: Normal sinus rhythm;Heart block (08/11 0700) Resp:  [11-30] 17 (08/11 0700) BP: (123-169)/(60-82) 144/68 (08/11 0700) SpO2:  [93 %-99 %] 96 % (08/11 0505) Weight:  [96 kg] 96 kg (08/11 0505)     Intake/Output from previous day: 08/10 0701 - 08/11 0700 In: 150 [P.O.:150] Out: 1830 [Urine:1750; Chest Tube:80] Intake/Output this shift: Total I/O In: -  Out: 150 [Urine:150]  General appearance: alert, cooperative and no distress Neurologic: intact Heart: regular rate and rhythm Lungs: Breath sounds clear.  Abdomen: Soft and non-tender. Extremities: All well perfused, trace LE edema.  Wound: The sternotomy incision is intact, dry and well approximated.   Lab Results: Recent Labs    01/28/19 0420 01/30/19 0231  WBC 6.7 7.0  HGB 8.0* 8.8*  HCT 24.4* 26.1*  PLT 80* 116*   BMET:  Recent Labs    01/28/19 0420 01/29/19 0843  NA 133* 136  K 3.8 3.6  CL 101 105  CO2 22 20*  GLUCOSE 84 133*  BUN 36* 31*  CREATININE 1.55* 1.17  CALCIUM 8.9 8.9    PT/INR: No results for input(s): LABPROT, INR in the last 72 hours. ABG    Component Value Date/Time   PHART 7.319 (L) 01/25/2019 1827   HCO3 19.4 (L) 01/25/2019 1827   TCO2 20 (L) 01/25/2019 2007   ACIDBASEDEF 6.0 (H) 01/25/2019 1827   O2SAT 93.0 01/25/2019 1827   CBG (last 3)  Recent Labs    01/29/19 0702 01/29/19 0744 01/29/19 1626  GLUCAP 82 91 101*    Assessment/Plan: S/P Procedure(s) (LRB): CORONARY ARTERY BYPASS GRAFTING (CABG)  times three (N/A) AORTIC VALVE REPLACEMENT (AVR) 19mm Inspiris valve (N/A) TRANSESOPHAGEAL ECHOCARDIOGRAM (TEE) (N/A)  -POD5 CABG x3. Making progress with mobility. Stable SR.  Minimal CT drainage and CXR stable with no unexpected changes, mild LLL ATX.  Remove remaining pleural tubes. Continue working on mobility. Continue ASA and statin.  -Volume excess-Good diuresis but weight still ~4kg above pre-op wt.  Continue IV lasix another day, supplement K+. He was taking torsemide prior to admission  -Expected acute blood loss anemia-Mild, Hct trending up. Recheck in AM.  -History of HTN-BP mildly elevated. Continue metoprolol at 25mg  po BID. Will not resume ARB since creat has only recently normalized. Plan to resume at discharge if appropriate.   -Thrombocytopenia--Plt count recovering.   -DVT PPX-ambulate,  Enoxaparin on hold due to low Plts. Will add tonight.  -Anticipate discharge in AM.    LOS: 5 days    Antony Odea, PA-C [phone] 01/30/2019   Agree with above Looks good Tubes out today Home tomorrow

## 2019-01-30 NOTE — Progress Notes (Signed)
CARDIAC REHAB PHASE I   PRE:  Rate/Rhythm: 71 SR  BP:  Supine:   Sitting: 145/65  Standing:    SaO2: 96%RA  MODE:  Ambulation: 470 ft   POST:  Rate/Rhythm: 101 ST lots of PACs with some sinus beats    83 SR with rest  BP:  Supine:   Sitting: 140/70  Standing:    SaO2: 97%RA 1443-1522 Pt walked 470 ft on RA with gait belt use, rolling walker and asst x 1 with steady gait. Reminded of sternal precautions. Pt would like rolling walker for home use. Notified RN. Pt also with short runs of PACs within the sinus rhythm. Increased with activity. Notified RN. Pt helped get cleaned up from diarrhea earlier. To recliner with call bell. Tolerated well.    Graylon Good, RN BSN  01/30/2019 3:16 PM

## 2019-01-30 NOTE — Progress Notes (Signed)
Chest tubes removed. Patient tolerated well. Will continue to monitor.

## 2019-01-30 NOTE — Plan of Care (Signed)
Continue to monitor

## 2019-01-31 ENCOUNTER — Inpatient Hospital Stay (HOSPITAL_COMMUNITY): Payer: Medicare HMO

## 2019-01-31 LAB — BASIC METABOLIC PANEL
Anion gap: 12 (ref 5–15)
BUN: 19 mg/dL (ref 8–23)
CO2: 20 mmol/L — ABNORMAL LOW (ref 22–32)
Calcium: 8.7 mg/dL — ABNORMAL LOW (ref 8.9–10.3)
Chloride: 107 mmol/L (ref 98–111)
Creatinine, Ser: 1.08 mg/dL (ref 0.61–1.24)
GFR calc Af Amer: >60 mL/min (ref >60)
GFR calc non Af Amer: >60 mL/min (ref >60)
Glucose, Bld: 107 mg/dL — ABNORMAL HIGH (ref 70–99)
Potassium: 4.1 mmol/L (ref 3.5–5.1)
Sodium: 139 mmol/L (ref 135–145)

## 2019-01-31 LAB — CBC
HCT: 26.2 % — ABNORMAL LOW (ref 39.0–52.0)
Hemoglobin: 8.7 g/dL — ABNORMAL LOW (ref 13.0–17.0)
MCH: 33.1 pg (ref 26.0–34.0)
MCHC: 33.2 g/dL (ref 30.0–36.0)
MCV: 99.6 fL (ref 80.0–100.0)
Platelets: 155 10*3/uL (ref 150–400)
RBC: 2.63 MIL/uL — ABNORMAL LOW (ref 4.22–5.81)
RDW: 14.4 % (ref 11.5–15.5)
WBC: 7.2 10*3/uL (ref 4.0–10.5)
nRBC: 0 % (ref 0.0–0.2)

## 2019-01-31 MED ORDER — FUROSEMIDE 40 MG PO TABS: 40.0000 mg | ORAL_TABLET | Freq: Every day | ORAL | 0 refills | Status: DC

## 2019-01-31 MED ORDER — FERROUS SULFATE 325 (65 FE) MG PO TABS: 325.0000 mg | ORAL_TABLET | Freq: Every day | ORAL | 0 refills | Status: DC

## 2019-01-31 MED ORDER — ATORVASTATIN CALCIUM 80 MG PO TABS: 80.0000 mg | ORAL_TABLET | Freq: Every morning | ORAL | 2 refills | Status: DC

## 2019-01-31 MED ORDER — POTASSIUM CHLORIDE CRYS ER 20 MEQ PO TBCR: 20.0000 meq | EXTENDED_RELEASE_TABLET | Freq: Every day | ORAL | 0 refills | Status: DC

## 2019-01-31 MED ORDER — TRAMADOL HCL 50 MG PO TABS: 50.0000 mg | ORAL_TABLET | Freq: Four times a day (QID) | ORAL | 0 refills | Status: DC | PRN

## 2019-01-31 MED ORDER — FOLIC ACID 1 MG PO TABS: 1.0000 mg | ORAL_TABLET | Freq: Every day | ORAL | 0 refills | Status: DC

## 2019-01-31 MED ORDER — ASPIRIN 325 MG PO TBEC: 325.0000 mg | DELAYED_RELEASE_TABLET | Freq: Every day | ORAL | 0 refills | Status: DC

## 2019-01-31 NOTE — TOC Transition Note (Signed)
Transition of Care [facility]) - CM/SW Discharge Note Marvetta Gibbons RN, BSN Transitions of Care Unit 4E- RN Case Manager [phone]   Patient Details  Name: [name] [name] MRN: [id number] Date of Birth: [birth year]  Transition of Care [facility]) CM/SW Contact:  Dawayne Patricia, RN Phone Number: 01/31/2019, 11:15 AM   Clinical Narrative:    Pt stable for transition home today, order placed for RN- call made to Jeneen Rinks with [employer] for RW need- DME- RW to be delivered to room prior to discharge.    Final next level of care: Home/Self Care Barriers to Discharge: No Barriers Identified         Discharge Placement     Home          Discharge Plan and Services                DME Arranged: Walker rolling DME Agency: [employer] Date DME Agency Contacted: 01/31/19 Time DME Agency Contacted: 1000 Representative spoke with at DME Agency: Jeneen Rinks HH Arranged: NA [place] Agency: NA        Social Determinants of Health ([place]) Interventions     Readmission Risk Interventions Readmission Risk Prevention Plan 01/31/2019  Transportation Screening Complete  PCP or Specialist Appt within 5-7 Days Complete  Home Care Screening Complete  Medication Review (RN CM) Complete  Some recent data might be hidden

## 2019-01-31 NOTE — Anesthesia Postprocedure Evaluation (Signed)
Anesthesia Post Note  Patient: [name] [name]  Procedure(s) Performed: CORONARY ARTERY BYPASS GRAFTING (CABG) times three (N/A Chest) AORTIC VALVE REPLACEMENT (AVR) 22mm Inspiris valve (N/A Chest) TRANSESOPHAGEAL ECHOCARDIOGRAM (TEE) (N/A )     Patient location during evaluation: SICU Anesthesia Type: General Level of consciousness: sedated Pain management: pain level controlled Vital Signs Assessment: post-procedure vital signs reviewed and stable Respiratory status: patient remains intubated per anesthesia plan Cardiovascular status: stable Postop Assessment: no apparent nausea or vomiting Anesthetic complications: no    Last Vitals:  Vitals:   01/31/19 0757 01/31/19 0800  BP: (!) 130/54 (!) 130/54  Pulse:    Resp: 17 19  Temp: 37.1 C   SpO2: 97% 96%    Last Pain:  Vitals:   01/31/19 0757  TempSrc: Oral  PainSc:                  Lymon Kidney

## 2019-01-31 NOTE — Progress Notes (Signed)
1020-1045 Pt up in room walking. Stated he is getting a rolling walker for home use. Pt stated ok to educate with him and daughter will read materials at home. Reviewed staying in the tube and sternal precautions. Gave handout. Encouraged IS, walking for ex( guidelines given), daily weights and heart healthy and low sodium food choices. Encouraged 2000 mg sodium restriction. Discussed CRP 2 and pt declined. Wants to just walk on his own for ex. Understanding voiced of ed. Graylon Good RN BSN 01/31/2019 10:45 AM

## 2019-01-31 NOTE — Progress Notes (Signed)
6 Days Post-Op Procedure(s) (LRB): CORONARY ARTERY BYPASS GRAFTING (CABG) times three (N/A) AORTIC VALVE REPLACEMENT (AVR) 86mm Inspiris valve (N/A) TRANSESOPHAGEAL ECHOCARDIOGRAM (TEE) (N/A) Subjective: Feels well. No new problems. He is anxious to return home.   Objective: Vital signs in last 24 hours: Temp:  [97.4 F (36.3 C)-98.5 F (36.9 C)] 98.5 F (36.9 C) (08/12 0300) Pulse Rate:  [70-80] 80 (08/12 0300) Cardiac Rhythm: Normal sinus rhythm (08/11 1905) Resp:  [14-22] 14 (08/12 0300) BP: (111-140)/(61-70) 135/68 (08/12 0300) SpO2:  [96 %-98 %] 96 % (08/12 0300) Weight:  [95.1 kg] 95.1 kg (08/12 0300)     Intake/Output from previous day: 08/11 0701 - 08/12 0700 In: 500 [P.O.:500] Out: 8638 [Urine:1650] Intake/Output this shift: No intake/output data recorded.  General appearance: alert, cooperative and no distress Neurologic: intact Heart: regular rate and rhythm Lungs: Breath sounds clear.  Abdomen: Soft and non-tender. Extremities: All well perfused, no LE edema.  Wound: The sternotomy and RLE EVH incisions are  intact, dry and well approximated  Lab Results: Recent Labs    01/30/19 0231 01/31/19 0324  WBC 7.0 7.2  HGB 8.8* 8.7*  HCT 26.1* 26.2*  PLT 116* 155   BMET:  Recent Labs    01/29/19 0843 01/31/19 0324  NA 136 139  K 3.6 4.1  CL 105 107  CO2 20* 20*  GLUCOSE 133* 107*  BUN 31* 19  CREATININE 1.17 1.08  CALCIUM 8.9 8.7*    PT/INR: No results for input(s): LABPROT, INR in the last 72 hours. ABG    Component Value Date/Time   PHART 7.319 (L) 01/25/2019 1827   HCO3 19.4 (L) 01/25/2019 1827   TCO2 20 (L) 01/25/2019 2007   ACIDBASEDEF 6.0 (H) 01/25/2019 1827   O2SAT 93.0 01/25/2019 1827   CBG (last 3)  Recent Labs    01/29/19 0702 01/29/19 0744 01/29/19 1626  GLUCAP 82 91 101*    Assessment/Plan: S/P Procedure(s) (LRB): CORONARY ARTERY BYPASS GRAFTING (CABG) times three (N/A) AORTIC VALVE REPLACEMENT (AVR) 50mm Inspiris  valve (N/A) TRANSESOPHAGEAL ECHOCARDIOGRAM (TEE) (N/A)  -POD6 CABG x3 and AVR. Excellent progress with mobility. Stable SR with a few PAC's. CXR post CT removal pending.  Continue ASA and statin.  -Volume excess-Good diuresis.  Weight still ~3kg above pre-op wt.  Continue IV lasix after discharge for 5 days, supplement K+.  -Expected acute blood loss anemia-Mild, Hct trending up.  -History of HTN-BP mildly elevated. Continue metoprolol at 25mg  po BID. Creat continues to trend down. Will resume ARB   -Thrombocytopenia--Plt count recovering.   -Plan discharge later today if CXR OK.  Instructions given.     LOS: 6 days    Antony Odea, PA-C [phone] 01/31/2019

## 2019-02-01 ENCOUNTER — Telehealth: Payer: Self-pay | Admitting: Internal Medicine

## 2019-02-01 NOTE — Telephone Encounter (Signed)
Transition Care Management Follow-up Telephone Call  How have you been since you were released from the hospital? Patient says he feels much better, he feels like he can breathe.   Do you understand why you were in the hospital? yes   Do you understand the discharge instrcutions? yes  Items Reviewed:  Medications reviewed: yes  Allergies reviewed: yes  Dietary changes reviewed: yes  Referrals reviewed: yes   Functional Questionnaire:   Activities of Daily Living (ADLs):   He states they are independent in the following: ambulation, bathing and hygiene, feeding, continence, grooming, toileting and dressing States they require assistance with the following: Patient says no assistance needed.   Any transportation issues/concerns?: no   Any patient concerns? yes   Confirmed importance and date/time of follow-up visits scheduled: yes   Confirmed with patient if condition begins to worsen call PCP or go to the ER.  Patient was given the Call-a-Nurse line [phone]: yes

## 2019-02-01 NOTE — Telephone Encounter (Signed)
First attempt for Transitional Care Management call, left message for patient to [place] office , will continue to monitor and attempt TCM. 

## 2019-02-05 ENCOUNTER — Ambulatory Visit (INDEPENDENT_AMBULATORY_CARE_PROVIDER_SITE_OTHER): Payer: Self-pay

## 2019-02-05 ENCOUNTER — Other Ambulatory Visit: Payer: Self-pay

## 2019-02-05 DIAGNOSIS — Z4802 Encounter for removal of sutures: Secondary | ICD-10-CM

## 2019-02-05 NOTE — Progress Notes (Signed)
Patient arrived for nurse visit to remove sutures post- procedure CABG with Dr. Cyndia Bent 01/25/2019.  Sutures removed from mid- abdomen with no signs/ symptoms of infection noted.  Patient tolerated procedure well.  Patient/ family instructed to keep the incision sites clean and dry.  Patient/ family acknowledged instructions given.   Patient instructed to notify office if any signs of infection arise.  Patient acknowledged receipt.  Patient stated that he had "plastic" on the upper, inner portion of his right thigh.  Upon observation, patient had a suture still in place.  Clipped some of suture that was outside of skin.  Advised patient that internal portion should dissolve on it's own, but if not to give the office a return call.  Also had another suture on the [facility] site of right lower leg.  Trimmed suture to prevent from catching on clothes.  Patient advised to watch this site as well.  He acknowledged receipt.

## 2019-02-06 ENCOUNTER — Encounter: Payer: Self-pay | Admitting: Internal Medicine

## 2019-02-06 ENCOUNTER — Ambulatory Visit (INDEPENDENT_AMBULATORY_CARE_PROVIDER_SITE_OTHER): Payer: Medicare HMO | Admitting: Internal Medicine

## 2019-02-06 DIAGNOSIS — Z951 Presence of aortocoronary bypass graft: Secondary | ICD-10-CM

## 2019-02-06 DIAGNOSIS — I2510 Atherosclerotic heart disease of native coronary artery without angina pectoris: Secondary | ICD-10-CM

## 2019-02-06 DIAGNOSIS — I10 Essential (primary) hypertension: Secondary | ICD-10-CM

## 2019-02-06 DIAGNOSIS — E1159 Type 2 diabetes mellitus with other circulatory complications: Secondary | ICD-10-CM | POA: Diagnosis not present

## 2019-02-06 DIAGNOSIS — E785 Hyperlipidemia, unspecified: Secondary | ICD-10-CM | POA: Diagnosis not present

## 2019-02-06 DIAGNOSIS — Z952 Presence of prosthetic heart valve: Secondary | ICD-10-CM | POA: Diagnosis not present

## 2019-02-06 NOTE — Progress Notes (Signed)
Patient ID: [name] [name], male   DOB: 05/14/1931, 85 y.o.   MRN: [id number]   Virtual Visit via telephhone Note  This visit type was conducted due to national recommendations for restrictions regarding the COVID-19 pandemic (e.g. social distancing).  This format is felt to be most appropriate for this patient at this time.  All issues noted in this document were discussed and addressed.  No physical exam was performed (except for noted visual exam findings with Video Visits).   I connected with [name] [name] by telephone and verified that I am speaking with the correct person using two identifiers. Location patient: home Location provider: work  Persons participating in the telephone visit: patient, provider  I discussed the limitations, risks, security and privacy concerns of performing an evaluation and management service by telephone and the availability of in person appointments.  The patient expressed understanding and agreed to proceed.   Reason for visit: hospital follow up  HPI: Pt with history of systolic heart failure secondary to ischemic cardiomyopathy complicated by severe aortic stenosis now s/p CABG (LIMA to LAD and sequential SVG to RPDA and RPL) and prosthetic valve replacement on 01/25/19. Discharged 01/31/19.  Being followed by cardiology and Dr Cyndia Bent.  States he is feeling better.  Some chest wall soreness from the surgery, but overall he feels he is doing well.  Breathing better.  Eating.  Appetite is improving.  No nausea or vomiting.  No abdominal pain.  Bowels moving.  He is walking.  Has f/u with Dr End at the end of this week.   No leg swelling.      ROS: See pertinent positives and negatives per HPI.  Past Medical History:  Diagnosis Date  . Aortic stenosis   . Arthritis    right shoulder  . BPH (benign prostatic hypertrophy)   . Coronary artery disease   . Gout   . Heart murmur   . History of chicken pox   . Hypercholesterolemia   . Hypertension   .  Systolic heart failure (HCC)    ischemic cardiomyopathy  . Wears dentures    full upper and lower    Past Surgical History:  Procedure Laterality Date  . AORTIC VALVE REPLACEMENT N/A 01/25/2019   Procedure: AORTIC VALVE REPLACEMENT (AVR) 53m Inspiris valve;  Surgeon: BGaye Pollack MD;  [facility]  Service: Open Heart Surgery;  Laterality: N/A;  . arm surgery     right arm fx s/p "plate insertion"  . CARDIAC CATHETERIZATION    . CATARACT EXTRACTION W/PHACO Right 11/17/2016   Procedure: CATARACT EXTRACTION PHACO AND INTRAOCULAR LENS PLACEMENT [place]  Right;  Surgeon: BLeandrew Koyanagi MD;  [place]  Service: Ophthalmology;  Laterality: Right;  . CORONARY ARTERY BYPASS GRAFT N/A 01/25/2019   LIMA-LAD and sequential SVG-rPDA-rPL  . DENTAL SURGERY     all teeth extracted  . RIGHT HEART CATH AND CORONARY ANGIOGRAPHY N/A 01/02/2019   Procedure: RIGHT HEART CATH AND CORONARY ANGIOGRAPHY;  Surgeon: ENelva Bush MD;  [facility];  Service: Cardiovascular;  Laterality: N/A;  . TEE WITHOUT CARDIOVERSION N/A 01/25/2019   Procedure: TRANSESOPHAGEAL ECHOCARDIOGRAM (TEE);  Surgeon: BGaye Pollack MD;  [place]  Service: Open Heart Surgery;  Laterality: N/A;    Family History  Problem Relation Age of Onset  . Leukemia Father   . Congestive Heart Failure Mother   . Cancer Daughter        Breast Cancer  . Prostate cancer  Neg Hx   . Colon cancer Neg Hx     SOCIAL HX: reviewed.    Current Outpatient Medications:  .  allopurinol (ZYLOPRIM) 100 MG tablet, TAKE 1 TABLET EVERY DAY (Patient taking differently: Take 100 mg by mouth daily. ), Disp: 90 tablet, Rfl: 3 .  aspirin EC 325 MG EC tablet, Take 1 tablet (325 mg total) by mouth daily., Disp: 30 tablet, Rfl: 0 .  atorvastatin (LIPITOR) 80 MG tablet, Take 1 tablet (80 mg total) by mouth every morning., Disp: 30 tablet, Rfl: 2 .  colchicine (COLCRYS) 0.6 MG tablet, Take 1 tablet (0.6 mg  total) by mouth 2 (two) times daily as needed. (Patient taking differently: Take 0.6 mg by mouth 2 (two) times daily as needed (gout). ), Disp: 60 tablet, Rfl: 0 .  feeding supplement, ENSURE ENLIVE, (ENSURE ENLIVE) LIQD, Take 237 mLs by mouth 2 (two) times daily between meals., Disp: 237 mL, Rfl: 12 .  ferrous sulfate 325 (65 FE) MG tablet, Take 1 tablet (325 mg total) by mouth daily with breakfast., Disp: 30 tablet, Rfl: 0 .  fluticasone (FLOVENT DISKUS) 50 MCG/BLIST diskus inhaler, Inhale 2 puffs into the lungs 2 (two) times daily., Disp: 1 Inhaler, Rfl: 1 .  folic acid (FOLVITE) 1 MG tablet, Take 1 tablet (1 mg total) by mouth daily., Disp: 30 tablet, Rfl: 0 .  LORazepam (ATIVAN) 1 MG tablet, Takes 1/2 to 1 tablet q day prn (Patient taking differently: Take 0.5-1 mg by mouth at bedtime as needed for sleep. ), Disp: 30 tablet, Rfl: 2 .  losartan (COZAAR) 25 MG tablet, Take 0.5 tablets (12.5 mg total) by mouth daily., Disp: 45 tablet, Rfl: 3 .  metoprolol succinate (TOPROL-XL) 50 MG 24 hr tablet, Take 1 tablet (50 mg total) by mouth 2 (two) times a day. Take with or immediately following a meal., Disp: 30 tablet, Rfl: 0 .  traMADol (ULTRAM) 50 MG tablet, Take 1 tablet (50 mg total) by mouth every 6 (six) hours as needed for moderate pain., Disp: 28 tablet, Rfl: 0  EXAM:  GENERAL: alert.  Sounds to be in no acute distress.  Answering questions appropriately.    PSYCH/NEURO: pleasant and cooperative, no obvious depression or anxiety, speech and thought processing grossly intact  ASSESSMENT AND PLAN:  Discussed the following assessment and plan:  Coronary artery disease involving native coronary artery of native heart without angina pectoris Is s/p CABG and AVR.  Doing better.  Feels better.  Breathing better.  Due to follow up with cardiology at the end of this week.  Doing well on current medication regimen.  Follow.  Is walking.  Has f/u with Dr Cyndia Bent 02/2019.    Diabetes Low carb diet and  exercise.  Follow met b and a1c.  Recent a1c 5.9.    Hyperlipidemia LDL goal <70 On lipitor.  Low cholesterol diet and exercise.  Follow lipid panel and liver function tests.    Hypertension Blood pressure has been under good control.  Continue same medication regimen.  Follow pressures.  Follow metabolic panel.    S/P aortic valve replacement Doing well.  Feels better.  Continue f/u with cardiology.    S/P CABG x 3 S/p CABG.  Feeling better.  Breathing better.  Continue risk factor modification.      I discussed the assessment and treatment plan with the patient. The patient was provided an opportunity to ask questions and all were answered. The patient agreed with the plan and demonstrated an understanding  of the instructions.   The patient was advised to call back or seek an in-person evaluation if the symptoms worsen or if the condition fails to improve as anticipated.  I provided 15 minutes of non-face-to-face time during this encounter.   Einar Pheasant, MD

## 2019-02-07 ENCOUNTER — Telehealth: Payer: Self-pay

## 2019-02-07 NOTE — Telephone Encounter (Signed)

## 2019-02-08 ENCOUNTER — Other Ambulatory Visit: Payer: Self-pay

## 2019-02-08 ENCOUNTER — Encounter: Payer: Self-pay | Admitting: Internal Medicine

## 2019-02-08 ENCOUNTER — Telehealth (INDEPENDENT_AMBULATORY_CARE_PROVIDER_SITE_OTHER): Payer: Medicare HMO | Admitting: Internal Medicine

## 2019-02-08 VITALS — Ht 70.5 in | Wt 206.0 lb

## 2019-02-08 DIAGNOSIS — D62 Acute posthemorrhagic anemia: Secondary | ICD-10-CM | POA: Diagnosis not present

## 2019-02-08 DIAGNOSIS — Z952 Presence of prosthetic heart valve: Secondary | ICD-10-CM

## 2019-02-08 DIAGNOSIS — I5022 Chronic systolic (congestive) heart failure: Secondary | ICD-10-CM

## 2019-02-08 DIAGNOSIS — Z951 Presence of aortocoronary bypass graft: Secondary | ICD-10-CM

## 2019-02-08 DIAGNOSIS — I2510 Atherosclerotic heart disease of native coronary artery without angina pectoris: Secondary | ICD-10-CM | POA: Diagnosis not present

## 2019-02-08 DIAGNOSIS — D649 Anemia, unspecified: Secondary | ICD-10-CM | POA: Insufficient documentation

## 2019-02-08 DIAGNOSIS — E785 Hyperlipidemia, unspecified: Secondary | ICD-10-CM | POA: Diagnosis not present

## 2019-02-08 DIAGNOSIS — I350 Nonrheumatic aortic (valve) stenosis: Secondary | ICD-10-CM

## 2019-02-08 LAB — ECHOCARDIOGRAM LIMITED
Height: 70 in
Weight: 3446.4 oz

## 2019-02-08 NOTE — Patient Instructions (Addendum)
PLEASE CALL OUR OFFICE TO SCHEDULE AN APPOINTMENT IF YOU HAVE NOT GOTTEN ONE ALREADY.   Medication Instructions:  Your physician recommends that you continue on your current medications as directed. Please refer to the Current Medication list given to you today.  If you need a refill on your cardiac medications before your next appointment, please call your pharmacy.   Lab work: - None ordered.  If you have labs (blood work) drawn today and your tests are completely normal, you will receive your results only by: Marland Kitchen MyChart Message (if you have MyChart) OR . A paper copy in the mail If you have any lab test that is abnormal or we need to change your treatment, we will call you to review the results.  Testing/Procedures: - None ordered.   Follow-Up: At [facility], you and your health needs are our priority.  As part of our continuing mission to provide you with exceptional heart care, we have created designated Provider Care Teams.  These Care Teams include your primary Cardiologist (physician) and Advanced Practice Providers (APPs -  Physician Assistants and Nurse Practitioners) who all work together to provide you with the care you need, when you need it. You will need a follow up appointment in 2 months IN OFFICE.  You may see DR Harrell Gave END or one of the following Advanced Practice Providers on your designated Care Team:   Murray Hodgkins, NP Christell Faith, PA-C . Marrianne Mood, PA-C

## 2019-02-08 NOTE — Progress Notes (Signed)
Virtual Visit via Telephone Note   This visit type was conducted due to national recommendations for restrictions regarding the COVID-19 Pandemic (e.g. social distancing) in an effort to limit this patient's exposure and mitigate transmission in our community.  Due to his co-morbid illnesses, this patient is at least at moderate risk for complications without adequate follow up.  This format is felt to be most appropriate for this patient at this time.  The patient did not have access to video technology/had technical difficulties with video requiring transitioning to audio format only (telephone).  All issues noted in this document were discussed and addressed.  No physical exam could be performed with this format.  Please refer to the patient's chart for his  consent to telehealth for [facility].   Date:  02/08/2019   ID:  [name] [name], DOB 01-31-1931, MRN [id number]  Patient Location: Home Provider Location: Home  PCP:  [name] Pheasant, MD  Cardiologist:  Nelva Bush, MD Electrophysiologist:  None   Evaluation Performed:  Follow-Up Visit  Chief Complaint: Follow-up CAD, cardiomyopathy, and aortic stenosis  History of Present Illness:    [name] [name] is a 85 y.o. male with man with systolic heart failure secondary to ischemic cardiomyopathy complicated by severe aortic stenosis now status post CABG (LIMA to LAD and sequential SVG to RPDA and RPL) and bioprosthetic aortic valve replacement on 01/25/2019, hypertension, hyperlipidemia, diabetes mellitus, asthma, arthritis, gout, and BPH.  We are speaking today for follow-up of his cardiomyopathy, CAD, and aortic stenosis.  He was discharged from [facility] on 01/31/2019 following uneventful postoperative course.  Today, [name] [name] reports that he is feeling well, even better than before his surgery.  He still has some chest wall soreness but otherwise denies chest pain.  His breathing continues to improve.  He has chronic  ankle edema, which is relatively stable.  His appetite is improving.  He has been trying to walk a few hundred feet couple times a day.  He denies palpitations, lightheadedness, and bleeding.  He is scheduled for follow-up with Dr. Cyndia Bent on September 9.  The patient does not have symptoms concerning for COVID-19 infection (fever, chills, cough, or new shortness of breath).    Past Medical History:  Diagnosis Date  . Aortic stenosis   . Arthritis    right shoulder  . BPH (benign prostatic hypertrophy)   . Coronary artery disease   . Gout   . Heart murmur   . History of chicken pox   . Hypercholesterolemia   . Hypertension   . Systolic heart failure (HCC)    ischemic cardiomyopathy  . Wears dentures    full upper and lower   Past Surgical History:  Procedure Laterality Date  . AORTIC VALVE REPLACEMENT N/A 01/25/2019   Procedure: AORTIC VALVE REPLACEMENT (AVR) 38mm Inspiris valve;  Surgeon: Gaye Pollack, MD;  Location: [place] [facility];  Service: Open Heart Surgery;  Laterality: N/A;  . arm surgery     right arm fx s/p "plate insertion"  . CARDIAC CATHETERIZATION    . CATARACT EXTRACTION W/PHACO Right 11/17/2016   Procedure: CATARACT EXTRACTION PHACO AND INTRAOCULAR LENS PLACEMENT ([place])  Right;  Surgeon: Leandrew Koyanagi, MD;  Location: [place];  Service: Ophthalmology;  Laterality: Right;  . CORONARY ARTERY BYPASS GRAFT N/A 01/25/2019   LIMA-LAD and sequential SVG-rPDA-rPL  . DENTAL SURGERY     all teeth extracted  . RIGHT HEART CATH AND CORONARY ANGIOGRAPHY N/A 01/02/2019   Procedure: RIGHT HEART  CATH AND CORONARY ANGIOGRAPHY;  Surgeon: Nelva Bush, MD;  Location: [place] [facility];  Service: Cardiovascular;  Laterality: N/A;  . TEE WITHOUT CARDIOVERSION N/A 01/25/2019   Procedure: TRANSESOPHAGEAL ECHOCARDIOGRAM (TEE);  Surgeon: Gaye Pollack, MD;  Location: [place];  Service: Open Heart Surgery;  Laterality: N/A;     Current Meds  Medication Sig  . allopurinol  (ZYLOPRIM) 100 MG tablet TAKE 1 TABLET EVERY DAY (Patient taking differently: Take 100 mg by mouth daily. )  . aspirin EC 325 MG EC tablet Take 1 tablet (325 mg total) by mouth daily.  Marland Kitchen atorvastatin (LIPITOR) 80 MG tablet Take 1 tablet (80 mg total) by mouth every morning.  . colchicine (COLCRYS) 0.6 MG tablet Take 1 tablet (0.6 mg total) by mouth 2 (two) times daily as needed. (Patient taking differently: Take 0.6 mg by mouth 2 (two) times daily as needed (gout). )  . feeding supplement, ENSURE ENLIVE, (ENSURE ENLIVE) LIQD Take 237 mLs by mouth 2 (two) times daily between meals.  . ferrous sulfate 325 (65 FE) MG tablet Take 1 tablet (325 mg total) by mouth daily with breakfast.  . fluticasone (FLOVENT DISKUS) 50 MCG/BLIST diskus inhaler Inhale 2 puffs into the lungs 2 (two) times daily.  . folic acid (FOLVITE) 1 MG tablet Take 1 tablet (1 mg total) by mouth daily.  Marland Kitchen LORazepam (ATIVAN) 1 MG tablet Takes 1/2 to 1 tablet q day prn (Patient taking differently: Take 0.5-1 mg by mouth at bedtime as needed for sleep. )  . losartan (COZAAR) 25 MG tablet Take 0.5 tablets (12.5 mg total) by mouth daily.  . metoprolol succinate (TOPROL-XL) 50 MG 24 hr tablet Take 1 tablet (50 mg total) by mouth 2 (two) times a day. Take with or immediately following a meal.  . traMADol (ULTRAM) 50 MG tablet Take 1 tablet (50 mg total) by mouth every 6 (six) hours as needed for moderate pain.     Allergies:   Patient has no known allergies.   Social History   Tobacco Use  . Smoking status: Never Smoker  . Smokeless tobacco: Current User    Types: Chew  Substance Use Topics  . Alcohol use: No    Alcohol/week: 0.0 standard drinks  . Drug use: No     Family Hx: The patient's family history includes Cancer in his daughter; Congestive Heart Failure in his mother; Leukemia in his father. There is no history of Prostate cancer or Colon cancer.  ROS:   Please see the history of present illness.   All other systems  reviewed and are negative.   Prior CV studies:   The following studies were reviewed today:  LHC/RHC (01/02/2019): 1. Significant two-vessel coronary artery disease, including 80% ostial LAD stenosis and 80-90% stenosis of large RCA continuation. 2. Mild to moderate disease noted the proximal/mid LAD, RCA, and ramus intermedius. 3. Mildly elevated right heart filling pressures. 4. Moderately elevated left heart filling pressures. 5. Severe pulmonary hypertension. 6. Moderately reduced Fick cardiac output/index.  TTE (12/12/2018):  1. Severe hypokinesis of the left ventricular, entire anterior wall, anterolateral wall and apical segment.  2. There is dyskinesis of the left ventricular, basal inferior wall.  3. The left ventricle has moderate-severely reduced systolic function, with an ejection fraction of 30-35%. The cavity size was normal. There is mildly increased left ventricular wall thickness. Left ventricular diastolic Doppler parameters are  consistent with restrictive filling. Elevated mean left atrial pressure.  4. Left ventricular apical thrombus cannot be  excluded. Consider limited echo with Definity or cardiac MRI for further evaluation, as clinically indicated.  5. Large pleural effusion in the left lateral region.  6. The mitral valve is degenerative. Mild thickening of the mitral valve leaflet. Mitral valve regurgitation is moderate by color flow Doppler.  7. The tricuspid valve is grossly normal.  8. The aortic valve has an indeterminate number of cusps. Severely thickening of the aortic valve. Moderate calcification of the aortic valve. Aortic valve regurgitation is moderate by color flow Doppler. Severe stenosis of the aortic valve.  9. The interatrial septum was not well visualized.  Labs/Other Tests and Data Reviewed:    EKG:  No ECG reviewed.  Recent Labs: 03/21/2018: TSH 2.61 12/13/2018: B Natriuretic Peptide 3,136.0 01/23/2019: ALT 12 01/26/2019: Magnesium 2.6  01/31/2019: BUN 19; Creatinine, Ser 1.08; Hemoglobin 8.7; Platelets 155; Potassium 4.1; Sodium 139   Recent Lipid Panel Lab Results  Component Value Date/Time   CHOL 111 08/09/2018 08:08 AM   TRIG 50.0 08/09/2018 08:08 AM   HDL 36.20 (L) 08/09/2018 08:08 AM   CHOLHDL 3 08/09/2018 08:08 AM   LDLCALC 65 08/09/2018 08:08 AM    Wt Readings from Last 3 Encounters:  02/08/19 206 lb (93.4 kg)  01/31/19 209 lb 9.6 oz (95.1 kg)  01/23/19 202 lb 14.4 oz (92 kg)     Objective:    Vital Signs:  Ht 5' 10.5" (1.791 m)   Wt 206 lb (93.4 kg)   BMI 29.14 kg/m    VITAL SIGNS:  reviewed  ASSESSMENT & PLAN:    CAD: Mr. Marinos is recovering well following CABG earlier this month for two-vessel CAD.  We will continue current medications for secondary prevention.  Mr. Hark should follow-up as planned with Dr. Cyndia Bent next month.  We will continue ASA 325 mg daily for at least 3 months from time of surgery before considering deescalation to 81 mg daily.  He should proceed with cardiac rehab when cleared by Dr. Cyndia Bent.  Chronic systolic heart failure: Mr. Bolyard reports stable volume status with improving shortness of breath, consistent with NYHA class II-III heart failure symptoms.  We will continue his current regimen of losartan 12.5 mg daily and metoprolol succinate 50 mg twice daily.  We will plan to repeat an echocardiogram 3 months after revascularization to reassess LVEF.  Aortic stenosis status post bioprosthetic aortic valve: Mr. Ziegler has noted symptomatic improvement with less exertional dyspnea status post bioprosthetic aVR.  We discussed the importance of SBE prophylaxis as well as continuation of antiplatelet therapy.  He will need to complete at least 3 months of aspirin 325 mg daily, after which time we can consider de-escalation to 81 mg daily.  Anemia: Secondary to postoperative blood loss.  No bleeding reported today.  Continue ferrous sulfate.  Hyperlipidemia: Continue  high intensity statin therapy for LDL goal less than 70.  COVID-19 Education: The signs and symptoms of COVID-19 were discussed with the patient and how to seek care for testing (follow up with PCP or arrange E-visit).  The importance of social distancing was discussed today.  Time:   Today, I have spent 15 minutes with the patient with telehealth technology discussing the above problems.     Medication Adjustments/Labs and Tests Ordered: Current medicines are reviewed at length with the patient today.  Concerns regarding medicines are outlined above.   Tests Ordered: None.  Medication Changes: None.  Follow Up:  In Person in 2 month(s)  Signed, Nelva Bush, MD  02/08/2019 9:25  AM    [place]

## 2019-02-11 ENCOUNTER — Encounter: Payer: Self-pay | Admitting: Internal Medicine

## 2019-02-11 NOTE — Assessment & Plan Note (Signed)
Doing well.  Feels better.  Continue f/u with cardiology.

## 2019-02-11 NOTE — Assessment & Plan Note (Signed)
Low carb diet and exercise.  Follow met b and a1c.  Recent a1c 5.9.

## 2019-02-11 NOTE — Assessment & Plan Note (Signed)
On lipitor.  Low cholesterol diet and exercise.  Follow lipid panel and liver function tests.   

## 2019-02-11 NOTE — Assessment & Plan Note (Signed)
S/p CABG.  Feeling better.  Breathing better.  Continue risk factor modification.

## 2019-02-11 NOTE — Assessment & Plan Note (Signed)
Blood pressure has been under good control.  Continue same medication regimen.  Follow pressures.  Follow metabolic panel.   

## 2019-02-11 NOTE — Assessment & Plan Note (Signed)
Is s/p CABG and AVR.  Doing better.  Feels better.  Breathing better.  Due to follow up with cardiology at the end of this week.  Doing well on current medication regimen.  Follow.  Is walking.  Has f/u with Dr Cyndia Bent 02/2019.

## 2019-02-14 ENCOUNTER — Telehealth: Payer: Self-pay | Admitting: Internal Medicine

## 2019-02-14 NOTE — Telephone Encounter (Signed)
Colchicine should be fine for him to use.  Nelva Bush, MD [facility] Pager: [phone]

## 2019-02-14 NOTE — Telephone Encounter (Signed)
Patient states he has twisted his knee and it is swollen. States he thinks he has gout and wants to make sure his gout medication doesn't interfere with any of cardiac medications.

## 2019-02-14 NOTE — Telephone Encounter (Signed)
Returned call to patient with update from Dr. Saunders Revel.   Pt verbalized understanding and had no further questions at this time.

## 2019-02-21 ENCOUNTER — Ambulatory Visit (INDEPENDENT_AMBULATORY_CARE_PROVIDER_SITE_OTHER): Payer: Self-pay | Admitting: Surgery

## 2019-02-21 ENCOUNTER — Encounter: Payer: Self-pay | Admitting: Surgery

## 2019-02-21 ENCOUNTER — Other Ambulatory Visit: Payer: Self-pay

## 2019-02-21 ENCOUNTER — Other Ambulatory Visit: Payer: Self-pay | Admitting: *Deleted

## 2019-02-21 ENCOUNTER — Other Ambulatory Visit: Payer: Self-pay | Admitting: Physician Assistant

## 2019-02-21 ENCOUNTER — Ambulatory Visit
Admission: RE | Admit: 2019-02-21 | Discharge: 2019-02-21 | Disposition: A | Discharge status: Home / Self Care | Payer: Medicare HMO | Source: Ambulatory Visit | Attending: Surgery | Admitting: Surgery

## 2019-02-21 VITALS — BP 133/67 | HR 76 | Temp 97.6°F | Resp 20 | Ht 70.5 in | Wt 200.0 lb

## 2019-02-21 DIAGNOSIS — Z951 Presence of aortocoronary bypass graft: Secondary | ICD-10-CM

## 2019-02-21 DIAGNOSIS — Z952 Presence of prosthetic heart valve: Secondary | ICD-10-CM

## 2019-02-21 DIAGNOSIS — J90 Pleural effusion, not elsewhere classified: Secondary | ICD-10-CM | POA: Diagnosis not present

## 2019-02-21 DIAGNOSIS — I2510 Atherosclerotic heart disease of native coronary artery without angina pectoris: Secondary | ICD-10-CM

## 2019-02-21 DIAGNOSIS — I350 Nonrheumatic aortic (valve) stenosis: Secondary | ICD-10-CM

## 2019-02-21 NOTE — Progress Notes (Unsigned)
CX

## 2019-02-21 NOTE — Progress Notes (Signed)
HPI: Patient returns for routine postoperative follow-up having undergone coronary artery bypass graft surgery x3 and aortic valve replacement using a 25 mm on Edwards Inspiris Resilia pericardial valve. The patient's early postoperative recovery while in the hospital was notable for an uncomplicated postoperative course. Since hospital discharge the patient reports that he has been feeling well overall.  His breathing is much better and he was ambulating without chest pain or shortness of breath until recently when he twisted his knee resulting in swelling and redness in the left knee.  He thought that he may have gout which he has had in the past but never in his knee.  This is improving and he is currently using a walker to help with his balance until his knee completely recovers.   Current Outpatient Medications  Medication Sig Dispense Refill  . allopurinol (ZYLOPRIM) 100 MG tablet TAKE 1 TABLET EVERY DAY (Patient taking differently: Take 100 mg by mouth daily. ) 90 tablet 3  . aspirin EC 325 MG EC tablet Take 1 tablet (325 mg total) by mouth daily. 30 tablet 0  . atorvastatin (LIPITOR) 80 MG tablet Take 1 tablet (80 mg total) by mouth every morning. 30 tablet 2  . colchicine (COLCRYS) 0.6 MG tablet Take 1 tablet (0.6 mg total) by mouth 2 (two) times daily as needed. (Patient taking differently: Take 0.6 mg by mouth 2 (two) times daily as needed (gout). ) 60 tablet 0  . feeding supplement, ENSURE ENLIVE, (ENSURE ENLIVE) LIQD Take 237 mLs by mouth 2 (two) times daily between meals. 237 mL 12  . ferrous sulfate 325 (65 FE) MG tablet Take 1 tablet (325 mg total) by mouth daily with breakfast. 30 tablet 0  . fluticasone (FLOVENT DISKUS) 50 MCG/BLIST diskus inhaler Inhale 2 puffs into the lungs 2 (two) times daily. 1 Inhaler 1  . folic acid (FOLVITE) 1 MG tablet Take 1 tablet (1 mg total) by mouth daily. 30 tablet 0  . LORazepam (ATIVAN) 1 MG tablet Takes 1/2 to 1 tablet q day prn (Patient  taking differently: Take 0.5-1 mg by mouth at bedtime as needed for sleep. ) 30 tablet 2  . losartan (COZAAR) 25 MG tablet Take 0.5 tablets (12.5 mg total) by mouth daily. 45 tablet 3  . metoprolol succinate (TOPROL-XL) 50 MG 24 hr tablet Take 1 tablet (50 mg total) by mouth 2 (two) times a day. Take with or immediately following a meal. 30 tablet 0  . traMADol (ULTRAM) 50 MG tablet Take 1 tablet (50 mg total) by mouth every 6 (six) hours as needed for moderate pain. 28 tablet 0   No current facility-administered medications for this visit.     Physical Exam: BP 133/67   Pulse 76   Temp 97.6 F (36.4 C) (Skin)   Resp 20   Ht 5' 10.5" (1.791 m)   Wt 200 lb (90.7 kg)   SpO2 95% Comment: RA  BMI 28.29 kg/m  He looks well. Cardiac exam shows regular rate and rhythm with normal heart sounds.  There is no murmur. Lungs are clear. The chest incision is healing well and the sternum is stable. His leg incision is healing well and there is no peripheral edema.  Diagnostic Tests:  CLINICAL DATA:  Status post CABG  EXAM: CHEST - 2 VIEW  COMPARISON:  01/31/2019  FINDINGS: Status post median sternotomy. Calcified left hilar lymph nodes. Unchanged small left pleural effusion with improved atelectasis at the lung base. Disc degenerative disease of  the thoracic spine.  IMPRESSION: Unchanged small left pleural effusion with improved atelectasis at the left lung base. No new airspace opacity. Status post median sternotomy.   Electronically Signed   By: Eddie Candle M.D.   On: 02/21/2019 10:53   Impression:  Overall Mr. Sauvageau is doing well following his surgery.  I encouraged him to continue walking as much as possible.  I think he can begin cardiac rehab once his knee recovers.  I told him he can return to driving a car but should refrain from lifting anything heavier than 10 pounds for 3 months postoperatively.  Plan:  He is going to continue following up with Dr Nicki Reaper  for his primary care and Dr. Saunders Revel for his cardiology care and will return to see me if he has any problems with his incisions.   Gaye Pollack, MD [facility] [phone]

## 2019-02-22 ENCOUNTER — Other Ambulatory Visit: Payer: Self-pay | Admitting: Physician Assistant

## 2019-02-27 ENCOUNTER — Telehealth: Payer: Self-pay | Admitting: Internal Medicine

## 2019-02-27 NOTE — Telephone Encounter (Signed)
Please review for refill.  The patient recently had CABG x 3 and was given several of these medications with no refills given. Please review and advise what we can send in for the patient.

## 2019-02-27 NOTE — Telephone Encounter (Signed)
°*  STAT* If patient is at the pharmacy, call can be transferred to refill team.   1. Which medications need to be refilled? (please list name of each medication and dose if known) folic acid 1 mg, aspirin EC 325 mg, ferrous sulfate 325 mg,atorvastation 80 mg, losartan 25 mg   2. Which pharmacy/location (including street and city if local pharmacy) is medication to be sent to? [facility] [place]  3. Do they need a 30 day or 90 day supply? 30  Patient was prescribed some of these by Dr. Salli Quarry from open heart surgery. If Dr. Saunders Revel would like to change any medication please make patient aware

## 2019-02-27 NOTE — Telephone Encounter (Signed)
Ok to refill cardiac medications

## 2019-02-28 ENCOUNTER — Ambulatory Visit: Payer: Medicare HMO | Admitting: Surgery

## 2019-02-28 MED ORDER — ATORVASTATIN CALCIUM 80 MG PO TABS: 80.0000 mg | ORAL_TABLET | Freq: Every morning | ORAL | 0 refills | Status: DC

## 2019-02-28 MED ORDER — ASPIRIN 325 MG PO TBEC: 325.0000 mg | DELAYED_RELEASE_TABLET | Freq: Every day | ORAL | 0 refills | Status: DC

## 2019-02-28 MED ORDER — LOSARTAN POTASSIUM 25 MG PO TABS: 12.5000 mg | ORAL_TABLET | Freq: Every day | ORAL | 0 refills | Status: DC

## 2019-03-01 ENCOUNTER — Other Ambulatory Visit: Payer: Self-pay | Admitting: Physician Assistant

## 2019-03-01 MED ORDER — ASPIRIN 325 MG PO TBEC: 325.0000 mg | DELAYED_RELEASE_TABLET | Freq: Every day | ORAL | 2 refills | Status: DC

## 2019-03-01 MED ORDER — ATORVASTATIN CALCIUM 80 MG PO TABS: 80.0000 mg | ORAL_TABLET | Freq: Every morning | ORAL | 1 refills | Status: DC

## 2019-03-01 NOTE — Telephone Encounter (Signed)
Called and discussed patient's medications with him. We reviewed the list in it's entirety. Advised the medications that PCP should refill and the cardiologist would refill. He only needs atorvastatin and aspirin refills at this time. Advised him that he is to continue taking the medications as listed until he sees PCP or cardiologist and if they make any changes at that time. He was appreciative.  He is aware of upcoming appointment dates and times for PCP and cardiologist.  He also asked if I would mail him a list of his medications.  List mailed.

## 2019-03-01 NOTE — Addendum Note (Signed)
Addended by: Vanessa Ralphs on: 03/01/2019 12:39 PM   Modules accepted: Orders

## 2019-03-01 NOTE — Telephone Encounter (Signed)
Patient would like to speak with nurse for a medication clarification on cardiac medicines that were refilled Please call to discuss

## 2019-03-02 MED ORDER — FOLIC ACID 1 MG PO TABS: 1.0000 mg | ORAL_TABLET | Freq: Every day | ORAL | 0 refills | Status: DC

## 2019-03-02 NOTE — Addendum Note (Signed)
Addended by: Vanessa Ralphs on: 03/02/2019 04:48 PM   Modules accepted: Orders

## 2019-03-02 NOTE — Telephone Encounter (Signed)
Patient notified and verbalized understanding. He was appreciative. Rx sent to pharmacy as 90-day as patient requested.

## 2019-03-02 NOTE — Telephone Encounter (Signed)
Patient calling in regarding Folic Acid 1 mg. Patient was prescribed folic acid in the hospital with a 1 month supply. Patient wanting to know if Dr. Saunders Revel feels he should continue medication and if so patient will need the prescription sent in to the pharmacy. Please advise

## 2019-03-02 NOTE — Telephone Encounter (Signed)
Patient did ask his pharmacy to contact his PCP regarding refill for folic acid and the PCP refused the refill. Patient is concerned and wants to make sure he gets his right medications and refills. Advised him that normal this medication would be managed by PCP but since he is having such issues I will see if Dr End will refill for the time being. Patient was appreciative.

## 2019-03-02 NOTE — Telephone Encounter (Signed)
I think it is fine to renew the prescription for folic acid.  I suspect is was started to help with his postop anemia.  We can recheck a CBC when I seem him in the office next month and consider stopping the folic acid at that time if his hemoglobin has recovered.  Nelva Bush, MD [facility] Pager: [phone]

## 2019-03-20 DIAGNOSIS — M1712 Unilateral primary osteoarthritis, left knee: Secondary | ICD-10-CM | POA: Diagnosis not present

## 2019-03-20 DIAGNOSIS — M25562 Pain in left knee: Secondary | ICD-10-CM | POA: Diagnosis not present

## 2019-03-21 LAB — ECHO INTRAOPERATIVE TEE
AV Mean grad: 45 mmHg — NL
Ao-asc: 3.7 cm — NL
Height: 71 in — NL
LVIDD: 5.5 cm — NL
LVOT diameter: 23 mm — NL
MV Vena cont: 0.4 cm — NL
Mean grad: 1 mmHg — NL
P 1/2 time: 374 ms — NL
STJ: 2.5 cm — NL
Sinus: 3.2 cm — NL
TV annulus diam: 4 cm — NL
Weight: 3246.41 [oz_av] — NL

## 2019-03-26 ENCOUNTER — Other Ambulatory Visit: Payer: Self-pay | Admitting: Physician Assistant

## 2019-04-09 NOTE — Progress Notes (Signed)
Follow-up Outpatient Visit Date: 04/11/2019  Primary Care Provider: Einar Pheasant, [place] [id number] [place] [id number]  Chief Complaint: Follow-up coronary artery disease and aortic stenosis  HPI:  Mr. Denault is a 85 y.o. year-old male with history of systolic heart failure secondary to ischemic cardiomyopathy complicated by severe aortic stenosis status post CABG (LIMA to LAD and sequential SVG to RPDA and RPL) and bioprosthetic aortic valve replacement on 01/25/2019, hypertension, hyperlipidemia, diabetes mellitus, asthma, arthritis, gout, and BPH, who presents for follow-up of coronary artery disease, cardiomyopathy, and aortic stenosis.  We last spoke in late August, at which time he was doing well and recovering from his cardiac surgery earlier in the month.  We did not make any medication changes at that time.  Today, Mr. Ure reports that he feels relatively well.  He has fatigue some days and wonders when he will begin to feel consistently better following his cardiac surgery in August.  He is trying to walk some around his property, though this was limited for a few weeks after a knee injury.  He denies chest pain, shortness of breath, palpitations, lightheadedness, and edema.  He is tolerating his medications well.  He has not been contacted about cardiac rehab.  He wonders when he can begin to start lifting more than 10 pounds.  --------------------------------------------------------------------------------------------------  Past Medical History:  Diagnosis Date  . Aortic stenosis   . Arthritis    right shoulder  . BPH (benign prostatic hypertrophy)   . Coronary artery disease   . Gout   . Heart murmur   . History of chicken pox   . Hypercholesterolemia   . Hypertension   . Systolic heart failure (HCC)    ischemic cardiomyopathy  . Wears dentures    full upper and lower   Past Surgical History:  Procedure Laterality Date  . AORTIC VALVE  REPLACEMENT N/A 01/25/2019   Procedure: AORTIC VALVE REPLACEMENT (AVR) 19mm Inspiris valve;  Surgeon: Gaye Pollack, MD;  Location: [facility];  Service: Open Heart Surgery;  Laterality: N/A;  . arm surgery     right arm fx s/p "plate insertion"  . CARDIAC CATHETERIZATION    . CATARACT EXTRACTION W/PHACO Right 11/17/2016   Procedure: CATARACT EXTRACTION PHACO AND INTRAOCULAR LENS PLACEMENT ([place])  Right;  Surgeon: Leandrew Koyanagi, MD;  Location: [facility];  Service: Ophthalmology;  Laterality: Right;  . CORONARY ARTERY BYPASS GRAFT N/A 01/25/2019   LIMA-LAD and sequential SVG-[id number]-rPL  . DENTAL SURGERY     all teeth extracted  . RIGHT HEART CATH AND CORONARY ANGIOGRAPHY N/A 01/02/2019   Procedure: RIGHT HEART CATH AND CORONARY ANGIOGRAPHY;  Surgeon: Nelva Bush, MD;  Location: [facility];  Service: Cardiovascular;  Laterality: N/A;  . TEE WITHOUT CARDIOVERSION N/A 01/25/2019   Procedure: TRANSESOPHAGEAL ECHOCARDIOGRAM (TEE);  Surgeon: Gaye Pollack, MD;  Location: [facility];  Service: Open Heart Surgery;  Laterality: N/A;    Current Meds  Medication Sig  . allopurinol (ZYLOPRIM) 100 MG tablet TAKE 1 TABLET EVERY DAY (Patient taking differently: Take 100 mg by mouth daily. )  . aspirin 325 MG EC tablet Take 1 tablet (325 mg total) by mouth daily.  Marland Kitchen atorvastatin (LIPITOR) 80 MG tablet Take 1 tablet (80 mg total) by mouth every morning.  . colchicine (COLCRYS) 0.6 MG tablet Take 1 tablet (0.6 mg total) by mouth 2 (two) times daily as needed. (Patient taking differently: Take 0.6 mg by mouth 2 (two) times daily as  needed (gout). )  . ferrous sulfate 325 (65 FE) MG tablet Take 1 tablet (325 mg total) by mouth daily with breakfast.  . folic acid (FOLVITE) 1 MG tablet Take 1 tablet (1 mg total) by mouth daily.  Marland Kitchen LORazepam (ATIVAN) 1 MG tablet Takes 1/2 to 1 tablet q day prn (Patient taking differently: Take 0.5-1 mg by mouth at bedtime as needed for sleep. )  . losartan (COZAAR)  25 MG tablet Take 0.5 tablets (12.5 mg total) by mouth daily.  . metoprolol succinate (TOPROL-XL) 50 MG 24 hr tablet Take 1 tablet (50 mg total) by mouth 2 (two) times a day. Take with or immediately following a meal.  . traMADol (ULTRAM) 50 MG tablet Take 1 tablet (50 mg total) by mouth every 6 (six) hours as needed for moderate pain.    Allergies: Patient has no known allergies.  Social History   Tobacco Use  . Smoking status: Never Smoker  . Smokeless tobacco: Current User    Types: Chew  Substance Use Topics  . Alcohol use: No    Alcohol/week: 0.0 standard drinks  . Drug use: No    Family History  Problem Relation Age of Onset  . Leukemia Father   . Congestive Heart Failure Mother   . Cancer Daughter        Breast Cancer  . Prostate cancer Neg Hx   . Colon cancer Neg Hx     Review of Systems: Mr. Massett has noted constipation and black stools; he wonders if this could be related to one of his medications.  He has not noticed any bleeding.  Otherwise, a 12-system review of systems was performed and was negative except as noted in the HPI.  --------------------------------------------------------------------------------------------------  Physical Exam: BP 138/80 (BP Location: Left Arm, Patient Position: Sitting, Cuff Size: Normal)   Pulse (!) 57   Ht 5' 10.5" (1.791 m)   Wt 210 lb 4 oz (95.4 kg)   SpO2 98%   BMI 29.74 kg/m   General: NAD. HEENT: No conjunctival pallor or scleral icterus.  Facemask in place. Neck: Supple without lymphadenopathy, thyromegaly, JVD, or HJR.  Lungs: Normal work of breathing. Clear to auscultation bilaterally without wheezes or crackles. Heart: Regular rate and rhythm with 2/6 systolic murmur loudest at the left lower sternal border.  No rubs or gallops. Abd: Bowel sounds present. Soft, NT/ND without hepatosplenomegaly Ext: No lower extremity edema. Radial, PT, and DP pulses are 2+ bilaterally. Skin: Warm and dry without rash.  Median  sternotomy well-healed.  EKG: Normal sinus rhythm with LAFB and LVH.  QRS has narrowed since prior tracing on 01/26/2019 and criteria for LBBB are no longer present.  Lab Results  Component Value Date   WBC 7.2 01/31/2019   HGB 8.7 (L) 01/31/2019   HCT 26.2 (L) 01/31/2019   MCV 99.6 01/31/2019   PLT 155 01/31/2019    Lab Results  Component Value Date   NA 139 01/31/2019   K 4.1 01/31/2019   CL 107 01/31/2019   CO2 20 (L) 01/31/2019   BUN 19 01/31/2019   CREATININE 1.08 01/31/2019   GLUCOSE 107 (H) 01/31/2019   ALT 12 01/23/2019    Lab Results  Component Value Date   CHOL 111 08/09/2018   HDL 36.20 (L) 08/09/2018   LDLCALC 65 08/09/2018   TRIG 50.0 08/09/2018   CHOLHDL 3 08/09/2018    --------------------------------------------------------------------------------------------------  ASSESSMENT AND PLAN: Coronary artery disease without angina: Mr. Feinberg is recovering well from his CABG  and AVR in early August.  I wonder if some of his residual fatigue could be due to excessive beta-blockade.  We will decrease metoprolol when he 5 mg twice daily.  I will also repeat a CBC, given significant postop anemia.  If his hemoglobin has returned to normal or near normal, we will consider stopping iron supplementation, as I suspect this is contributing to his constipation and dark stools.  We will continue secondary prevention with atorvastatin 80 mg daily and aspirin.  We will complete at least 3 months of aspirin 325 mg daily before de-escalating to 81 mg daily.  I will refer Mr. Adragna to cardiac rehab.  Chronic systolic heart failure secondary to ischemic cardiomyopathy: Mr. Vaziri appears euvolemic with NYHA class II symptoms.  As above, we will decrease his metoprolol to 25 mg twice daily, as I worry that some of his fatigue could be related to excessive beta-blockade.  We will continue losartan 25 mg daily.  We will discuss limited echocardiogram to reassess LVEF following  revascularization (previously 35-40%).  Aortic stenosis status post aortic valve replacement: Mr. Turowski is doing well status post bioprosthetic AVR.  We will continue aspirin therapy.  I counseled him on the importance of SBE prophylaxis, though he is edentulous and wears dentures.  Hyperlipidemia: LDL at goal.  Continue atorvastatin 80 mg daily.  Hypertension: Blood pressure is upper normal today.  We will decrease metoprolol, as above.  We will continue losartan 25 mg daily for now, though if blood pressures consistently above 140/90, we may need to escalate this.  Influenza immunization: Seasonal flu vaccine administered today at the patient's request.  Follow-up: Return to clinic in 3 months.  Nelva Bush, MD 04/11/2019 10:40 AM

## 2019-04-11 ENCOUNTER — Encounter: Payer: Self-pay | Admitting: Internal Medicine

## 2019-04-11 ENCOUNTER — Ambulatory Visit (INDEPENDENT_AMBULATORY_CARE_PROVIDER_SITE_OTHER): Payer: Medicare HMO | Admitting: Internal Medicine

## 2019-04-11 ENCOUNTER — Other Ambulatory Visit: Payer: Self-pay

## 2019-04-11 VITALS — BP 138/80 | HR 57 | Ht 70.5 in | Wt 210.2 lb

## 2019-04-11 DIAGNOSIS — I5022 Chronic systolic (congestive) heart failure: Secondary | ICD-10-CM

## 2019-04-11 DIAGNOSIS — I10 Essential (primary) hypertension: Secondary | ICD-10-CM | POA: Diagnosis not present

## 2019-04-11 DIAGNOSIS — Z952 Presence of prosthetic heart valve: Secondary | ICD-10-CM | POA: Diagnosis not present

## 2019-04-11 DIAGNOSIS — Z23 Encounter for immunization: Secondary | ICD-10-CM

## 2019-04-11 DIAGNOSIS — I2510 Atherosclerotic heart disease of native coronary artery without angina pectoris: Secondary | ICD-10-CM | POA: Diagnosis not present

## 2019-04-11 DIAGNOSIS — E785 Hyperlipidemia, unspecified: Secondary | ICD-10-CM | POA: Diagnosis not present

## 2019-04-11 DIAGNOSIS — I350 Nonrheumatic aortic (valve) stenosis: Secondary | ICD-10-CM

## 2019-04-11 MED ORDER — METOPROLOL SUCCINATE ER 25 MG PO TB24: 25.0000 mg | ORAL_TABLET | Freq: Two times a day (BID) | ORAL | 2 refills | Status: DC

## 2019-04-11 MED ORDER — LOSARTAN POTASSIUM 25 MG PO TABS: 25.0000 mg | ORAL_TABLET | Freq: Every day | ORAL | 2 refills | Status: DC

## 2019-04-11 NOTE — Patient Instructions (Signed)
Medication Instructions:  Your physician has recommended you make the following change in your medication:  1- DECREASE Metoprolol to 25 mg by mouth two times a day. 2- INCREASE Losartan to 25 mg by mouth once a day.  *If you need a refill on your cardiac medications before your next appointment, please call your pharmacy*  Lab Work: 1) Your physician recommends that you return for lab work in: [place], [facility].  2) Your physician recommends that you return for lab work in: [place].   ON November 4TH AT [place].  Please go to the [facility]. You will check in at the front desk to the right as you walk into the atrium. Valet Parking is offered if needed.  If you have labs (blood work) drawn today and your tests are completely normal, you will receive your results only by: Marland Kitchen MyChart Message (if you have MyChart) OR . A paper copy in the mail If you have any lab test that is abnormal or we need to change your treatment, we will call you to review the results.  Testing/Procedures: NONE  Follow-Up: At [facility], you and your health needs are our priority.  As part of our continuing mission to provide you with exceptional heart care, we have created designated Provider Care Teams.  These Care Teams include your primary Cardiologist (physician) and Advanced Practice Providers (APPs -  Physician Assistants and Nurse Practitioners) who all work together to provide you with the care you need, when you need it.  Your next appointment:   3 months  The format for your next appointment:   In Person  Provider:    You may see DR Harrell Gave END or one of the following Advanced Practice Providers on your designated Care Team:    Murray Hodgkins, NP  Christell Faith, PA-C  Marrianne Mood, PA-C   Other Instructions Referral to Cardiac Rehab

## 2019-04-12 ENCOUNTER — Encounter: Payer: Self-pay | Admitting: Internal Medicine

## 2019-04-12 LAB — BASIC METABOLIC PANEL
BUN/Creatinine Ratio: 14 (ref 10–24)
BUN: 12 mg/dL (ref 8–27)
CO2: 24 mmol/L (ref 20–29)
Calcium: 9.3 mg/dL (ref 8.6–10.2)
Chloride: 107 mmol/L — ABNORMAL HIGH (ref 96–106)
Creatinine, Ser: 0.88 mg/dL (ref 0.76–1.27)
GFR calc Af Amer: 90 mL/min/{1.73_m2} (ref >59)
GFR calc non Af Amer: 78 mL/min/{1.73_m2} (ref >59)
Glucose: 105 mg/dL — ABNORMAL HIGH (ref 65–99)
Potassium: 4 mmol/L (ref 3.5–5.2)
Sodium: 143 mmol/L (ref 134–144)

## 2019-04-12 LAB — CBC
Hematocrit: 36.4 % — ABNORMAL LOW (ref 37.5–51.0)
Hemoglobin: 12 g/dL — ABNORMAL LOW (ref 13.0–17.7)
MCH: 30.7 pg (ref 26.6–33.0)
MCHC: 33 g/dL (ref 31.5–35.7)
MCV: 93 fL (ref 79–97)
Platelets: 145 10*3/uL — ABNORMAL LOW (ref 150–450)
RBC: 3.91 x10E6/uL — ABNORMAL LOW (ref 4.14–5.80)
RDW: 14.4 % (ref 11.6–15.4)
WBC: 8.9 10*3/uL (ref 3.4–10.8)

## 2019-04-13 ENCOUNTER — Telehealth: Payer: Self-pay | Admitting: Internal Medicine

## 2019-04-13 NOTE — Telephone Encounter (Signed)
Notes recorded by Nelva Bush, MD on 04/12/2019 at 9:08 PM EDT  Please let [name] [name] know that his hemoglobin has improved to 12. I think it is reasonable for him to stop taking ferrous sulfate, given associated constipation. Kidney function and electrolytes are normal.

## 2019-04-13 NOTE — Telephone Encounter (Signed)
I spoke with the patient regarding his lab results and Dr. Darnelle Bos recommendations that he may stop ferrous sulfate.  The patient voiced understanding and was agreeable.

## 2019-04-17 ENCOUNTER — Other Ambulatory Visit: Payer: Self-pay | Admitting: Internal Medicine

## 2019-04-18 ENCOUNTER — Other Ambulatory Visit: Payer: Self-pay

## 2019-04-18 ENCOUNTER — Encounter: Payer: Medicare HMO | Attending: Internal Medicine | Admitting: *Deleted

## 2019-04-18 DIAGNOSIS — Z951 Presence of aortocoronary bypass graft: Secondary | ICD-10-CM

## 2019-04-18 DIAGNOSIS — Z952 Presence of prosthetic heart valve: Secondary | ICD-10-CM

## 2019-04-18 NOTE — Progress Notes (Signed)
Virtual orientation completed.  Documenation for diagnosis can be found in [facility] encounter 01/25/19.  EP eval scheduled for Mon 11/2 at 11am.

## 2019-04-19 ENCOUNTER — Other Ambulatory Visit: Payer: Self-pay

## 2019-04-19 ENCOUNTER — Ambulatory Visit (INDEPENDENT_AMBULATORY_CARE_PROVIDER_SITE_OTHER): Payer: Medicare HMO | Admitting: Internal Medicine

## 2019-04-19 DIAGNOSIS — E785 Hyperlipidemia, unspecified: Secondary | ICD-10-CM

## 2019-04-19 DIAGNOSIS — I10 Essential (primary) hypertension: Secondary | ICD-10-CM

## 2019-04-19 DIAGNOSIS — Z951 Presence of aortocoronary bypass graft: Secondary | ICD-10-CM

## 2019-04-19 DIAGNOSIS — Z952 Presence of prosthetic heart valve: Secondary | ICD-10-CM

## 2019-04-19 DIAGNOSIS — I2510 Atherosclerotic heart disease of native coronary artery without angina pectoris: Secondary | ICD-10-CM | POA: Diagnosis not present

## 2019-04-19 DIAGNOSIS — E1159 Type 2 diabetes mellitus with other circulatory complications: Secondary | ICD-10-CM

## 2019-04-19 MED ORDER — LOSARTAN POTASSIUM 50 MG PO TABS: 50.0000 mg | ORAL_TABLET | Freq: Every day | ORAL | 31 refills | Status: DC

## 2019-04-19 NOTE — Patient Instructions (Signed)
Increase losartan to 50mg  per day.  (you can take two of the 25 mg tablets per day).  I have sent in the prescription for the 50mg  tablets (to your mail order).

## 2019-04-19 NOTE — Progress Notes (Signed)
Patient ID: [name] [name], male   DOB: [birth year], 85 y.o.   MRN: [id number]   Subjective:    Patient ID: [name] [name], male    DOB: 1931/03/26, 85 y.o.   MRN: [id number]  HPI  Patient here for a scheduled follow up.  Has a history of systolic heart failure secondary to ischemic cardiomyopathy complicated by severe aortic stenosis s/p CABG and bioprosthetic aortic valve replacement on 01/25/19.  Doing well s/p surgery.  Breathing is better.  No chest pain.  No cough or congestion.  Discussed cardiac rehab.  Appetite is doing well.  No acid reflux.  No abdominal pain.  Bowels moving better now.  Was having constipation.  Iron stopped.  Bowels better now.  Metoprolol dose decreased.     Past Medical History:  Diagnosis Date  . Aortic stenosis   . Arthritis    right shoulder  . BPH (benign prostatic hypertrophy)   . Coronary artery disease   . Gout   . Heart murmur   . History of chicken pox   . Hypercholesterolemia   . Hypertension   . Systolic heart failure (HCC)    ischemic cardiomyopathy  . Wears dentures    full upper and lower   Past Surgical History:  Procedure Laterality Date  . AORTIC VALVE REPLACEMENT N/A 01/25/2019   Procedure: AORTIC VALVE REPLACEMENT (AVR) 41m Inspiris valve;  Surgeon: BGaye Pollack MD;  [facility];  Service: Open Heart Surgery;  Laterality: N/A;  . arm surgery     right arm fx s/p "plate insertion"  . CARDIAC CATHETERIZATION    . CATARACT EXTRACTION W/PHACO Right 11/17/2016   Procedure: CATARACT EXTRACTION PHACO AND INTRAOCULAR LENS PLACEMENT ([place]  Right;  Surgeon: BLeandrew Koyanagi MD;  [facility] [facility]  Service: Ophthalmology;  Laterality: Right;  . CORONARY ARTERY BYPASS GRAFT N/A 01/25/2019   LIMA-LAD and sequential [id number]  . DENTAL SURGERY     all teeth extracted  . RIGHT HEART CATH AND CORONARY ANGIOGRAPHY N/A 01/02/2019   Procedure: RIGHT HEART CATH AND CORONARY ANGIOGRAPHY;  Surgeon: ENelva Bush MD;   [facility];  Service: Cardiovascular;  Laterality: N/A;  . TEE WITHOUT CARDIOVERSION N/A 01/25/2019   Procedure: TRANSESOPHAGEAL ECHOCARDIOGRAM (TEE);  Surgeon: BGaye Pollack MD;  [place] [facility]  Service: Open Heart Surgery;  Laterality: N/A;   Family History  Problem Relation Age of Onset  . Leukemia Father   . Congestive Heart Failure Mother   . Cancer Daughter        Breast Cancer  . Prostate cancer Neg Hx   . Colon cancer Neg Hx    Social History   Socioeconomic History  . Marital status: Married    Spouse name: Not on file  . Number of children: Not on file  . Years of education: Not on file  . Highest education level: Not on file  Occupational History  . Not on file  Social Needs  . Financial resource strain: Not hard at all  . Food insecurity    Worry: Never true    Inability: Never true  . Transportation needs    Medical: No    Non-medical: No  Tobacco Use  . Smoking status: Never Smoker  . Smokeless tobacco: Current User    Types: Chew  . Tobacco comment: not ready to quit currently  Substance and Sexual Activity  . Alcohol use: No    Alcohol/week: 0.0 standard drinks  . Drug use: No  .  Sexual activity: Not Currently  Lifestyle  . Physical activity    Days per week: 0 days    Minutes per session: Not on file  . Stress: Not at all  Relationships  . Social Herbalist on phone: Not on file    Gets together: Not on file    Attends religious service: Not on file    Active member of club or organization: Not on file    Attends meetings of clubs or organizations: Not on file    Relationship status: Not on file  Other Topics Concern  . Not on file  Social History Narrative  . Not on file    Outpatient Encounter Medications as of 04/19/2019  Medication Sig  . allopurinol (ZYLOPRIM) 100 MG tablet TAKE 1 TABLET EVERY DAY (Patient taking differently: Take 100 mg by mouth daily. )  . aspirin 325 MG EC tablet TAKE 1 TABLET BY  MOUTH EVERY DAY  . colchicine (COLCRYS) 0.6 MG tablet Take 1 tablet (0.6 mg total) by mouth 2 (two) times daily as needed. (Patient taking differently: Take 0.6 mg by mouth 2 (two) times daily as needed (gout). )  . folic acid (FOLVITE) 1 MG tablet Take 1 tablet (1 mg total) by mouth daily.  Marland Kitchen LORazepam (ATIVAN) 1 MG tablet Takes 1/2 to 1 tablet q day prn (Patient taking differently: Take 0.5-1 mg by mouth at bedtime as needed for sleep. )  . losartan (COZAAR) 50 MG tablet Take 1 tablet (50 mg total) by mouth daily.  . metoprolol succinate (TOPROL-XL) 25 MG 24 hr tablet Take 1 tablet (25 mg total) by mouth 2 (two) times daily. Take with or immediately following a meal.  . [DISCONTINUED] atorvastatin (LIPITOR) 80 MG tablet Take 1 tablet (80 mg total) by mouth every morning.  . [DISCONTINUED] losartan (COZAAR) 25 MG tablet Take 1 tablet (25 mg total) by mouth daily.   No facility-administered encounter medications on file as of 04/19/2019.    Review of Systems  Constitutional: Negative for appetite change and unexpected weight change.  HENT: Negative for congestion and sinus pressure.   Respiratory: Negative for cough, chest tightness and shortness of breath.   Cardiovascular: Negative for chest pain and palpitations.       Some ankle swelling.  He reports is better in am.    Gastrointestinal: Negative for abdominal pain, diarrhea, nausea and vomiting.  Genitourinary: Negative for difficulty urinating and dysuria.  Musculoskeletal: Negative for joint swelling and myalgias.  Skin: Negative for color change and rash.  Neurological: Negative for dizziness, light-headedness and headaches.  Psychiatric/Behavioral: Negative for agitation and dysphoric mood.       Objective:    Physical Exam Constitutional:      General: He is not in acute distress.    Appearance: Normal appearance. He is well-developed.  HENT:     Head: Normocephalic and atraumatic.     Right Ear: External ear normal.      Left Ear: External ear normal.  Eyes:     General: No scleral icterus.       Right eye: No discharge.        Left eye: No discharge.     Conjunctiva/sclera: Conjunctivae normal.  Neck:     Musculoskeletal: Neck supple. No muscular tenderness.  Cardiovascular:     Rate and Rhythm: Normal rate and regular rhythm.  Pulmonary:     Effort: Pulmonary effort is normal. No respiratory distress.     Breath sounds: Normal  breath sounds.  Abdominal:     General: Bowel sounds are normal.     Palpations: Abdomen is soft.     Tenderness: There is no abdominal tenderness.  Musculoskeletal:        General: No swelling or tenderness.  Lymphadenopathy:     Cervical: No cervical adenopathy.  Skin:    Findings: No erythema or rash.  Neurological:     Mental Status: He is alert.  Psychiatric:        Mood and Affect: Mood normal.        Behavior: Behavior normal.     BP (!) 178/90   Pulse 85   Temp (!) 97.5 F (36.4 C)   Resp 16   Wt 216 lb 12.8 oz (98.3 kg)   SpO2 97%   BMI 30.67 kg/m  Wt Readings from Last 3 Encounters:  04/19/19 216 lb 12.8 oz (98.3 kg)  04/11/19 210 lb 4 oz (95.4 kg)  02/21/19 200 lb (90.7 kg)     Lab Results  Component Value Date   WBC 8.9 04/11/2019   HGB 12.0 (L) 04/11/2019   HCT 36.4 (L) 04/11/2019   PLT 145 (L) 04/11/2019   GLUCOSE 105 (H) 04/11/2019   CHOL 111 08/09/2018   TRIG 50.0 08/09/2018   HDL 36.20 (L) 08/09/2018   LDLCALC 65 08/09/2018   ALT 12 01/23/2019   AST 26 01/23/2019   NA 143 04/11/2019   K 4.0 04/11/2019   CL 107 (H) 04/11/2019   CREATININE 0.88 04/11/2019   BUN 12 04/11/2019   CO2 24 04/11/2019   TSH 2.61 03/21/2018   PSA 4.15 (H) 02/08/2014   INR 1.7 (H) 01/25/2019   HGBA1C 5.9 (H) 01/23/2019   MICROALBUR 34.6 (H) 08/09/2018    Dg Chest 2 View  Result Date: 02/21/2019 CLINICAL DATA:  Status post CABG EXAM: CHEST - 2 VIEW COMPARISON:  01/31/2019 FINDINGS: Status post median sternotomy. Calcified left hilar lymph nodes.  Unchanged small left pleural effusion with improved atelectasis at the lung base. Disc degenerative disease of the thoracic spine. IMPRESSION: Unchanged small left pleural effusion with improved atelectasis at the left lung base. No new airspace opacity. Status post median sternotomy. Electronically Signed   By: Eddie Candle M.D.   On: 02/21/2019 10:53       Assessment & Plan:   Problem List Items Addressed This Visit    Coronary artery disease involving native coronary artery of native heart without angina pectoris    Is s/p CABG and AVR.  Doing well.  Discussed cardiac rehab.  Continue f/u with cardiology.        Relevant Medications   losartan (COZAAR) 50 MG tablet   Diabetes (HCC)    Low carb diet and exercise.  Follow met b and a1c.        Relevant Medications   losartan (COZAAR) 50 MG tablet   Hyperlipidemia LDL goal <70    On lipitor.  Low cholesterol diet and exercise.  Follow lipid panel and liver function tests.        Relevant Medications   losartan (COZAAR) 50 MG tablet   Hypertension    Blood pressure elevated.  Increase losartan to '50mg'$  q day.  Follow pressures.  Follow metabolic panel.       Relevant Medications   losartan (COZAAR) 50 MG tablet   S/P aortic valve replacement    Doing well s/p surgery.  Breathing better.  Recommend cardiac rehab.  S/P CABG x 3    S/p CABG.  Feeling better.  Cardiac rehab.            Einar Pheasant, MD

## 2019-04-20 ENCOUNTER — Other Ambulatory Visit: Payer: Self-pay | Admitting: Internal Medicine

## 2019-04-20 ENCOUNTER — Other Ambulatory Visit: Payer: Self-pay

## 2019-04-20 MED ORDER — ATORVASTATIN CALCIUM 80 MG PO TABS: 80.0000 mg | ORAL_TABLET | Freq: Every morning | ORAL | 1 refills | Status: DC

## 2019-04-22 ENCOUNTER — Encounter: Payer: Self-pay | Admitting: Internal Medicine

## 2019-04-22 NOTE — Assessment & Plan Note (Signed)
On lipitor.  Low cholesterol diet and exercise.  Follow lipid panel and liver function tests.   

## 2019-04-22 NOTE — Assessment & Plan Note (Signed)
S/p CABG.  Feeling better.  Cardiac rehab.

## 2019-04-22 NOTE — Assessment & Plan Note (Signed)
Low carb diet and exercise.  Follow met b and a1c.   

## 2019-04-22 NOTE — Assessment & Plan Note (Signed)
Is s/p CABG and AVR.  Doing well.  Discussed cardiac rehab.  Continue f/u with cardiology.

## 2019-04-22 NOTE — Assessment & Plan Note (Signed)
Blood pressure elevated.  Increase losartan to 50mg q day.  Follow pressures.  Follow metabolic panel.   

## 2019-04-22 NOTE — Assessment & Plan Note (Signed)
Doing well s/p surgery.  Breathing better.  Recommend cardiac rehab.

## 2019-04-23 ENCOUNTER — Other Ambulatory Visit
Admission: RE | Admit: 2019-04-23 | Discharge: 2019-04-23 | Disposition: A | Discharge status: Home / Self Care | Payer: Medicare HMO | Source: Ambulatory Visit | Attending: Internal Medicine | Admitting: Internal Medicine

## 2019-04-23 ENCOUNTER — Other Ambulatory Visit: Payer: Self-pay

## 2019-04-23 ENCOUNTER — Encounter: Payer: Medicare HMO | Attending: Internal Medicine | Admitting: *Deleted

## 2019-04-23 VITALS — Ht 70.0 in | Wt 214.7 lb

## 2019-04-23 DIAGNOSIS — Z7982 Long term (current) use of aspirin: Secondary | ICD-10-CM | POA: Diagnosis not present

## 2019-04-23 DIAGNOSIS — I350 Nonrheumatic aortic (valve) stenosis: Secondary | ICD-10-CM | POA: Diagnosis not present

## 2019-04-23 DIAGNOSIS — Z952 Presence of prosthetic heart valve: Secondary | ICD-10-CM

## 2019-04-23 DIAGNOSIS — M109 Gout, unspecified: Secondary | ICD-10-CM | POA: Insufficient documentation

## 2019-04-23 DIAGNOSIS — I10 Essential (primary) hypertension: Secondary | ICD-10-CM

## 2019-04-23 DIAGNOSIS — I255 Ischemic cardiomyopathy: Secondary | ICD-10-CM | POA: Insufficient documentation

## 2019-04-23 DIAGNOSIS — I2510 Atherosclerotic heart disease of native coronary artery without angina pectoris: Secondary | ICD-10-CM | POA: Insufficient documentation

## 2019-04-23 DIAGNOSIS — I5020 Unspecified systolic (congestive) heart failure: Secondary | ICD-10-CM | POA: Insufficient documentation

## 2019-04-23 DIAGNOSIS — I110 Hypertensive heart disease with heart failure: Secondary | ICD-10-CM | POA: Diagnosis not present

## 2019-04-23 DIAGNOSIS — Z951 Presence of aortocoronary bypass graft: Secondary | ICD-10-CM | POA: Insufficient documentation

## 2019-04-23 DIAGNOSIS — Z79899 Other long term (current) drug therapy: Secondary | ICD-10-CM | POA: Insufficient documentation

## 2019-04-23 DIAGNOSIS — M19011 Primary osteoarthritis, right shoulder: Secondary | ICD-10-CM | POA: Diagnosis not present

## 2019-04-23 DIAGNOSIS — E7800 Pure hypercholesterolemia, unspecified: Secondary | ICD-10-CM | POA: Insufficient documentation

## 2019-04-23 DIAGNOSIS — I5022 Chronic systolic (congestive) heart failure: Secondary | ICD-10-CM | POA: Diagnosis not present

## 2019-04-23 LAB — BASIC METABOLIC PANEL
Anion gap: 9 (ref 5–15)
BUN: 14 mg/dL (ref 8–23)
CO2: 24 mmol/L (ref 22–32)
Calcium: 9.3 mg/dL (ref 8.9–10.3)
Chloride: 106 mmol/L (ref 98–111)
Creatinine, Ser: 0.91 mg/dL (ref 0.61–1.24)
GFR calc Af Amer: >60 mL/min (ref >60)
GFR calc non Af Amer: >60 mL/min (ref >60)
Glucose, Bld: 132 mg/dL — ABNORMAL HIGH (ref 70–99)
Potassium: 3.4 mmol/L — ABNORMAL LOW (ref 3.5–5.1)
Sodium: 139 mmol/L (ref 135–145)

## 2019-04-23 NOTE — Patient Instructions (Signed)
Patient Instructions  Patient Details  Name: [name] [name] MRN: [id number] Date of Birth: 06-Dec-1930 Referring Provider:  Nelva Bush, MD  Below are your personal goals for exercise, nutrition, and risk factors. Our goal is to help you stay on track towards obtaining and maintaining these goals. We will be discussing your progress on these goals with you throughout the program.  Initial Exercise Prescription: Initial Exercise Prescription - 04/23/19 1300      Date of Initial Exercise RX and Referring Provider   Date  04/23/19    Referring Provider  End, Harrell Gave MD      Treadmill   MPH  1.8    Grade  1    Minutes  15    METs  2.63      NuStep   Level  2    SPM  80    Minutes  15    METs  2.6      Recumbant Elliptical   Level  1    RPM  50    Minutes  15    METs  2.6      Prescription Details   Frequency (times per week)  2    Duration  Progress to 30 minutes of continuous aerobic without signs/symptoms of physical distress      Intensity   THRR 40-80% of Max Heartrate  89-119    Ratings of Perceived Exertion  11-13    Perceived Dyspnea  0-4      Progression   Progression  Continue to progress workloads to maintain intensity without signs/symptoms of physical distress.      Resistance Training   Training Prescription  Yes    Weight  4 lbs    Reps  10-15       Exercise Goals: Frequency: Be able to perform aerobic exercise two to three times per week in program working toward 2-5 days per week of home exercise.  Intensity: Work with a perceived exertion of 11 (fairly light) - 15 (hard) while following your exercise prescription.  We will make changes to your prescription with you as you progress through the program.   Duration: Be able to do 30 to 45 minutes of continuous aerobic exercise in addition to a 5 minute warm-up and a 5 minute cool-down routine.   Nutrition Goals: Your personal nutrition goals will be established when you do your  nutrition analysis with the dietician.  The following are general nutrition guidelines to follow: Cholesterol < 200mg /day Sodium < 1500mg /day Fiber: Men over 50 yrs - 30 grams per day  Personal Goals: Personal Goals and Risk Factors at Admission - 04/23/19 1343      Core Components/Risk Factors/Patient Goals on Admission    Weight Management  Yes;Weight Loss;Obesity    Intervention  Weight Management: Develop a combined nutrition and exercise program designed to reach desired caloric intake, while maintaining appropriate intake of nutrient and fiber, sodium and fats, and appropriate energy expenditure required for the weight goal.;Weight Management: Provide education and appropriate resources to help participant work on and attain dietary goals.;Weight Management/Obesity: Establish reasonable short term and long term weight goals.;Obesity: Provide education and appropriate resources to help participant work on and attain dietary goals.    Admit Weight  214 lb 11.2 oz (97.4 kg)    Goal Weight: Short Term  209 lb (94.8 kg)    Goal Weight: Long Term  200 lb (90.7 kg)    Expected Outcomes  Short Term: Continue to assess and modify  interventions until short term weight is achieved;Long Term: Adherence to nutrition and physical activity/exercise program aimed toward attainment of established weight goal;Weight Loss: Understanding of general recommendations for a balanced deficit meal plan, which promotes 1-2 lb weight loss per week and includes a negative energy balance of [phone] kcal/d;Understanding recommendations for meals to include 15-35% energy as protein, 25-35% energy from fat, 35-60% energy from carbohydrates, less than 200mg  of dietary cholesterol, 20-35 gm of total fiber daily;Understanding of distribution of calorie intake throughout the day with the consumption of 4-5 meals/snacks    Tobacco Cessation  Yes    Intervention  Assist the participant in steps to quit. Provide individualized  education and counseling about committing to Tobacco Cessation, relapse prevention, and pharmacological support that can be provided by physician.;Advice worker, assist with locating and accessing local/national Quit Smoking programs, and support quit date choice.    Expected Outcomes  Short Term: Will demonstrate readiness to quit, by selecting a quit date.;Short Term: Will quit all tobacco product use, adhering to prevention of relapse plan.;Long Term: Complete abstinence from all tobacco products for at least 12 months from quit date.    Heart Failure  Yes    Intervention  Provide a combined exercise and nutrition program that is supplemented with education, support and counseling about heart failure. Directed toward relieving symptoms such as shortness of breath, decreased exercise tolerance, and extremity edema.    Expected Outcomes  Improve functional capacity of life;Short term: Attendance in program 2-3 days a week with increased exercise capacity. Reported lower sodium intake. Reported increased fruit and vegetable intake. Reports medication compliance.;Short term: Daily weights obtained and reported for increase. Utilizing diuretic protocols set by physician.;Long term: Adoption of self-care skills and reduction of barriers for early signs and symptoms recognition and intervention leading to self-care maintenance.    Hypertension  Yes    Intervention  Provide education on lifestyle modifcations including regular physical activity/exercise, weight management, moderate sodium restriction and increased consumption of fresh fruit, vegetables, and low fat dairy, alcohol moderation, and smoking cessation.;Monitor prescription use compliance.    Expected Outcomes  Short Term: Continued assessment and intervention until BP is < 140/1mm HG in hypertensive participants. < 130/95mm HG in hypertensive participants with diabetes, heart failure or chronic kidney disease.;Long Term: Maintenance of  blood pressure at goal levels.    Lipids  Yes    Intervention  Provide education and support for participant on nutrition & aerobic/resistive exercise along with prescribed medications to achieve LDL 70mg , HDL >40mg .    Expected Outcomes  Short Term: Participant states understanding of desired cholesterol values and is compliant with medications prescribed. Participant is following exercise prescription and nutrition guidelines.;Long Term: Cholesterol controlled with medications as prescribed, with individualized exercise RX and with personalized nutrition plan. Value goals: LDL < 70mg , HDL > 40 mg.       Tobacco Use Initial Evaluation: Social History   Tobacco Use  Smoking Status Never Smoker  Smokeless Tobacco Current User  . Types: Chew  Tobacco Comment   not ready to quit currently    Exercise Goals and Review: Exercise Goals    Row Name 04/23/19 1342             Exercise Goals   Increase Physical Activity  Yes       Intervention  Provide advice, education, support and counseling about physical activity/exercise needs.;Develop an individualized exercise prescription for aerobic and resistive training based on initial evaluation findings, risk stratification, comorbidities and participant's  personal goals.       Expected Outcomes  Short Term: Attend rehab on a regular basis to increase amount of physical activity.;Long Term: Add in home exercise to make exercise part of routine and to increase amount of physical activity.;Long Term: Exercising regularly at least 3-5 days a week.       Increase Strength and Stamina  Yes       Intervention  Provide advice, education, support and counseling about physical activity/exercise needs.;Develop an individualized exercise prescription for aerobic and resistive training based on initial evaluation findings, risk stratification, comorbidities and participant's personal goals.       Expected Outcomes  Short Term: Increase workloads from initial  exercise prescription for resistance, speed, and METs.;Long Term: Improve cardiorespiratory fitness, muscular endurance and strength as measured by increased METs and functional capacity (6MWT);Short Term: Perform resistance training exercises routinely during rehab and add in resistance training at home       Able to understand and use rate of perceived exertion (RPE) scale  Yes       Intervention  Provide education and explanation on how to use RPE scale       Expected Outcomes  Short Term: Able to use RPE daily in rehab to express subjective intensity level;Long Term:  Able to use RPE to guide intensity level when exercising independently       Knowledge and understanding of Target Heart Rate Range (THRR)  Yes       Intervention  Provide education and explanation of THRR including how the numbers were predicted and where they are located for reference       Expected Outcomes  Short Term: Able to state/look up THRR;Short Term: Able to use daily as guideline for intensity in rehab;Long Term: Able to use THRR to govern intensity when exercising independently       Able to check pulse independently  Yes       Intervention  Provide education and demonstration on how to check pulse in carotid and radial arteries.;Review the importance of being able to check your own pulse for safety during independent exercise       Expected Outcomes  Short Term: Able to explain why pulse checking is important during independent exercise;Long Term: Able to check pulse independently and accurately       Understanding of Exercise Prescription  Yes       Intervention  Provide education, explanation, and written materials on patient's individual exercise prescription       Expected Outcomes  Short Term: Able to explain program exercise prescription;Long Term: Able to explain home exercise prescription to exercise independently          Copy of goals given to participant.

## 2019-04-23 NOTE — Progress Notes (Addendum)
Cardiac Individual Treatment Plan  Patient Details  Name: [name] [name] MRN: [id number] Date of Birth: 1931/05/28 Referring Provider:     Cardiac Rehab from 04/23/2019 in [facility] Cardiac and Pulmonary Rehab  Referring Provider  End, Harrell Gave MD      Initial Encounter Date:    Cardiac Rehab from 04/23/2019 in [facility] Cardiac and Pulmonary Rehab  Date  04/23/19      Visit Diagnosis: S/P CABG x 3  S/P AVR (aortic valve replacement)  Patient's Home Medications on Admission:  Current Outpatient Medications:  .  allopurinol (ZYLOPRIM) 100 MG tablet, TAKE 1 TABLET EVERY DAY (Patient taking differently: Take 100 mg by mouth daily. ), Disp: 90 tablet, Rfl: 3 .  aspirin 325 MG EC tablet, TAKE 1 TABLET BY MOUTH EVERY DAY, Disp: 30 tablet, Rfl: 2 .  atorvastatin (LIPITOR) 80 MG tablet, Take 1 tablet (80 mg total) by mouth every morning., Disp: 90 tablet, Rfl: 1 .  colchicine (COLCRYS) 0.6 MG tablet, Take 1 tablet (0.6 mg total) by mouth 2 (two) times daily as needed. (Patient taking differently: Take 0.6 mg by mouth 2 (two) times daily as needed (gout). ), Disp: 60 tablet, Rfl: 0 .  folic acid (FOLVITE) 1 MG tablet, Take 1 tablet (1 mg total) by mouth daily., Disp: 90 tablet, Rfl: 0 .  LORazepam (ATIVAN) 1 MG tablet, Takes 1/2 to 1 tablet q day prn (Patient taking differently: Take 0.5-1 mg by mouth at bedtime as needed for sleep. ), Disp: 30 tablet, Rfl: 2 .  losartan (COZAAR) 50 MG tablet, Take 1 tablet (50 mg total) by mouth daily., Disp: 90 tablet, Rfl: 31 .  metoprolol succinate (TOPROL-XL) 25 MG 24 hr tablet, Take 1 tablet (25 mg total) by mouth 2 (two) times daily. Take with or immediately following a meal., Disp: 180 tablet, Rfl: 2  Past Medical History: Past Medical History:  Diagnosis Date  . Aortic stenosis   . Arthritis    right shoulder  . BPH (benign prostatic hypertrophy)   . Coronary artery disease   . Gout   . Heart murmur   . History of chicken pox   .  Hypercholesterolemia   . Hypertension   . Systolic heart failure (HCC)    ischemic cardiomyopathy  . Wears dentures    full upper and lower    Tobacco Use: Social History   Tobacco Use  Smoking Status Never Smoker  Smokeless Tobacco Current User  . Types: Chew  Tobacco Comment   not ready to quit currently    Labs: Recent Review Flowsheet Data    Labs for ITP Cardiac and Pulmonary Rehab Latest Ref Rng & Units 01/25/2019 01/25/2019 01/25/2019 01/25/2019 01/25/2019   Cholestrol 0 - 200 mg/dL - - - - -   LDLCALC 0 - 99 mg/dL - - - - -   HDL >39.00 mg/dL - - - - -   Trlycerides 0.0 - 149.0 mg/dL - - - - -   Hemoglobin A1c 4.8 - 5.6 % - - - - -   PHART 7.350 - 7.450 7.423 7.404 7.413 7.319(L) -   PCO2ART 32.0 - 48.0 mmHg 39.3 33.2 30.7(L) 37.3 -   HCO3 20.0 - 28.0 mmol/L 25.7 21.0 19.8(L) 19.4(L) -   TCO2 22 - 32 mmol/L 27 22 21(L) 21(L) 20(L)   ACIDBASEDEF 0.0 - 2.0 mmol/L - 4.0(H) 4.0(H) 6.0(H) -   O2SAT % 100.0 93.0 96.0 93.0 -       Exercise Target Goals: Exercise Program  Goal: Individual exercise prescription set using results from initial 6 min walk test and THRR while considering  patient's activity barriers and safety.   Exercise Prescription Goal: Initial exercise prescription builds to 30-45 minutes a day of aerobic activity, 2-3 days per week.  Home exercise guidelines will be given to patient during program as part of exercise prescription that the participant will acknowledge.  Activity Barriers & Risk Stratification: Activity Barriers & Cardiac Risk Stratification - 04/23/19 1337      Activity Barriers & Cardiac Risk Stratification   Activity Barriers  Deconditioning;Muscular Weakness;Shortness of Breath;Other (comment);Arthritis;Joint Problems    Comments  gout, Hx frozen shoulder, recently got cortisol shot in left knee for arthriti    Cardiac Risk Stratification  High       6 Minute Walk: 6 Minute Walk    Row Name 04/23/19 1311         6 Minute Walk    Phase  Initial     Distance  998 feet     Walk Time  6 minutes     # of Rest Breaks  0     MPH  1.89     METS  1.3     RPE  7     VO2 Peak  4.54     Symptoms  No     Resting HR  59 bpm     Resting BP  128/56     Resting Oxygen Saturation   97 %     Exercise Oxygen Saturation  during 6 min walk  96 %     Max Ex. HR  86 bpm     Max Ex. BP  146/74     2 Minute Post BP  124/64        Oxygen Initial Assessment:   Oxygen Re-Evaluation:   Oxygen Discharge (Final Oxygen Re-Evaluation):   Initial Exercise Prescription: Initial Exercise Prescription - 04/23/19 1300      Date of Initial Exercise RX and Referring Provider   Date  04/23/19    Referring Provider  End, Harrell Gave MD      Treadmill   MPH  1.8    Grade  1    Minutes  15    METs  2.63      NuStep   Level  2    SPM  80    Minutes  15    METs  2.6      Recumbant Elliptical   Level  1    RPM  50    Minutes  15    METs  2.6      Prescription Details   Frequency (times per week)  2    Duration  Progress to 30 minutes of continuous aerobic without signs/symptoms of physical distress      Intensity   THRR 40-80% of Max Heartrate  89-119    Ratings of Perceived Exertion  11-13    Perceived Dyspnea  0-4      Progression   Progression  Continue to progress workloads to maintain intensity without signs/symptoms of physical distress.      Resistance Training   Training Prescription  Yes    Weight  4 lbs    Reps  10-15       Perform Capillary Blood Glucose checks as needed.  Exercise Prescription Changes:  Exercise Prescription Changes    Row Name 04/23/19 1300             Response to Exercise  Blood Pressure (Admit)  128/56       Blood Pressure (Exercise)  146/74       Blood Pressure (Exit)  124/64       Heart Rate (Admit)  59 bpm       Heart Rate (Exercise)  86 bpm       Heart Rate (Exit)  62 bpm       Oxygen Saturation (Admit)  97 %       Oxygen Saturation (Exercise)  96 %        Rating of Perceived Exertion (Exercise)  7       Symptoms  none       Comments  walk test results          Exercise Comments:   Exercise Goals and Review:  Exercise Goals    Row Name 04/23/19 1342             Exercise Goals   Increase Physical Activity  Yes       Intervention  Provide advice, education, support and counseling about physical activity/exercise needs.;Develop an individualized exercise prescription for aerobic and resistive training based on initial evaluation findings, risk stratification, comorbidities and participant's personal goals.       Expected Outcomes  Short Term: Attend rehab on a regular basis to increase amount of physical activity.;Long Term: Add in home exercise to make exercise part of routine and to increase amount of physical activity.;Long Term: Exercising regularly at least 3-5 days a week.       Increase Strength and Stamina  Yes       Intervention  Provide advice, education, support and counseling about physical activity/exercise needs.;Develop an individualized exercise prescription for aerobic and resistive training based on initial evaluation findings, risk stratification, comorbidities and participant's personal goals.       Expected Outcomes  Short Term: Increase workloads from initial exercise prescription for resistance, speed, and METs.;Long Term: Improve cardiorespiratory fitness, muscular endurance and strength as measured by increased METs and functional capacity (6MWT);Short Term: Perform resistance training exercises routinely during rehab and add in resistance training at home       Able to understand and use rate of perceived exertion (RPE) scale  Yes       Intervention  Provide education and explanation on how to use RPE scale       Expected Outcomes  Short Term: Able to use RPE daily in rehab to express subjective intensity level;Long Term:  Able to use RPE to guide intensity level when exercising independently       Knowledge and  understanding of Target Heart Rate Range (THRR)  Yes       Intervention  Provide education and explanation of THRR including how the numbers were predicted and where they are located for reference       Expected Outcomes  Short Term: Able to state/look up THRR;Short Term: Able to use daily as guideline for intensity in rehab;Long Term: Able to use THRR to govern intensity when exercising independently       Able to check pulse independently  Yes       Intervention  Provide education and demonstration on how to check pulse in carotid and radial arteries.;Review the importance of being able to check your own pulse for safety during independent exercise       Expected Outcomes  Short Term: Able to explain why pulse checking is important during independent exercise;Long Term: Able to check pulse independently and accurately  Understanding of Exercise Prescription  Yes       Intervention  Provide education, explanation, and written materials on patient's individual exercise prescription       Expected Outcomes  Short Term: Able to explain program exercise prescription;Long Term: Able to explain home exercise prescription to exercise independently          Exercise Goals Re-Evaluation :   Discharge Exercise Prescription (Final Exercise Prescription Changes): Exercise Prescription Changes - 04/23/19 1300      Response to Exercise   Blood Pressure (Admit)  128/56    Blood Pressure (Exercise)  146/74    Blood Pressure (Exit)  124/64    Heart Rate (Admit)  59 bpm    Heart Rate (Exercise)  86 bpm    Heart Rate (Exit)  62 bpm    Oxygen Saturation (Admit)  97 %    Oxygen Saturation (Exercise)  96 %    Rating of Perceived Exertion (Exercise)  7    Symptoms  none    Comments  walk test results       Nutrition:  Target Goals: Understanding of nutrition guidelines, daily intake of sodium '1500mg'$ , cholesterol '200mg'$ , calories 30% from fat and 7% or less from saturated fats, daily to have 5 or  more servings of fruits and vegetables.  Biometrics: Pre Biometrics - 04/23/19 1337      Pre Biometrics   Height  '5\' 10"'$  (1.778 m)    Weight  214 lb 11.2 oz (97.4 kg)    BMI (Calculated)  30.81    Single Leg Stand  14.19 seconds        Nutrition Therapy Plan and Nutrition Goals:   Nutrition Assessments:   Nutrition Goals Re-Evaluation:   Nutrition Goals Discharge (Final Nutrition Goals Re-Evaluation):   Psychosocial: Target Goals: Acknowledge presence or absence of significant depression and/or stress, maximize coping skills, provide positive support system. Participant is able to verbalize types and ability to use techniques and skills needed for reducing stress and depression.   Initial Review & Psychosocial Screening: Initial Psych Review & Screening - 04/18/19 1619      Initial Review   Current issues with  Current Stress Concerns;Current Sleep Concerns    Source of Stress Concerns  Unable to participate in former interests or hobbies;Unable to perform yard/household activities    Comments  Currently taking medication to help sleep.  He does have occasional nights that he doesn't sleep as well. He usually is able to get at least 6 hours a night.  He is concerned about some of his lifting restrictions since surgery and eager to have restrictions lifted.  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      Family Dynamics   Good Support System?  Yes   wife, two daughters live nearby and contact frequently     Barriers   Psychosocial barriers to participate in program  The patient should benefit from training in stress management and relaxation.;Psychosocial barriers identified (see note)      Screening Interventions   Interventions  Encouraged to exercise;To provide support and resources with identified psychosocial needs;Provide feedback about the scores to participant    Expected Outcomes  Short Term goal: Utilizing psychosocial counselor, staff and physician to assist  with identification of specific Stressors or current issues interfering with healing process. Setting desired goal for each stressor or current issue identified.;Long Term Goal: Stressors or current issues are controlled or eliminated.;Short Term goal: Identification and review with participant of any  Quality of Life or Depression concerns found by scoring the questionnaire.;Long Term goal: The participant improves quality of Life and PHQ9 Scores as seen by post scores and/or verbalization of changes       Quality of Life Scores:   Scores of 19 and below usually indicate a poorer quality of life in these areas.  A difference of  2-3 points is a clinically meaningful difference.  A difference of 2-3 points in the total score of the Quality of Life Index has been associated with significant improvement in overall quality of life, self-image, physical symptoms, and general health in studies assessing change in quality of life.  PHQ-9: Recent Review Flowsheet Data    Depression screen [facility] 2/9 04/23/2019 10/09/2018 09/15/2017 11/11/2016 09/14/2016   Decreased Interest 0 0 0 0 0   Down, Depressed, Hopeless 0 0 0 0 0   PHQ - 2 Score 0 0 0 0 0   Altered sleeping 0 - - 0 -   Tired, decreased energy 1 - - 1 -   Change in appetite 0 - - 0 -   Feeling bad or failure about yourself  0 - - 0 -   Trouble concentrating 0 - - 0 -   Moving slowly or fidgety/restless 0 - - 0 -   Suicidal thoughts 0 - - 0 -   PHQ-9 Score 1 - - 1 -   Difficult doing work/chores Somewhat difficult - - - -     Interpretation of Total Score  Total Score Depression Severity:  1-4 = Minimal depression, 5-9 = Mild depression, 10-14 = Moderate depression, 15-19 = Moderately severe depression, 20-27 = Severe depression   Psychosocial Evaluation and Intervention: Psychosocial Evaluation - 04/18/19 1624      Psychosocial Evaluation & Interventions   Interventions  Stress management education;Encouraged to exercise with the program and  follow exercise prescription    Comments  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  He is coming in for CABG x3 and AVR which has limited his ability to work his land.  He is eager for his lifting restrictions to be lifted and to get moving again.  He has no other major stressors.  He sleeps good for the most part with help from lorazopam.    Expected Outcomes  Short: Attend rehab to rebuild strength  Long: Continue to stay positive.    Continue Psychosocial Services   Follow up required by staff       Psychosocial Re-Evaluation:   Psychosocial Discharge (Final Psychosocial Re-Evaluation):   Vocational Rehabilitation: Provide vocational rehab assistance to qualifying candidates.   Vocational Rehab Evaluation & Intervention:   Education: Education Goals: Education classes will be provided on a variety of topics geared toward better understanding of heart health and risk factor modification. Participant will state understanding/return demonstration of topics presented as noted by education test scores.  Learning Barriers/Preferences: Learning Barriers/Preferences - 04/18/19 1617      Learning Barriers/Preferences   Learning Barriers  Sight   wears glasses   Learning Preferences  Verbal Instruction;Written Material   not the best on the computer      Education Topics:  AED/CPR: - Group verbal and written instruction with the use of models to demonstrate the basic use of the AED with the basic ABC's of resuscitation.   General Nutrition Guidelines/Fats and Fiber: -Group instruction provided by verbal, written material, models and posters to present the general guidelines for heart healthy nutrition. Gives an explanation  and review of dietary fats and fiber.   Controlling Sodium/Reading Food Labels: -Group verbal and written material supporting the discussion of sodium use in heart healthy nutrition. Review and explanation with models, verbal and written materials  for utilization of the food label.   Exercise Physiology & General Exercise Guidelines: - Group verbal and written instruction with models to review the exercise physiology of the cardiovascular system and associated critical values. Provides general exercise guidelines with specific guidelines to those with heart or lung disease.    Aerobic Exercise & Resistance Training: - Gives group verbal and written instruction on the various components of exercise. Focuses on aerobic and resistive training programs and the benefits of this training and how to safely progress through these programs..   Flexibility, Balance, Mind/Body Relaxation: Provides group verbal/written instruction on the benefits of flexibility and balance training, including mind/body exercise modes such as yoga, pilates and tai chi.  Demonstration and skill practice provided.   Stress and Anxiety: - Provides group verbal and written instruction about the health risks of elevated stress and causes of high stress.  Discuss the correlation between heart/lung disease and anxiety and treatment options. Review healthy ways to manage with stress and anxiety.   Depression: - Provides group verbal and written instruction on the correlation between heart/lung disease and depressed mood, treatment options, and the stigmas associated with seeking treatment.   Anatomy & Physiology of the Heart: - Group verbal and written instruction and models provide basic cardiac anatomy and physiology, with the coronary electrical and arterial systems. Review of Valvular disease and Heart Failure   Cardiac Procedures: - Group verbal and written instruction to review commonly prescribed medications for heart disease. Reviews the medication, class of the drug, and side effects. Includes the steps to properly store meds and maintain the prescription regimen. (beta blockers and nitrates)   Cardiac Medications I: - Group verbal and written instruction  to review commonly prescribed medications for heart disease. Reviews the medication, class of the drug, and side effects. Includes the steps to properly store meds and maintain the prescription regimen.   Cardiac Medications II: -Group verbal and written instruction to review commonly prescribed medications for heart disease. Reviews the medication, class of the drug, and side effects. (all other drug classes)    Go Sex-Intimacy & Heart Disease, Get SMART - Goal Setting: - Group verbal and written instruction through game format to discuss heart disease and the return to sexual intimacy. Provides group verbal and written material to discuss and apply goal setting through the application of the S.M.A.R.T. Method.   Other Matters of the Heart: - Provides group verbal, written materials and models to describe Stable Angina and Peripheral Artery. Includes description of the disease process and treatment options available to the cardiac patient.   Exercise & Equipment Safety: - Individual verbal instruction and demonstration of equipment use and safety with use of the equipment.   Cardiac Rehab from 04/23/2019 in [facility] Cardiac and Pulmonary Rehab  Date  04/23/19  Educator  [facility]  Instruction Review Code  1- Verbalizes Understanding      Infection Prevention: - Provides verbal and written material to individual with discussion of infection control including proper hand washing and proper equipment cleaning during exercise session.   Cardiac Rehab from 04/23/2019 in [facility] Cardiac and Pulmonary Rehab  Date  04/23/19  Educator  [facility]  Instruction Review Code  1- Verbalizes Understanding      Falls Prevention: - Provides verbal and written material  to individual with discussion of falls prevention and safety.   Cardiac Rehab from 04/23/2019 in [facility] Cardiac and Pulmonary Rehab  Date  04/23/19  Educator  [facility]  Instruction Review Code  1- Verbalizes Understanding      Diabetes: - Individual verbal  and written instruction to review signs/symptoms of diabetes, desired ranges of glucose level fasting, after meals and with exercise. Acknowledge that pre and post exercise glucose checks will be done for 3 sessions at entry of program.   Know Your Numbers and Risk Factors: -Group verbal and written instruction about important numbers in your health.  Discussion of what are risk factors and how they play a role in the disease process.  Review of Cholesterol, Blood Pressure, Diabetes, and BMI and the role they play in your overall health.   Sleep Hygiene: -Provides group verbal and written instruction about how sleep can affect your health.  Define sleep hygiene, discuss sleep cycles and impact of sleep habits. Review good sleep hygiene tips.    Other: -Provides group and verbal instruction on various topics (see comments)   Knowledge Questionnaire Score:   Core Components/Risk Factors/Patient Goals at Admission: Personal Goals and Risk Factors at Admission - 04/23/19 1343      Core Components/Risk Factors/Patient Goals on Admission    Weight Management  Yes;Weight Loss;Obesity    Intervention  Weight Management: Develop a combined nutrition and exercise program designed to reach desired caloric intake, while maintaining appropriate intake of nutrient and fiber, sodium and fats, and appropriate energy expenditure required for the weight goal.;Weight Management: Provide education and appropriate resources to help participant work on and attain dietary goals.;Weight Management/Obesity: Establish reasonable short term and long term weight goals.;Obesity: Provide education and appropriate resources to help participant work on and attain dietary goals.    Admit Weight  214 lb 11.2 oz (97.4 kg)    Goal Weight: Short Term  209 lb (94.8 kg)    Goal Weight: Long Term  200 lb (90.7 kg)    Expected Outcomes  Short Term: Continue to assess and modify interventions until short term weight is  achieved;Long Term: Adherence to nutrition and physical activity/exercise program aimed toward attainment of established weight goal;Weight Loss: Understanding of general recommendations for a balanced deficit meal plan, which promotes 1-2 lb weight loss per week and includes a negative energy balance of [phone] kcal/d;Understanding recommendations for meals to include 15-35% energy as protein, 25-35% energy from fat, 35-60% energy from carbohydrates, less than '200mg'$  of dietary cholesterol, 20-35 gm of total fiber daily;Understanding of distribution of calorie intake throughout the day with the consumption of 4-5 meals/snacks    Tobacco Cessation  Yes    Intervention  Assist the participant in steps to quit. Provide individualized education and counseling about committing to Tobacco Cessation, relapse prevention, and pharmacological support that can be provided by physician.;Advice worker, assist with locating and accessing local/national Quit Smoking programs, and support quit date choice.    Expected Outcomes  Short Term: Will demonstrate readiness to quit, by selecting a quit date.;Short Term: Will quit all tobacco product use, adhering to prevention of relapse plan.;Long Term: Complete abstinence from all tobacco products for at least 12 months from quit date.    Heart Failure  Yes    Intervention  Provide a combined exercise and nutrition program that is supplemented with education, support and counseling about heart failure. Directed toward relieving symptoms such as shortness of breath, decreased exercise tolerance, and extremity edema.  Expected Outcomes  Improve functional capacity of life;Short term: Attendance in program 2-3 days a week with increased exercise capacity. Reported lower sodium intake. Reported increased fruit and vegetable intake. Reports medication compliance.;Short term: Daily weights obtained and reported for increase. Utilizing diuretic protocols set by  physician.;Long term: Adoption of self-care skills and reduction of barriers for early signs and symptoms recognition and intervention leading to self-care maintenance.    Hypertension  Yes    Intervention  Provide education on lifestyle modifcations including regular physical activity/exercise, weight management, moderate sodium restriction and increased consumption of fresh fruit, vegetables, and low fat dairy, alcohol moderation, and smoking cessation.;Monitor prescription use compliance.    Expected Outcomes  Short Term: Continued assessment and intervention until BP is < 140/57m HG in hypertensive participants. < 130/875mHG in hypertensive participants with diabetes, heart failure or chronic kidney disease.;Long Term: Maintenance of blood pressure at goal levels.    Lipids  Yes    Intervention  Provide education and support for participant on nutrition & aerobic/resistive exercise along with prescribed medications to achieve LDL '70mg'$ , HDL >'40mg'$ .    Expected Outcomes  Short Term: Participant states understanding of desired cholesterol values and is compliant with medications prescribed. Participant is following exercise prescription and nutrition guidelines.;Long Term: Cholesterol controlled with medications as prescribed, with individualized exercise RX and with personalized nutrition plan. Value goals: LDL < '70mg'$ , HDL > 40 mg.       Core Components/Risk Factors/Patient Goals Review:    Core Components/Risk Factors/Patient Goals at Discharge (Final Review):    ITP Comments: ITP Comments    Row Name 04/18/19 1637 04/23/19 1218 04/23/19 1345       ITP Comments  Virtual orientation completed.  [facility]  EP eval scheduled for Mon 11/2 at 11am.  Completed 6MWT and gym orientation.  Initial ITP created and sent for review to Dr. MaEmily FilbertMedical Director.  Pt took paperwork home to complete since he did not bring his glasses.         Comments: Initial ITP

## 2019-04-24 DIAGNOSIS — Z951 Presence of aortocoronary bypass graft: Secondary | ICD-10-CM

## 2019-04-24 NOTE — Progress Notes (Signed)
Completed initial RD eval 

## 2019-04-24 NOTE — Telephone Encounter (Signed)
D/ced hctz.  Not on at discharge from hospital.

## 2019-04-24 NOTE — Telephone Encounter (Signed)
Medication has been d/c'd. Not on med list. Ok to refuse medication ?

## 2019-04-25 ENCOUNTER — Other Ambulatory Visit: Payer: Self-pay

## 2019-04-25 ENCOUNTER — Encounter: Payer: Medicare HMO | Admitting: *Deleted

## 2019-04-25 ENCOUNTER — Encounter: Payer: Self-pay | Admitting: *Deleted

## 2019-04-25 DIAGNOSIS — Z79899 Other long term (current) drug therapy: Secondary | ICD-10-CM | POA: Diagnosis not present

## 2019-04-25 DIAGNOSIS — I5020 Unspecified systolic (congestive) heart failure: Secondary | ICD-10-CM | POA: Diagnosis not present

## 2019-04-25 DIAGNOSIS — I2510 Atherosclerotic heart disease of native coronary artery without angina pectoris: Secondary | ICD-10-CM | POA: Diagnosis not present

## 2019-04-25 DIAGNOSIS — Z951 Presence of aortocoronary bypass graft: Secondary | ICD-10-CM

## 2019-04-25 DIAGNOSIS — I255 Ischemic cardiomyopathy: Secondary | ICD-10-CM | POA: Diagnosis not present

## 2019-04-25 DIAGNOSIS — I110 Hypertensive heart disease with heart failure: Secondary | ICD-10-CM | POA: Diagnosis not present

## 2019-04-25 DIAGNOSIS — Z952 Presence of prosthetic heart valve: Secondary | ICD-10-CM

## 2019-04-25 DIAGNOSIS — M109 Gout, unspecified: Secondary | ICD-10-CM | POA: Diagnosis not present

## 2019-04-25 DIAGNOSIS — I350 Nonrheumatic aortic (valve) stenosis: Secondary | ICD-10-CM | POA: Diagnosis not present

## 2019-04-25 DIAGNOSIS — Z7982 Long term (current) use of aspirin: Secondary | ICD-10-CM | POA: Diagnosis not present

## 2019-04-25 NOTE — Progress Notes (Signed)
Cardiac Individual Treatment Plan  Patient Details  Name: [name] [name] MRN: [id number] Date of Birth: 1931/05/28 Referring Provider:     Cardiac Rehab from 04/23/2019 in [facility] Cardiac and Pulmonary Rehab  Referring Provider  End, Harrell Gave MD      Initial Encounter Date:    Cardiac Rehab from 04/23/2019 in [facility] Cardiac and Pulmonary Rehab  Date  04/23/19      Visit Diagnosis: S/P CABG x 3  S/P AVR (aortic valve replacement)  Patient's Home Medications on Admission:  Current Outpatient Medications:  .  allopurinol (ZYLOPRIM) 100 MG tablet, TAKE 1 TABLET EVERY DAY (Patient taking differently: Take 100 mg by mouth daily. ), Disp: 90 tablet, Rfl: 3 .  aspirin 325 MG EC tablet, TAKE 1 TABLET BY MOUTH EVERY DAY, Disp: 30 tablet, Rfl: 2 .  atorvastatin (LIPITOR) 80 MG tablet, Take 1 tablet (80 mg total) by mouth every morning., Disp: 90 tablet, Rfl: 1 .  colchicine (COLCRYS) 0.6 MG tablet, Take 1 tablet (0.6 mg total) by mouth 2 (two) times daily as needed. (Patient taking differently: Take 0.6 mg by mouth 2 (two) times daily as needed (gout). ), Disp: 60 tablet, Rfl: 0 .  folic acid (FOLVITE) 1 MG tablet, Take 1 tablet (1 mg total) by mouth daily., Disp: 90 tablet, Rfl: 0 .  LORazepam (ATIVAN) 1 MG tablet, Takes 1/2 to 1 tablet q day prn (Patient taking differently: Take 0.5-1 mg by mouth at bedtime as needed for sleep. ), Disp: 30 tablet, Rfl: 2 .  losartan (COZAAR) 50 MG tablet, Take 1 tablet (50 mg total) by mouth daily., Disp: 90 tablet, Rfl: 31 .  metoprolol succinate (TOPROL-XL) 25 MG 24 hr tablet, Take 1 tablet (25 mg total) by mouth 2 (two) times daily. Take with or immediately following a meal., Disp: 180 tablet, Rfl: 2  Past Medical History: Past Medical History:  Diagnosis Date  . Aortic stenosis   . Arthritis    right shoulder  . BPH (benign prostatic hypertrophy)   . Coronary artery disease   . Gout   . Heart murmur   . History of chicken pox   .  Hypercholesterolemia   . Hypertension   . Systolic heart failure (HCC)    ischemic cardiomyopathy  . Wears dentures    full upper and lower    Tobacco Use: Social History   Tobacco Use  Smoking Status Never Smoker  Smokeless Tobacco Current User  . Types: Chew  Tobacco Comment   not ready to quit currently    Labs: Recent Review Flowsheet Data    Labs for ITP Cardiac and Pulmonary Rehab Latest Ref Rng & Units 01/25/2019 01/25/2019 01/25/2019 01/25/2019 01/25/2019   Cholestrol 0 - 200 mg/dL - - - - -   LDLCALC 0 - 99 mg/dL - - - - -   HDL >39.00 mg/dL - - - - -   Trlycerides 0.0 - 149.0 mg/dL - - - - -   Hemoglobin A1c 4.8 - 5.6 % - - - - -   PHART 7.350 - 7.450 7.423 7.404 7.413 7.319(L) -   PCO2ART 32.0 - 48.0 mmHg 39.3 33.2 30.7(L) 37.3 -   HCO3 20.0 - 28.0 mmol/L 25.7 21.0 19.8(L) 19.4(L) -   TCO2 22 - 32 mmol/L 27 22 21(L) 21(L) 20(L)   ACIDBASEDEF 0.0 - 2.0 mmol/L - 4.0(H) 4.0(H) 6.0(H) -   O2SAT % 100.0 93.0 96.0 93.0 -       Exercise Target Goals: Exercise Program  Goal: Individual exercise prescription set using results from initial 6 min walk test and THRR while considering  patient's activity barriers and safety.   Exercise Prescription Goal: Initial exercise prescription builds to 30-45 minutes a day of aerobic activity, 2-3 days per week.  Home exercise guidelines will be given to patient during program as part of exercise prescription that the participant will acknowledge.  Activity Barriers & Risk Stratification: Activity Barriers & Cardiac Risk Stratification - 04/23/19 1337      Activity Barriers & Cardiac Risk Stratification   Activity Barriers  Deconditioning;Muscular Weakness;Shortness of Breath;Other (comment);Arthritis;Joint Problems    Comments  gout, Hx frozen shoulder, recently got cortisol shot in left knee for arthriti    Cardiac Risk Stratification  High       6 Minute Walk: 6 Minute Walk    Row Name 04/23/19 1311         6 Minute Walk    Phase  Initial     Distance  998 feet     Walk Time  6 minutes     # of Rest Breaks  0     MPH  1.89     METS  1.3     RPE  7     VO2 Peak  4.54     Symptoms  No     Resting HR  59 bpm     Resting BP  128/56     Resting Oxygen Saturation   97 %     Exercise Oxygen Saturation  during 6 min walk  96 %     Max Ex. HR  86 bpm     Max Ex. BP  146/74     2 Minute Post BP  124/64        Oxygen Initial Assessment:   Oxygen Re-Evaluation:   Oxygen Discharge (Final Oxygen Re-Evaluation):   Initial Exercise Prescription: Initial Exercise Prescription - 04/23/19 1300      Date of Initial Exercise RX and Referring Provider   Date  04/23/19    Referring Provider  End, Harrell Gave MD      Treadmill   MPH  1.8    Grade  1    Minutes  15    METs  2.63      NuStep   Level  2    SPM  80    Minutes  15    METs  2.6      Recumbant Elliptical   Level  1    RPM  50    Minutes  15    METs  2.6      Prescription Details   Frequency (times per week)  2    Duration  Progress to 30 minutes of continuous aerobic without signs/symptoms of physical distress      Intensity   THRR 40-80% of Max Heartrate  89-119    Ratings of Perceived Exertion  11-13    Perceived Dyspnea  0-4      Progression   Progression  Continue to progress workloads to maintain intensity without signs/symptoms of physical distress.      Resistance Training   Training Prescription  Yes    Weight  4 lbs    Reps  10-15       Perform Capillary Blood Glucose checks as needed.  Exercise Prescription Changes: Exercise Prescription Changes    Row Name 04/23/19 1300             Response to Exercise  Blood Pressure (Admit)  128/56       Blood Pressure (Exercise)  146/74       Blood Pressure (Exit)  124/64       Heart Rate (Admit)  59 bpm       Heart Rate (Exercise)  86 bpm       Heart Rate (Exit)  62 bpm       Oxygen Saturation (Admit)  97 %       Oxygen Saturation (Exercise)  96 %       Rating  of Perceived Exertion (Exercise)  7       Symptoms  none       Comments  walk test results          Exercise Comments:   Exercise Goals and Review: Exercise Goals    Row Name 04/23/19 1342             Exercise Goals   Increase Physical Activity  Yes       Intervention  Provide advice, education, support and counseling about physical activity/exercise needs.;Develop an individualized exercise prescription for aerobic and resistive training based on initial evaluation findings, risk stratification, comorbidities and participant's personal goals.       Expected Outcomes  Short Term: Attend rehab on a regular basis to increase amount of physical activity.;Long Term: Add in home exercise to make exercise part of routine and to increase amount of physical activity.;Long Term: Exercising regularly at least 3-5 days a week.       Increase Strength and Stamina  Yes       Intervention  Provide advice, education, support and counseling about physical activity/exercise needs.;Develop an individualized exercise prescription for aerobic and resistive training based on initial evaluation findings, risk stratification, comorbidities and participant's personal goals.       Expected Outcomes  Short Term: Increase workloads from initial exercise prescription for resistance, speed, and METs.;Long Term: Improve cardiorespiratory fitness, muscular endurance and strength as measured by increased METs and functional capacity (6MWT);Short Term: Perform resistance training exercises routinely during rehab and add in resistance training at home       Able to understand and use rate of perceived exertion (RPE) scale  Yes       Intervention  Provide education and explanation on how to use RPE scale       Expected Outcomes  Short Term: Able to use RPE daily in rehab to express subjective intensity level;Long Term:  Able to use RPE to guide intensity level when exercising independently       Knowledge and understanding  of Target Heart Rate Range (THRR)  Yes       Intervention  Provide education and explanation of THRR including how the numbers were predicted and where they are located for reference       Expected Outcomes  Short Term: Able to state/look up THRR;Short Term: Able to use daily as guideline for intensity in rehab;Long Term: Able to use THRR to govern intensity when exercising independently       Able to check pulse independently  Yes       Intervention  Provide education and demonstration on how to check pulse in carotid and radial arteries.;Review the importance of being able to check your own pulse for safety during independent exercise       Expected Outcomes  Short Term: Able to explain why pulse checking is important during independent exercise;Long Term: Able to check pulse independently and accurately  Understanding of Exercise Prescription  Yes       Intervention  Provide education, explanation, and written materials on patient's individual exercise prescription       Expected Outcomes  Short Term: Able to explain program exercise prescription;Long Term: Able to explain home exercise prescription to exercise independently          Exercise Goals Re-Evaluation :   Discharge Exercise Prescription (Final Exercise Prescription Changes): Exercise Prescription Changes - 04/23/19 1300      Response to Exercise   Blood Pressure (Admit)  128/56    Blood Pressure (Exercise)  146/74    Blood Pressure (Exit)  124/64    Heart Rate (Admit)  59 bpm    Heart Rate (Exercise)  86 bpm    Heart Rate (Exit)  62 bpm    Oxygen Saturation (Admit)  97 %    Oxygen Saturation (Exercise)  96 %    Rating of Perceived Exertion (Exercise)  7    Symptoms  none    Comments  walk test results       Nutrition:  Target Goals: Understanding of nutrition guidelines, daily intake of sodium <1541m, cholesterol <2094m calories 30% from fat and 7% or less from saturated fats, daily to have 5 or more servings of  fruits and vegetables.  Biometrics: Pre Biometrics - 04/23/19 1337      Pre Biometrics   Height  _0  (1.778 m)    Weight  214 lb 11.2 oz (97.4 kg)    BMI (Calculated)  30.81    Single Leg Stand  14.19 seconds        Nutrition Therapy Plan and Nutrition Goals: Nutrition Therapy & Goals - 04/24/19 1202      Nutrition Therapy   Diet  HH, low Na diet    Drug/Food Interactions  Purine/Gout;Statins/Certain Fruits    Protein (specify units)  80g    Fiber  30 grams    Whole Grain Foods  3 servings    Saturated Fats  12 max. grams    Fruits and Vegetables  5 servings/day    Sodium  1.5 grams      Personal Nutrition Goals   Nutrition Goal  ST: Eat three meals a day and swap out at least one snack LT: Increase stamina, get back to ADL PTA    Comments  Eats "accross the board" eats what he has craving for, loves sweets. Wife prepares meals; sometimes will have corn bread with beans, meat and potatoes, vegetables - usually meals are fried or baked depending on the day; 2 meals a day with snacks. Pt reports most of the time will eat "junk food" - cheetos, potato chips, cream. Will have chips or ice cream before bed. Discussed HH eating. Pt would like to eat more consistent meals and swap out some snacks. Pt reports not having diabetes and has told his doctor of the charting error.      Intervention Plan   Intervention  Prescribe, educate and counsel regarding individualized specific dietary modifications aiming towards targeted core components such as weight, hypertension, lipid management, diabetes, heart failure and other comorbidities.;Nutrition handout(s) given to patient.    Expected Outcomes  Short Term Goal: Understand basic principles of dietary content, such as calories, fat, sodium, cholesterol and nutrients.;Short Term Goal: A plan has been developed with personal nutrition goals set during dietitian appointment.;Long Term Goal: Adherence to prescribed nutrition plan.        Nutrition Assessments:   Nutrition Goals Re-Evaluation:  Nutrition Goals Discharge (Final Nutrition Goals Re-Evaluation):   Psychosocial: Target Goals: Acknowledge presence or absence of significant depression and/or stress, maximize coping skills, provide positive support system. Participant is able to verbalize types and ability to use techniques and skills needed for reducing stress and depression.   Initial Review & Psychosocial Screening: Initial Psych Review & Screening - 04/18/19 1619      Initial Review   Current issues with  Current Stress Concerns;Current Sleep Concerns    Source of Stress Concerns  Unable to participate in former interests or hobbies;Unable to perform yard/household activities    Comments  Currently taking medication to help sleep.  He does have occasional nights that he doesn't sleep as well. He usually is able to get at least 6 hours a night.  He is concerned about some of his lifting restrictions since surgery and eager to have restrictions lifted.  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      Family Dynamics   Good Support System?  Yes   wife, two daughters live nearby and contact frequently     Barriers   Psychosocial barriers to participate in program  The patient should benefit from training in stress management and relaxation.;Psychosocial barriers identified (see note)      Screening Interventions   Interventions  Encouraged to exercise;To provide support and resources with identified psychosocial needs;Provide feedback about the scores to participant    Expected Outcomes  Short Term goal: Utilizing psychosocial counselor, staff and physician to assist with identification of specific Stressors or current issues interfering with healing process. Setting desired goal for each stressor or current issue identified.;Long Term Goal: Stressors or current issues are controlled or eliminated.;Short Term goal: Identification and review with  participant of any Quality of Life or Depression concerns found by scoring the questionnaire.;Long Term goal: The participant improves quality of Life and PHQ9 Scores as seen by post scores and/or verbalization of changes       Quality of Life Scores:   Scores of 19 and below usually indicate a poorer quality of life in these areas.  A difference of  2-3 points is a clinically meaningful difference.  A difference of 2-3 points in the total score of the Quality of Life Index has been associated with significant improvement in overall quality of life, self-image, physical symptoms, and general health in studies assessing change in quality of life.  PHQ-9: Recent Review Flowsheet Data    Depression screen [facility] 2/9 04/23/2019 10/09/2018 09/15/2017 11/11/2016 09/14/2016   Decreased Interest 0 0 0 0 0   Down, Depressed, Hopeless 0 0 0 0 0   PHQ - 2 Score 0 0 0 0 0   Altered sleeping 0 - - 0 -   Tired, decreased energy 1 - - 1 -   Change in appetite 0 - - 0 -   Feeling bad or failure about yourself  0 - - 0 -   Trouble concentrating 0 - - 0 -   Moving slowly or fidgety/restless 0 - - 0 -   Suicidal thoughts 0 - - 0 -   PHQ-9 Score 1 - - 1 -   Difficult doing work/chores Somewhat difficult - - - -     Interpretation of Total Score  Total Score Depression Severity:  1-4 = Minimal depression, 5-9 = Mild depression, 10-14 = Moderate depression, 15-19 = Moderately severe depression, 20-27 = Severe depression   Psychosocial Evaluation and Intervention: Psychosocial Evaluation - 04/18/19 1624  Psychosocial Evaluation & Interventions   Interventions  Stress management education;Encouraged to exercise with the program and follow exercise prescription    Comments  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  He is coming in for CABG x3 and AVR which has limited his ability to work his land.  He is eager for his lifting restrictions to be lifted and to get moving again.  He has no other major  stressors.  He sleeps good for the most part with help from lorazopam.    Expected Outcomes  Short: Attend rehab to rebuild strength  Long: Continue to stay positive.    Continue Psychosocial Services   Follow up required by staff       Psychosocial Re-Evaluation:   Psychosocial Discharge (Final Psychosocial Re-Evaluation):   Vocational Rehabilitation: Provide vocational rehab assistance to qualifying candidates.   Vocational Rehab Evaluation & Intervention:   Education: Education Goals: Education classes will be provided on a variety of topics geared toward better understanding of heart health and risk factor modification. Participant will state understanding/return demonstration of topics presented as noted by education test scores.  Learning Barriers/Preferences: Learning Barriers/Preferences - 04/18/19 1617      Learning Barriers/Preferences   Learning Barriers  Sight   wears glasses   Learning Preferences  Verbal Instruction;Written Material   not the best on the computer      Education Topics:  AED/CPR: - Group verbal and written instruction with the use of models to demonstrate the basic use of the AED with the basic ABC's of resuscitation.   General Nutrition Guidelines/Fats and Fiber: -Group instruction provided by verbal, written material, models and posters to present the general guidelines for heart healthy nutrition. Gives an explanation and review of dietary fats and fiber.   Controlling Sodium/Reading Food Labels: -Group verbal and written material supporting the discussion of sodium use in heart healthy nutrition. Review and explanation with models, verbal and written materials for utilization of the food label.   Exercise Physiology & General Exercise Guidelines: - Group verbal and written instruction with models to review the exercise physiology of the cardiovascular system and associated critical values. Provides general exercise guidelines with  specific guidelines to those with heart or lung disease.    Aerobic Exercise & Resistance Training: - Gives group verbal and written instruction on the various components of exercise. Focuses on aerobic and resistive training programs and the benefits of this training and how to safely progress through these programs..   Flexibility, Balance, Mind/Body Relaxation: Provides group verbal/written instruction on the benefits of flexibility and balance training, including mind/body exercise modes such as yoga, pilates and tai chi.  Demonstration and skill practice provided.   Stress and Anxiety: - Provides group verbal and written instruction about the health risks of elevated stress and causes of high stress.  Discuss the correlation between heart/lung disease and anxiety and treatment options. Review healthy ways to manage with stress and anxiety.   Depression: - Provides group verbal and written instruction on the correlation between heart/lung disease and depressed mood, treatment options, and the stigmas associated with seeking treatment.   Anatomy & Physiology of the Heart: - Group verbal and written instruction and models provide basic cardiac anatomy and physiology, with the coronary electrical and arterial systems. Review of Valvular disease and Heart Failure   Cardiac Procedures: - Group verbal and written instruction to review commonly prescribed medications for heart disease. Reviews the medication, class of the drug, and side effects. Includes  the steps to properly store meds and maintain the prescription regimen. (beta blockers and nitrates)   Cardiac Medications I: - Group verbal and written instruction to review commonly prescribed medications for heart disease. Reviews the medication, class of the drug, and side effects. Includes the steps to properly store meds and maintain the prescription regimen.   Cardiac Medications II: -Group verbal and written instruction to review  commonly prescribed medications for heart disease. Reviews the medication, class of the drug, and side effects. (all other drug classes)    Go Sex-Intimacy & Heart Disease, Get SMART - Goal Setting: - Group verbal and written instruction through game format to discuss heart disease and the return to sexual intimacy. Provides group verbal and written material to discuss and apply goal setting through the application of the S.M.A.R.T. Method.   Other Matters of the Heart: - Provides group verbal, written materials and models to describe Stable Angina and Peripheral Artery. Includes description of the disease process and treatment options available to the cardiac patient.   Exercise & Equipment Safety: - Individual verbal instruction and demonstration of equipment use and safety with use of the equipment.   Cardiac Rehab from 04/23/2019 in [facility] Cardiac and Pulmonary Rehab  Date  04/23/19  Educator  [facility]  Instruction Review Code  1- Verbalizes Understanding      Infection Prevention: - Provides verbal and written material to individual with discussion of infection control including proper hand washing and proper equipment cleaning during exercise session.   Cardiac Rehab from 04/23/2019 in [facility] Cardiac and Pulmonary Rehab  Date  04/23/19  Educator  [facility]  Instruction Review Code  1- Verbalizes Understanding      Falls Prevention: - Provides verbal and written material to individual with discussion of falls prevention and safety.   Cardiac Rehab from 04/23/2019 in [facility] Cardiac and Pulmonary Rehab  Date  04/23/19  Educator  [facility]  Instruction Review Code  1- Verbalizes Understanding      Diabetes: - Individual verbal and written instruction to review signs/symptoms of diabetes, desired ranges of glucose level fasting, after meals and with exercise. Acknowledge that pre and post exercise glucose checks will be done for 3 sessions at entry of program.   Know Your Numbers and Risk  Factors: -Group verbal and written instruction about important numbers in your health.  Discussion of what are risk factors and how they play a role in the disease process.  Review of Cholesterol, Blood Pressure, Diabetes, and BMI and the role they play in your overall health.   Sleep Hygiene: -Provides group verbal and written instruction about how sleep can affect your health.  Define sleep hygiene, discuss sleep cycles and impact of sleep habits. Review good sleep hygiene tips.    Other: -Provides group and verbal instruction on various topics (see comments)   Knowledge Questionnaire Score:   Core Components/Risk Factors/Patient Goals at Admission: Personal Goals and Risk Factors at Admission - 04/23/19 1343      Core Components/Risk Factors/Patient Goals on Admission    Weight Management  Yes;Weight Loss;Obesity    Intervention  Weight Management: Develop a combined nutrition and exercise program designed to reach desired caloric intake, while maintaining appropriate intake of nutrient and fiber, sodium and fats, and appropriate energy expenditure required for the weight goal.;Weight Management: Provide education and appropriate resources to help participant work on and attain dietary goals.;Weight Management/Obesity: Establish reasonable short term and long term weight goals.;Obesity: Provide education and appropriate resources to help participant work  on and attain dietary goals.    Admit Weight  214 lb 11.2 oz (97.4 kg)    Goal Weight: Short Term  209 lb (94.8 kg)    Goal Weight: Long Term  200 lb (90.7 kg)    Expected Outcomes  Short Term: Continue to assess and modify interventions until short term weight is achieved;Long Term: Adherence to nutrition and physical activity/exercise program aimed toward attainment of established weight goal;Weight Loss: Understanding of general recommendations for a balanced deficit meal plan, which promotes 1-2 lb weight loss per week and includes a  negative energy balance of [phone] kcal/d;Understanding recommendations for meals to include 15-35% energy as protein, 25-35% energy from fat, 35-60% energy from carbohydrates, less than 218m of dietary cholesterol, 20-35 gm of total fiber daily;Understanding of distribution of calorie intake throughout the day with the consumption of 4-5 meals/snacks    Tobacco Cessation  Yes    Intervention  Assist the participant in steps to quit. Provide individualized education and counseling about committing to Tobacco Cessation, relapse prevention, and pharmacological support that can be provided by physician.;OAdvice worker assist with locating and accessing local/national Quit Smoking programs, and support quit date choice.    Expected Outcomes  Short Term: Will demonstrate readiness to quit, by selecting a quit date.;Short Term: Will quit all tobacco product use, adhering to prevention of relapse plan.;Long Term: Complete abstinence from all tobacco products for at least 12 months from quit date.    Heart Failure  Yes    Intervention  Provide a combined exercise and nutrition program that is supplemented with education, support and counseling about heart failure. Directed toward relieving symptoms such as shortness of breath, decreased exercise tolerance, and extremity edema.    Expected Outcomes  Improve functional capacity of life;Short term: Attendance in program 2-3 days a week with increased exercise capacity. Reported lower sodium intake. Reported increased fruit and vegetable intake. Reports medication compliance.;Short term: Daily weights obtained and reported for increase. Utilizing diuretic protocols set by physician.;Long term: Adoption of self-care skills and reduction of barriers for early signs and symptoms recognition and intervention leading to self-care maintenance.    Hypertension  Yes    Intervention  Provide education on lifestyle modifcations including regular physical  activity/exercise, weight management, moderate sodium restriction and increased consumption of fresh fruit, vegetables, and low fat dairy, alcohol moderation, and smoking cessation.;Monitor prescription use compliance.    Expected Outcomes  Short Term: Continued assessment and intervention until BP is < 140/91mHG in hypertensive participants. < 130/8083mG in hypertensive participants with diabetes, heart failure or chronic kidney disease.;Long Term: Maintenance of blood pressure at goal levels.    Lipids  Yes    Intervention  Provide education and support for participant on nutrition & aerobic/resistive exercise along with prescribed medications to achieve LDL <83m83mDL >40mg39m Expected Outcomes  Short Term: Participant states understanding of desired cholesterol values and is compliant with medications prescribed. Participant is following exercise prescription and nutrition guidelines.;Long Term: Cholesterol controlled with medications as prescribed, with individualized exercise RX and with personalized nutrition plan. Value goals: LDL < 83mg,65m > 40 mg.       Core Components/Risk Factors/Patient Goals Review:    Core Components/Risk Factors/Patient Goals at Discharge (Final Review):    ITP Comments: ITP Comments    Row Name 04/18/19 1637 04/23/19 1218 04/23/19 1345 04/24/19 1225 04/25/19 0606   ITP Comments  Virtual orientation completed.  Documenation for diagnosis can be found in  [facility] encounter 01/25/19.  EP eval scheduled for Mon 11/2 at 11am.  Completed 6MWT and gym orientation.  Initial ITP created and sent for review to Dr. Emily Filbert, Medical Director.  Pt took paperwork home to complete since he did not bring his glasses.  Completed initial RD eval.  30 day review completed. Continue with ITP sent to Dr. Emily Filbert, Medical Director of Cardiac and Pulmonary Rehab for review , changes as needed and signature.      Comments:

## 2019-04-25 NOTE — Progress Notes (Signed)
Daily Session Note  Patient Details  Name: [name] [name] MRN: [id number] Date of Birth: June 06, 1931 Referring Provider:     Cardiac Rehab from 04/23/2019 in [facility] Cardiac and Pulmonary Rehab  Referring Provider  End, Harrell Gave MD      Encounter Date: 04/25/2019  Check In: Session Check In - 04/25/19 1103      Check-In   Supervising physician immediately available to respond to emergencies  See telemetry face sheet for immediately available ER MD    Location  [facility]-Cardiac & Pulmonary Rehab    Staff Present  Darel Hong, RN BSN;Joseph Hood RCP,RRT,BSRT;Jeanna Durrell BS, Exercise Physiologist    Virtual Visit  No    Medication changes reported      No    Fall or balance concerns reported     No    Warm-up and Cool-down  Performed on first and last piece of equipment    Resistance Training Performed  Yes    VAD Patient?  No    PAD/SET Patient?  No      Pain Assessment   Currently in Pain?  No/denies          Social History   Tobacco Use  Smoking Status Never Smoker  Smokeless Tobacco Current User  . Types: Chew  Tobacco Comment   not ready to quit currently    Goals Met:  Exercise tolerated well No report of cardiac concerns or symptoms Strength training completed today  Goals Unmet:  Not Applicable  Comments:First full day of exercise!  Patient was oriented to gym and equipment including functions, settings, policies, and procedures.  Patient's individual exercise prescription and treatment plan were reviewed.  All starting workloads were established based on the results of the 6 minute walk test done at initial orientation visit.  The plan for exercise progression was also introduced and progression will be customized based on patient's performance and goals.    Dr. Emily Filbert is Medical Director for [place] and [facility].

## 2019-04-30 ENCOUNTER — Other Ambulatory Visit: Payer: Self-pay | Admitting: *Deleted

## 2019-04-30 ENCOUNTER — Encounter: Payer: Medicare HMO | Admitting: *Deleted

## 2019-04-30 ENCOUNTER — Other Ambulatory Visit: Payer: Self-pay

## 2019-04-30 DIAGNOSIS — Z7982 Long term (current) use of aspirin: Secondary | ICD-10-CM | POA: Diagnosis not present

## 2019-04-30 DIAGNOSIS — Z952 Presence of prosthetic heart valve: Secondary | ICD-10-CM

## 2019-04-30 DIAGNOSIS — Z79899 Other long term (current) drug therapy: Secondary | ICD-10-CM | POA: Diagnosis not present

## 2019-04-30 DIAGNOSIS — Z951 Presence of aortocoronary bypass graft: Secondary | ICD-10-CM

## 2019-04-30 DIAGNOSIS — M109 Gout, unspecified: Secondary | ICD-10-CM | POA: Diagnosis not present

## 2019-04-30 DIAGNOSIS — I255 Ischemic cardiomyopathy: Secondary | ICD-10-CM | POA: Diagnosis not present

## 2019-04-30 DIAGNOSIS — I110 Hypertensive heart disease with heart failure: Secondary | ICD-10-CM | POA: Diagnosis not present

## 2019-04-30 DIAGNOSIS — I2510 Atherosclerotic heart disease of native coronary artery without angina pectoris: Secondary | ICD-10-CM | POA: Diagnosis not present

## 2019-04-30 DIAGNOSIS — I350 Nonrheumatic aortic (valve) stenosis: Secondary | ICD-10-CM | POA: Diagnosis not present

## 2019-04-30 DIAGNOSIS — I5020 Unspecified systolic (congestive) heart failure: Secondary | ICD-10-CM | POA: Diagnosis not present

## 2019-04-30 MED ORDER — ATORVASTATIN CALCIUM 80 MG PO TABS: 80.0000 mg | ORAL_TABLET | Freq: Every morning | ORAL | 1 refills | Status: DC

## 2019-04-30 NOTE — Progress Notes (Signed)
Daily Session Note  Patient Details  Name: [name] [name] MRN: [id number] Date of Birth: [birth year] Referring Provider:     Cardiac Rehab from 04/23/2019 in [facility] Cardiac and Pulmonary Rehab  Referring Provider  End, Harrell Gave MD      Encounter Date: 04/30/2019  Check In: Session Check In - 04/30/19 1107      Check-In   Supervising physician immediately available to respond to emergencies  See telemetry face sheet for immediately available ER MD    Location  [facility]-Cardiac & Pulmonary Rehab    Staff Present  Heath Lark, RN, BSN, CCRP;Meredith Sherryll Burger, RN Moises Blood, BS, ACSM CEP, Exercise Physiologist    Virtual Visit  No    Medication changes reported      No    Fall or balance concerns reported     No    Warm-up and Cool-down  Performed on first and last piece of equipment    Resistance Training Performed  Yes    VAD Patient?  No    PAD/SET Patient?  No      Pain Assessment   Currently in Pain?  No/denies          Social History   Tobacco Use  Smoking Status Never Smoker  Smokeless Tobacco Current User  . Types: Chew  Tobacco Comment   not ready to quit currently    Goals Met:  Independence with exercise equipment Exercise tolerated well No report of cardiac concerns or symptoms  Goals Unmet:  Not Applicable  Comments: Pt able to follow exercise prescription today without complaint.  Will continue to monitor for progression.    Dr. Emily Filbert is Medical Director for [place] and [facility].

## 2019-05-02 ENCOUNTER — Telehealth: Payer: Self-pay | Admitting: Internal Medicine

## 2019-05-02 ENCOUNTER — Other Ambulatory Visit: Payer: Self-pay

## 2019-05-02 ENCOUNTER — Encounter: Payer: Medicare HMO | Admitting: *Deleted

## 2019-05-02 DIAGNOSIS — I2510 Atherosclerotic heart disease of native coronary artery without angina pectoris: Secondary | ICD-10-CM | POA: Diagnosis not present

## 2019-05-02 DIAGNOSIS — I255 Ischemic cardiomyopathy: Secondary | ICD-10-CM | POA: Diagnosis not present

## 2019-05-02 DIAGNOSIS — Z7982 Long term (current) use of aspirin: Secondary | ICD-10-CM | POA: Diagnosis not present

## 2019-05-02 DIAGNOSIS — I350 Nonrheumatic aortic (valve) stenosis: Secondary | ICD-10-CM | POA: Diagnosis not present

## 2019-05-02 DIAGNOSIS — M109 Gout, unspecified: Secondary | ICD-10-CM | POA: Diagnosis not present

## 2019-05-02 DIAGNOSIS — Z951 Presence of aortocoronary bypass graft: Secondary | ICD-10-CM

## 2019-05-02 DIAGNOSIS — Z952 Presence of prosthetic heart valve: Secondary | ICD-10-CM

## 2019-05-02 DIAGNOSIS — Z79899 Other long term (current) drug therapy: Secondary | ICD-10-CM | POA: Diagnosis not present

## 2019-05-02 DIAGNOSIS — I5020 Unspecified systolic (congestive) heart failure: Secondary | ICD-10-CM | POA: Diagnosis not present

## 2019-05-02 DIAGNOSIS — I110 Hypertensive heart disease with heart failure: Secondary | ICD-10-CM | POA: Diagnosis not present

## 2019-05-02 NOTE — Telephone Encounter (Signed)
Pt dropped off BP reading from [facility] to [place]

## 2019-05-02 NOTE — Progress Notes (Signed)
Daily Session Note  Patient Details  Name: [name] [name] MRN: [id number] Date of Birth: Mar 25, 1931 Referring Provider:     Cardiac Rehab from 04/23/2019 in [facility] Cardiac and Pulmonary Rehab  Referring Provider  End, Harrell Gave MD      Encounter Date: 05/02/2019  Check In: Session Check In - 05/02/19 1046      Check-In   Supervising physician immediately available to respond to emergencies  See telemetry face sheet for immediately available ER MD    Location  [facility]-Cardiac & Pulmonary Rehab    Staff Present  Renita Papa, RN BSN;Joseph Hood RCP,RRT,BSRT;Jeanna Durrell BS, Exercise Physiologist    Virtual Visit  No    Medication changes reported      No    Fall or balance concerns reported     No    Warm-up and Cool-down  Performed on first and last piece of equipment    Resistance Training Performed  Yes    VAD Patient?  No    PAD/SET Patient?  No      Pain Assessment   Currently in Pain?  No/denies          Social History   Tobacco Use  Smoking Status Never Smoker  Smokeless Tobacco Current User  . Types: Chew  Tobacco Comment   not ready to quit currently    Goals Met:  Independence with exercise equipment Exercise tolerated well No report of cardiac concerns or symptoms Strength training completed today  Goals Unmet:  Not Applicable  Comments: Pt able to follow exercise prescription today without complaint.  Will continue to monitor for progression.    Dr. Emily Filbert is Medical Director for [place] and [facility].

## 2019-05-02 NOTE — Telephone Encounter (Signed)
Handed readings to [place]. Pt has appt tomorrow.

## 2019-05-02 NOTE — Telephone Encounter (Signed)
Blood pressure on recheck improved.  Per nurse, pt feeling well.  No chest pain.  No sob.  Agree with appt with me tomorrow.

## 2019-05-02 NOTE — Telephone Encounter (Signed)
Patient was at cardiac rehab to day and was mentioned that patient BP was high on arrival at Rehab 180/80 Pulse 65, patient was given copy of readings after completing rehab training. And advised to bring to PCP. Triaged patient BP 160/80 pulse 64 respirations 18 on arrival , patient denies SOB, Chest pain, dizziness, " I don't feel my blood pressure is high  I feel good." Patient taking losartan 25 mg two tablets 50 mg once daily confirmed with patient twice. Patient says that PCP increased back to 50 mg at last visit and he is using up some 25 mg tablets he has. Patient also taking Metoprolol 25 mg BID. Last dose on both medications approximately 8 this morning.  Dis cussed with PCP and patient is scheduled for 05/03/19 at 12. Patient was advised if any symptoms occur to be evaluated sooner and that I would call back if provider made any new recommendations. BP on recheck 145/80 pulse 69.

## 2019-05-03 ENCOUNTER — Ambulatory Visit (INDEPENDENT_AMBULATORY_CARE_PROVIDER_SITE_OTHER): Payer: Medicare HMO | Admitting: Internal Medicine

## 2019-05-03 DIAGNOSIS — E1159 Type 2 diabetes mellitus with other circulatory complications: Secondary | ICD-10-CM

## 2019-05-03 DIAGNOSIS — I10 Essential (primary) hypertension: Secondary | ICD-10-CM | POA: Diagnosis not present

## 2019-05-03 DIAGNOSIS — I2510 Atherosclerotic heart disease of native coronary artery without angina pectoris: Secondary | ICD-10-CM

## 2019-05-03 MED ORDER — LOSARTAN POTASSIUM 100 MG PO TABS: 100.0000 mg | ORAL_TABLET | Freq: Every day | ORAL | 1 refills | Status: DC

## 2019-05-03 NOTE — Progress Notes (Signed)
Patient ID: [name] [name], male   DOB: 04/02/1931, 85 y.o.   MRN: [id number]   Subjective:    Patient ID: [name] [name], male    DOB: 1930-09-11, 85 y.o.   MRN: [id number]  HPI  Patient here as a work in with concerns regarding elevated blood pressure noted at heart track.  Blood pressures reviewed.  Some variation, but is elevated.  Came in for nurse check yesterday.  Initial bp reading 160/80.  After resting 145/80.  Denies any chest pain.  No sob.  Breathing is better.  No headache.  No dizziness.  Eating and drinking well. No nausea or vomiting.  States he feels good.  Is taking losartan 82m - 2 per day.  Blood pressure medication changed recently - while in hospital.  Discussed with him today that as he is feeling better and getting back into his normal routine, we may have to adjust the medication more.     Past Medical History:  Diagnosis Date  . Aortic stenosis   . Arthritis    right shoulder  . BPH (benign prostatic hypertrophy)   . Coronary artery disease   . Gout   . Heart murmur   . History of chicken pox   . Hypercholesterolemia   . Hypertension   . Systolic heart failure (HCC)    ischemic cardiomyopathy  . Wears dentures    full upper and lower   Past Surgical History:  Procedure Laterality Date  . AORTIC VALVE REPLACEMENT N/A 01/25/2019   Procedure: AORTIC VALVE REPLACEMENT (AVR) 238mInspiris valve;  Surgeon: BaGaye PollackMD;  [facility];  Service: Open Heart Surgery;  Laterality: N/A;  . arm surgery     right arm fx s/p "plate insertion"  . CARDIAC CATHETERIZATION    . CATARACT EXTRACTION W/PHACO Right 11/17/2016   Procedure: CATARACT EXTRACTION PHACO AND INTRAOCULAR LENS PLACEMENT ([place] Right;  Surgeon: BrLeandrew KoyanagiMD;  [facility] Service: Ophthalmology;  Laterality: Right;  . CORONARY ARTERY BYPASS GRAFT N/A 01/25/2019   LIMA-LAD and sequential SVG-rPDA-rPL  . DENTAL SURGERY     all teeth extracted  . RIGHT HEART  CATH AND CORONARY ANGIOGRAPHY N/A 01/02/2019   Procedure: RIGHT HEART CATH AND CORONARY ANGIOGRAPHY;  Surgeon: EnNelva BushMD;  [facility];  Service: Cardiovascular;  Laterality: N/A;  . TEE WITHOUT CARDIOVERSION N/A 01/25/2019   Procedure: TRANSESOPHAGEAL ECHOCARDIOGRAM (TEE);  Surgeon: BaGaye PollackMD;  [facility] Service: Open Heart Surgery;  Laterality: N/A;   Family History  Problem Relation Age of Onset  . Leukemia Father   . Congestive Heart Failure Mother   . Cancer Daughter        Breast Cancer  . Prostate cancer Neg Hx   . Colon cancer Neg Hx    Social History   Socioeconomic History  . Marital status: Married    Spouse name: Not on file  . Number of children: Not on file  . Years of education: Not on file  . Highest education level: Not on file  Occupational History  . Not on file  Social Needs  . Financial resource strain: Not hard at all  . Food insecurity    Worry: Never true    Inability: Never true  . Transportation needs    Medical: No    Non-medical: No  Tobacco Use  . Smoking status: Never Smoker  . Smokeless tobacco: Current User    Types: Chew  .  Tobacco comment: not ready to quit currently  Substance and Sexual Activity  . Alcohol use: No    Alcohol/week: 0.0 standard drinks  . Drug use: No  . Sexual activity: Not Currently  Lifestyle  . Physical activity    Days per week: 0 days    Minutes per session: Not on file  . Stress: Not at all  Relationships  . Social Herbalist on phone: Not on file    Gets together: Not on file    Attends religious service: Not on file    Active member of club or organization: Not on file    Attends meetings of clubs or organizations: Not on file    Relationship status: Not on file  Other Topics Concern  . Not on file  Social History Narrative  . Not on file    Outpatient Encounter Medications as of 05/03/2019  Medication Sig  . allopurinol (ZYLOPRIM) 100 MG  tablet TAKE 1 TABLET EVERY DAY (Patient taking differently: Take 100 mg by mouth daily. )  . aspirin 325 MG EC tablet TAKE 1 TABLET BY MOUTH EVERY DAY  . atorvastatin (LIPITOR) 80 MG tablet Take 1 tablet (80 mg total) by mouth every morning.  . folic acid (FOLVITE) 1 MG tablet Take 1 tablet (1 mg total) by mouth daily.  Marland Kitchen LORazepam (ATIVAN) 1 MG tablet Takes 1/2 to 1 tablet q day prn (Patient taking differently: Take 0.5-1 mg by mouth at bedtime as needed for sleep. )  . losartan (COZAAR) 100 MG tablet Take 1 tablet (100 mg total) by mouth daily.  . metoprolol succinate (TOPROL-XL) 25 MG 24 hr tablet Take 1 tablet (25 mg total) by mouth 2 (two) times daily. Take with or immediately following a meal.  . [DISCONTINUED] colchicine (COLCRYS) 0.6 MG tablet Take 1 tablet (0.6 mg total) by mouth 2 (two) times daily as needed. (Patient taking differently: Take 0.6 mg by mouth 2 (two) times daily as needed (gout). )  . [DISCONTINUED] losartan (COZAAR) 50 MG tablet Take 1 tablet (50 mg total) by mouth daily.   No facility-administered encounter medications on file as of 05/03/2019.    Review of Systems  Constitutional: Negative for appetite change and unexpected weight change.  HENT: Negative for congestion and sinus pressure.   Respiratory: Negative for cough, chest tightness and shortness of breath.   Cardiovascular: Negative for chest pain, palpitations and leg swelling.  Gastrointestinal: Negative for abdominal pain, diarrhea, nausea and vomiting.  Genitourinary: Negative for difficulty urinating and dysuria.  Musculoskeletal: Negative for joint swelling and myalgias.  Skin: Negative for color change and rash.  Neurological: Negative for dizziness, light-headedness and headaches.  Psychiatric/Behavioral: Negative for agitation and dysphoric mood.       Objective:    Physical Exam Constitutional:      General: He is not in acute distress.    Appearance: Normal appearance. He is  well-developed.  HENT:     Head: Normocephalic and atraumatic.     Right Ear: External ear normal.     Left Ear: External ear normal.  Eyes:     General: No scleral icterus.       Right eye: No discharge.        Left eye: No discharge.     Conjunctiva/sclera: Conjunctivae normal.  Neck:     Musculoskeletal: Neck supple. No muscular tenderness.  Cardiovascular:     Rate and Rhythm: Normal rate and regular rhythm.  Pulmonary:  Effort: Pulmonary effort is normal. No respiratory distress.     Breath sounds: Normal breath sounds.  Abdominal:     General: Bowel sounds are normal.     Palpations: Abdomen is soft.     Tenderness: There is no abdominal tenderness.  Musculoskeletal:        General: No swelling or tenderness.  Lymphadenopathy:     Cervical: No cervical adenopathy.  Neurological:     Mental Status: He is alert.  Psychiatric:        Mood and Affect: Mood normal.        Behavior: Behavior normal.     BP (!) 158/82   Pulse 67   Temp 97.9 F (36.6 C)   Resp 16   Wt 209 lb 6.4 oz (95 kg)   SpO2 97%   BMI 30.05 kg/m  Wt Readings from Last 3 Encounters:  05/03/19 209 lb 6.4 oz (95 kg)  04/23/19 214 lb 11.2 oz (97.4 kg)  04/19/19 216 lb 12.8 oz (98.3 kg)     Lab Results  Component Value Date   WBC 8.9 04/11/2019   HGB 12.0 (L) 04/11/2019   HCT 36.4 (L) 04/11/2019   PLT 145 (L) 04/11/2019   GLUCOSE 132 (H) 04/23/2019   CHOL 111 08/09/2018   TRIG 50.0 08/09/2018   HDL 36.20 (L) 08/09/2018   LDLCALC 65 08/09/2018   ALT 12 01/23/2019   AST 26 01/23/2019   NA 139 04/23/2019   K 3.4 (L) 04/23/2019   CL 106 04/23/2019   CREATININE 0.91 04/23/2019   BUN 14 04/23/2019   CO2 24 04/23/2019   TSH 2.61 03/21/2018   PSA 4.15 (H) 02/08/2014   INR 1.7 (H) 01/25/2019   HGBA1C 5.9 (H) 01/23/2019   MICROALBUR 34.6 (H) 08/09/2018       Assessment & Plan:   Problem List Items Addressed This Visit    Coronary artery disease involving native coronary artery of  native heart without angina pectoris    S/p CABG and AVR.  In rehab.  Feels better.  Blood pressure elevated.  Adjust medication as outlined.        Relevant Medications   losartan (COZAAR) 100 MG tablet   Diabetes (HCC)    Low carb diet and exercise.  Follow met b and a1c.        Relevant Medications   losartan (COZAAR) 100 MG tablet   Hypertension    Blood pressure elevated.  Increase losartan to 158m q day.  Follow pressures.  Get him back in soon to reassess.        Relevant Medications   losartan (COZAAR) 100 MG tablet       CEinar Pheasant MD

## 2019-05-06 ENCOUNTER — Encounter: Payer: Self-pay | Admitting: Internal Medicine

## 2019-05-06 NOTE — Assessment & Plan Note (Signed)
S/p CABG and AVR.  In rehab.  Feels better.  Blood pressure elevated.  Adjust medication as outlined.

## 2019-05-06 NOTE — Assessment & Plan Note (Signed)
Low carb diet and exercise.  Follow met b and a1c.   

## 2019-05-06 NOTE — Assessment & Plan Note (Signed)
Blood pressure elevated.  Increase losartan to 100mg  q day.  Follow pressures.  Get him back in soon to reassess.

## 2019-05-07 ENCOUNTER — Other Ambulatory Visit: Payer: Self-pay

## 2019-05-07 ENCOUNTER — Encounter: Payer: Medicare HMO | Admitting: *Deleted

## 2019-05-07 DIAGNOSIS — Z79899 Other long term (current) drug therapy: Secondary | ICD-10-CM | POA: Diagnosis not present

## 2019-05-07 DIAGNOSIS — I5020 Unspecified systolic (congestive) heart failure: Secondary | ICD-10-CM | POA: Diagnosis not present

## 2019-05-07 DIAGNOSIS — Z951 Presence of aortocoronary bypass graft: Secondary | ICD-10-CM

## 2019-05-07 DIAGNOSIS — I2510 Atherosclerotic heart disease of native coronary artery without angina pectoris: Secondary | ICD-10-CM | POA: Diagnosis not present

## 2019-05-07 DIAGNOSIS — I110 Hypertensive heart disease with heart failure: Secondary | ICD-10-CM | POA: Diagnosis not present

## 2019-05-07 DIAGNOSIS — I350 Nonrheumatic aortic (valve) stenosis: Secondary | ICD-10-CM | POA: Diagnosis not present

## 2019-05-07 DIAGNOSIS — M109 Gout, unspecified: Secondary | ICD-10-CM | POA: Diagnosis not present

## 2019-05-07 DIAGNOSIS — I255 Ischemic cardiomyopathy: Secondary | ICD-10-CM | POA: Diagnosis not present

## 2019-05-07 DIAGNOSIS — Z952 Presence of prosthetic heart valve: Secondary | ICD-10-CM

## 2019-05-07 DIAGNOSIS — Z7982 Long term (current) use of aspirin: Secondary | ICD-10-CM | POA: Diagnosis not present

## 2019-05-07 NOTE — Progress Notes (Signed)
Daily Session Note  Patient Details  Name: [name] [name] MRN: [id number] Date of Birth: 14-Aug-1930 Referring Provider:     Cardiac Rehab from 04/23/2019 in [facility] Cardiac and Pulmonary Rehab  Referring Provider  End, Harrell Gave MD      Encounter Date: 05/07/2019  Check In: Session Check In - 05/07/19 1052      Check-In   Supervising physician immediately available to respond to emergencies  See telemetry face sheet for immediately available ER MD    Location  [facility]-Cardiac & Pulmonary Rehab    Staff Present  Renita Papa, RN Moises Blood, BS, ACSM CEP, Exercise Physiologist;Joseph Tessie Fass RCP,RRT,BSRT    Virtual Visit  No    Medication changes reported      No    Fall or balance concerns reported     No    Warm-up and Cool-down  Performed on first and last piece of equipment    Resistance Training Performed  Yes    VAD Patient?  No    PAD/SET Patient?  No      Pain Assessment   Currently in Pain?  No/denies          Social History   Tobacco Use  Smoking Status Never Smoker  Smokeless Tobacco Current User  . Types: Chew  Tobacco Comment   not ready to quit currently    Goals Met:  Independence with exercise equipment Exercise tolerated well No report of cardiac concerns or symptoms Strength training completed today  Goals Unmet:  Not Applicable  Comments: Pt able to follow exercise prescription today without complaint.  Will continue to monitor for progression.    Dr. Emily Filbert is Medical Director for [place] and [facility].

## 2019-05-09 ENCOUNTER — Other Ambulatory Visit: Payer: Self-pay

## 2019-05-09 ENCOUNTER — Encounter: Payer: Medicare HMO | Admitting: *Deleted

## 2019-05-09 DIAGNOSIS — Z79899 Other long term (current) drug therapy: Secondary | ICD-10-CM | POA: Diagnosis not present

## 2019-05-09 DIAGNOSIS — Z951 Presence of aortocoronary bypass graft: Secondary | ICD-10-CM

## 2019-05-09 DIAGNOSIS — M109 Gout, unspecified: Secondary | ICD-10-CM | POA: Diagnosis not present

## 2019-05-09 DIAGNOSIS — I2510 Atherosclerotic heart disease of native coronary artery without angina pectoris: Secondary | ICD-10-CM | POA: Diagnosis not present

## 2019-05-09 DIAGNOSIS — Z952 Presence of prosthetic heart valve: Secondary | ICD-10-CM

## 2019-05-09 DIAGNOSIS — I5020 Unspecified systolic (congestive) heart failure: Secondary | ICD-10-CM | POA: Diagnosis not present

## 2019-05-09 DIAGNOSIS — I350 Nonrheumatic aortic (valve) stenosis: Secondary | ICD-10-CM | POA: Diagnosis not present

## 2019-05-09 DIAGNOSIS — I110 Hypertensive heart disease with heart failure: Secondary | ICD-10-CM | POA: Diagnosis not present

## 2019-05-09 DIAGNOSIS — I255 Ischemic cardiomyopathy: Secondary | ICD-10-CM | POA: Diagnosis not present

## 2019-05-09 DIAGNOSIS — Z7982 Long term (current) use of aspirin: Secondary | ICD-10-CM | POA: Diagnosis not present

## 2019-05-09 NOTE — Progress Notes (Signed)
Daily Session Note  Patient Details  Name: [name] [name] MRN: [id number] Date of Birth: [birth year] Referring Provider:     Cardiac Rehab from 04/23/2019 in [facility] Cardiac and Pulmonary Rehab  Referring Provider  End, Harrell Gave MD      Encounter Date: 05/09/2019  Check In: Session Check In - 05/09/19 1115      Check-In   Supervising physician immediately available to respond to emergencies  See telemetry face sheet for immediately available ER MD    Location  [facility]-Cardiac & Pulmonary Rehab    Staff Present  Darel Hong, RN BSN;Meredith Sherryll Burger, RN BSN;Jeanna Durrell BS, Exercise Physiologist    Virtual Visit  No    Medication changes reported      No    Fall or balance concerns reported     No    Warm-up and Cool-down  Performed on first and last piece of equipment    Resistance Training Performed  Yes    VAD Patient?  No    PAD/SET Patient?  No      Pain Assessment   Currently in Pain?  No/denies          Social History   Tobacco Use  Smoking Status Never Smoker  Smokeless Tobacco Current User  . Types: Chew  Tobacco Comment   not ready to quit currently    Goals Met:  Independence with exercise equipment Exercise tolerated well No report of cardiac concerns or symptoms Strength training completed today  Goals Unmet:  Not Applicable  Comments: Pt able to follow exercise prescription today without complaint.  Will continue to monitor for progression.    Dr. Emily Filbert is Medical Director for [place] and [facility].

## 2019-05-14 ENCOUNTER — Encounter: Payer: Medicare HMO | Admitting: *Deleted

## 2019-05-14 ENCOUNTER — Other Ambulatory Visit: Payer: Self-pay

## 2019-05-14 DIAGNOSIS — I2510 Atherosclerotic heart disease of native coronary artery without angina pectoris: Secondary | ICD-10-CM | POA: Diagnosis not present

## 2019-05-14 DIAGNOSIS — Z951 Presence of aortocoronary bypass graft: Secondary | ICD-10-CM

## 2019-05-14 DIAGNOSIS — Z79899 Other long term (current) drug therapy: Secondary | ICD-10-CM | POA: Diagnosis not present

## 2019-05-14 DIAGNOSIS — Z952 Presence of prosthetic heart valve: Secondary | ICD-10-CM

## 2019-05-14 DIAGNOSIS — I5020 Unspecified systolic (congestive) heart failure: Secondary | ICD-10-CM | POA: Diagnosis not present

## 2019-05-14 DIAGNOSIS — I110 Hypertensive heart disease with heart failure: Secondary | ICD-10-CM | POA: Diagnosis not present

## 2019-05-14 DIAGNOSIS — Z7982 Long term (current) use of aspirin: Secondary | ICD-10-CM | POA: Diagnosis not present

## 2019-05-14 DIAGNOSIS — I255 Ischemic cardiomyopathy: Secondary | ICD-10-CM | POA: Diagnosis not present

## 2019-05-14 DIAGNOSIS — I350 Nonrheumatic aortic (valve) stenosis: Secondary | ICD-10-CM | POA: Diagnosis not present

## 2019-05-14 DIAGNOSIS — M109 Gout, unspecified: Secondary | ICD-10-CM | POA: Diagnosis not present

## 2019-05-14 NOTE — Progress Notes (Signed)
Daily Session Note  Patient Details  Name: CODEN FRANCHI MRN: [id number] Date of Birth: 11-04-1930 Referring Provider:     Cardiac Rehab from 04/23/2019 in [facility] Cardiac and Pulmonary Rehab  Referring Provider  End, Harrell Gave MD      Encounter Date: 05/14/2019  Check In: Session Check In - 05/14/19 1044      Check-In   Supervising physician immediately available to respond to emergencies  See telemetry face sheet for immediately available ER MD    Location  [facility]-Cardiac & Pulmonary Rehab    Staff Present  Renita Papa, RN BSN;Jessica [place], MA, RCEP, CCRP, [place], BS, ACSM CEP, Exercise Physiologist    Virtual Visit  No    Medication changes reported      No    Fall or balance concerns reported     No    Warm-up and Cool-down  Performed on first and last piece of equipment    Resistance Training Performed  Yes    VAD Patient?  No    PAD/SET Patient?  No      Pain Assessment   Currently in Pain?  No/denies          Social History   Tobacco Use  Smoking Status Never Smoker  Smokeless Tobacco Current User  . Types: Chew  Tobacco Comment   not ready to quit currently    Goals Met:  Independence with exercise equipment Exercise tolerated well No report of cardiac concerns or symptoms Strength training completed today  Goals Unmet:  Not Applicable  Comments: Pt able to follow exercise prescription today without complaint.  Will continue to monitor for progression. Reviewed home exercise with pt today.  Pt plans to yes for exercise.  Reviewed THR, pulse, RPE, sign and symptoms, NTG use, and when to call 911 or MD.  Also discussed weather considerations and indoor options.  Pt voiced understanding.     Dr. Emily Filbert is Medical Director for [place] and [facility].

## 2019-05-16 ENCOUNTER — Other Ambulatory Visit: Payer: Self-pay

## 2019-05-16 ENCOUNTER — Encounter: Payer: Medicare HMO | Admitting: *Deleted

## 2019-05-16 DIAGNOSIS — I2510 Atherosclerotic heart disease of native coronary artery without angina pectoris: Secondary | ICD-10-CM | POA: Diagnosis not present

## 2019-05-16 DIAGNOSIS — Z952 Presence of prosthetic heart valve: Secondary | ICD-10-CM

## 2019-05-16 DIAGNOSIS — M109 Gout, unspecified: Secondary | ICD-10-CM | POA: Diagnosis not present

## 2019-05-16 DIAGNOSIS — Z79899 Other long term (current) drug therapy: Secondary | ICD-10-CM | POA: Diagnosis not present

## 2019-05-16 DIAGNOSIS — I350 Nonrheumatic aortic (valve) stenosis: Secondary | ICD-10-CM | POA: Diagnosis not present

## 2019-05-16 DIAGNOSIS — I255 Ischemic cardiomyopathy: Secondary | ICD-10-CM | POA: Diagnosis not present

## 2019-05-16 DIAGNOSIS — Z951 Presence of aortocoronary bypass graft: Secondary | ICD-10-CM

## 2019-05-16 DIAGNOSIS — I110 Hypertensive heart disease with heart failure: Secondary | ICD-10-CM | POA: Diagnosis not present

## 2019-05-16 DIAGNOSIS — Z7982 Long term (current) use of aspirin: Secondary | ICD-10-CM | POA: Diagnosis not present

## 2019-05-16 DIAGNOSIS — I5020 Unspecified systolic (congestive) heart failure: Secondary | ICD-10-CM | POA: Diagnosis not present

## 2019-05-16 NOTE — Progress Notes (Signed)
Daily Session Note  Patient Details  Name: [name] [name] MRN: [id number] Date of Birth: [birth year] Referring Provider:     Cardiac Rehab from 04/23/2019 in [facility] Rehab  Referring Provider  End, Harrell Gave MD      Encounter Date: 05/16/2019  Check In: Session Check In - 05/16/19 1057      Check-In   Supervising physician immediately available to respond to emergencies  See telemetry face sheet for immediately available ER MD    Location  [facility]-Cardiac & Pulmonary Rehab    Staff Present  Renita Papa, RN Vickki Hearing, BA, ACSM CEP, Exercise Physiologist;Jeanna Durrell BS, Exercise Physiologist    Virtual Visit  No    Medication changes reported      No    Fall or balance concerns reported     No    Warm-up and Cool-down  Performed on first and last piece of equipment    Resistance Training Performed  Yes    VAD Patient?  No    PAD/SET Patient?  No      Pain Assessment   Currently in Pain?  No/denies          Social History   Tobacco Use  Smoking Status Never Smoker  Smokeless Tobacco Current User  . Types: Chew  Tobacco Comment   not ready to quit currently    Goals Met:  Independence with exercise equipment Exercise tolerated well No report of cardiac concerns or symptoms Strength training completed today  Goals Unmet:  Not Applicable  Comments: Pt able to follow exercise prescription today without complaint.  Will continue to monitor for progression.    Dr. Emily Filbert is Medical Director for [place] and [facility].

## 2019-05-21 ENCOUNTER — Encounter: Payer: Medicare HMO | Admitting: *Deleted

## 2019-05-21 ENCOUNTER — Other Ambulatory Visit: Payer: Self-pay

## 2019-05-21 DIAGNOSIS — I5020 Unspecified systolic (congestive) heart failure: Secondary | ICD-10-CM | POA: Diagnosis not present

## 2019-05-21 DIAGNOSIS — M109 Gout, unspecified: Secondary | ICD-10-CM | POA: Diagnosis not present

## 2019-05-21 DIAGNOSIS — Z7982 Long term (current) use of aspirin: Secondary | ICD-10-CM | POA: Diagnosis not present

## 2019-05-21 DIAGNOSIS — Z952 Presence of prosthetic heart valve: Secondary | ICD-10-CM

## 2019-05-21 DIAGNOSIS — I2510 Atherosclerotic heart disease of native coronary artery without angina pectoris: Secondary | ICD-10-CM | POA: Diagnosis not present

## 2019-05-21 DIAGNOSIS — Z79899 Other long term (current) drug therapy: Secondary | ICD-10-CM | POA: Diagnosis not present

## 2019-05-21 DIAGNOSIS — I110 Hypertensive heart disease with heart failure: Secondary | ICD-10-CM | POA: Diagnosis not present

## 2019-05-21 DIAGNOSIS — Z951 Presence of aortocoronary bypass graft: Secondary | ICD-10-CM

## 2019-05-21 DIAGNOSIS — I255 Ischemic cardiomyopathy: Secondary | ICD-10-CM | POA: Diagnosis not present

## 2019-05-21 DIAGNOSIS — I350 Nonrheumatic aortic (valve) stenosis: Secondary | ICD-10-CM | POA: Diagnosis not present

## 2019-05-21 NOTE — Progress Notes (Signed)
Daily Session Note  Patient Details  Name: [name] [name] MRN: [id number] Date of Birth: [birth year] Referring Provider:     Cardiac Rehab from 04/23/2019 in [facility] Card[facility] and Pulmonary Rehab  Referring Provider  End, Harrell Gave MD      Encounter Date: 05/21/2019  Check In: Session Check In - 05/21/19 1050      Check-In   Supervising physician immediately available to respond to emergencies  See telemetry face sheet for immediately available ER MD    Location  [facility]-Cardiac & Pulmonary Rehab    Staff Present  Renita Papa, RN BSN;Jessica [place], MA, RCEP, CCRP, [place], BS, ACSM CEP, Exercise Physiologist    Virtual Visit  No    Medication changes reported      No    Fall or balance concerns reported     No    Warm-up and Cool-down  Performed on first and last piece of equipment    Resistance Training Performed  Yes    VAD Patient?  No    PAD/SET Patient?  No      Pain Assessment   Currently in Pain?  No/denies          Social History   Tobacco Use  Smoking Status Never Smoker  Smokeless Tobacco Current User  . Types: Chew  Tobacco Comment   not ready to quit currently    Goals Met:  Independence with exercise equipment Exercise tolerated well No report of cardiac concerns or symptoms Strength training completed today  Goals Unmet:  Not Applicable  Comments: Pt able to follow exercise prescription today without complaint.  Will continue to monitor for progression.    Dr. Emily Filbert is Medical Director for [place] and [facility].

## 2019-05-22 ENCOUNTER — Ambulatory Visit: Payer: Medicare HMO | Admitting: Internal Medicine

## 2019-05-23 ENCOUNTER — Encounter: Payer: Medicare HMO | Attending: Internal Medicine | Admitting: *Deleted

## 2019-05-23 ENCOUNTER — Encounter: Payer: Self-pay | Admitting: *Deleted

## 2019-05-23 DIAGNOSIS — E7800 Pure hypercholesterolemia, unspecified: Secondary | ICD-10-CM | POA: Diagnosis not present

## 2019-05-23 DIAGNOSIS — I110 Hypertensive heart disease with heart failure: Secondary | ICD-10-CM | POA: Insufficient documentation

## 2019-05-23 DIAGNOSIS — Z951 Presence of aortocoronary bypass graft: Secondary | ICD-10-CM | POA: Insufficient documentation

## 2019-05-23 DIAGNOSIS — I5020 Unspecified systolic (congestive) heart failure: Secondary | ICD-10-CM | POA: Diagnosis not present

## 2019-05-23 DIAGNOSIS — M109 Gout, unspecified: Secondary | ICD-10-CM | POA: Insufficient documentation

## 2019-05-23 DIAGNOSIS — I350 Nonrheumatic aortic (valve) stenosis: Secondary | ICD-10-CM | POA: Diagnosis not present

## 2019-05-23 DIAGNOSIS — I255 Ischemic cardiomyopathy: Secondary | ICD-10-CM | POA: Insufficient documentation

## 2019-05-23 DIAGNOSIS — M19011 Primary osteoarthritis, right shoulder: Secondary | ICD-10-CM | POA: Insufficient documentation

## 2019-05-23 DIAGNOSIS — I2510 Atherosclerotic heart disease of native coronary artery without angina pectoris: Secondary | ICD-10-CM | POA: Insufficient documentation

## 2019-05-23 DIAGNOSIS — Z79899 Other long term (current) drug therapy: Secondary | ICD-10-CM | POA: Insufficient documentation

## 2019-05-23 DIAGNOSIS — Z952 Presence of prosthetic heart valve: Secondary | ICD-10-CM

## 2019-05-23 DIAGNOSIS — Z7982 Long term (current) use of aspirin: Secondary | ICD-10-CM | POA: Insufficient documentation

## 2019-05-23 NOTE — Progress Notes (Signed)
Daily Session Note  Patient Details  Name: [name] [name] MRN: [id number] Date of Birth: [birth year] Referring Provider:     Cardiac Rehab from 04/23/2019 in [facility] Cardiac and Pulmonary Rehab  Referring Provider  End, Harrell Gave MD      Encounter Date: 05/23/2019  Check In: Session Check In - 05/23/19 1109      Check-In   Supervising physician immediately available to respond to emergencies  See telemetry face sheet for immediately available ER MD    Location  [facility]-Cardiac & Pulmonary Rehab    Staff Present  Darel Hong, RN BSN;Jeanna Durrell BS, Exercise Physiologist;Melissa Caiola RDN, LDN    Virtual Visit  No    Medication changes reported      No    Fall or balance concerns reported     No    Warm-up and Cool-down  Performed on first and last piece of equipment    Resistance Training Performed  Yes    VAD Patient?  No    PAD/SET Patient?  No      Pain Assessment   Currently in Pain?  No/denies          Social History   Tobacco Use  Smoking Status Never Smoker  Smokeless Tobacco Current User  . Types: Chew  Tobacco Comment   not ready to quit currently    Goals Met:  Independence with exercise equipment Exercise tolerated well No report of cardiac concerns or symptoms Strength training completed today  Goals Unmet:  Not Applicable  Comments: Pt able to follow exercise prescription today without complaint.  Will continue to monitor for progression.    Dr. Emily Filbert is Medical Director for [place] and [facility].

## 2019-05-23 NOTE — Progress Notes (Signed)
Cardiac Individual Treatment Plan  Patient Details  Name: [name] [name] MRN: [id number] Date of Birth: 1930-07-15 Referring Provider:     Cardiac Rehab from 04/23/2019 in [facility] Cardiac and Pulmonary Rehab  Referring Provider  End, Harrell Gave MD      Initial Encounter Date:    Cardiac Rehab from 04/23/2019 in [facility] Cardiac and Pulmonary Rehab  Date  04/23/19      Visit Diagnosis: S/P CABG x 3  S/P AVR (aortic valve replacement)  Patient's Home Medications on Admission:  Current Outpatient Medications:  .  allopurinol (ZYLOPRIM) 100 MG tablet, TAKE 1 TABLET EVERY DAY (Patient taking differently: Take 100 mg by mouth daily. ), Disp: 90 tablet, Rfl: 3 .  aspirin 325 MG EC tablet, TAKE 1 TABLET BY MOUTH EVERY DAY, Disp: 30 tablet, Rfl: 2 .  atorvastatin (LIPITOR) 80 MG tablet, Take 1 tablet (80 mg total) by mouth every morning., Disp: 90 tablet, Rfl: 1 .  folic acid (FOLVITE) 1 MG tablet, Take 1 tablet (1 mg total) by mouth daily., Disp: 90 tablet, Rfl: 0 .  LORazepam (ATIVAN) 1 MG tablet, Takes 1/2 to 1 tablet q day prn (Patient taking differently: Take 0.5-1 mg by mouth at bedtime as needed for sleep. ), Disp: 30 tablet, Rfl: 2 .  losartan (COZAAR) 100 MG tablet, Take 1 tablet (100 mg total) by mouth daily., Disp: 30 tablet, Rfl: 1 .  metoprolol succinate (TOPROL-XL) 25 MG 24 hr tablet, Take 1 tablet (25 mg total) by mouth 2 (two) times daily. Take with or immediately following a meal., Disp: 180 tablet, Rfl: 2  Past Medical History: Past Medical History:  Diagnosis Date  . Aortic stenosis   . Arthritis    right shoulder  . BPH (benign prostatic hypertrophy)   . Coronary artery disease   . Gout   . Heart murmur   . History of chicken pox   . Hypercholesterolemia   . Hypertension   . Systolic heart failure (HCC)    ischemic cardiomyopathy  . Wears dentures    full upper and lower    Tobacco Use: Social History   Tobacco Use  Smoking Status Never Smoker  Smokeless  Tobacco Current User  . Types: Chew  Tobacco Comment   not ready to quit currently    Labs: Recent Review Flowsheet Data    Labs for ITP Cardiac and Pulmonary Rehab Latest Ref Rng & Units 01/25/2019 01/25/2019 01/25/2019 01/25/2019 01/25/2019   Cholestrol 0 - 200 mg/dL - - - - -   LDLCALC 0 - 99 mg/dL - - - - -   HDL >39.00 mg/dL - - - - -   Trlycerides 0.0 - 149.0 mg/dL - - - - -   Hemoglobin A1c 4.8 - 5.6 % - - - - -   PHART 7.350 - 7.450 7.423 7.404 7.413 7.319(L) -   PCO2ART 32.0 - 48.0 mmHg 39.3 33.2 30.7(L) 37.3 -   HCO3 20.0 - 28.0 mmol/L 25.7 21.0 19.8(L) 19.4(L) -   TCO2 22 - 32 mmol/L 27 22 21(L) 21(L) 20(L)   ACIDBASEDEF 0.0 - 2.0 mmol/L - 4.0(H) 4.0(H) 6.0(H) -   O2SAT % 100.0 93.0 96.0 93.0 -       Exercise Target Goals: Exercise Program Goal: Individual exercise prescription set using results from initial 6 min walk test and THRR while considering  patient's activity barriers and safety.   Exercise Prescription Goal: Initial exercise prescription builds to 30-45 minutes a day of aerobic activity, 2-3 days  per week.  Home exercise guidelines will be given to patient during program as part of exercise prescription that the participant will acknowledge.  Activity Barriers & Risk Stratification: Activity Barriers & Cardiac Risk Stratification - 04/23/19 1337      Activity Barriers & Cardiac Risk Stratification   Activity Barriers  Deconditioning;Muscular Weakness;Shortness of Breath;Other (comment);Arthritis;Joint Problems    Comments  gout, Hx frozen shoulder, recently got cortisol shot in left knee for arthriti    Cardiac Risk Stratification  High       6 Minute Walk: 6 Minute Walk    Row Name 04/23/19 1311         6 Minute Walk   Phase  Initial     Distance  998 feet     Walk Time  6 minutes     # of Rest Breaks  0     MPH  1.89     METS  1.3     RPE  7     VO2 Peak  4.54     Symptoms  No     Resting HR  59 bpm     Resting BP  128/56     Resting Oxygen  Saturation   97 %     Exercise Oxygen Saturation  during 6 min walk  96 %     Max Ex. HR  86 bpm     Max Ex. BP  146/74     2 Minute Post BP  124/64        Oxygen Initial Assessment:   Oxygen Re-Evaluation:   Oxygen Discharge (Final Oxygen Re-Evaluation):   Initial Exercise Prescription: Initial Exercise Prescription - 04/23/19 1300      Date of Initial Exercise RX and Referring Provider   Date  04/23/19    Referring Provider  End, Harrell Gave MD      Treadmill   MPH  1.8    Grade  1    Minutes  15    METs  2.63      NuStep   Level  2    SPM  80    Minutes  15    METs  2.6      Recumbant Elliptical   Level  1    RPM  50    Minutes  15    METs  2.6      Prescription Details   Frequency (times per week)  2    Duration  Progress to 30 minutes of continuous aerobic without signs/symptoms of physical distress      Intensity   THRR 40-80% of Max Heartrate  89-119    Ratings of Perceived Exertion  11-13    Perceived Dyspnea  0-4      Progression   Progression  Continue to progress workloads to maintain intensity without signs/symptoms of physical distress.      Resistance Training   Training Prescription  Yes    Weight  4 lbs    Reps  10-15       Perform Capillary Blood Glucose checks as needed.  Exercise Prescription Changes: Exercise Prescription Changes    Row Name 04/23/19 1300 05/10/19 1100           Response to Exercise   Blood Pressure (Admit)  128/56  180/80      Blood Pressure (Exercise)  146/74  178/80      Blood Pressure (Exit)  124/64  170/84      Heart Rate (Admit)  59  bpm  61 bpm      Heart Rate (Exercise)  86 bpm  78 bpm      Heart Rate (Exit)  62 bpm  68 bpm      Oxygen Saturation (Admit)  97 %  -      Oxygen Saturation (Exercise)  96 %  -      Rating of Perceived Exertion (Exercise)  7  11      Symptoms  none  none      Comments  walk test results  -        Progression   Progression  -  Continue to progress workloads to  maintain intensity without signs/symptoms of physical distress.      Average METs  -  2        Resistance Training   Training Prescription  -  Yes      Weight  -  4 lb      Reps  -  10-15        Interval Training   Interval Training  -  No        Treadmill   MPH  -  1.2      Grade  -  1      Minutes  -  15      METs  -  2.08        NuStep   Level  -  4      SPM  -  80      Minutes  -  15      METs  -  2.1         Exercise Comments: Exercise Comments    Row Name 04/25/19 1104           Exercise Comments  First full day of exercise!  Patient was oriented to gym and equipment including functions, settings, policies, and procedures.  Patient's individual exercise prescription and treatment plan were reviewed.  All starting workloads were established based on the results of the 6 minute walk test done at initial orientation visit.  The plan for exercise progression was also introduced and progression will be customized based on patient's performance and goals.          Exercise Goals and Review: Exercise Goals    Row Name 04/23/19 1342             Exercise Goals   Increase Physical Activity  Yes       Intervention  Provide advice, education, support and counseling about physical activity/exercise needs.;Develop an individualized exercise prescription for aerobic and resistive training based on initial evaluation findings, risk stratification, comorbidities and participant's personal goals.       Expected Outcomes  Short Term: Attend rehab on a regular basis to increase amount of physical activity.;Long Term: Add in home exercise to make exercise part of routine and to increase amount of physical activity.;Long Term: Exercising regularly at least 3-5 days a week.       Increase Strength and Stamina  Yes       Intervention  Provide advice, education, support and counseling about physical activity/exercise needs.;Develop an individualized exercise prescription for aerobic and  resistive training based on initial evaluation findings, risk stratification, comorbidities and participant's personal goals.       Expected Outcomes  Short Term: Increase workloads from initial exercise prescription for resistance, speed, and METs.;Long Term: Improve cardiorespiratory fitness, muscular endurance and strength as measured by increased METs and functional  capacity (6MWT);Short Term: Perform resistance training exercises routinely during rehab and add in resistance training at home       Able to understand and use rate of perceived exertion (RPE) scale  Yes       Intervention  Provide education and explanation on how to use RPE scale       Expected Outcomes  Short Term: Able to use RPE daily in rehab to express subjective intensity level;Long Term:  Able to use RPE to guide intensity level when exercising independently       Knowledge and understanding of Target Heart Rate Range (THRR)  Yes       Intervention  Provide education and explanation of THRR including how the numbers were predicted and where they are located for reference       Expected Outcomes  Short Term: Able to state/look up THRR;Short Term: Able to use daily as guideline for intensity in rehab;Long Term: Able to use THRR to govern intensity when exercising independently       Able to check pulse independently  Yes       Intervention  Provide education and demonstration on how to check pulse in carotid and radial arteries.;Review the importance of being able to check your own pulse for safety during independent exercise       Expected Outcomes  Short Term: Able to explain why pulse checking is important during independent exercise;Long Term: Able to check pulse independently and accurately       Understanding of Exercise Prescription  Yes       Intervention  Provide education, explanation, and written materials on patient's individual exercise prescription       Expected Outcomes  Short Term: Able to explain program exercise  prescription;Long Term: Able to explain home exercise prescription to exercise independently          Exercise Goals Re-Evaluation : Exercise Goals Re-Evaluation    Row Name 04/25/19 1105 05/10/19 1154 05/14/19 1111         Exercise Goal Re-Evaluation   Exercise Goals Review  Increase Physical Activity;Able to understand and use rate of perceived exertion (RPE) scale;Knowledge and understanding of Target Heart Rate Range (THRR);Understanding of Exercise Prescription;Increase Strength and Stamina  Increase Physical Activity;Increase Strength and Stamina;Able to understand and use rate of perceived exertion (RPE) scale;Able to understand and use Dyspnea scale;Able to check pulse independently;Knowledge and understanding of Target Heart Rate Range (THRR);Understanding of Exercise Prescription  Increase Physical Activity;Increase Strength and Stamina;Able to understand and use rate of perceived exertion (RPE) scale;Knowledge and understanding of Target Heart Rate Range (THRR);Able to check pulse independently;Understanding of Exercise Prescription     Comments  Reviewed RPE scale, THR and program prescription with pt today.  Pt voiced understanding and was given a copy of goals to take home.  Erling is working at [employer].  He has moved up resistance on NS.  Staff will monitor progress.  Reviewed home exercise with pt today.  Pt plans to follow prescription for exercise.  Reviewed THR, pulse, RPE, sign and symptoms, NTG use, and when to call 911 or MD.  Also discussed weather considerations and indoor options.  Pt voiced understanding.     Expected Outcomes  Short: Use RPE daily to regulate intensity. Long: Follow program prescription in THR.  Short - attend consistently Long - improve overall MET level  Short - add walking at home 2-3 days per week Long - increase overall stamina  Discharge Exercise Prescription (Final Exercise Prescription Changes): Exercise Prescription Changes - 05/10/19 1100       Response to Exercise   Blood Pressure (Admit)  180/80    Blood Pressure (Exercise)  178/80    Blood Pressure (Exit)  170/84    Heart Rate (Admit)  61 bpm    Heart Rate (Exercise)  78 bpm    Heart Rate (Exit)  68 bpm    Rating of Perceived Exertion (Exercise)  11    Symptoms  none      Progression   Progression  Continue to progress workloads to maintain intensity without signs/symptoms of physical distress.    Average METs  2      Resistance Training   Training Prescription  Yes    Weight  4 lb    Reps  10-15      Interval Training   Interval Training  No      Treadmill   MPH  1.2    Grade  1    Minutes  15    METs  2.08      NuStep   Level  4    SPM  80    Minutes  15    METs  2.1       Nutrition:  Target Goals: Understanding of nutrition guidelines, daily intake of sodium '1500mg'$ , cholesterol '200mg'$ , calories 30% from fat and 7% or less from saturated fats, daily to have 5 or more servings of fruits and vegetables.  Biometrics: Pre Biometrics - 04/23/19 1337      Pre Biometrics   Height  '5\' 10"'$  (1.778 m)    Weight  214 lb 11.2 oz (97.4 kg)    BMI (Calculated)  30.81    Single Leg Stand  14.19 seconds        Nutrition Therapy Plan and Nutrition Goals: Nutrition Therapy & Goals - 04/24/19 1202      Nutrition Therapy   Diet  HH, low Na diet    Drug/Food Interactions  Purine/Gout;Statins/Certain Fruits    Protein (specify units)  80g    Fiber  30 grams    Whole Grain Foods  3 servings    Saturated Fats  12 max. grams    Fruits and Vegetables  5 servings/day    Sodium  1.5 grams      Personal Nutrition Goals   Nutrition Goal  ST: Eat three meals a day and swap out at least one snack LT: Increase stamina, get back to ADL PTA    Comments  Eats "accross the board" eats what he has craving for, loves sweets. Wife prepares meals; sometimes will have corn bread with beans, meat and potatoes, vegetables - usually meals are fried or baked depending on the  day; 2 meals a day with snacks. Pt reports most of the time will eat "junk food" - cheetos, potato chips, cream. Will have chips or ice cream before bed. Discussed HH eating. Pt would like to eat more consistent meals and swap out some snacks. Pt reports not having diabetes and has told his doctor of the charting error.      Intervention Plan   Intervention  Prescribe, educate and counsel regarding individualized specific dietary modifications aiming towards targeted core components such as weight, hypertension, lipid management, diabetes, heart failure and other comorbidities.;Nutrition handout(s) given to patient.    Expected Outcomes  Short Term Goal: Understand basic principles of dietary content, such as calories, fat, sodium, cholesterol and nutrients.;Short Term Goal:  A plan has been developed with personal nutrition goals set during dietitian appointment.;Long Term Goal: Adherence to prescribed nutrition plan.       Nutrition Assessments: Nutrition Assessments - 04/30/19 1246      MEDFICTS Scores   Pre Score  76       Nutrition Goals Re-Evaluation: Nutrition Goals Re-Evaluation    [place] Name 05/16/19 1121             Goals   Nutrition Goal  ST: have healthy snacks on hand to eat when busy so that his energy increases LT: Increase stamina, get back to ADL PTA       Comment  Pt reports doing well, most significant change was lowering salt intake: will not add salt to food when it reaches the table. Still has low energy. Pt reports doing better when switching out snacks. Pt reports that when he gets busy he doesn't listen to the hunger whistpers as well and doesn't notice hunger. Discussed having things on hand to eat when busy may help with energy level as he may not be eating enough.       Expected Outcome  ST: have healthy snacks on hand to eat when busy so that his energy increases LT: Increase stamina, get back to ADL PTA          Nutrition Goals Discharge (Final Nutrition  Goals Re-Evaluation): Nutrition Goals Re-Evaluation - 05/16/19 1121      Goals   Nutrition Goal  ST: have healthy snacks on hand to eat when busy so that his energy increases LT: Increase stamina, get back to ADL PTA    Comment  Pt reports doing well, most significant change was lowering salt intake: will not add salt to food when it reaches the table. Still has low energy. Pt reports doing better when switching out snacks. Pt reports that when he gets busy he doesn't listen to the hunger whistpers as well and doesn't notice hunger. Discussed having things on hand to eat when busy may help with energy level as he may not be eating enough.    Expected Outcome  ST: have healthy snacks on hand to eat when busy so that his energy increases LT: Increase stamina, get back to ADL PTA       Psychosocial: Target Goals: Acknowledge presence or absence of significant depression and/or stress, maximize coping skills, provide positive support system. Participant is able to verbalize types and ability to use techniques and skills needed for reducing stress and depression.   Initial Review & Psychosocial Screening: Initial Psych Review & Screening - 04/18/19 1619      Initial Review   Current issues with  Current Stress Concerns;Current Sleep Concerns    Source of Stress Concerns  Unable to participate in former interests or hobbies;Unable to perform yard/household activities    Comments  Currently taking medication to help sleep.  He does have occasional nights that he doesn't sleep as well. He usually is able to get at least 6 hours a night.  He is concerned about some of his lifting restrictions since surgery and eager to have restrictions lifted.  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      Family Dynamics   Good Support System?  Yes   wife, two daughters live nearby and contact frequently     Barriers   Psychosocial barriers to participate in program  The patient should benefit from training in  stress management and relaxation.;Psychosocial barriers identified (see  note)      Screening Interventions   Interventions  Encouraged to exercise;To provide support and resources with identified psychosocial needs;Provide feedback about the scores to participant    Expected Outcomes  Short Term goal: Utilizing psychosocial counselor, staff and physician to assist with identification of specific Stressors or current issues interfering with healing process. Setting desired goal for each stressor or current issue identified.;Long Term Goal: Stressors or current issues are controlled or eliminated.;Short Term goal: Identification and review with participant of any Quality of Life or Depression concerns found by scoring the questionnaire.;Long Term goal: The participant improves quality of Life and PHQ9 Scores as seen by post scores and/or verbalization of changes       Quality of Life Scores:  Quality of Life - 04/30/19 1245      Quality of Life   Select  Quality of Life      Quality of Life Scores   Health/Function Pre  28.18 %    Socioeconomic Pre  27 %    Psych/Spiritual Pre  29.14 %    Family Pre  28.8 %    GLOBAL Pre  28.23 %      Scores of 19 and below usually indicate a poorer quality of life in these areas.  A difference of  2-3 points is a clinically meaningful difference.  A difference of 2-3 points in the total score of the Quality of Life Index has been associated with significant improvement in overall quality of life, self-image, physical symptoms, and general health in studies assessing change in quality of life.  PHQ-9: Recent Review Flowsheet Data    Depression screen [facility] 2/9 04/23/2019 10/09/2018 09/15/2017 11/11/2016 09/14/2016   Decreased Interest 0 0 0 0 0   Down, Depressed, Hopeless 0 0 0 0 0   PHQ - 2 Score 0 0 0 0 0   Altered sleeping 0 - - 0 -   Tired, decreased energy 1 - - 1 -   Change in appetite 0 - - 0 -   Feeling bad or failure about yourself  0 - - 0 -    Trouble concentrating 0 - - 0 -   Moving slowly or fidgety/restless 0 - - 0 -   Suicidal thoughts 0 - - 0 -   PHQ-9 Score 1 - - 1 -   Difficult doing work/chores Somewhat difficult - - - -     Interpretation of Total Score  Total Score Depression Severity:  1-4 = Minimal depression, 5-9 = Mild depression, 10-14 = Moderate depression, 15-19 = Moderately severe depression, 20-27 = Severe depression   Psychosocial Evaluation and Intervention: Psychosocial Evaluation - 04/18/19 1624      Psychosocial Evaluation & Interventions   Interventions  Stress management education;Encouraged to exercise with the program and follow exercise prescription    Comments  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  He is coming in for CABG x3 and AVR which has limited his ability to work his land.  He is eager for his lifting restrictions to be lifted and to get moving again.  He has no other major stressors.  He sleeps good for the most part with help from lorazopam.    Expected Outcomes  Short: Attend rehab to rebuild strength  Long: Continue to stay positive.    Continue Psychosocial Services   Follow up required by staff       Psychosocial Re-Evaluation: Psychosocial Re-Evaluation    [place] Name 05/14/19 1057  Psychosocial Re-Evaluation   Current issues with  Current Stress Concerns       Comments  Kamaal reports no stress or anxiety.  He doesnt sleep well.  He doesnt sleep as well if he has had a long day.  He does have to get up frequently to use the restroom.  He will talk to Dr Nicki Reaper about that.  He does have RX for sleep but doesnt take it a lot.       Expected Outcomes  Short -  talk to Dr about sleep Long - get better rest and continue to manage stress          Psychosocial Discharge (Final Psychosocial Re-Evaluation): Psychosocial Re-Evaluation - 05/14/19 1057      Psychosocial Re-Evaluation   Current issues with  Current Stress Concerns    Comments  Amarii  reports no stress or anxiety.  He doesnt sleep well.  He doesnt sleep as well if he has had a long day.  He does have to get up frequently to use the restroom.  He will talk to Dr Nicki Reaper about that.  He does have RX for sleep but doesnt take it a lot.    Expected Outcomes  Short -  talk to Dr about sleep Long - get better rest and continue to manage stress       Vocational Rehabilitation: Provide vocational rehab assistance to qualifying candidates.   Vocational Rehab Evaluation & Intervention:   Education: Education Goals: Education classes will be provided on a variety of topics geared toward better understanding of heart health and risk factor modification. Participant will state understanding/return demonstration of topics presented as noted by education test scores.  Learning Barriers/Preferences: Learning Barriers/Preferences - 04/18/19 1617      Learning Barriers/Preferences   Learning Barriers  Sight   wears glasses   Learning Preferences  Verbal Instruction;Written Material   not the best on the computer      Education Topics:  AED/CPR: - Group verbal and written instruction with the use of models to demonstrate the basic use of the AED with the basic ABC's of resuscitation.   General Nutrition Guidelines/Fats and Fiber: -Group instruction provided by verbal, written material, models and posters to present the general guidelines for heart healthy nutrition. Gives an explanation and review of dietary fats and fiber.   Cardiac Rehab from 05/09/2019 in [facility] Cardiac and Pulmonary Rehab  Date  05/09/19  Educator  MC,RD  Instruction Review Code  1- Verbalizes Understanding      Controlling Sodium/Reading Food Labels: -Group verbal and written material supporting the discussion of sodium use in heart healthy nutrition. Review and explanation with models, verbal and written materials for utilization of the food label.   Exercise Physiology & General Exercise Guidelines: -  Group verbal and written instruction with models to review the exercise physiology of the cardiovascular system and associated critical values. Provides general exercise guidelines with specific guidelines to those with heart or lung disease.    Aerobic Exercise & Resistance Training: - Gives group verbal and written instruction on the various components of exercise. Focuses on aerobic and resistive training programs and the benefits of this training and how to safely progress through these programs..   Cardiac Rehab from 05/09/2019 in [facility] Cardiac and Pulmonary Rehab  Date  04/25/19  Educator  AS  Instruction Review Code  1- Verbalizes Understanding      Flexibility, Balance, Mind/Body Relaxation: Provides group verbal/written instruction on the benefits of flexibility and  balance training, including mind/body exercise modes such as yoga, pilates and tai chi.  Demonstration and skill practice provided.   Cardiac Rehab from 05/09/2019 in [facility] Cardiac and Pulmonary Rehab  Date  05/09/19  Educator  AS  Instruction Review Code  1- Verbalizes Understanding      Stress and Anxiety: - Provides group verbal and written instruction about the health risks of elevated stress and causes of high stress.  Discuss the correlation between heart/lung disease and anxiety and treatment options. Review healthy ways to manage with stress and anxiety.   Depression: - Provides group verbal and written instruction on the correlation between heart/lung disease and depressed mood, treatment options, and the stigmas associated with seeking treatment.   Anatomy & Physiology of the Heart: - Group verbal and written instruction and models provide basic cardiac anatomy and physiology, with the coronary electrical and arterial systems. Review of Valvular disease and Heart Failure   Cardiac Procedures: - Group verbal and written instruction to review commonly prescribed medications for heart disease. Reviews the  medication, class of the drug, and side effects. Includes the steps to properly store meds and maintain the prescription regimen. (beta blockers and nitrates)   Cardiac Rehab from 05/09/2019 in [facility] Cardiac and Pulmonary Rehab  Date  04/25/19  Educator  SB  Instruction Review Code  1- Verbalizes Understanding      Cardiac Medications I: - Group verbal and written instruction to review commonly prescribed medications for heart disease. Reviews the medication, class of the drug, and side effects. Includes the steps to properly store meds and maintain the prescription regimen.   Cardiac Medications II: -Group verbal and written instruction to review commonly prescribed medications for heart disease. Reviews the medication, class of the drug, and side effects. (all other drug classes)    Go Sex-Intimacy & Heart Disease, Get SMART - Goal Setting: - Group verbal and written instruction through game format to discuss heart disease and the return to sexual intimacy. Provides group verbal and written material to discuss and apply goal setting through the application of the S.M.A.R.T. Method.   Cardiac Rehab from 05/09/2019 in [facility] Cardiac and Pulmonary Rehab  Date  04/25/19  Educator  SB  Instruction Review Code  1- Verbalizes Understanding      Other Matters of the Heart: - Provides group verbal, written materials and models to describe Stable Angina and Peripheral Artery. Includes description of the disease process and treatment options available to the cardiac patient.   Exercise & Equipment Safety: - Individual verbal instruction and demonstration of equipment use and safety with use of the equipment.   Cardiac Rehab from 04/23/2019 in [facility] Cardiac and Pulmonary Rehab  Date  04/23/19  Educator  [facility]  Instruction Review Code  1- Verbalizes Understanding      Infection Prevention: - Provides verbal and written material to individual with discussion of infection control including proper  hand washing and proper equipment cleaning during exercise session.   Cardiac Rehab from 04/23/2019 in [facility] Cardiac and Pulmonary Rehab  Date  04/23/19  Educator  [facility]  Instruction Review Code  1- Verbalizes Understanding      Falls Prevention: - Provides verbal and written material to individual with discussion of falls prevention and safety.   Cardiac Rehab from 04/23/2019 in [facility] Cardiac and Pulmonary Rehab  Date  04/23/19  Educator  [facility]  Instruction Review Code  1- Verbalizes Understanding      Diabetes: - Individual verbal and written instruction to review  signs/symptoms of diabetes, desired ranges of glucose level fasting, after meals and with exercise. Acknowledge that pre and post exercise glucose checks will be done for 3 sessions at entry of program.   Know Your Numbers and Risk Factors: -Group verbal and written instruction about important numbers in your health.  Discussion of what are risk factors and how they play a role in the disease process.  Review of Cholesterol, Blood Pressure, Diabetes, and BMI and the role they play in your overall health.   Sleep Hygiene: -Provides group verbal and written instruction about how sleep can affect your health.  Define sleep hygiene, discuss sleep cycles and impact of sleep habits. Review good sleep hygiene tips.    Other: -Provides group and verbal instruction on various topics (see comments)   Knowledge Questionnaire Score: Knowledge Questionnaire Score - 04/30/19 1246      Knowledge Questionnaire Score   Pre Score  19/26       Core Components/Risk Factors/Patient Goals at Admission: Personal Goals and Risk Factors at Admission - 04/23/19 1343      Core Components/Risk Factors/Patient Goals on Admission    Weight Management  Yes;Weight Loss;Obesity    Intervention  Weight Management: Develop a combined nutrition and exercise program designed to reach desired caloric intake, while maintaining appropriate intake of nutrient  and fiber, sodium and fats, and appropriate energy expenditure required for the weight goal.;Weight Management: Provide education and appropriate resources to help participant work on and attain dietary goals.;Weight Management/Obesity: Establish reasonable short term and long term weight goals.;Obesity: Provide education and appropriate resources to help participant work on and attain dietary goals.    Admit Weight  214 lb 11.2 oz (97.4 kg)    Goal Weight: Short Term  209 lb (94.8 kg)    Goal Weight: Long Term  200 lb (90.7 kg)    Expected Outcomes  Short Term: Continue to assess and modify interventions until short term weight is achieved;Long Term: Adherence to nutrition and physical activity/exercise program aimed toward attainment of established weight goal;Weight Loss: Understanding of general recommendations for a balanced deficit meal plan, which promotes 1-2 lb weight loss per week and includes a negative energy balance of [phone] kcal/d;Understanding recommendations for meals to include 15-35% energy as protein, 25-35% energy from fat, 35-60% energy from carbohydrates, less than '200mg'$  of dietary cholesterol, 20-35 gm of total fiber daily;Understanding of distribution of calorie intake throughout the day with the consumption of 4-5 meals/snacks    Tobacco Cessation  Yes    Intervention  Assist the participant in steps to quit. Provide individualized education and counseling about committing to Tobacco Cessation, relapse prevention, and pharmacological support that can be provided by physician.;Advice worker, assist with locating and accessing local/national Quit Smoking programs, and support quit date choice.    Expected Outcomes  Short Term: Will demonstrate readiness to quit, by selecting a quit date.;Short Term: Will quit all tobacco product use, adhering to prevention of relapse plan.;Long Term: Complete abstinence from all tobacco products for at least 12 months from quit date.     Heart Failure  Yes    Intervention  Provide a combined exercise and nutrition program that is supplemented with education, support and counseling about heart failure. Directed toward relieving symptoms such as shortness of breath, decreased exercise tolerance, and extremity edema.    Expected Outcomes  Improve functional capacity of life;Short term: Attendance in program 2-3 days a week with increased exercise capacity. Reported lower sodium intake. Reported increased fruit  and vegetable intake. Reports medication compliance.;Short term: Daily weights obtained and reported for increase. Utilizing diuretic protocols set by physician.;Long term: Adoption of self-care skills and reduction of barriers for early signs and symptoms recognition and intervention leading to self-care maintenance.    Hypertension  Yes    Intervention  Provide education on lifestyle modifcations including regular physical activity/exercise, weight management, moderate sodium restriction and increased consumption of fresh fruit, vegetables, and low fat dairy, alcohol moderation, and smoking cessation.;Monitor prescription use compliance.    Expected Outcomes  Short Term: Continued assessment and intervention until BP is < 140/88m HG in hypertensive participants. < 130/835mHG in hypertensive participants with diabetes, heart failure or chronic kidney disease.;Long Term: Maintenance of blood pressure at goal levels.    Lipids  Yes    Intervention  Provide education and support for participant on nutrition & aerobic/resistive exercise along with prescribed medications to achieve LDL '70mg'$ , HDL >'40mg'$ .    Expected Outcomes  Short Term: Participant states understanding of desired cholesterol values and is compliant with medications prescribed. Participant is following exercise prescription and nutrition guidelines.;Long Term: Cholesterol controlled with medications as prescribed, with individualized exercise RX and with personalized  nutrition plan. Value goals: LDL < '70mg'$ , HDL > 40 mg.       Core Components/Risk Factors/Patient Goals Review:  Goals and Risk Factor Review    Row Name 05/14/19 1052 05/14/19 1056           Core Components/Risk Factors/Patient Goals Review   Personal Goals Review  Weight Management/Obesity;Tobacco Cessation;Hypertension  -      Review  Ch[place] been taking all meds.  He does have a BP cuff at home but it was not accurate.  BP was 138/74 at rest today.  He still chews tobacco.  -      Expected Outcomes  -  Short - continue to monitor BP at home - he is working with Dr ScNicki Reapern meds Long - manage risk factors         Core Components/Risk Factors/Patient Goals at Discharge (Final Review):  Goals and Risk Factor Review - 05/14/19 1056      Core Components/Risk Factors/Patient Goals Review   Expected Outcomes  Short - continue to monitor BP at home - he is working with Dr ScNicki Reapern meds Long - manage risk factors       ITP Comments: ITP Comments    Row Name 04/18/19 1637 04/23/19 1218 04/23/19 1345 04/24/19 1225 04/25/19 0606   ITP Comments  Virtual orientation completed.  [facility]  EP eval scheduled for Mon 11/2 at 11am.  Completed 6MWT and gym orientation.  Initial ITP created and sent for review to Dr. MaEmily FilbertMedical Director.  Pt took paperwork home to complete since he did not bring his glasses.  Completed initial RD eval.  30 day review completed. Continue with ITP sent to Dr. MaEmily FilbertMedical Director of Cardiac and Pulmonary Rehab for review , changes as needed and signature.   RoKino Springsame 05/23/19 0917           ITP Comments  30 day review competed . ITP sent to Dr MaEmily Filbertor review, changes as needed and ITP approval signature.          Comments:

## 2019-05-24 ENCOUNTER — Other Ambulatory Visit: Payer: Self-pay | Admitting: Internal Medicine

## 2019-05-25 ENCOUNTER — Other Ambulatory Visit: Payer: Self-pay | Admitting: Internal Medicine

## 2019-05-28 ENCOUNTER — Encounter: Payer: Medicare HMO | Admitting: *Deleted

## 2019-05-28 ENCOUNTER — Other Ambulatory Visit: Payer: Self-pay

## 2019-05-28 DIAGNOSIS — I350 Nonrheumatic aortic (valve) stenosis: Secondary | ICD-10-CM | POA: Diagnosis not present

## 2019-05-28 DIAGNOSIS — Z952 Presence of prosthetic heart valve: Secondary | ICD-10-CM

## 2019-05-28 DIAGNOSIS — M109 Gout, unspecified: Secondary | ICD-10-CM | POA: Diagnosis not present

## 2019-05-28 DIAGNOSIS — I110 Hypertensive heart disease with heart failure: Secondary | ICD-10-CM | POA: Diagnosis not present

## 2019-05-28 DIAGNOSIS — I255 Ischemic cardiomyopathy: Secondary | ICD-10-CM | POA: Diagnosis not present

## 2019-05-28 DIAGNOSIS — I2510 Atherosclerotic heart disease of native coronary artery without angina pectoris: Secondary | ICD-10-CM | POA: Diagnosis not present

## 2019-05-28 DIAGNOSIS — Z79899 Other long term (current) drug therapy: Secondary | ICD-10-CM | POA: Diagnosis not present

## 2019-05-28 DIAGNOSIS — Z7982 Long term (current) use of aspirin: Secondary | ICD-10-CM | POA: Diagnosis not present

## 2019-05-28 DIAGNOSIS — Z951 Presence of aortocoronary bypass graft: Secondary | ICD-10-CM | POA: Diagnosis not present

## 2019-05-28 DIAGNOSIS — I5020 Unspecified systolic (congestive) heart failure: Secondary | ICD-10-CM | POA: Diagnosis not present

## 2019-05-28 NOTE — Progress Notes (Signed)
Daily Session Note  Patient Details  Name: [name] [name] MRN: [id number] Date of Birth: [birth year] Referring Provider:     Cardiac Rehab from 04/23/2019 in [facility] Cardiac and Pulmonary Rehab  Referring Provider  End, Harrell Gave MD      Encounter Date: 05/28/2019  Check In: Session Check In - 05/28/19 1031      Check-In   Supervising physician immediately available to respond to emergencies  See telemetry face sheet for immediately available ER MD    Location  [facility]-Cardiac & Pulmonary Rehab    Staff Present  Renita Papa, RN Moises Blood, BS, ACSM CEP, Exercise Physiologist;Joseph Tessie Fass RCP,RRT,BSRT    Virtual Visit  No    Medication changes reported      No    Fall or balance concerns reported     No    Warm-up and Cool-down  Performed on first and last piece of equipment    Resistance Training Performed  Yes    VAD Patient?  No    PAD/SET Patient?  No      Pain Assessment   Currently in Pain?  No/denies          Social History   Tobacco Use  Smoking Status Never Smoker  Smokeless Tobacco Current User  . Types: Chew  Tobacco Comment   not ready to quit currently    Goals Met:  Independence with exercise equipment Exercise tolerated well No report of cardiac concerns or symptoms Strength training completed today  Goals Unmet:  Not Applicable  Comments: Pt able to follow exercise prescription today without complaint.  Will continue to monitor for progression.    Dr. Emily Filbert is Medical Director for [place] and [facility].

## 2019-05-29 ENCOUNTER — Ambulatory Visit (INDEPENDENT_AMBULATORY_CARE_PROVIDER_SITE_OTHER): Payer: Medicare HMO | Admitting: Internal Medicine

## 2019-05-29 VITALS — BP 158/72 | Ht 70.5 in | Wt 213.2 lb

## 2019-05-29 DIAGNOSIS — D649 Anemia, unspecified: Secondary | ICD-10-CM

## 2019-05-29 DIAGNOSIS — E1159 Type 2 diabetes mellitus with other circulatory complications: Secondary | ICD-10-CM

## 2019-05-29 DIAGNOSIS — E785 Hyperlipidemia, unspecified: Secondary | ICD-10-CM

## 2019-05-29 DIAGNOSIS — I2510 Atherosclerotic heart disease of native coronary artery without angina pectoris: Secondary | ICD-10-CM | POA: Diagnosis not present

## 2019-05-29 DIAGNOSIS — R400 Somnolence: Secondary | ICD-10-CM

## 2019-05-29 DIAGNOSIS — I10 Essential (primary) hypertension: Secondary | ICD-10-CM | POA: Diagnosis not present

## 2019-05-29 NOTE — Progress Notes (Signed)
Patient ID: [name] [name], male   DOB: 27-Apr-1931, 85 y.o.   MRN: [id number]   Virtual Visit via telephone Note  This visit type was conducted due to national recommendations for restrictions regarding the COVID-19 pandemic (e.g. social distancing).  This format is felt to be most appropriate for this patient at this time.  All issues noted in this document were discussed and addressed.  No physical exam was performed (except for noted visual exam findings with Video Visits).   I connected with [name] by telephone and verified that I am speaking with the correct person using two identifiers. Location patient: home Location provider: work Persons participating in the telephone visit: patient, provider  I discussed the limitations, risks, security and privacy concerns of performing an evaluation and management service by telephone and the availability of in person appointments.  The patient expressed understanding and agreed to proceed.   Reason for visit: scheduled follow up.   HPI: He reports he is doing relatively well.  Going to cardiac rehab.  No chest pain.  No sob. Breathing better since surgery.  No acid reflux.  No abdominal pain.  Bowels moving.  Blood pressure has ben monitored at rehab.  Has been elevated.  Outside checks reviewed. Discussed adjustment in medication.  He is agreeable.  He also reports nocturia - 3-4x/night.  No dysuria.  Discussed possible sleep apnea.  He reports waking up - does not feel rested.  Daytime somnolence and notice of nocturia.  He was agreeable for evaluation for possible sleep apnea.  He is not checking his sugars.  Discussed diet and exercise.     ROS: See pertinent positives and negatives per HPI.  Past Medical History:  Diagnosis Date  . Aortic stenosis   . Arthritis    right shoulder  . BPH (benign prostatic hypertrophy)   . Coronary artery disease   . Gout   . Heart murmur   . History of chicken pox   . Hypercholesterolemia    . Hypertension   . Systolic heart failure (HCC)    ischemic cardiomyopathy  . Wears dentures    full upper and lower    Past Surgical History:  Procedure Laterality Date  . AORTIC VALVE REPLACEMENT N/A 01/25/2019   Procedure: AORTIC VALVE REPLACEMENT (AVR) 21m Inspiris valve;  Surgeon: BGaye Pollack MD;  [place]  Service: Open Heart Surgery;  Laterality: N/A;  . arm surgery     right arm fx s/p "plate insertion"  . CARDIAC CATHETERIZATION    . CATARACT EXTRACTION W/PHACO Right 11/17/2016   Procedure: CATARACT EXTRACTION PHACO AND INTRAOCULAR LENS PLACEMENT ([place]  Right;  Surgeon: BLeandrew Koyanagi MD;  [place]  Service: Ophthalmology;  Laterality: Right;  . CORONARY ARTERY BYPASS GRAFT N/A 01/25/2019   LIMA-LAD and sequential [id number]  . DENTAL SURGERY     all teeth extracted  . RIGHT HEART CATH AND CORONARY ANGIOGRAPHY N/A 01/02/2019   Procedure: RIGHT HEART CATH AND CORONARY ANGIOGRAPHY;  Surgeon: ENelva Bush MD;  [place];  Service: Cardiovascular;  Laterality: N/A;  . TEE WITHOUT CARDIOVERSION N/A 01/25/2019   Procedure: TRANSESOPHAGEAL ECHOCARDIOGRAM (TEE);  Surgeon: BGaye Pollack MD;  [place]  Service: Open Heart Surgery;  Laterality: N/A;    Family History  Problem Relation Age of Onset  . Leukemia Father   . Congestive Heart Failure Mother   . Cancer Daughter        Breast Cancer  .  Prostate cancer Neg Hx   . Colon cancer Neg Hx     SOCIAL HX: reviewed.    Current Outpatient Medications:  .  allopurinol (ZYLOPRIM) 100 MG tablet, TAKE 1 TABLET EVERY DAY (Patient taking differently: Take 100 mg by mouth daily. ), Disp: 90 tablet, Rfl: 3 .  amLODipine (NORVASC) 5 MG tablet, Take 1 tablet (5 mg total) by mouth daily., Disp: 30 tablet, Rfl: 0 .  aspirin 325 MG EC tablet, TAKE 1 TABLET BY MOUTH EVERY DAY, Disp: 30 tablet, Rfl: 2 .  atorvastatin (LIPITOR) 80 MG tablet, Take 1 tablet (80 mg total)  by mouth every morning., Disp: 90 tablet, Rfl: 1 .  folic acid (FOLVITE) 1 MG tablet, TAKE 1 TABLET BY MOUTH EVERY DAY, Disp: 90 tablet, Rfl: 0 .  LORazepam (ATIVAN) 1 MG tablet, Takes 1/2 to 1 tablet q day prn (Patient taking differently: Take 0.5-1 mg by mouth at bedtime as needed for sleep. ), Disp: 30 tablet, Rfl: 2 .  losartan (COZAAR) 100 MG tablet, TAKE 1 TABLET BY MOUTH EVERY DAY, Disp: 90 tablet, Rfl: 1 .  metoprolol succinate (TOPROL-XL) 25 MG 24 hr tablet, Take 1 tablet (25 mg total) by mouth 2 (two) times daily. Take with or immediately following a meal., Disp: 180 tablet, Rfl: 2  EXAM:  GENERAL: alert, oriented, appears well and in no acute distress  HEENT: atraumatic, conjunttiva clear, no obvious abnormalities on inspection of external nose and ears  NECK: normal movements of the head and neck  LUNGS: on inspection no signs of respiratory distress, breathing rate appears normal, no obvious gross SOB, gasping or wheezing  CV: no obvious cyanosis  PSYCH/NEURO: pleasant and cooperative, no obvious depression or anxiety, speech and thought processing grossly intact  ASSESSMENT AND PLAN:  Discussed the following assessment and plan:  Coronary artery disease involving native coronary artery of native heart without angina pectoris S/p CABG and AVR.  Going to cardiac rehab.  Breathing better.  Continue risk factor modification.   Diabetes Low carb diet and exercise.  Discussed the need to check blood sugars.  Follow met b and a1c.   Hyperlipidemia LDL goal <70 On lipitor.  Low cholesterol diet and exercise.  Follow lipid panel and liver function tests.    Hypertension Blood pressure elevated.  On losartan and metoprolol.  Will add amlodipine '5mg'$  q day.  Was on this previously and tolerated.  Follow pressures and send in readings.  Schedule f/u soon to reassess.   Daytime somnolence Wakes up - not feeling rested.  Reports increased - nocturia.  Concern over possible sleep  apnea.  Discussed with him.  He is agreeable for referral for evaluation for possible sleep apnea.      I discussed the assessment and treatment plan with the patient. The patient was provided an opportunity to ask questions and all were answered. The patient agreed with the plan and demonstrated an understanding of the instructions.   The patient was advised to call back or seek an in-person evaluation if the symptoms worsen or if the condition fails to improve as anticipated.  I provided 21 minutes of non-face-to-face time during this encounter.   Einar Pheasant, MD

## 2019-05-30 ENCOUNTER — Encounter: Payer: Medicare HMO | Admitting: *Deleted

## 2019-05-30 ENCOUNTER — Other Ambulatory Visit: Payer: Self-pay

## 2019-05-30 DIAGNOSIS — M109 Gout, unspecified: Secondary | ICD-10-CM | POA: Diagnosis not present

## 2019-05-30 DIAGNOSIS — I110 Hypertensive heart disease with heart failure: Secondary | ICD-10-CM | POA: Diagnosis not present

## 2019-05-30 DIAGNOSIS — Z951 Presence of aortocoronary bypass graft: Secondary | ICD-10-CM

## 2019-05-30 DIAGNOSIS — Z7982 Long term (current) use of aspirin: Secondary | ICD-10-CM | POA: Diagnosis not present

## 2019-05-30 DIAGNOSIS — I255 Ischemic cardiomyopathy: Secondary | ICD-10-CM | POA: Diagnosis not present

## 2019-05-30 DIAGNOSIS — I5020 Unspecified systolic (congestive) heart failure: Secondary | ICD-10-CM | POA: Diagnosis not present

## 2019-05-30 DIAGNOSIS — Z79899 Other long term (current) drug therapy: Secondary | ICD-10-CM | POA: Diagnosis not present

## 2019-05-30 DIAGNOSIS — I2510 Atherosclerotic heart disease of native coronary artery without angina pectoris: Secondary | ICD-10-CM | POA: Diagnosis not present

## 2019-05-30 DIAGNOSIS — I350 Nonrheumatic aortic (valve) stenosis: Secondary | ICD-10-CM | POA: Diagnosis not present

## 2019-05-30 NOTE — Progress Notes (Addendum)
Daily Session Note  Patient Details  Name: [name] [name] MRN: [id number] Date of Birth: Jul 27, 1930 Referring Provider:     Cardiac Rehab from 04/23/2019 in [facility] Cardiac and Pulmonary Rehab  Referring Provider  End, Harrell Gave MD      Encounter Date: 05/30/2019  Check In: Session Check In - 05/30/19 1059      Check-In   Supervising physician immediately available to respond to emergencies  See telemetry face sheet for immediately available ER MD    Location  [facility]-Cardiac & Pulmonary Rehab    Staff Present  Renita Papa, RN BSN;Jeanna Durrell BS, Exercise Physiologist;Joseph Tessie Fass RCP,RRT,BSRT    Virtual Visit  No    Medication changes reported      Yes    Comments  added amlodipine    Fall or balance concerns reported     No    Warm-up and Cool-down  Performed on first and last piece of equipment    Resistance Training Performed  Yes    VAD Patient?  No    PAD/SET Patient?  No      Pain Assessment   Currently in Pain?  No/denies          Social History   Tobacco Use  Smoking Status Never Smoker  Smokeless Tobacco Current User  . Types: Chew  Tobacco Comment   not ready to quit currently    Goals Met:  Independence with exercise equipment Exercise tolerated well No report of cardiac concerns or symptoms Strength training completed today  Goals Unmet:  Not Applicable  Comments: Pt able to follow exercise prescription today without complaint.  Will continue to monitor for progression.    Dr. Emily Filbert is Medical Director for [place] and [facility].

## 2019-06-01 DIAGNOSIS — D2272 Melanocytic nevi of left lower limb, including hip: Secondary | ICD-10-CM | POA: Diagnosis not present

## 2019-06-01 DIAGNOSIS — D692 Other nonthrombocytopenic purpura: Secondary | ICD-10-CM | POA: Diagnosis not present

## 2019-06-01 DIAGNOSIS — D2262 Melanocytic nevi of left upper limb, including shoulder: Secondary | ICD-10-CM | POA: Diagnosis not present

## 2019-06-01 DIAGNOSIS — X32XXXA Exposure to sunlight, initial encounter: Secondary | ICD-10-CM | POA: Diagnosis not present

## 2019-06-01 DIAGNOSIS — D2261 Melanocytic nevi of right upper limb, including shoulder: Secondary | ICD-10-CM | POA: Diagnosis not present

## 2019-06-01 DIAGNOSIS — Z85828 Personal history of other malignant neoplasm of skin: Secondary | ICD-10-CM | POA: Diagnosis not present

## 2019-06-01 DIAGNOSIS — L570 Actinic keratosis: Secondary | ICD-10-CM | POA: Diagnosis not present

## 2019-06-02 ENCOUNTER — Encounter: Payer: Self-pay | Admitting: Internal Medicine

## 2019-06-02 DIAGNOSIS — R400 Somnolence: Secondary | ICD-10-CM | POA: Insufficient documentation

## 2019-06-02 MED ORDER — AMLODIPINE BESYLATE 5 MG PO TABS: 5.0000 mg | ORAL_TABLET | Freq: Every day | ORAL | 0 refills | Status: DC

## 2019-06-02 NOTE — Assessment & Plan Note (Signed)
Wakes up - not feeling rested.  Reports increased - nocturia.  Concern over possible sleep apnea.  Discussed with him.  He is agreeable for referral for evaluation for possible sleep apnea.

## 2019-06-02 NOTE — Assessment & Plan Note (Signed)
Blood pressure elevated.  On losartan and metoprolol.  Will add amlodipine 5mg  q day.  Was on this previously and tolerated.  Follow pressures and send in readings.  Schedule f/u soon to reassess.

## 2019-06-02 NOTE — Assessment & Plan Note (Signed)
On lipitor.  Low cholesterol diet and exercise.  Follow lipid panel and liver function tests.   

## 2019-06-02 NOTE — Assessment & Plan Note (Signed)
Low carb diet and exercise.  Discussed the need to check blood sugars.  Follow met b and a1c.

## 2019-06-02 NOTE — Assessment & Plan Note (Signed)
S/p CABG and AVR.  Going to cardiac rehab.  Breathing better.  Continue risk factor modification.

## 2019-06-11 ENCOUNTER — Other Ambulatory Visit: Payer: Self-pay

## 2019-06-11 ENCOUNTER — Encounter: Payer: Medicare HMO | Admitting: *Deleted

## 2019-06-11 DIAGNOSIS — I255 Ischemic cardiomyopathy: Secondary | ICD-10-CM | POA: Diagnosis not present

## 2019-06-11 DIAGNOSIS — I110 Hypertensive heart disease with heart failure: Secondary | ICD-10-CM | POA: Diagnosis not present

## 2019-06-11 DIAGNOSIS — Z7982 Long term (current) use of aspirin: Secondary | ICD-10-CM | POA: Diagnosis not present

## 2019-06-11 DIAGNOSIS — I350 Nonrheumatic aortic (valve) stenosis: Secondary | ICD-10-CM | POA: Diagnosis not present

## 2019-06-11 DIAGNOSIS — I5020 Unspecified systolic (congestive) heart failure: Secondary | ICD-10-CM | POA: Diagnosis not present

## 2019-06-11 DIAGNOSIS — I2510 Atherosclerotic heart disease of native coronary artery without angina pectoris: Secondary | ICD-10-CM | POA: Diagnosis not present

## 2019-06-11 DIAGNOSIS — Z79899 Other long term (current) drug therapy: Secondary | ICD-10-CM | POA: Diagnosis not present

## 2019-06-11 DIAGNOSIS — Z951 Presence of aortocoronary bypass graft: Secondary | ICD-10-CM

## 2019-06-11 DIAGNOSIS — M109 Gout, unspecified: Secondary | ICD-10-CM | POA: Diagnosis not present

## 2019-06-11 NOTE — Progress Notes (Signed)
Daily Session Note  Patient Details  Name: [name] [name] MRN: [id number] Date of Birth: 1930/09/19 Referring Provider:     Cardiac Rehab from 04/23/2019 in [facility] Cardiac and Pulmonary Rehab  Referring Provider  End, Harrell Gave MD      Encounter Date: 06/11/2019  Check In: Session Check In - 06/11/19 1043      Check-In   Supervising physician immediately available to respond to emergencies  See telemetry face sheet for immediately available ER MD    Location  [facility]-Cardiac & Pulmonary Rehab    Staff Present  Renita Papa, RN BSN;Jessica Bourg, MA, RCEP, CCRP, [place], BS, ACSM CEP, Exercise Physiologist;Joseph [place] RCP,RRT,BSRT    Virtual Visit  No    Medication changes reported      No    Fall or balance concerns reported     No    Warm-up and Cool-down  Performed on first and last piece of equipment    Resistance Training Performed  Yes    VAD Patient?  No    PAD/SET Patient?  No      Pain Assessment   Currently in Pain?  No/denies          Social History   Tobacco Use  Smoking Status Never Smoker  Smokeless Tobacco Current User  . Types: Chew  Tobacco Comment   not ready to quit currently    Goals Met:  Independence with exercise equipment Exercise tolerated well No report of cardiac concerns or symptoms Strength training completed today  Goals Unmet:  Not Applicable  Comments: Pt able to follow exercise prescription today without complaint.  Will continue to monitor for progression.    Dr. Emily Filbert is Medical Director for [place] and [facility].

## 2019-06-12 NOTE — Progress Notes (Signed)
Discharge Progress Report  Patient Details  Name: [name] [name] MRN: [id number] Date of Birth: 04-25-1931 Referring Provider:     Cardiac Rehab from 04/23/2019 in [facility]  Referring Provider  [name] [name] Gave MD       Number of Visits: 13  Reason for Discharge:  Early Exit:  Personal  Smoking History:  Social History   Tobacco Use  Smoking Status Never Smoker  Smokeless Tobacco Current User  . Types: Chew  Tobacco Comment   not ready to quit currently    Diagnosis:  S/P CABG x 3  ADL UCSD:   Initial Exercise Prescription: Initial Exercise Prescription - 04/23/19 1300      Date of Initial Exercise RX and Referring Provider   Date  04/23/19    Referring Provider  [name], Christopher MD      Treadmill   MPH  1.8    Grade  1    Minutes  15    METs  2.63      NuStep   Level  2    SPM  80    Minutes  15    METs  2.6      Recumbant Elliptical   Level  1    RPM  50    Minutes  15    METs  2.6      Prescription Details   Frequency (times per week)  2    Duration  Progress to 30 minutes of continuous aerobic without signs/symptoms of physical distress      Intensity   THRR 40-80% of Max Heartrate  89-119    Ratings of Perceived Exertion  11-13    Perceived Dyspnea  0-4      Progression   Progression  Continue to progress workloads to maintain intensity without signs/symptoms of physical distress.      Resistance Training   Training Prescription  Yes    Weight  4 lbs    Reps  10-15       Discharge Exercise Prescription (Final Exercise Prescription Changes): Exercise Prescription Changes - 06/05/19 1500      Response to Exercise   Blood Pressure (Admit)  160/78    Blood Pressure (Exercise)  174/76    Blood Pressure (Exit)  172/82    Heart Rate (Admit)  80 bpm    Heart Rate (Exercise)  109 bpm    Heart Rate (Exit)  60 bpm    Rating of Perceived Exertion (Exercise)  11    Symptoms  none    Duration  Continue with 30  min of aerobic exercise without signs/symptoms of physical distress.    Intensity  THRR unchanged      Progression   Progression  Continue to progress workloads to maintain intensity without signs/symptoms of physical distress.    Average METs  2.6      Resistance Training   Training Prescription  Yes    Weight  4 lb    Reps  10-15      NuStep   Level  4    Minutes  15    METs  2.8       Functional Capacity: 6 Minute Walk    Row Name 04/23/19 1311         6 Minute Walk   Phase  Initial     Distance  998 feet     Walk Time  6 minutes     # of Rest Breaks  0  MPH  1.89     METS  1.3     RPE  7     VO2 Peak  4.54     Symptoms  No     Resting HR  59 bpm     Resting BP  128/56     Resting Oxygen Saturation   97 %     Exercise Oxygen Saturation  during 6 min walk  96 %     Max Ex. HR  86 bpm     Max Ex. BP  146/74     2 Minute Post BP  124/64        Psychological, QOL, Others - Outcomes: PHQ 2/9: Depression screen [facility] 2/9 04/23/2019 10/09/2018 09/15/2017 11/11/2016 09/14/2016  Decreased Interest 0 0 0 0 0  Down, Depressed, Hopeless 0 0 0 0 0  PHQ - 2 Score 0 0 0 0 0  Altered sleeping 0 - - 0 -  Tired, decreased energy 1 - - 1 -  Change in appetite 0 - - 0 -  Feeling bad or failure about yourself  0 - - 0 -  Trouble concentrating 0 - - 0 -  Moving slowly or fidgety/restless 0 - - 0 -  Suicidal thoughts 0 - - 0 -  PHQ-9 Score 1 - - 1 -  Difficult doing work/chores Somewhat difficult - - - -    Quality of Life: Quality of Life - 04/30/19 1245      Quality of Life   Select  Quality of Life      Quality of Life Scores   Health/Function Pre  28.18 %    Socioeconomic Pre  27 %    Psych/Spiritual Pre  29.14 %    Family Pre  28.8 %    GLOBAL Pre  28.23 %       Personal Goals: Goals established at orientation with interventions provided to work toward goal. Personal Goals and Risk Factors at Admission - 04/23/19 1343      Core Components/Risk  Factors/Patient Goals on Admission    Weight Management  Yes;Weight Loss;Obesity    Intervention  Weight Management: Develop a combined nutrition and exercise program designed to reach desired caloric intake, while maintaining appropriate intake of nutrient and fiber, sodium and fats, and appropriate energy expenditure required for the weight goal.;Weight Management: Provide education and appropriate resources to help participant work on and attain dietary goals.;Weight Management/Obesity: Establish reasonable short term and long term weight goals.;Obesity: Provide education and appropriate resources to help participant work on and attain dietary goals.    Admit Weight  214 lb 11.2 oz (97.4 kg)    Goal Weight: Short Term  209 lb (94.8 kg)    Goal Weight: Long Term  200 lb (90.7 kg)    Expected Outcomes  Short Term: Continue to assess and modify interventions until short term weight is achieved;Long Term: Adherence to nutrition and physical activity/exercise program aimed toward attainment of established weight goal;Weight Loss: Understanding of general recommendations for a balanced deficit meal plan, which promotes 1-2 lb weight loss per week and includes a negative energy balance of [phone] kcal/d;Understanding recommendations for meals to include 15-35% energy as protein, 25-35% energy from fat, 35-60% energy from carbohydrates, less than '200mg'$  of dietary cholesterol, 20-35 gm of total fiber daily;Understanding of distribution of calorie intake throughout the day with the consumption of 4-5 meals/snacks    Tobacco Cessation  Yes    Intervention  Assist the participant in steps to  quit. Provide individualized education and counseling about committing to Tobacco Cessation, relapse prevention, and pharmacological support that can be provided by physician.;Advice worker, assist with locating and accessing local/national Quit Smoking programs, and support quit date choice.    Expected  Outcomes  Short Term: Will demonstrate readiness to quit, by selecting a quit date.;Short Term: Will quit all tobacco product use, adhering to prevention of relapse plan.;Long Term: Complete abstinence from all tobacco products for at least 12 months from quit date.    Heart Failure  Yes    Intervention  Provide a combined exercise and nutrition program that is supplemented with education, support and counseling about heart failure. Directed toward relieving symptoms such as shortness of breath, decreased exercise tolerance, and extremity edema.    Expected Outcomes  Improve functional capacity of life;Short term: Attendance in program 2-3 days a week with increased exercise capacity. Reported lower sodium intake. Reported increased fruit and vegetable intake. Reports medication compliance.;Short term: Daily weights obtained and reported for increase. Utilizing diuretic protocols set by physician.;Long term: Adoption of self-care skills and reduction of barriers for early signs and symptoms recognition and intervention leading to self-care maintenance.    Hypertension  Yes    Intervention  Provide education on lifestyle modifcations including regular physical activity/exercise, weight management, moderate sodium restriction and increased consumption of fresh fruit, vegetables, and low fat dairy, alcohol moderation, and smoking cessation.;Monitor prescription use compliance.    Expected Outcomes  Short Term: Continued assessment and intervention until BP is < 140/14m HG in hypertensive participants. < 130/894mHG in hypertensive participants with diabetes, heart failure or chronic kidney disease.;Long Term: Maintenance of blood pressure at goal levels.    Lipids  Yes    Intervention  Provide education and support for participant on nutrition & aerobic/resistive exercise along with prescribed medications to achieve LDL '70mg'$ , HDL >'40mg'$ .    Expected Outcomes  Short Term: Participant states understanding of  desired cholesterol values and is compliant with medications prescribed. Participant is following exercise prescription and nutrition guidelines.;Long Term: Cholesterol controlled with medications as prescribed, with individualized exercise RX and with personalized nutrition plan. Value goals: LDL < '70mg'$ , HDL > 40 mg.        Personal Goals Discharge: Goals and Risk Factor Review    Row Name 05/14/19 1052 05/14/19 1056 06/11/19 1103         Core Components/Risk Factors/Patient Goals Review   Personal Goals Review  Weight Management/Obesity;Tobacco Cessation;Hypertension  --  Weight Management/Obesity;Tobacco Cessation;Lipids;Hypertension;Heart Failure     Review  ChTedfordas been taking all meds.  He does have a BP cuff at home but it was not accurate.  BP was 138/74 at rest today.  He still chews tobacco.  --  When talking about quitting chew he is considering cutting back. His blood pressure has been high upon checkin but goes down after he exercises. Patient states he is taking all of his prescribed medications. He has gained a little weight since the start of the program and wants to lose weight. He states that he must be eating too much.     Expected Outcomes  --  Short - continue to monitor BP at home - he is working with Dr ScNicki Reapern meds Long - manage risk factors  Short: cut back on chew. Long: quit doing Chew all together.        Exercise Goals and Review: Exercise Goals    Row Name 04/23/19 [id number]  Exercise Goals   Increase Physical Activity  Yes       Intervention  Provide advice, education, support and counseling about physical activity/exercise needs.;Develop an individualized exercise prescription for aerobic and resistive training based on initial evaluation findings, risk stratification, comorbidities and participant's personal goals.       Expected Outcomes  Short Term: Attend rehab on a regular basis to increase amount of physical activity.;Long Term: Add in home  exercise to make exercise part of routine and to increase amount of physical activity.;Long Term: Exercising regularly at least 3-5 days a week.       Increase Strength and Stamina  Yes       Intervention  Provide advice, education, support and counseling about physical activity/exercise needs.;Develop an individualized exercise prescription for aerobic and resistive training based on initial evaluation findings, risk stratification, comorbidities and participant's personal goals.       Expected Outcomes  Short Term: Increase workloads from initial exercise prescription for resistance, speed, and METs.;Long Term: Improve cardiorespiratory fitness, muscular endurance and strength as measured by increased METs and functional capacity (6MWT);Short Term: Perform resistance training exercises routinely during rehab and add in resistance training at home       Able to understand and use rate of perceived exertion (RPE) scale  Yes       Intervention  Provide education and explanation on how to use RPE scale       Expected Outcomes  Short Term: Able to use RPE daily in rehab to express subjective intensity level;Long Term:  Able to use RPE to guide intensity level when exercising independently       Knowledge and understanding of Target Heart Rate Range (THRR)  Yes       Intervention  Provide education and explanation of THRR including how the numbers were predicted and where they are located for reference       Expected Outcomes  Short Term: Able to state/look up THRR;Short Term: Able to use daily as guideline for intensity in rehab;Long Term: Able to use THRR to govern intensity when exercising independently       Able to check pulse independently  Yes       Intervention  Provide education and demonstration on how to check pulse in carotid and radial arteries.;Review the importance of being able to check your own pulse for safety during independent exercise       Expected Outcomes  Short Term: Able to explain  why pulse checking is important during independent exercise;Long Term: Able to check pulse independently and accurately       Understanding of Exercise Prescription  Yes       Intervention  Provide education, explanation, and written materials on patient's individual exercise prescription       Expected Outcomes  Short Term: Able to explain program exercise prescription;Long Term: Able to explain home exercise prescription to exercise independently          Exercise Goals Re-Evaluation: Exercise Goals Re-Evaluation    Row Name 04/25/19 1105 05/10/19 1154 05/14/19 1111 05/23/19 1329 06/05/19 1529     Exercise Goal Re-Evaluation   Exercise Goals Review  Increase Physical Activity;Able to understand and use rate of perceived exertion (RPE) scale;Knowledge and understanding of Target Heart Rate Range (THRR);Understanding of Exercise Prescription;Increase Strength and Stamina  Increase Physical Activity;Increase Strength and Stamina;Able to understand and use rate of perceived exertion (RPE) scale;Able to understand and use Dyspnea scale;Able to check pulse independently;Knowledge and understanding of Target Heart  Rate Range (THRR);Understanding of Exercise Prescription  Increase Physical Activity;Increase Strength and Stamina;Able to understand and use rate of perceived exertion (RPE) scale;Knowledge and understanding of Target Heart Rate Range (THRR);Able to check pulse independently;Understanding of Exercise Prescription  Increase Physical Activity;Increase Strength and Stamina;Understanding of Exercise Prescription  Increase Physical Activity;Increase Strength and Stamina;Able to understand and use rate of perceived exertion (RPE) scale;Knowledge and understanding of Target Heart Rate Range (THRR);Able to check pulse independently;Understanding of Exercise Prescription   Comments  Reviewed RPE scale, THR and program prescription with pt today.  Pt voiced understanding and was given a copy of goals to  take home.  [name] [name] is working at [employer].  He has moved up resistance on NS.  Staff will monitor progress.  Reviewed home exercise with pt today.  Pt plans to walk at home  for exercise.  Reviewed THR, pulse, RPE, sign and symptoms, NTG use, and when to call 911 or MD.  Also discussed weather considerations and indoor options.  Pt voiced understanding.  [name] [name] is doing well in rehab. He is up to level 4 on the BioStep and level 3 on NuStep.  We will continue to monitor his progress.  [name] [name] works in correct HR and RPE range.  He has attended three times in December.  More consistent attendance would help him get better results   Expected Outcomes  Short: Use RPE daily to regulate intensity. Long: Follow program prescription in THR.  Short - attend consistently Long - improve overall MET level  Short - add walking at home 2-3 days per week Long - increase overall stamina  Short: Continue to increase workloads. Long: Continue to improve stamina.  Short - attend consistently Long - improve overall stamina   Row Name 06/11/19 1058             Exercise Goal Re-Evaluation   Exercise Goals Review  Increase Physical Activity;Increase Strength and Stamina       Comments  [name] [name] is going to walk at home and work in the yard for exercise. He is going to try to stay active with the program changes due to COVID.       Expected Outcomes  Short: start exercising at home. Long: maintain an exercise program after [facility].          Nutrition & Weight - Outcomes: Pre Biometrics - 04/23/19 1337      Pre Biometrics   Height  '5\' 10"'$  (1.778 m)    Weight  214 lb 11.2 oz (97.4 kg)    BMI (Calculated)  30.81    Single Leg Stand  14.19 seconds        Nutrition: Nutrition Therapy & Goals - 04/24/19 1202      Nutrition Therapy   Diet  HH, low Na diet    Drug/Food Interactions  Purine/Gout;Statins/Certain Fruits    Protein (specify units)  80g    Fiber  30 grams    Whole Grain Foods  3 servings     Saturated Fats  12 max. grams    Fruits and Vegetables  5 servings/day    Sodium  1.5 grams      Personal Nutrition Goals   Nutrition Goal  ST: Eat three meals a day and swap out at least one snack LT: Increase stamina, get back to ADL PTA    Comments  Eats "accross the board" eats what he has craving for, loves sweets. Wife prepares meals; sometimes will have corn bread with beans, meat and  potatoes, vegetables - usually meals are fried or baked depending on the day; 2 meals a day with snacks. Pt reports most of the time will eat "junk food" - cheetos, potato chips, cream. Will have chips or ice cream before bed. Discussed HH eating. Pt would like to eat more consistent meals and swap out some snacks. Pt reports not having diabetes and has told his doctor of the charting error.      Intervention Plan   Intervention  Prescribe, educate and counsel regarding individualized specific dietary modifications aiming towards targeted core components such as weight, hypertension, lipid management, diabetes, heart failure and other comorbidities.;Nutrition handout(s) given to patient.    Expected Outcomes  Short Term Goal: Understand basic principles of dietary content, such as calories, fat, sodium, cholesterol and nutrients.;Short Term Goal: A plan has been developed with personal nutrition goals set during dietitian appointment.;Long Term Goal: Adherence to prescribed nutrition plan.       Nutrition Discharge: Nutrition Assessments - 04/30/19 1246      MEDFICTS Scores   Pre Score  76       Education Questionnaire Score: Knowledge Questionnaire Score - 04/30/19 1246      Knowledge Questionnaire Score   Pre Score  19/26       Goals reviewed with patient; copy given to patient.

## 2019-06-12 NOTE — Progress Notes (Signed)
Cardiac Individual Treatment Plan  Patient Details  Name: [name] [name] MRN: [id number] Date of Birth: Oct 12, 1930 Referring Provider:     Cardiac Rehab from 04/23/2019 in [facility] Cardiac and Pulmonary Rehab  Referring Provider  End, Harrell Gave MD      Initial Encounter Date:    Cardiac Rehab from 04/23/2019 in [facility] Cardiac and Pulmonary Rehab  Date  04/23/19      Visit Diagnosis: S/P CABG x 3  Patient's Home Medications on Admission:  Current Outpatient Medications:  .  allopurinol (ZYLOPRIM) 100 MG tablet, TAKE 1 TABLET EVERY DAY (Patient taking differently: Take 100 mg by mouth daily. ), Disp: 90 tablet, Rfl: 3 .  amLODipine (NORVASC) 5 MG tablet, Take 1 tablet (5 mg total) by mouth daily., Disp: 30 tablet, Rfl: 0 .  aspirin 325 MG EC tablet, TAKE 1 TABLET BY MOUTH EVERY DAY, Disp: 30 tablet, Rfl: 2 .  atorvastatin (LIPITOR) 80 MG tablet, Take 1 tablet (80 mg total) by mouth every morning., Disp: 90 tablet, Rfl: 1 .  folic acid (FOLVITE) 1 MG tablet, TAKE 1 TABLET BY MOUTH EVERY DAY, Disp: 90 tablet, Rfl: 0 .  LORazepam (ATIVAN) 1 MG tablet, Takes 1/2 to 1 tablet q day prn (Patient taking differently: Take 0.5-1 mg by mouth at bedtime as needed for sleep. ), Disp: 30 tablet, Rfl: 2 .  losartan (COZAAR) 100 MG tablet, TAKE 1 TABLET BY MOUTH EVERY DAY, Disp: 90 tablet, Rfl: 1 .  metoprolol succinate (TOPROL-XL) 25 MG 24 hr tablet, Take 1 tablet (25 mg total) by mouth 2 (two) times daily. Take with or immediately following a meal., Disp: 180 tablet, Rfl: 2  Past Medical History: Past Medical History:  Diagnosis Date  . Aortic stenosis   . Arthritis    right shoulder  . BPH (benign prostatic hypertrophy)   . Coronary artery disease   . Gout   . Heart murmur   . History of chicken pox   . Hypercholesterolemia   . Hypertension   . Systolic heart failure (HCC)    ischemic cardiomyopathy  . Wears dentures    full upper and lower    Tobacco Use: Social History   Tobacco  Use  Smoking Status Never Smoker  Smokeless Tobacco Current User  . Types: Chew  Tobacco Comment   not ready to quit currently    Labs: Recent Review Flowsheet Data    Labs for ITP Cardiac and Pulmonary Rehab Latest Ref Rng & Units 01/25/2019 01/25/2019 01/25/2019 01/25/2019 01/25/2019   Cholestrol 0 - 200 mg/dL - - - - -   LDLCALC 0 - 99 mg/dL - - - - -   HDL >39.00 mg/dL - - - - -   Trlycerides 0.0 - 149.0 mg/dL - - - - -   Hemoglobin A1c 4.8 - 5.6 % - - - - -   PHART 7.350 - 7.450 7.423 7.404 7.413 7.319(L) -   PCO2ART 32.0 - 48.0 mmHg 39.3 33.2 30.7(L) 37.3 -   HCO3 20.0 - 28.0 mmol/L 25.7 21.0 19.8(L) 19.4(L) -   TCO2 22 - 32 mmol/L 27 22 21(L) 21(L) 20(L)   ACIDBASEDEF 0.0 - 2.0 mmol/L - 4.0(H) 4.0(H) 6.0(H) -   O2SAT % 100.0 93.0 96.0 93.0 -       Exercise Target Goals: Exercise Program Goal: Individual exercise prescription set using results from initial 6 min walk test and THRR while considering  patient's activity barriers and safety.   Exercise Prescription Goal: Initial exercise prescription  builds to 30-45 minutes a day of aerobic activity, 2-3 days per week.  Home exercise guidelines will be given to patient during program as part of exercise prescription that the participant will acknowledge.  Activity Barriers & Risk Stratification: Activity Barriers & Cardiac Risk Stratification - 04/23/19 1337      Activity Barriers & Cardiac Risk Stratification   Activity Barriers  Deconditioning;Muscular Weakness;Shortness of Breath;Other (comment);Arthritis;Joint Problems    Comments  gout, Hx frozen shoulder, recently got cortisol shot in left knee for arthriti    Cardiac Risk Stratification  High       6 Minute Walk: 6 Minute Walk    Row Name 04/23/19 1311         6 Minute Walk   Phase  Initial     Distance  998 feet     Walk Time  6 minutes     # of Rest Breaks  0     MPH  1.89     METS  1.3     RPE  7     VO2 Peak  4.54     Symptoms  No     Resting HR  59 bpm      Resting BP  128/56     Resting Oxygen Saturation   97 %     Exercise Oxygen Saturation  during 6 min walk  96 %     Max Ex. HR  86 bpm     Max Ex. BP  146/74     2 Minute Post BP  124/64        Oxygen Initial Assessment:   Oxygen Re-Evaluation:   Oxygen Discharge (Final Oxygen Re-Evaluation):   Initial Exercise Prescription: Initial Exercise Prescription - 04/23/19 1300      Date of Initial Exercise RX and Referring Provider   Date  04/23/19    Referring Provider  End, Harrell Gave MD      Treadmill   MPH  1.8    Grade  1    Minutes  15    METs  2.63      NuStep   Level  2    SPM  80    Minutes  15    METs  2.6      Recumbant Elliptical   Level  1    RPM  50    Minutes  15    METs  2.6      Prescription Details   Frequency (times per week)  2    Duration  Progress to 30 minutes of continuous aerobic without signs/symptoms of physical distress      Intensity   THRR 40-80% of Max Heartrate  89-119    Ratings of Perceived Exertion  11-13    Perceived Dyspnea  0-4      Progression   Progression  Continue to progress workloads to maintain intensity without signs/symptoms of physical distress.      Resistance Training   Training Prescription  Yes    Weight  4 lbs    Reps  10-15       Perform Capillary Blood Glucose checks as needed.  Exercise Prescription Changes: Exercise Prescription Changes    Row Name 04/23/19 1300 05/10/19 1100 05/23/19 1300 06/05/19 1500       Response to Exercise   Blood Pressure (Admit)  128/56  180/80  126/70  160/78    Blood Pressure (Exercise)  146/74  178/80  146/72  174/76    Blood Pressure (  Exit)  124/64  170/84  158/80  172/82    Heart Rate (Admit)  59 bpm  61 bpm  52 bpm  80 bpm    Heart Rate (Exercise)  86 bpm  78 bpm  90 bpm  109 bpm    Heart Rate (Exit)  62 bpm  68 bpm  66 bpm  60 bpm    Oxygen Saturation (Admit)  97 %  --  --  --    Oxygen Saturation (Exercise)  96 %  --  --  --    Rating of Perceived  Exertion (Exercise)  '7  11  13  11    '$ Symptoms  none  none  none  none    Comments  walk test results  --  --  --    Duration  --  --  Continue with 30 min of aerobic exercise without signs/symptoms of physical distress.  Continue with 30 min of aerobic exercise without signs/symptoms of physical distress.    Intensity  --  --  THRR unchanged  THRR unchanged      Progression   Progression  --  Continue to progress workloads to maintain intensity without signs/symptoms of physical distress.  Continue to progress workloads to maintain intensity without signs/symptoms of physical distress.  Continue to progress workloads to maintain intensity without signs/symptoms of physical distress.    Average METs  --  2  2.1  2.6      Resistance Training   Training Prescription  --  Yes  Yes  Yes    Weight  --  4 lb  4 lb  4 lb    Reps  --  10-15  10-15  10-15      Interval Training   Interval Training  --  No  No  --      Treadmill   MPH  --  1.2  --  --    Grade  --  1  --  --    Minutes  --  15  --  --    METs  --  2.08  --  --      NuStep   Level  --  '4  3  4    '$ SPM  --  80  --  --    Minutes  --  '15  15  15    '$ METs  --  2.1  2.1  2.8      Arm Ergometer   Level  --  --  1  --    Minutes  --  --  15  --    METs  --  --  2.2  --      Biostep-RELP   Level  --  --  4  --    Minutes  --  --  15  --    METs  --  --  2  --      Home Exercise Plan   Plans to continue exercise at  --  --  Home (comment) walk  --    Frequency  --  --  Add 2 additional days to program exercise sessions.  --    Initial Home Exercises Provided  --  --  05/13/19  --       Exercise Comments: Exercise Comments    Row Name 04/25/19 1104           Exercise Comments  First full day of exercise!  Patient was oriented to  gym and equipment including functions, settings, policies, and procedures.  Patient's individual exercise prescription and treatment plan were reviewed.  All starting workloads were  established based on the results of the 6 minute walk test done at initial orientation visit.  The plan for exercise progression was also introduced and progression will be customized based on patient's performance and goals.          Exercise Goals and Review: Exercise Goals    Row Name 04/23/19 1342             Exercise Goals   Increase Physical Activity  Yes       Intervention  Provide advice, education, support and counseling about physical activity/exercise needs.;Develop an individualized exercise prescription for aerobic and resistive training based on initial evaluation findings, risk stratification, comorbidities and participant's personal goals.       Expected Outcomes  Short Term: Attend rehab on a regular basis to increase amount of physical activity.;Long Term: Add in home exercise to make exercise part of routine and to increase amount of physical activity.;Long Term: Exercising regularly at least 3-5 days a week.       Increase Strength and Stamina  Yes       Intervention  Provide advice, education, support and counseling about physical activity/exercise needs.;Develop an individualized exercise prescription for aerobic and resistive training based on initial evaluation findings, risk stratification, comorbidities and participant's personal goals.       Expected Outcomes  Short Term: Increase workloads from initial exercise prescription for resistance, speed, and METs.;Long Term: Improve cardiorespiratory fitness, muscular endurance and strength as measured by increased METs and functional capacity (6MWT);Short Term: Perform resistance training exercises routinely during rehab and add in resistance training at home       Able to understand and use rate of perceived exertion (RPE) scale  Yes       Intervention  Provide education and explanation on how to use RPE scale       Expected Outcomes  Short Term: Able to use RPE daily in rehab to express subjective intensity level;Long  Term:  Able to use RPE to guide intensity level when exercising independently       Knowledge and understanding of Target Heart Rate Range (THRR)  Yes       Intervention  Provide education and explanation of THRR including how the numbers were predicted and where they are located for reference       Expected Outcomes  Short Term: Able to state/look up THRR;Short Term: Able to use daily as guideline for intensity in rehab;Long Term: Able to use THRR to govern intensity when exercising independently       Able to check pulse independently  Yes       Intervention  Provide education and demonstration on how to check pulse in carotid and radial arteries.;Review the importance of being able to check your own pulse for safety during independent exercise       Expected Outcomes  Short Term: Able to explain why pulse checking is important during independent exercise;Long Term: Able to check pulse independently and accurately       Understanding of Exercise Prescription  Yes       Intervention  Provide education, explanation, and written materials on patient's individual exercise prescription       Expected Outcomes  Short Term: Able to explain program exercise prescription;Long Term: Able to explain home exercise prescription to exercise independently  Exercise Goals Re-Evaluation : Exercise Goals Re-Evaluation    Row Name 04/25/19 1105 05/10/19 1154 05/14/19 1111 05/23/19 1329 06/05/19 1529     Exercise Goal Re-Evaluation   Exercise Goals Review  Increase Physical Activity;Able to understand and use rate of perceived exertion (RPE) scale;Knowledge and understanding of Target Heart Rate Range (THRR);Understanding of Exercise Prescription;Increase Strength and Stamina  Increase Physical Activity;Increase Strength and Stamina;Able to understand and use rate of perceived exertion (RPE) scale;Able to understand and use Dyspnea scale;Able to check pulse independently;Knowledge and understanding of Target  Heart Rate Range (THRR);Understanding of Exercise Prescription  Increase Physical Activity;Increase Strength and Stamina;Able to understand and use rate of perceived exertion (RPE) scale;Knowledge and understanding of Target Heart Rate Range (THRR);Able to check pulse independently;Understanding of Exercise Prescription  Increase Physical Activity;Increase Strength and Stamina;Understanding of Exercise Prescription  Increase Physical Activity;Increase Strength and Stamina;Able to understand and use rate of perceived exertion (RPE) scale;Knowledge and understanding of Target Heart Rate Range (THRR);Able to check pulse independently;Understanding of Exercise Prescription   Comments  Reviewed RPE scale, THR and program prescription with pt today.  Pt voiced understanding and was given a copy of goals to take home.  Aashir is working at [employer].  He has moved up resistance on NS.  Staff will monitor progress.  Reviewed home exercise with pt today.  Pt plans to walk at home for exercise.  Reviewed THR, pulse, RPE, sign and symptoms, NTG use, and when to call 911 or MD.  Also discussed weather considerations and indoor options.  Pt voiced understanding.  Legrande is doing well in rehab. He is up to level 4 on the BioStep and level 3 on NuStep.  We will continue to monitor his progress.  Eustacio works in correct HR and RPE range.  He has attended three times in December.  More consistent attendance would help him get better results   Expected Outcomes  Short: Use RPE daily to regulate intensity. Long: Follow program prescription in THR.  Short - attend consistently Long - improve overall MET level  Short - add walking at home 2-3 days per week Long - increase overall stamina  Short: Continue to increase workloads. Long: Continue to improve stamina.  Short - attend consistently Long - improve overall stamina   Row Name 06/11/19 1058             Exercise Goal Re-Evaluation   Exercise Goals Review  Increase Physical  Activity;Increase Strength and Stamina       Comments  Binh is going to walk at home and work in the yard for exercise. He is going to try to stay active with the program changes due to COVID.       Expected Outcomes  Short: start exercising at home. Long: maintain an exercise program after [facility].          Discharge Exercise Prescription (Final Exercise Prescription Changes): Exercise Prescription Changes - 06/05/19 1500      Response to Exercise   Blood Pressure (Admit)  160/78    Blood Pressure (Exercise)  174/76    Blood Pressure (Exit)  172/82    Heart Rate (Admit)  80 bpm    Heart Rate (Exercise)  109 bpm    Heart Rate (Exit)  60 bpm    Rating of Perceived Exertion (Exercise)  11    Symptoms  none    Duration  Continue with 30 min of aerobic exercise without signs/symptoms of physical distress.    Intensity  THRR unchanged      Progression   Progression  Continue to progress workloads to maintain intensity without signs/symptoms of physical distress.    Average METs  2.6      Resistance Training   Training Prescription  Yes    Weight  4 lb    Reps  10-15      NuStep   Level  4    Minutes  15    METs  2.8       Nutrition:  Target Goals: Understanding of nutrition guidelines, daily intake of sodium '1500mg'$ , cholesterol '200mg'$ , calories 30% from fat and 7% or less from saturated fats, daily to have 5 or more servings of fruits and vegetables.  Biometrics: Pre Biometrics - 04/23/19 1337      Pre Biometrics   Height  '5\' 10"'$  (1.778 m)    Weight  214 lb 11.2 oz (97.4 kg)    BMI (Calculated)  30.81    Single Leg Stand  14.19 seconds        Nutrition Therapy Plan and Nutrition Goals: Nutrition Therapy & Goals - 04/24/19 1202      Nutrition Therapy   Diet  HH, low Na diet    Drug/Food Interactions  Purine/Gout;Statins/Certain Fruits    Protein (specify units)  80g    Fiber  30 grams    Whole Grain Foods  3 servings    Saturated Fats  12 max. grams     Fruits and Vegetables  5 servings/day    Sodium  1.5 grams      Personal Nutrition Goals   Nutrition Goal  ST: Eat three meals a day and swap out at least one snack LT: Increase stamina, get back to ADL PTA    Comments  Eats "accross the board" eats what he has craving for, loves sweets. Wife prepares meals; sometimes will have corn bread with beans, meat and potatoes, vegetables - usually meals are fried or baked depending on the day; 2 meals a day with snacks. Pt reports most of the time will eat "junk food" - cheetos, potato chips, cream. Will have chips or ice cream before bed. Discussed HH eating. Pt would like to eat more consistent meals and swap out some snacks. Pt reports not having diabetes and has told his doctor of the charting error.      Intervention Plan   Intervention  Prescribe, educate and counsel regarding individualized specific dietary modifications aiming towards targeted core components such as weight, hypertension, lipid management, diabetes, heart failure and other comorbidities.;Nutrition handout(s) given to patient.    Expected Outcomes  Short Term Goal: Understand basic principles of dietary content, such as calories, fat, sodium, cholesterol and nutrients.;Short Term Goal: A plan has been developed with personal nutrition goals set during dietitian appointment.;Long Term Goal: Adherence to prescribed nutrition plan.       Nutrition Assessments: Nutrition Assessments - 04/30/19 1246      MEDFICTS Scores   Pre Score  76       Nutrition Goals Re-Evaluation: Nutrition Goals Re-Evaluation    Row Name 05/16/19 1121 06/12/19 0830           Goals   Nutrition Goal  ST: have healthy snacks on hand to eat when busy so that his energy increases LT: Increase stamina, get back to ADL PTA  ST: have healthy snacks on hand to eat when busy so that his energy increases LT: Increase stamina, get back to ADL PTA  Comment  Pt reports doing well, most significant change was  lowering salt intake: will not add salt to food when it reaches the table. Still has low energy. Pt reports doing better when switching out snacks. Pt reports that when he gets busy he doesn't listen to the hunger whistpers as well and doesn't notice hunger. Discussed having things on hand to eat when busy may help with energy level as he may not be eating enough.  Continue with current changes      Expected Outcome  ST: have healthy snacks on hand to eat when busy so that his energy increases LT: Increase stamina, get back to ADL PTA  ST: have healthy snacks on hand to eat when busy so that his energy increases LT: Increase stamina, get back to ADL PTA         Nutrition Goals Discharge (Final Nutrition Goals Re-Evaluation): Nutrition Goals Re-Evaluation - 06/12/19 0830      Goals   Nutrition Goal  ST: have healthy snacks on hand to eat when busy so that his energy increases LT: Increase stamina, get back to ADL PTA    Comment  Continue with current changes    Expected Outcome  ST: have healthy snacks on hand to eat when busy so that his energy increases LT: Increase stamina, get back to ADL PTA       Psychosocial: Target Goals: Acknowledge presence or absence of significant depression and/or stress, maximize coping skills, provide positive support system. Participant is able to verbalize types and ability to use techniques and skills needed for reducing stress and depression.   Initial Review & Psychosocial Screening: Initial Psych Review & Screening - 04/18/19 1619      Initial Review   Current issues with  Current Stress Concerns;Current Sleep Concerns    Source of Stress Concerns  Unable to participate in former interests or hobbies;Unable to perform yard/household activities    Comments  Currently taking medication to help sleep.  He does have occasional nights that he doesn't sleep as well. He usually is able to get at least 6 hours a night.  He is concerned about some of his lifting  restrictions since surgery and eager to have restrictions lifted.  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      Family Dynamics   Good Support System?  Yes   wife, two daughters live nearby and contact frequently     Barriers   Psychosocial barriers to participate in program  The patient should benefit from training in stress management and relaxation.;Psychosocial barriers identified (see note)      Screening Interventions   Interventions  Encouraged to exercise;To provide support and resources with identified psychosocial needs;Provide feedback about the scores to participant    Expected Outcomes  Short Term goal: Utilizing psychosocial counselor, staff and physician to assist with identification of specific Stressors or current issues interfering with healing process. Setting desired goal for each stressor or current issue identified.;Long Term Goal: Stressors or current issues are controlled or eliminated.;Short Term goal: Identification and review with participant of any Quality of Life or Depression concerns found by scoring the questionnaire.;Long Term goal: The participant improves quality of Life and PHQ9 Scores as seen by post scores and/or verbalization of changes       Quality of Life Scores:  Quality of Life - 04/30/19 1245      Quality of Life   Select  Quality of Life      Quality  of Life Scores   Health/Function Pre  28.18 %    Socioeconomic Pre  27 %    Psych/Spiritual Pre  29.14 %    Family Pre  28.8 %    GLOBAL Pre  28.23 %      Scores of 19 and below usually indicate a poorer quality of life in these areas.  A difference of  2-3 points is a clinically meaningful difference.  A difference of 2-3 points in the total score of the Quality of Life Index has been associated with significant improvement in overall quality of life, self-image, physical symptoms, and general health in studies assessing change in quality of life.  PHQ-9: Recent Review Flowsheet Data     Depression screen [facility] 2/9 04/23/2019 10/09/2018 09/15/2017 11/11/2016 09/14/2016   Decreased Interest 0 0 0 0 0   Down, Depressed, Hopeless 0 0 0 0 0   PHQ - 2 Score 0 0 0 0 0   Altered sleeping 0 - - 0 -   Tired, decreased energy 1 - - 1 -   Change in appetite 0 - - 0 -   Feeling bad or failure about yourself  0 - - 0 -   Trouble concentrating 0 - - 0 -   Moving slowly or fidgety/restless 0 - - 0 -   Suicidal thoughts 0 - - 0 -   PHQ-9 Score 1 - - 1 -   Difficult doing work/chores Somewhat difficult - - - -     Interpretation of Total Score  Total Score Depression Severity:  1-4 = Minimal depression, 5-9 = Mild depression, 10-14 = Moderate depression, 15-19 = Moderately severe depression, 20-27 = Severe depression   Psychosocial Evaluation and Intervention: Psychosocial Evaluation - 04/18/19 1624      Psychosocial Evaluation & Interventions   Interventions  Stress management education;Encouraged to exercise with the program and follow exercise prescription    Comments  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  He is coming in for CABG x3 and AVR which has limited his ability to work his land.  He is eager for his lifting restrictions to be lifted and to get moving again.  He has no other major stressors.  He sleeps good for the most part with help from lorazopam.    Expected Outcomes  Short: Attend rehab to rebuild strength  Long: Continue to stay positive.    Continue Psychosocial Services   Follow up required by staff       Psychosocial Re-Evaluation: Psychosocial Re-Evaluation    [place] Name 05/14/19 1057 06/11/19 1100           Psychosocial Re-Evaluation   Current issues with  Current Stress Concerns  Current Sleep Concerns;Current Stress Concerns      Comments  Sharrieff reports no stress or anxiety.  He doesnt sleep well.  He doesnt sleep as well if he has had a long day.  He does have to get up frequently to use the restroom.  He will talk to Dr Nicki Reaper about that.  He  does have RX for sleep but doesnt take it a lot.  Hoa just purchased a new bed and has already seen improvement in his sleep. He states that he does not have any stress concerns and since last check in he has been doing well.      Expected Outcomes  Short -  talk to Dr about sleep Long - get better rest and continue to manage stress  Short: Attend [facility] stress management education to decrease stress. Long: Maintain exercise Post [facility] to keep stress at a minimum.      Interventions  --  Encouraged to attend Cardiac Rehabilitation for the exercise      Continue Psychosocial Services   --  Follow up required by staff         Psychosocial Discharge (Final Psychosocial Re-Evaluation): Psychosocial Re-Evaluation - 06/11/19 1100      Psychosocial Re-Evaluation   Current issues with  Current Sleep Concerns;Current Stress Concerns    Comments  Cleatus just purchased a new bed and has already seen improvement in his sleep. He states that he does not have any stress concerns and since last check in he has been doing well.    Expected Outcomes  Short: Attend HeartTrack stress management education to decrease stress. Long: Maintain exercise Post HeartTrack to keep stress at a minimum.    Interventions  Encouraged to attend Cardiac Rehabilitation for the exercise    Continue Psychosocial Services   Follow up required by staff       Vocational Rehabilitation: Provide vocational rehab assistance to qualifying candidates.   Vocational Rehab Evaluation & Intervention:   Education: Education Goals: Education classes will be provided on a variety of topics geared toward better understanding of heart health and risk factor modification. Participant will state understanding/return demonstration of topics presented as noted by education test scores.  Learning Barriers/Preferences: Learning Barriers/Preferences - 04/18/19 1617      Learning Barriers/Preferences   Learning Barriers  Sight    wears glasses   Learning Preferences  Verbal Instruction;Written Material   not the best on the computer      Education Topics:  AED/CPR: - Group verbal and written instruction with the use of models to demonstrate the basic use of the AED with the basic ABC's of resuscitation.   General Nutrition Guidelines/Fats and Fiber: -Group instruction provided by verbal, written material, models and posters to present the general guidelines for heart healthy nutrition. Gives an explanation and review of dietary fats and fiber.   Cardiac Rehab from 05/23/2019 in [facility] Cardiac and Pulmonary Rehab  Date  05/09/19  Educator  MC,RD  Instruction Review Code  1- Verbalizes Understanding      Controlling Sodium/Reading Food Labels: -Group verbal and written material supporting the discussion of sodium use in heart healthy nutrition. Review and explanation with models, verbal and written materials for utilization of the food label.   Exercise Physiology & General Exercise Guidelines: - Group verbal and written instruction with models to review the exercise physiology of the cardiovascular system and associated critical values. Provides general exercise guidelines with specific guidelines to those with heart or lung disease.    Aerobic Exercise & Resistance Training: - Gives group verbal and written instruction on the various components of exercise. Focuses on aerobic and resistive training programs and the benefits of this training and how to safely progress through these programs..   Cardiac Rehab from 05/23/2019 in [facility] Cardiac and Pulmonary Rehab  Date  04/25/19  Educator  AS  Instruction Review Code  1- Verbalizes Understanding      Flexibility, Balance, Mind/Body Relaxation: Provides group verbal/written instruction on the benefits of flexibility and balance training, including mind/body exercise modes such as yoga, pilates and tai chi.  Demonstration and skill practice provided.   Cardiac  Rehab from 05/23/2019 in [facility] Cardiac and Pulmonary Rehab  Date  05/23/19  Educator  AS  Instruction Review Code  1-  Verbalizes Understanding      Stress and Anxiety: - Provides group verbal and written instruction about the health risks of elevated stress and causes of high stress.  Discuss the correlation between heart/lung disease and anxiety and treatment options. Review healthy ways to manage with stress and anxiety.   Depression: - Provides group verbal and written instruction on the correlation between heart/lung disease and depressed mood, treatment options, and the stigmas associated with seeking treatment.   Anatomy & Physiology of the Heart: - Group verbal and written instruction and models provide basic cardiac anatomy and physiology, with the coronary electrical and arterial systems. Review of Valvular disease and Heart Failure   Cardiac Procedures: - Group verbal and written instruction to review commonly prescribed medications for heart disease. Reviews the medication, class of the drug, and side effects. Includes the steps to properly store meds and maintain the prescription regimen. (beta blockers and nitrates)   Cardiac Rehab from 05/23/2019 in [facility] Cardiac and Pulmonary Rehab  Date  04/25/19  Educator  SB  Instruction Review Code  1- Verbalizes Understanding      Cardiac Medications I: - Group verbal and written instruction to review commonly prescribed medications for heart disease. Reviews the medication, class of the drug, and side effects. Includes the steps to properly store meds and maintain the prescription regimen.   Cardiac Medications II: -Group verbal and written instruction to review commonly prescribed medications for heart disease. Reviews the medication, class of the drug, and side effects. (all other drug classes)    Go Sex-Intimacy & Heart Disease, Get SMART - Goal Setting: - Group verbal and written instruction through game format to discuss  heart disease and the return to sexual intimacy. Provides group verbal and written material to discuss and apply goal setting through the application of the S.M.A.R.T. Method.   Cardiac Rehab from 05/23/2019 in [facility] Cardiac and Pulmonary Rehab  Date  04/25/19  Educator  SB  Instruction Review Code  1- Verbalizes Understanding      Other Matters of the Heart: - Provides group verbal, written materials and models to describe Stable Angina and Peripheral Artery. Includes description of the disease process and treatment options available to the cardiac patient.   Exercise & Equipment Safety: - Individual verbal instruction and demonstration of equipment use and safety with use of the equipment.   Cardiac Rehab from 05/23/2019 in [facility] Cardiac and Pulmonary Rehab  Date  04/23/19  Educator  [facility]  Instruction Review Code  1- Verbalizes Understanding      Infection Prevention: - Provides verbal and written material to individual with discussion of infection control including proper hand washing and proper equipment cleaning during exercise session.   Cardiac Rehab from 05/23/2019 in [facility] Cardiac and Pulmonary Rehab  Date  04/23/19  Educator  [facility]  Instruction Review Code  1- Verbalizes Understanding      Falls Prevention: - Provides verbal and written material to individual with discussion of falls prevention and safety.   Cardiac Rehab from 05/23/2019 in [facility] Cardiac and Pulmonary Rehab  Date  04/23/19  Educator  [facility]  Instruction Review Code  1- Verbalizes Understanding      Diabetes: - Individual verbal and written instruction to review signs/symptoms of diabetes, desired ranges of glucose level fasting, after meals and with exercise. Acknowledge that pre and post exercise glucose checks will be done for 3 sessions at entry of program.   Know Your Numbers and Risk Factors: -Group verbal and written instruction about  important numbers in your health.  Discussion of what are risk factors  and how they play a role in the disease process.  Review of Cholesterol, Blood Pressure, Diabetes, and BMI and the role they play in your overall health.   Sleep Hygiene: -Provides group verbal and written instruction about how sleep can affect your health.  Define sleep hygiene, discuss sleep cycles and impact of sleep habits. Review good sleep hygiene tips.    Other: -Provides group and verbal instruction on various topics (see comments)   Knowledge Questionnaire Score: Knowledge Questionnaire Score - 04/30/19 1246      Knowledge Questionnaire Score   Pre Score  19/26       Core Components/Risk Factors/Patient Goals at Admission: Personal Goals and Risk Factors at Admission - 04/23/19 1343      Core Components/Risk Factors/Patient Goals on Admission    Weight Management  Yes;Weight Loss;Obesity    Intervention  Weight Management: Develop a combined nutrition and exercise program designed to reach desired caloric intake, while maintaining appropriate intake of nutrient and fiber, sodium and fats, and appropriate energy expenditure required for the weight goal.;Weight Management: Provide education and appropriate resources to help participant work on and attain dietary goals.;Weight Management/Obesity: Establish reasonable short term and long term weight goals.;Obesity: Provide education and appropriate resources to help participant work on and attain dietary goals.    Admit Weight  214 lb 11.2 oz (97.4 kg)    Goal Weight: Short Term  209 lb (94.8 kg)    Goal Weight: Long Term  200 lb (90.7 kg)    Expected Outcomes  Short Term: Continue to assess and modify interventions until short term weight is achieved;Long Term: Adherence to nutrition and physical activity/exercise program aimed toward attainment of established weight goal;Weight Loss: Understanding of general recommendations for a balanced deficit meal plan, which promotes 1-2 lb weight loss per week and includes a negative energy  balance of [phone] kcal/d;Understanding recommendations for meals to include 15-35% energy as protein, 25-35% energy from fat, 35-60% energy from carbohydrates, less than '200mg'$  of dietary cholesterol, 20-35 gm of total fiber daily;Understanding of distribution of calorie intake throughout the day with the consumption of 4-5 meals/snacks    Tobacco Cessation  Yes    Intervention  Assist the participant in steps to quit. Provide individualized education and counseling about committing to Tobacco Cessation, relapse prevention, and pharmacological support that can be provided by physician.;Advice worker, assist with locating and accessing local/national Quit Smoking programs, and support quit date choice.    Expected Outcomes  Short Term: Will demonstrate readiness to quit, by selecting a quit date.;Short Term: Will quit all tobacco product use, adhering to prevention of relapse plan.;Long Term: Complete abstinence from all tobacco products for at least 12 months from quit date.    Heart Failure  Yes    Intervention  Provide a combined exercise and nutrition program that is supplemented with education, support and counseling about heart failure. Directed toward relieving symptoms such as shortness of breath, decreased exercise tolerance, and extremity edema.    Expected Outcomes  Improve functional capacity of life;Short term: Attendance in program 2-3 days a week with increased exercise capacity. Reported lower sodium intake. Reported increased fruit and vegetable intake. Reports medication compliance.;Short term: Daily weights obtained and reported for increase. Utilizing diuretic protocols set by physician.;Long term: Adoption of self-care skills and reduction of barriers for early signs and symptoms recognition and intervention leading to self-care maintenance.    Hypertension  Yes    Intervention  Provide education on lifestyle modifcations including regular physical activity/exercise,  weight management, moderate sodium restriction and increased consumption of fresh fruit, vegetables, and low fat dairy, alcohol moderation, and smoking cessation.;Monitor prescription use compliance.    Expected Outcomes  Short Term: Continued assessment and intervention until BP is < 140/38m HG in hypertensive participants. < 130/858mHG in hypertensive participants with diabetes, heart failure or chronic kidney disease.;Long Term: Maintenance of blood pressure at goal levels.    Lipids  Yes    Intervention  Provide education and support for participant on nutrition & aerobic/resistive exercise along with prescribed medications to achieve LDL '70mg'$ , HDL >'40mg'$ .    Expected Outcomes  Short Term: Participant states understanding of desired cholesterol values and is compliant with medications prescribed. Participant is following exercise prescription and nutrition guidelines.;Long Term: Cholesterol controlled with medications as prescribed, with individualized exercise RX and with personalized nutrition plan. Value goals: LDL < '70mg'$ , HDL > 40 mg.       Core Components/Risk Factors/Patient Goals Review:  Goals and Risk Factor Review    Row Name 05/14/19 1052 05/14/19 1056 06/11/19 1103         Core Components/Risk Factors/Patient Goals Review   Personal Goals Review  Weight Management/Obesity;Tobacco Cessation;Hypertension  --  Weight Management/Obesity;Tobacco Cessation;Lipids;Hypertension;Heart Failure     Review  [place] been taking all meds.  He does have a BP cuff at home but it was not accurate.  BP was 138/74 at rest today.  He still chews tobacco.  --  When talking about quitting chew he is considering cutting back. His blood pressure has been high upon checkin but goes down after he exercises. Patient states he is taking all of his prescribed medications. He has gained a little weight since the start of the program and wants to lose weight. He states that he must be eating too much.      Expected Outcomes  --  Short - continue to monitor BP at home - he is working with Dr ScNicki Reapern meds Long - manage risk factors  Short: cut back on chew. Long: quit doing Chew all together.        Core Components/Risk Factors/Patient Goals at Discharge (Final Review):  Goals and Risk Factor Review - 06/11/19 1103      Core Components/Risk Factors/Patient Goals Review   Personal Goals Review  Weight Management/Obesity;Tobacco Cessation;Lipids;Hypertension;Heart Failure    Review  When talking about quitting chew he is considering cutting back. His blood pressure has been high upon checkin but goes down after he exercises. Patient states he is taking all of his prescribed medications. He has gained a little weight since the start of the program and wants to lose weight. He states that he must be eating too much.    Expected Outcomes  Short: cut back on chew. Long: quit doing Chew all together.       ITP Comments: ITP Comments    Row Name 04/18/19 1637 04/23/19 1218 04/23/19 1345 04/24/19 1225 04/25/19 0606   ITP Comments  Virtual orientation completed.  [facility]  EP eval scheduled for Mon 11/2 at 11am.  Completed 6MWT and gym orientation.  Initial ITP created and sent for review to Dr. MaEmily FilbertMedical Director.  Pt took paperwork home to complete since he did not bring his glasses.  Completed initial RD eval.  30 day review completed. Continue with ITP sent to Dr.  Emily Filbert, Medical Director of Cardiac and Pulmonary Rehab for review , changes as needed and signature.   [place]  Name 05/23/19 [phone] 06/12/19 0950         ITP Comments  30 day review competed . ITP sent to Dr Emily Filbert for review, changes as needed and ITP approval signature.  Called to verify appointment time change.  Pt voiced that he would like to discharge from program at this time.         Comments: Discharge ITP

## 2019-07-06 ENCOUNTER — Other Ambulatory Visit (INDEPENDENT_AMBULATORY_CARE_PROVIDER_SITE_OTHER): Payer: Medicare HMO

## 2019-07-06 ENCOUNTER — Other Ambulatory Visit: Payer: Self-pay

## 2019-07-06 DIAGNOSIS — E785 Hyperlipidemia, unspecified: Secondary | ICD-10-CM | POA: Diagnosis not present

## 2019-07-06 DIAGNOSIS — I10 Essential (primary) hypertension: Secondary | ICD-10-CM

## 2019-07-06 DIAGNOSIS — D649 Anemia, unspecified: Secondary | ICD-10-CM

## 2019-07-06 DIAGNOSIS — E1159 Type 2 diabetes mellitus with other circulatory complications: Secondary | ICD-10-CM

## 2019-07-06 LAB — BASIC METABOLIC PANEL
BUN: 12 mg/dL (ref 6–23)
CO2: 28 mEq/L (ref 19–32)
Calcium: 9.4 mg/dL (ref 8.4–10.5)
Chloride: 104 mEq/L (ref 96–112)
Creatinine, Ser: 1.07 mg/dL (ref 0.40–1.50)
GFR: 65.39 mL/min (ref >60.00)
Glucose, Bld: 102 mg/dL — ABNORMAL HIGH (ref 70–99)
Potassium: 3.4 mEq/L — ABNORMAL LOW (ref 3.5–5.1)
Sodium: 141 mEq/L (ref 135–145)

## 2019-07-06 LAB — LIPID PANEL
Cholesterol: 128 mg/dL (ref 0–200)
HDL: 49.4 mg/dL (ref >39.00)
LDL Cholesterol: 68 mg/dL (ref 0–99)
NonHDL: 78.75
Total CHOL/HDL Ratio: 3
Triglycerides: 53 mg/dL (ref 0.0–149.0)
VLDL: 10.6 mg/dL (ref 0.0–40.0)

## 2019-07-06 LAB — CBC WITH DIFFERENTIAL/PLATELET
Basophils Absolute: 0.1 10*3/uL (ref 0.0–0.1)
Basophils Relative: 0.9 % (ref 0.0–3.0)
Eosinophils Absolute: 0.7 10*3/uL (ref 0.0–0.7)
Eosinophils Relative: 6.4 % — ABNORMAL HIGH (ref 0.0–5.0)
HCT: 39.6 % (ref 39.0–52.0)
Hemoglobin: 13.5 g/dL (ref 13.0–17.0)
Lymphocytes Relative: 50.2 % — ABNORMAL HIGH (ref 12.0–46.0)
Lymphs Abs: 5.3 10*3/uL — ABNORMAL HIGH (ref 0.7–4.0)
MCHC: 34.1 g/dL (ref 30.0–36.0)
MCV: 93.8 fl (ref 78.0–100.0)
Monocytes Absolute: 0.7 10*3/uL (ref 0.1–1.0)
Monocytes Relative: 6.8 % (ref 3.0–12.0)
Neutro Abs: 3.8 10*3/uL (ref 1.4–7.7)
Neutrophils Relative %: 35.7 % — ABNORMAL LOW (ref 43.0–77.0)
Platelets: 192 10*3/uL (ref 150.0–400.0)
RBC: 4.22 Mil/uL (ref 4.22–5.81)
RDW: 15.1 % (ref 11.5–15.5)
WBC: 10.7 10*3/uL — ABNORMAL HIGH (ref 4.0–10.5)

## 2019-07-06 LAB — HEPATIC FUNCTION PANEL
ALT: 11 U/L (ref 0–53)
AST: 20 U/L (ref 0–37)
Albumin: 4.2 g/dL (ref 3.5–5.2)
Alkaline Phosphatase: 80 U/L (ref 39–117)
Bilirubin, Direct: 0.2 mg/dL (ref 0.0–0.3)
Total Bilirubin: 0.8 mg/dL (ref 0.2–1.2)
Total Protein: 6.4 g/dL (ref 6.0–8.3)

## 2019-07-06 LAB — FERRITIN: Ferritin: 62.5 ng/mL (ref 22.0–322.0)

## 2019-07-06 LAB — HEMOGLOBIN A1C: Hgb A1c MFr Bld: 5.8 % (ref 4.6–6.5)

## 2019-07-06 LAB — TSH: TSH: 4.34 u[IU]/mL (ref 0.35–4.50)

## 2019-07-09 ENCOUNTER — Other Ambulatory Visit: Payer: Self-pay

## 2019-07-09 NOTE — Telephone Encounter (Signed)
Please review for refill on aspirin 325 mg. The patient was told at the last office visit with Dr. Saunders Revel to take for at least 3 months before decreasing to aspirin 81 mg. Please contact patient for instructions.

## 2019-07-09 NOTE — Progress Notes (Signed)
Follow-up Outpatient Visit Date: 07/11/2019  Primary Care Provider: Einar Pheasant, [place] [id number] [place] [id number]  Chief Complaint: Follow-up valvular heart disease and coronary artery disease  HPI:  [name] is a 86 y.o. male with history of coronary artery disease and systolic heart failure secondary to ischemic cardiomyopathy complicated by severe aortic stenosis status post CABG(LIMA to LAD and sequential SVG to RPDA and RPL) and bioprosthetic aortic valve replacementon8/11/2018, hypertension, hyperlipidemia, diabetes mellitus, asthma, arthritis, gout, and BPH, who presents for follow-up of CAD, cardiomyopathy, and valvular heart disease.  I last saw Mr. Garrand in 03/2019, at which time he was feeling relatively well.  He noted occasional fatigue.  We agreed to decrease metoprolol tartrate to 25 mg BID and increase losartan to 25 mg daily to see if his fatigue would improve.  Today, Mr. Horacek reports that he is doing well.  He has not had any shortness of breath, chest pain, or palpitations.  He notes occasional lightheadedness that seems to resolve with as needed allergy pills.  He has noticed some swelling in both ankles and calves over the last 6 weeks, though this has improved with use of compression stockings.  He notes that amlodipine was restarted by his PCP around that time for blood pressure control.  Home blood pressures have remained elevated in spite of this.  He also notes easy bruising and inquires about reducing aspirin to 81 mg daily.  --------------------------------------------------------------------------------------------------  Past Medical History:  Diagnosis Date  . Aortic stenosis   . Arthritis    right shoulder  . BPH (benign prostatic hypertrophy)   . Coronary artery disease   . Gout   . Heart murmur   . History of chicken pox   . Hypercholesterolemia   . Hypertension   . Systolic heart failure (HCC)    ischemic  cardiomyopathy  . Wears dentures    full upper and lower   Past Surgical History:  Procedure Laterality Date  . AORTIC VALVE REPLACEMENT N/A 01/25/2019   Procedure: AORTIC VALVE REPLACEMENT (AVR) 70mm Inspiris valve;  Surgeon: Gaye Pollack, MD;  Location: [facility];  Service: Open Heart Surgery;  Laterality: N/A;  . arm surgery     right arm fx s/p "plate insertion"  . CARDIAC CATHETERIZATION    . CATARACT EXTRACTION W/PHACO Right 11/17/2016   Procedure: CATARACT EXTRACTION PHACO AND INTRAOCULAR LENS PLACEMENT ([place])  Right;  Surgeon: Leandrew Koyanagi, MD;  Location: [place];  Service: Ophthalmology;  Laterality: Right;  . CORONARY ARTERY BYPASS GRAFT N/A 01/25/2019   LIMA-LAD and sequential SVG-rPDA-rPL  . DENTAL SURGERY     all teeth extracted  . RIGHT HEART CATH AND CORONARY ANGIOGRAPHY N/A 01/02/2019   Procedure: RIGHT HEART CATH AND CORONARY ANGIOGRAPHY;  Surgeon: Nelva Bush, MD;  Location: [facility];  Service: Cardiovascular;  Laterality: N/A;  . TEE WITHOUT CARDIOVERSION N/A 01/25/2019   Procedure: TRANSESOPHAGEAL ECHOCARDIOGRAM (TEE);  Surgeon: Gaye Pollack, MD;  Location: [facility];  Service: Open Heart Surgery;  Laterality: N/A;    Current Meds  Medication Sig  . allopurinol (ZYLOPRIM) 100 MG tablet TAKE 1 TABLET EVERY DAY (Patient taking differently: Take 100 mg by mouth daily. )  . atorvastatin (LIPITOR) 80 MG tablet Take 1 tablet (80 mg total) by mouth every morning.  . folic acid (FOLVITE) 1 MG tablet TAKE 1 TABLET BY MOUTH EVERY DAY  . LORazepam (ATIVAN) 1 MG tablet Takes 1/2 to 1 tablet q day prn (  Patient taking differently: Take 0.5-1 mg by mouth at bedtime as needed for sleep. )  . losartan (COZAAR) 100 MG tablet TAKE 1 TABLET BY MOUTH EVERY DAY  . metoprolol succinate (TOPROL-XL) 25 MG 24 hr tablet Take 1 tablet (25 mg total) by mouth 2 (two) times daily. Take with or immediately following a meal.  . [DISCONTINUED] amLODipine (NORVASC) 5 MG  tablet Take 1 tablet (5 mg total) by mouth daily.  . [DISCONTINUED] aspirin 325 MG EC tablet TAKE 1 TABLET BY MOUTH EVERY DAY    Allergies: Patient has no known allergies.  Social History   Tobacco Use  . Smoking status: Never Smoker  . Smokeless tobacco: Current User    Types: Chew  . Tobacco comment: not ready to quit currently  Substance Use Topics  . Alcohol use: No    Alcohol/week: 0.0 standard drinks  . Drug use: No    Family History  Problem Relation Age of Onset  . Leukemia Father   . Congestive Heart Failure Mother   . Cancer Daughter        Breast Cancer  . Prostate cancer Neg Hx   . Colon cancer Neg Hx     Review of Systems: A 12-system review of systems was performed and was negative except as noted in the HPI.  --------------------------------------------------------------------------------------------------  Physical Exam: BP (!) 160/84 (BP Location: Left Arm, Patient Position: Sitting, Cuff Size: Normal)   Pulse 68   Ht 5\' 11"  (1.803 m)   Wt 223 lb (101.2 kg)   SpO2 98%   BMI 31.10 kg/m   General: NAD. HEENT: No conjunctival pallor or scleral icterus. Facemask in place. Neck: Supple without lymphadenopathy, thyromegaly, JVD, or HJR. Lungs: Normal work of breathing. Clear to auscultation bilaterally without wheezes or crackles. Heart: Regular rate and rhythm with 1/6 systolic murmur.  No rubs or gallops.  Median sternotomy is well-healed. Abd: Bowel sounds present. Soft, NT/ND without hepatosplenomegaly Ext: 2+ pretibial edema. Radial, PT, and DP pulses are 2+ bilaterally. Skin: Warm and dry without rash.  EKG: Normal sinus rhythm with left axis deviation and left ventricular hypertrophy.  No significant change since 04/11/2019.  Lab Results  Component Value Date   WBC 10.7 (H) 07/06/2019   HGB 13.5 07/06/2019   HCT 39.6 07/06/2019   MCV 93.8 07/06/2019   PLT 192.0 07/06/2019    Lab Results  Component Value Date   NA 141 07/06/2019   K  3.4 (L) 07/06/2019   CL 104 07/06/2019   CO2 28 07/06/2019   BUN 12 07/06/2019   CREATININE 1.07 07/06/2019   GLUCOSE 102 (H) 07/06/2019   ALT 11 07/06/2019    Lab Results  Component Value Date   CHOL 128 07/06/2019   HDL 49.40 07/06/2019   LDLCALC 68 07/06/2019   TRIG 53.0 07/06/2019   CHOLHDL 3 07/06/2019    --------------------------------------------------------------------------------------------------  ASSESSMENT AND PLAN: Coronary artery disease: Mr. Leisy has recovered well from his two-vessel CABG 01/2019.  He denies symptoms of worsening coronary insufficiency.  As he is.  In 3 months out from his CABG/AVR, I think it is reasonable to decrease aspirin to 81 mg daily, particularly in light of his easy bruising.  Continue statin therapy for secondary prevention.  Aortic stenosis status post bioprosthetic AVR: No signs or symptoms to suggest valve dysfunction.  As above, we will decrease aspirin to 81 mg daily.  Portance of SBE prophylaxis discussed, though Mr. Ipock is edentulous with dentures.  Chronic systolic heart failure  due to ischemic cardiomyopathy: Mr. Deuschle notes some increasing lower extremity edema over the last 4 to 6 weeks, which could be attributed to addition of amlodipine around that time.  He does not report symptoms consistent with worsening heart failure.  I have recommended decreasing amlodipine to 2.5 mg daily and adding triamterene/HCTZ 37.5-25 milligrams daily.  We will also schedule an echo to be repeated when Mr. Mettler returns for follow-up in about a month.  Continue current doses of metoprolol and losartan.  Hypertension: Blood pressure suboptimally controlled, prompting recent addition of amlodipine.  With this, leg edema has worsened.  I have advised Mr. Bussiere to decrease amlodipine to 2.5 mg daily.  We will continue current doses of metoprolol and losartan.  We have agreed to add triamterene/HCTZ 37.5-25 mg daily, with repeat BMP to be  drawn when he returns for follow-up in about a month.  Hyperlipidemia: LDL at goal.  Continue atorvastatin 80 mg daily.  Follow-up: Return to clinic in 1 month with echo at that time.  Nelva Bush, MD 07/12/2019 8:03 PM

## 2019-07-10 ENCOUNTER — Other Ambulatory Visit: Payer: Self-pay

## 2019-07-10 ENCOUNTER — Ambulatory Visit (INDEPENDENT_AMBULATORY_CARE_PROVIDER_SITE_OTHER): Payer: Medicare HMO | Admitting: Internal Medicine

## 2019-07-10 DIAGNOSIS — E1159 Type 2 diabetes mellitus with other circulatory complications: Secondary | ICD-10-CM | POA: Diagnosis not present

## 2019-07-10 DIAGNOSIS — R400 Somnolence: Secondary | ICD-10-CM | POA: Diagnosis not present

## 2019-07-10 DIAGNOSIS — E876 Hypokalemia: Secondary | ICD-10-CM

## 2019-07-10 DIAGNOSIS — R600 Localized edema: Secondary | ICD-10-CM | POA: Diagnosis not present

## 2019-07-10 DIAGNOSIS — I10 Essential (primary) hypertension: Secondary | ICD-10-CM | POA: Diagnosis not present

## 2019-07-10 DIAGNOSIS — D649 Anemia, unspecified: Secondary | ICD-10-CM | POA: Diagnosis not present

## 2019-07-10 DIAGNOSIS — E785 Hyperlipidemia, unspecified: Secondary | ICD-10-CM

## 2019-07-10 DIAGNOSIS — I2510 Atherosclerotic heart disease of native coronary artery without angina pectoris: Secondary | ICD-10-CM | POA: Diagnosis not present

## 2019-07-10 NOTE — Telephone Encounter (Signed)
Patient has OV with Dr End scheduled for tomorrow 07/11/19. Will see it is addressed at that time.

## 2019-07-10 NOTE — Progress Notes (Signed)
Patient ID: [name] [name], male   DOB: [birth year], 86 y.o.   MRN: [id number]   Virtual Visit via telephone Note  This visit type was conducted due to national recommendations for restrictions regarding the COVID-19 pandemic (e.g. social distancing).  This format is felt to be most appropriate for this patient at this time.  All issues noted in this document were discussed and addressed.  No physical exam was performed (except for noted visual exam findings with Video Visits).   I connected with [name] [name] by a video enabled telemedicine application or telephone and verified that I am speaking with the correct person using two identifiers. Location patient: home Location provider: work or home office Persons participating in the virtual visit: patient, provider  I discussed the limitations, risks, security and privacy concerns of performing an evaluation and management service by telephone and the availability of in person appointments have been discussed.  The patient expressed understanding and agreed to proceed.   Reason for visit: scheduled follow up.   HPI: He reports he is doing relatively well.  Was going to cardiac rehab.  Has decided to stop going.  He has mapped out a walking path at his home - 3x around his yard is one mile.  Plans to walk/exercise at home.  No chest pain.  No sob.  No acid reflux.  No abdominal pain.  Bowels moving.  Blood pressure was elevated. He is on losartan.  Added back 64m of amlodipine last visit.  He had been on this medication for years prior to his heart surgery.  He has not been checking his blood pressure, but will start.  No headache.  No dizziness or light headedness.  Discussed labs.  Potassium slightly decreased.  Discussed potassium rich foods.  He does report some feet and ankle swelling.  No swelling extending up his legs.  No redness.  Was swelling prior to amlodipine.  Swelling is better in am or if props his feet up.  Previously had  discussed evaluation for sleep apnea.  Reports nocturia. See last note.     ROS: See pertinent positives and negatives per HPI.  Past Medical History:  Diagnosis Date  . Aortic stenosis   . Arthritis    right shoulder  . BPH (benign prostatic hypertrophy)   . Coronary artery disease   . Gout   . Heart murmur   . History of chicken pox   . Hypercholesterolemia   . Hypertension   . Systolic heart failure (HCC)    ischemic cardiomyopathy  . Wears dentures    full upper and lower    Past Surgical History:  Procedure Laterality Date  . AORTIC VALVE REPLACEMENT N/A 01/25/2019   Procedure: AORTIC VALVE REPLACEMENT (AVR) 252mInspiris valve;  Surgeon: [name] PollackMD;  [facility];  Service: Open Heart Surgery;  Laterality: N/A;  . arm surgery     right arm fx s/p "plate insertion"  . CARDIAC CATHETERIZATION    . CATARACT EXTRACTION W/PHACO Right 11/17/2016   Procedure: CATARACT EXTRACTION PHACO AND INTRAOCULAR LENS PLACEMENT ([place] Right;  Surgeon: BrLeandrew KoyanagiMD;  [place] [facility] Service: Ophthalmology;  Laterality: Right;  . CORONARY ARTERY BYPASS GRAFT N/A 01/25/2019   LIMA-LAD and sequential SVG-rPDA-rPL  . DENTAL SURGERY     all teeth extracted  . RIGHT HEART CATH AND CORONARY ANGIOGRAPHY N/A 01/02/2019   Procedure: RIGHT HEART CATH AND CORONARY ANGIOGRAPHY;  Surgeon: [name] BushMD;  [place] [facility];  Service: Cardiovascular;  Laterality: N/A;  . TEE WITHOUT CARDIOVERSION N/A 01/25/2019   Procedure: TRANSESOPHAGEAL ECHOCARDIOGRAM (TEE);  Surgeon: [name] Pollack, MD;  Location: [facility];  Service: Open Heart Surgery;  Laterality: N/A;    Family History  Problem Relation Age of Onset  . Leukemia Father   . Congestive Heart Failure Mother   . Cancer Daughter        Breast Cancer  . Prostate cancer Neg Hx   . Colon cancer Neg Hx     SOCIAL HX: reviewed.    Current Outpatient Medications:  .  allopurinol (ZYLOPRIM) 100 MG  tablet, TAKE 1 TABLET EVERY DAY (Patient taking differently: Take 100 mg by mouth daily. ), Disp: 90 tablet, Rfl: 3 .  amLODipine (NORVASC) 5 MG tablet, Take 1 tablet (5 mg total) by mouth daily., Disp: 30 tablet, Rfl: 0 .  aspirin 325 MG EC tablet, TAKE 1 TABLET BY MOUTH EVERY DAY, Disp: 30 tablet, Rfl: 2 .  atorvastatin (LIPITOR) 80 MG tablet, Take 1 tablet (80 mg total) by mouth every morning., Disp: 90 tablet, Rfl: 1 .  folic acid (FOLVITE) 1 MG tablet, TAKE 1 TABLET BY MOUTH EVERY DAY, Disp: 90 tablet, Rfl: 0 .  LORazepam (ATIVAN) 1 MG tablet, Takes 1/2 to 1 tablet q day prn (Patient taking differently: Take 0.5-1 mg by mouth at bedtime as needed for sleep. ), Disp: 30 tablet, Rfl: 2 .  losartan (COZAAR) 100 MG tablet, TAKE 1 TABLET BY MOUTH EVERY DAY, Disp: 90 tablet, Rfl: 1 .  metoprolol succinate (TOPROL-XL) 25 MG 24 hr tablet, Take 1 tablet (25 mg total) by mouth 2 (two) times daily. Take with or immediately following a meal., Disp: 180 tablet, Rfl: 2  EXAM:  GENERAL: alert.  Sounds to be in no acute distress.  Answering questions appropriately.    PSYCH/NEURO: pleasant and cooperative, no obvious depression or anxiety, speech and thought processing grossly intact  ASSESSMENT AND PLAN:  Discussed the following assessment and plan:  Diabetes Discussed low carb diet and exercise.  Follow sugars.  a1c 07/06/19 - 5.8.  Follow met b and a1c.    Coronary artery disease involving native coronary artery of native heart without angina pectoris S/p CABG and AVR.  Was going to cardiac rehab.  Has stopped. Plans to exercise at home as outlined.  Follow.  Continue risk factor modification.  Reports no chest pain or increased sob.  Has f/u with Dr [name] [name].    Daytime somnolence Previously reported as outlined in last note.  I had referred him for evaluation of sleep apnea.  Apparently had to be rescheduled.  He has decided not to pursue further w/up at this time.  Discussed importance of  treating sleep apnea and risk of not treating.  He declines at this time.  Will notify me if changes his mind.    Hyperlipidemia LDL goal <70 On lipitor.  Low cholesterol diet and exercise.  Follow lipid panel and liver function tests.    Hypertension On losartan and metoprolol.  Amlodipine added last visit.  Not checking pressures.  Will have him start spot checking pressures.  Follow metabolic panel.  Was having swelling - prior to starting amlodipine.  Will continue for now.  Adjust medication if needed after bp review.    Postoperative anemia hgb 07/06/19 - wnl.    Pedal edema Some swelling in his feet and ankles as outlined.  Improves with feet/leg elevation.  Discussed possible etiologies.  Will start wearing compression hose again. Improved when wearing.  Declines sleep study as outlined.  Will hold on changing off amlodipine.  Follow pressures.     Orders Placed This Encounter  Procedures  . Potassium    Standing Status:   Future    Standing Expiration Date:   07/09/2020    I discussed the assessment and treatment plan with the patient. The patient was provided an opportunity to ask questions and all were answered. The patient agreed with the plan and demonstrated an understanding of the instructions.   The patient was advised to call back or seek an in-person evaluation if the symptoms worsen or if the condition fails to improve as anticipated.  I provided 23 minutes of non-face-to-face time during this encounter.   Einar Pheasant, MD

## 2019-07-10 NOTE — Telephone Encounter (Signed)
From what I can see he should now be decreased down to 81 mg of the aspirin.   Thanks

## 2019-07-11 ENCOUNTER — Ambulatory Visit: Payer: Medicare HMO | Admitting: Internal Medicine

## 2019-07-11 ENCOUNTER — Encounter: Payer: Self-pay | Admitting: Internal Medicine

## 2019-07-11 VITALS — BP 160/84 | HR 68 | Ht 71.0 in | Wt 223.0 lb

## 2019-07-11 DIAGNOSIS — I2510 Atherosclerotic heart disease of native coronary artery without angina pectoris: Secondary | ICD-10-CM | POA: Diagnosis not present

## 2019-07-11 DIAGNOSIS — I350 Nonrheumatic aortic (valve) stenosis: Secondary | ICD-10-CM | POA: Diagnosis not present

## 2019-07-11 DIAGNOSIS — I5022 Chronic systolic (congestive) heart failure: Secondary | ICD-10-CM | POA: Diagnosis not present

## 2019-07-11 DIAGNOSIS — Z952 Presence of prosthetic heart valve: Secondary | ICD-10-CM

## 2019-07-11 DIAGNOSIS — E785 Hyperlipidemia, unspecified: Secondary | ICD-10-CM

## 2019-07-11 DIAGNOSIS — I10 Essential (primary) hypertension: Secondary | ICD-10-CM | POA: Diagnosis not present

## 2019-07-11 DIAGNOSIS — R600 Localized edema: Secondary | ICD-10-CM | POA: Insufficient documentation

## 2019-07-11 MED ORDER — AMLODIPINE BESYLATE 2.5 MG PO TABS: 2.5000 mg | ORAL_TABLET | Freq: Every day | ORAL | 3 refills | Status: DC

## 2019-07-11 MED ORDER — TRIAMTERENE-HCTZ 37.5-25 MG PO TABS: 1.0000 | ORAL_TABLET | Freq: Every day | ORAL | 3 refills | Status: DC

## 2019-07-11 MED ORDER — ASPIRIN EC 81 MG PO TBEC: 81.0000 mg | DELAYED_RELEASE_TABLET | Freq: Every day | ORAL | 3 refills | Status: DC

## 2019-07-11 NOTE — Assessment & Plan Note (Signed)
hgb 07/06/19 - wnl.

## 2019-07-11 NOTE — Assessment & Plan Note (Signed)
On lipitor.  Low cholesterol diet and exercise.  Follow lipid panel and liver function tests.   

## 2019-07-11 NOTE — Assessment & Plan Note (Signed)
Discussed low carb diet and exercise.  Follow sugars.  a1c 07/06/19 - 5.8.  Follow met b and a1c.

## 2019-07-11 NOTE — Patient Instructions (Signed)
Medication Instructions:  Your physician has recommended you make the following change in your medication:  1- DECREASE Aspirin to 81 mg by mouth once a day. 2- DECREASE Amlodipine to 2.5 mg by mouth once a day. 3- START Triamterene/HCTZ 37.5/25 mg by mouth once a day.  *If you need a refill on your cardiac medications before your next appointment, please call your pharmacy*  Lab Work: none If you have labs (blood work) drawn today and your tests are completely normal, you will receive your results only by: Marland Kitchen MyChart Message (if you have MyChart) OR . A paper copy in the mail If you have any lab test that is abnormal or we need to change your treatment, we will call you to review the results.  Testing/Procedures: none  Follow-Up: At [name], you and your health needs are our priority.  As part of our continuing mission to provide you with exceptional heart care, we have created designated Provider Care Teams.  These Care Teams include your primary Cardiologist (physician) and Advanced Practice Providers (APPs -  Physician Assistants and Nurse Practitioners) who all work together to provide you with the care you need, when you need it.  Your next appointment:   1 month(s) with APP  The format for your next appointment:   In Person  Provider:    You may see DR Harrell Gave END or one of the following Advanced Practice Providers on your designated Care Team:    Murray Hodgkins, NP  Christell Faith, PA-C  Marrianne Mood, PA-C

## 2019-07-11 NOTE — Assessment & Plan Note (Signed)
S/p CABG and AVR.  Was going to cardiac rehab.  Has stopped. Plans to exercise at home as outlined.  Follow.  Continue risk factor modification.  Reports no chest pain or increased sob.  Has f/u with Dr Fletcher Anon tomorrow.

## 2019-07-11 NOTE — Assessment & Plan Note (Signed)
Some swelling in his feet and ankles as outlined.  Improves with feet/leg elevation.  Discussed possible etiologies.  Will start wearing compression hose again. Improved when wearing.  Declines sleep study as outlined.  Will hold on changing off amlodipine.  Follow pressures.

## 2019-07-11 NOTE — Assessment & Plan Note (Signed)
Previously reported as outlined in last note.  I had referred him for evaluation of sleep apnea.  Apparently had to be rescheduled.  He has decided not to pursue further w/up at this time.  Discussed importance of treating sleep apnea and risk of not treating.  He declines at this time.  Will notify me if changes his mind.

## 2019-07-11 NOTE — Assessment & Plan Note (Signed)
On losartan and metoprolol.  Amlodipine added last visit.  Not checking pressures.  Will have him start spot checking pressures.  Follow metabolic panel.  Was having swelling - prior to starting amlodipine.  Will continue for now.  Adjust medication if needed after bp review.

## 2019-07-13 ENCOUNTER — Other Ambulatory Visit: Payer: Self-pay

## 2019-07-13 ENCOUNTER — Telehealth: Payer: Self-pay | Admitting: Internal Medicine

## 2019-07-13 DIAGNOSIS — R2243 Localized swelling, mass and lump, lower limb, bilateral: Secondary | ICD-10-CM

## 2019-07-13 DIAGNOSIS — I359 Nonrheumatic aortic valve disorder, unspecified: Secondary | ICD-10-CM

## 2019-07-13 MED ORDER — ASPIRIN EC 81 MG PO TBEC: 81.0000 mg | DELAYED_RELEASE_TABLET | Freq: Every day | ORAL | 0 refills | Status: AC

## 2019-07-13 NOTE — Telephone Encounter (Signed)
End, Harrell Gave, MD  P Cv Div [facility] Triage  Hello,  Could you reach out to Mr. Corbeil and also set him up for an echocardiogram to reassess his LVEF and bioprosthetic aortic valve when he returns for follow-up in 1 month (can be done the same day just before visit), given increasing leg swelling. He should otherwise continue his medications as we discussed at this week's visit. Thanks.  Gerald Stabs

## 2019-07-13 NOTE — Telephone Encounter (Signed)
A different day is fine. If he would rather r/s his follow up appointment to coordinate an echo on the same day that is ok. I think Dr. Saunders Revel was just trying to save him a trip, so whatever the patient is agreeable to.  Thanks!

## 2019-07-13 NOTE — Telephone Encounter (Signed)
I spoke with the patient regarding Dr. Darnelle Bos recommendations. He is agreeable with coming in for an echo to be done in 1 month- he is aware we will try to get this done the same day as his follow up appointment on 2/22.  The patient voices understanding and is agreeable.   He is aware he will be called to schedule.

## 2019-07-13 NOTE — Telephone Encounter (Signed)
-----   Message from Emily Filbert, RN sent at 07/13/2019  9:49 AM EST ----- Please call to schedule an echo to be done on 2/22 (same day as his f/u with Urban Gibson) if possible- per Dr. Saunders Revel  The patient is aware he will be called to scheduled.

## 2019-07-13 NOTE — Telephone Encounter (Signed)
There is no echo tech schedule for 2/22, Will it be okay if we offer another day or do we need to reschedule the follow up for a day he can get both done?

## 2019-07-20 NOTE — Telephone Encounter (Signed)
Addressed at appointment on 07/11/19 with Dr End.

## 2019-07-25 ENCOUNTER — Other Ambulatory Visit: Payer: Self-pay

## 2019-07-25 ENCOUNTER — Ambulatory Visit (INDEPENDENT_AMBULATORY_CARE_PROVIDER_SITE_OTHER): Payer: Medicare HMO

## 2019-07-25 DIAGNOSIS — R2243 Localized swelling, mass and lump, lower limb, bilateral: Secondary | ICD-10-CM

## 2019-07-25 DIAGNOSIS — I359 Nonrheumatic aortic valve disorder, unspecified: Secondary | ICD-10-CM

## 2019-07-25 MED ORDER — PERFLUTREN LIPID MICROSPHERE
1.0000 mL | INTRAVENOUS | Status: AC | PRN
  Administered 2019-07-25: 2 mL via INTRAVENOUS

## 2019-07-30 ENCOUNTER — Other Ambulatory Visit: Payer: Self-pay

## 2019-08-01 ENCOUNTER — Other Ambulatory Visit (INDEPENDENT_AMBULATORY_CARE_PROVIDER_SITE_OTHER): Payer: Medicare HMO

## 2019-08-01 ENCOUNTER — Institutional Professional Consult (permissible substitution): Payer: Medicare HMO | Admitting: Internal Medicine

## 2019-08-01 ENCOUNTER — Other Ambulatory Visit: Payer: Self-pay

## 2019-08-01 DIAGNOSIS — E876 Hypokalemia: Secondary | ICD-10-CM | POA: Diagnosis not present

## 2019-08-01 LAB — POTASSIUM: Potassium: 4.2 mEq/L (ref 3.5–5.1)

## 2019-08-13 ENCOUNTER — Ambulatory Visit: Payer: Medicare HMO | Admitting: Family

## 2019-08-20 ENCOUNTER — Other Ambulatory Visit: Payer: Self-pay

## 2019-08-20 MED ORDER — FOLIC ACID 1 MG PO TABS: 1.0000 mg | ORAL_TABLET | Freq: Every day | ORAL | 0 refills | Status: DC

## 2019-09-11 DIAGNOSIS — H2512 Age-related nuclear cataract, left eye: Secondary | ICD-10-CM | POA: Diagnosis not present

## 2019-10-02 ENCOUNTER — Other Ambulatory Visit: Payer: Self-pay | Admitting: Internal Medicine

## 2019-10-02 NOTE — Telephone Encounter (Signed)
Attempted to schedule.  LMOV to call office.  ° °

## 2019-10-02 NOTE — Telephone Encounter (Signed)
Please schedule overdue F/U with Dr. Saunders Revel or APP. Thank you!

## 2019-10-04 ENCOUNTER — Other Ambulatory Visit: Payer: Self-pay

## 2019-10-04 ENCOUNTER — Ambulatory Visit (INDEPENDENT_AMBULATORY_CARE_PROVIDER_SITE_OTHER): Payer: Medicare HMO | Admitting: Physician Assistant

## 2019-10-04 ENCOUNTER — Encounter: Payer: Self-pay | Admitting: Physician Assistant

## 2019-10-04 VITALS — BP 160/80 | HR 66 | Ht 70.0 in | Wt 219.4 lb

## 2019-10-04 DIAGNOSIS — Z6832 Body mass index (BMI) 32.0-32.9, adult: Secondary | ICD-10-CM | POA: Diagnosis not present

## 2019-10-04 DIAGNOSIS — I10 Essential (primary) hypertension: Secondary | ICD-10-CM | POA: Diagnosis not present

## 2019-10-04 DIAGNOSIS — I2510 Atherosclerotic heart disease of native coronary artery without angina pectoris: Secondary | ICD-10-CM

## 2019-10-04 DIAGNOSIS — E785 Hyperlipidemia, unspecified: Secondary | ICD-10-CM

## 2019-10-04 DIAGNOSIS — Z952 Presence of prosthetic heart valve: Secondary | ICD-10-CM

## 2019-10-04 DIAGNOSIS — R0609 Other forms of dyspnea: Secondary | ICD-10-CM

## 2019-10-04 DIAGNOSIS — I5022 Chronic systolic (congestive) heart failure: Secondary | ICD-10-CM | POA: Diagnosis not present

## 2019-10-04 DIAGNOSIS — R0600 Dyspnea, unspecified: Secondary | ICD-10-CM

## 2019-10-04 DIAGNOSIS — Z79899 Other long term (current) drug therapy: Secondary | ICD-10-CM | POA: Diagnosis not present

## 2019-10-04 MED ORDER — LOSARTAN POTASSIUM 50 MG PO TABS: 50.0000 mg | ORAL_TABLET | Freq: Every day | ORAL | 3 refills | Status: DC

## 2019-10-04 NOTE — Progress Notes (Signed)
Office Visit    Patient Name: [name] [name] Date of Encounter: 10/04/2019  Primary Care Provider:  Einar Pheasant, MD Primary Cardiologist:  No primary care provider on file.  Chief Complaint    Chief Complaint  Patient presents with  . Other    1 month follow up. meds reviewed verbally with patient.     86 year old male with history of CAD, HFrEF 2/2 ICM complicated by severe aortic stenosis s/p CABG and bioprosthetic aortic valve replacement 01/25/2019, hypertension, hyperlipidemia, DM2, asthma, arthritis, gout, and BPH, and who presents for follow-up of CAD, cardiomyopathy, and valvular heart disease.  Past Medical History    Past Medical History:  Diagnosis Date  . Aortic stenosis   . Arthritis    right shoulder  . BPH (benign prostatic hypertrophy)   . Coronary artery disease   . Gout   . Heart murmur   . History of chicken pox   . Hypercholesterolemia   . Hypertension   . Systolic heart failure (HCC)    ischemic cardiomyopathy  . Wears dentures    full upper and lower   Past Surgical History:  Procedure Laterality Date  . AORTIC VALVE REPLACEMENT N/A 01/25/2019   Procedure: AORTIC VALVE REPLACEMENT (AVR) 68mm Inspiris valve;  Surgeon: Gaye Pollack, MD;  Location: [facility];  Service: Open Heart Surgery;  Laterality: N/A;  . arm surgery     right arm fx s/p "plate insertion"  . CARDIAC CATHETERIZATION    . CATARACT EXTRACTION W/PHACO Right 11/17/2016   Procedure: CATARACT EXTRACTION PHACO AND INTRAOCULAR LENS PLACEMENT ([place])  Right;  Surgeon: Leandrew Koyanagi, MD;  Location: [facility];  Service: Ophthalmology;  Laterality: Right;  . CORONARY ARTERY BYPASS GRAFT N/A 01/25/2019   LIMA-LAD and sequential SVG-rPDA-rPL  . DENTAL SURGERY     all teeth extracted  . RIGHT HEART CATH AND CORONARY ANGIOGRAPHY N/A 01/02/2019   Procedure: RIGHT HEART CATH AND CORONARY ANGIOGRAPHY;  Surgeon: Nelva Bush, MD;  Location: [facility];  Service:  Cardiovascular;  Laterality: N/A;  . TEE WITHOUT CARDIOVERSION N/A 01/25/2019   Procedure: TRANSESOPHAGEAL ECHOCARDIOGRAM (TEE);  Surgeon: Gaye Pollack, MD;  Location: [facility];  Service: Open Heart Surgery;  Laterality: N/A;    Allergies  No Known Allergies  History of Present Illness    [name] [name] is a 86 y.o. male with PMH as above.  He has a history of ischemic cardiomyopathy complicated by severe aortic stenosis s/p CABG (LIMA to LAD and sequential SVG to RPDA and RPL) and bioprosthetic aortic valve replacement on 01/25/2019.  On 03/2019, metoprolol tartrate was decreased to 25 mg twice daily 2/2 occasional fatigue and losartan increased to 25 mg daily to see if his fatigue would improve.  When last seen in the office 07/11/2019, he reported doing fairly well.  He had occasional lightheadedness that seem to resolve with as needed allergy pills.  He noted some swelling in both ankles and calves over the last 6 weeks and improved with compression stockings.  Amlodipine was restarted by his PCP around that time for blood pressure.  Home blood pressures remained elevated in spite of this.  He noted easy bruising and inquired about reducing aspirin to 81 mg daily.  It was noted that, as he was 3 months out from his CABG/AVR, it was reasonable to decrease ASA to 81 mg daily in light as of his easy bruising.  Given his lower extremity edema, amlodipine was decreased to 2.5 mg  daily with addition of triamterene/HCTZ 37.5-25 mg daily.  Echo was scheduled and repeat BMET recommended at RTC.  He was continued on metoprolol, losartan, and statin.  Today, he returns to clinic and reports that he has been doing well since last seen in the office.  He reports some fatigue but otherwise denies any sx. He denies any chest pain, palpitations, or racing heart rate.  No reported signs or symptoms consistent with worsening heart failure.  He continues to have elevated pressures with BP today 160/80 and home pressure  is still noted to be elevated.  We reviewed recommendations for BP below 130/80.  He will continue to monitor his pressures at home.  We also reviewed lifestyle recommendations, including Dash diet and fluid intake.  In addition, he reports that he has stopped his losartan.  He is uncertain for the reason that this losartan was discontinued, and on review of EMR, unable to ascertain the reason that this medication was discontinued. He continues on amlodipine and denies any significant edema with this medication.  No signs or symptoms consistent with bleeding.  He otherwise reports medication compliance.  Of note, we reviewed his most recent echo as below.  Home Medications    Prior to Admission medications   Medication Sig Start Date End Date Taking? Authorizing Provider  allopurinol (ZYLOPRIM) 100 MG tablet TAKE 1 TABLET EVERY DAY Patient taking differently: Take 100 mg by mouth daily.  04/12/18  Yes Einar Pheasant, MD  amLODipine (NORVASC) 2.5 MG tablet Take 1 tablet (2.5 mg total) by mouth daily. 07/11/19 10/09/19 Yes End, Harrell Gave, MD  aspirin EC 81 MG tablet Take 1 tablet (81 mg total) by mouth daily. 07/13/19  Yes End, Harrell Gave, MD  atorvastatin (LIPITOR) 80 MG tablet Take 1 tablet (80 mg total) by mouth every morning. 04/30/19  Yes End, Harrell Gave, MD  folic acid (FOLVITE) 1 MG tablet Take 1 tablet (1 mg total) by mouth daily. 08/20/19  Yes End, Harrell Gave, MD  LORazepam (ATIVAN) 1 MG tablet Takes 1/2 to 1 tablet q day prn Patient taking differently: Take 0.5-1 mg by mouth at bedtime as needed for sleep.  12/20/18  Yes Einar Pheasant, MD  metoprolol succinate (TOPROL-XL) 25 MG 24 hr tablet Take 1 tablet (25 mg total) by mouth 2 (two) times daily. Take with or immediately following a meal. 04/11/19  Yes End, Harrell Gave, MD  triamterene-hydrochlorothiazide (MAXZIDE-25) 37.5-25 MG tablet Take 1 tablet by mouth daily. 07/11/19  Yes End, Harrell Gave, MD    Review of Systems    He denies chest  pain, palpitations, dyspnea, pnd, orthopnea, n, v, dizziness, syncope, edema, weight gain, or early satiety.He reports fatigue.  All other systems reviewed and are otherwise negative except as noted above.  Physical Exam    VS:  BP (!) 160/80 (BP Location: Left Arm, Patient Position: Sitting, Cuff Size: Normal)   Pulse 66   Ht 5\' 10"  (1.778 m)   Wt 219 lb 6 oz (99.5 kg)   SpO2 98%   BMI 31.48 kg/m  , BMI Body mass index is 31.48 kg/m. GEN: Well nourished, well developed, in no acute distress. HEENT: normal. Neck: Supple, no JVD, carotid bruits, or masses. Cardiac: RRR, 1/6 systolic murmur. No rubs, or gallops. No clubbing, cyanosis. Trace bilateral lower extremity edema.  Radials/DP/PT 2+ and equal bilaterally.  Respiratory:  Respirations regular and unlabored, clear to auscultation bilaterally. GI: Soft, nontender, nondistended, BS + x 4. MS: no deformity or atrophy. Skin: warm and dry, no rash. Neuro:  Strength and sensation are intact. Psych: Normal affect.  Accessory Clinical Findings    ECG personally reviewed by me today - NSR, 66 bpm, left anterior fascicular block, LVH with repolarization abnormality, QRS widening at 118 ms, no acute change from previous EKG- no acute changes.  VITALS Reviewed today   Temp Readings from Last 3 Encounters:  05/03/19 97.9 F (36.6 C)  04/19/19 (!) 97.5 F (36.4 C)  02/21/19 97.6 F (36.4 C) (Skin)   BP Readings from Last 3 Encounters:  10/04/19 (!) 160/80  07/11/19 (!) 160/84  05/29/19 (!) 158/72   Pulse Readings from Last 3 Encounters:  10/04/19 66  07/11/19 68  05/03/19 67    Wt Readings from Last 3 Encounters:  10/04/19 219 lb 6 oz (99.5 kg)  07/11/19 223 lb (101.2 kg)  05/29/19 213 lb 3.2 oz (96.7 kg)     LABS  reviewed today   CareEverwhere Labs present and most recent? Yes/No: No  Lab Results  Component Value Date   WBC 10.7 (H) 07/06/2019   HGB 13.5 07/06/2019   HCT 39.6 07/06/2019   MCV 93.8 07/06/2019    PLT 192.0 07/06/2019   Lab Results  Component Value Date   CREATININE 1.07 07/06/2019   BUN 12 07/06/2019   NA 141 07/06/2019   K 4.2 08/01/2019   CL 104 07/06/2019   CO2 28 07/06/2019   Lab Results  Component Value Date   ALT 11 07/06/2019   AST 20 07/06/2019   ALKPHOS 80 07/06/2019   BILITOT 0.8 07/06/2019   Lab Results  Component Value Date   CHOL 128 07/06/2019   HDL 49.40 07/06/2019   LDLCALC 68 07/06/2019   TRIG 53.0 07/06/2019   CHOLHDL 3 07/06/2019    Lab Results  Component Value Date   HGBA1C 5.8 07/06/2019   Lab Results  Component Value Date   TSH 4.34 07/06/2019     STUDIES/PROCEDURES reviewed today   Echo 07/25/19 1. Left ventricular ejection fraction, by visual estimation, is 40 to  45%. The left ventricle has mild to moderately decreased function. There  is no left ventricular hypertrophy.  2. Left ventricular diastolic parameters are consistent with Grade I  diastolic dysfunction (impaired relaxation).  3. The left ventricle demonstrates regional wall motion abnormalities.  4. Severe hypokinesis is noted in the Mid to apical anterolateral wall of  the left ventricle (clips 63, 64).  5. Global right ventricle has low normal systolic function.The right  ventricular size is normal. Right vetricular wall thickness was not  assessed.  6. Left atrial size was mildly dilated.  7. Right atrial size was normal.  8. The mitral valve is grossly normal. Trivial mitral valve  regurgitation.  9. The tricuspid valve is normal in structure.  10. The tricuspid valve is normal in structure. Tricuspid valve  regurgitation is not demonstrated.  11. Aortic valve mean gradient measures 7.0 mmHg.  12. Aortic valve peak gradient measures 13.8 mmHg.  13. Aortic valve regurgitation is not visualized.  14. There is a normal functioning bioprosthetic aortic valve present. No  rocking or valve dehiscence is noted.  15. The pulmonic valve was not well visualized.  Pulmonic valve  regurgitation is not visualized.   LHC 01/02/2019 Conclusions: 1. Significant two-vessel coronary artery disease, including 80% ostial LAD stenosis and 80-90% stenosis of large RCA continuation. 2. Mild to moderate disease noted the proximal/mid LAD, RCA, and ramus intermedius. 3. Mildly elevated right heart filling pressures. 4. Moderately elevated left heart filling  pressures. 5. Severe pulmonary hypertension. 6. Moderately reduced Fick cardiac output/index.c Recommendations: 1. Outpatient cardiac surgery consultation, given significant two-vessel coronary artery disease (LAD stenosis not ideal for PCI) and severe aortic stenosis. 2. Continue current dose of torsemide; will plan for BMP early next week and consider escalation of torsemide +/- addition of low-dose ACEI/ARB. 3. Continue current dose of metoprolol. 4. Aggressive secondary prevention of coronary artery disease. 5. Follow-up in the office in two weeks; may need to consider referral to advanced heart failure clinic in [place] if the patient has recurrent heart failure symptoms or he does not tolerate escalation of diuresis/evidence-based heart failure therapy.  Assessment & Plan    CAD involving the native coronary arteries without angina --No recent CP.  As above, s/p CABG and AVR.  Continue current medical management with ASA 81 mg daily, Toprol-XL, and statin.  For reasons unclear, he has discontinued his losartan 100mg  daily.  He denies any ADRs associated with this medication, and I am unable to find reason for discontinuation on review of EMR.  Will restart losartan 50 mg daily and escalate to previous dose of losartan 100 mg daily as tolerated in 2 weeks.  BMET today and in 1 week.  Given his uncontrolled BP today, he is agreeable to escalate his amlodipine to 5 mg daily until he is able to receive his losartan via mail order pharmacy, as it may be several days before his new prescription arrives. He will  let us know if he has associated LEE with increased amlodipine, though he notes his previous LEE improved with triamterene-HCTZ.  HFrEF 2/2 ICM --Denies signs or symptoms of worsening heart failure as above.  Recent RHC as above.  He denies any significant lower extremity edema since starting triamterene-HCTZ.  Given his uncontrolled BP today, we discussed need for restart of losartan, especially as he is uncertain as to the reason this medication was discontinued.  Given he has been off of this medication for some time, we will restart at losartan 50 mg and escalate as tolerated to previous Losartan 100mg  daily with BMET today and in 1 week.  As he prefers that this medication be sent into mail order pharmacy, he is agreeable to escalate his amlodipine from 2.5 mg daily to 5 mg daily until receipt of his losartan 50 mg. He will let us know if he again experiences lower extremity edema with this increase in his amlodipine.  If he does not experience increase in lower extremity edema with his increased amlodipine, consider remaining on amlodipine 5 mg daily going forward for optimal BP control.  Reassess at RTC and with consideration of BP after losartan restart +/- escalation.  Aortic stenosis s/p bioprosthetic AVR -No signs or symptoms suggestive of valvular dysfunction.  Most recent echo as above showing normal function of valve.  He does note some mild fatigue with recommendation for more optimal BP support and restart of losartan as outlined above.  We discussed the importance of SBE prophylaxis, though he is edentulous with dentures as previously noted. Continue ASA and medication management as above.   HTN -BP suboptimally controlled as above.  We will restart losartan at 50 mg daily with BMET today and in 1 week.  If tolerated, escalate to previous losartan 100 mg daily.  Losartan discontinued for unclear reason. He prefers that his medications be sent to a mail order pharmacy; therefore, he is  agreeable to increase his amlodipine to 5 mg daily until receipt of his losartan and will  let us know if he again notes edema following this increase of amlodipine.  Previous LEE reportedly resolved with triamterene/HCTZ.  Discussed DASH diet and volume intake, as well as recommendation for BP <130/80.  Reviewed most recent echo results in detail.  HLD --Continue current statin.  Medication changes: Restart losartan 50 mg daily and escalate to 100 mg daily at RTC if tolerated by renal function and room in BP.  Until he receives losartan via mail order pharmacy, recommended he increase amlodipine 2.5 mg to amlodipine 5 mg daily.  Consider continuing on amlodipine 5 mg daily if no LEE and to be reassessed at RTC. Labs ordered: BMET today and in 1-2 weeks (depends on receipt of Losartan via mail order pharmacy) Studies / Imaging ordered: None Future considerations: Amlodipine 5mg  daily if still room in BP and no LEE with increased dose, losartan 50mg  increase to 100mg  daily if still room in BP after receives new mail order refill  Disposition: RTC 2 weeks  Arvil Chaco, PA-C 10/04/2019

## 2019-10-04 NOTE — Patient Instructions (Addendum)
Medication Instructions:  - Your physician has recommended you make the following change in your medication:   1) Start losartan 50 mg- take 1 tablet by mouth once daily  2) Until you receive your new prescription above from your mail order:  - increase amlodipine 2.5 mg- take 2 tablets (5 mg) by mouth once daily - when you start losartan, decrease amlodipine to 2.5 mg once daily  *If you need a refill on your cardiac medications before your next appointment, please call your pharmacy*   Lab Work: - Your physician recommends that you have lab work today: BMP  If you have labs (blood work) drawn today and your tests are completely normal, you will receive your results only by: Marland Kitchen MyChart Message (if you have MyChart) OR . A paper copy in the mail If you have any lab test that is abnormal or we need to change your treatment, we will call you to review the results.   Testing/Procedures: - none ordered   Follow-Up: At [facility], you and your health needs are our priority.  As part of our continuing mission to provide you with exceptional heart care, we have created designated Provider Care Teams.  These Care Teams include your primary Cardiologist (physician) and Advanced Practice Providers (APPs -  Physician Assistants and Nurse Practitioners) who all work together to provide you with the care you need, when you need it.  We recommend signing up for the patient portal called "MyChart".  Sign up information is provided on this After Visit Summary.  MyChart is used to connect with patients for Virtual Visits (Telemedicine).  Patients are able to view lab/test results, encounter notes, upcoming appointments, etc.  Non-urgent messages can be sent to your provider as well.   To learn more about what you can do with MyChart, go to [email or web address].    Your next appointment:   2 week(s) with repeat lab work that day  The format for your next appointment:   In Person  Provider:     You may see Nelva Bush, MD  or one of the following Advanced Practice Providers on your designated Care Team:    Murray Hodgkins, NP  Christell Faith, PA-C  Marrianne Mood, PA-C    Other Instructions n/a

## 2019-10-05 LAB — BASIC METABOLIC PANEL
BUN/Creatinine Ratio: 19 (ref 10–24)
BUN: 29 mg/dL — ABNORMAL HIGH (ref 8–27)
CO2: 22 mmol/L (ref 20–29)
Calcium: 9.9 mg/dL (ref 8.6–10.2)
Chloride: 104 mmol/L (ref 96–106)
Creatinine, Ser: 1.54 mg/dL — ABNORMAL HIGH (ref 0.76–1.27)
GFR calc Af Amer: 46 mL/min/{1.73_m2} — ABNORMAL LOW (ref >59)
GFR calc non Af Amer: 40 mL/min/{1.73_m2} — ABNORMAL LOW (ref >59)
Glucose: 125 mg/dL — ABNORMAL HIGH (ref 65–99)
Potassium: 4.3 mmol/L (ref 3.5–5.2)
Sodium: 139 mmol/L (ref 134–144)

## 2019-10-08 ENCOUNTER — Telehealth: Payer: Self-pay

## 2019-10-08 NOTE — Telephone Encounter (Signed)
Patient calling to discuss recent testing results  ° °Please call  ° °

## 2019-10-08 NOTE — Telephone Encounter (Signed)
-----   Message from Arvil Chaco, PA-C sent at 10/08/2019  2:26 PM EDT ----- Please let [name] [name] know that his kidney function showed a bump when compared with previous labs. We will hold off / discontinue his restarted losartan in this setting, given we do not want to restart this medication with a bump in kidney function. If he is tolerating his amlodipine 5mg  daily (without increase in swelling), let's instead continue his on amlodipine 5mg  daily over restart of the Losartan.

## 2019-10-08 NOTE — Telephone Encounter (Signed)
Attempted to call patient. LMTCB 10/08/2019   

## 2019-10-09 MED ORDER — AMLODIPINE BESYLATE 2.5 MG PO TABS: 5.0000 mg | ORAL_TABLET | Freq: Every day | ORAL | 1 refills | Status: DC

## 2019-10-09 NOTE — Telephone Encounter (Signed)
Call to patient to discuss lab results and POC.   Pt verbalized understanding and will make medication adjustments as requested.   Rx updated in chart.   Advised pt to call for any further questions or concerns.

## 2019-10-10 ENCOUNTER — Ambulatory Visit: Payer: Medicare HMO

## 2019-10-14 ENCOUNTER — Other Ambulatory Visit: Payer: Self-pay | Admitting: Internal Medicine

## 2019-10-16 ENCOUNTER — Ambulatory Visit (INDEPENDENT_AMBULATORY_CARE_PROVIDER_SITE_OTHER): Payer: Medicare HMO

## 2019-10-16 VITALS — Ht 70.0 in | Wt 219.0 lb

## 2019-10-16 DIAGNOSIS — Z0000 Encounter for general adult medical examination without abnormal findings: Secondary | ICD-10-CM

## 2019-10-16 NOTE — Patient Instructions (Addendum)
  [name] [name] , Thank you for taking time to come for your Medicare Wellness Visit. I appreciate your ongoing commitment to your health goals. Please review the following plan we discussed and let me know if I can assist you in the future.   These are the goals we discussed: Goals    . Follow up with Primary Care Provider     As needed       This is a list of the screening recommended for you and due dates:  Health Maintenance  Topic Date Due  . COVID-19 Vaccine (1) Never done  . Tetanus Vaccine  Never done  . Complete foot exam   11/18/2018  . Urine Protein Check  08/10/2019  . Hemoglobin A1C  01/03/2020  . Flu Shot  01/20/2020  . Eye exam for diabetics  08/19/2020  . Pneumonia vaccines  Completed

## 2019-10-16 NOTE — Progress Notes (Addendum)
Subjective:   [name] [name] is a 86 y.o. male who presents for Medicare Annual/Subsequent preventive examination.  Review of Systems:  No ROS.  Medicare Wellness Virtual Visit.  Visual/audio telehealth visit, UTA vital signs.   Ht/Wt provided.  See social history for additional risk factors.   Cardiac Risk Factors include: advanced age (>17men, >71 women);hypertension;male gender     Objective:    Vitals: Ht 5\' 10"  (1.778 m)   Wt 219 lb (99.3 kg)   BMI 31.42 kg/m   Body mass index is 31.42 kg/m.  Advanced Directives 10/16/2019 04/18/2019 01/26/2019 01/23/2019 01/02/2019 12/11/2018 10/09/2018  Does Patient Have a Medical Advance Directive? No No No No No No No  Would patient like information on creating a medical advance directive? No - Patient declined No - Patient declined No - Patient declined - No - Patient declined No - Patient declined No - Patient declined    Tobacco Social History   Tobacco Use  Smoking Status Never Smoker  Smokeless Tobacco Current User  . Types: Chew  Tobacco Comment   not ready to quit currently     Ready to quit: Not Answered Counseling given: Not Answered Comment: not ready to quit currently   Clinical Intake:  Pre-visit preparation completed: Yes           How often do you need to have someone help you when you read instructions, pamphlets, or other written materials from your doctor or pharmacy?: 1 - Never  Interpreter Needed?: No     Past Medical History:  Diagnosis Date  . Aortic stenosis   . Arthritis    right shoulder  . BPH (benign prostatic hypertrophy)   . Coronary artery disease   . Gout   . Heart murmur   . History of chicken pox   . Hypercholesterolemia   . Hypertension   . Systolic heart failure (HCC)    ischemic cardiomyopathy  . Wears dentures    full upper and lower   Past Surgical History:  Procedure Laterality Date  . AORTIC VALVE REPLACEMENT N/A 01/25/2019   Procedure: AORTIC VALVE REPLACEMENT  (AVR) 51mm Inspiris valve;  Surgeon: Gaye Pollack, MD;  Location: [facility];  Service: Open Heart Surgery;  Laterality: N/A;  . arm surgery     right arm fx s/p "plate insertion"  . CARDIAC CATHETERIZATION    . CATARACT EXTRACTION W/PHACO Right 11/17/2016   Procedure: CATARACT EXTRACTION PHACO AND INTRAOCULAR LENS PLACEMENT ([place])  Right;  Surgeon: Leandrew Koyanagi, MD;  Location: [facility];  Service: Ophthalmology;  Laterality: Right;  . CORONARY ARTERY BYPASS GRAFT N/A 01/25/2019   LIMA-LAD and sequential SVG-rPDA-rPL  . DENTAL SURGERY     all teeth extracted  . RIGHT HEART CATH AND CORONARY ANGIOGRAPHY N/A 01/02/2019   Procedure: RIGHT HEART CATH AND CORONARY ANGIOGRAPHY;  Surgeon: Nelva Bush, MD;  Location: [facility];  Service: Cardiovascular;  Laterality: N/A;  . TEE WITHOUT CARDIOVERSION N/A 01/25/2019   Procedure: TRANSESOPHAGEAL ECHOCARDIOGRAM (TEE);  Surgeon: Gaye Pollack, MD;  Location: [facility];  Service: Open Heart Surgery;  Laterality: N/A;   Family History  Problem Relation Age of Onset  . Leukemia Father   . Congestive Heart Failure Mother   . Cancer Daughter        Breast Cancer  . Prostate cancer Neg Hx   . Colon cancer Neg Hx    Social History   Socioeconomic History  . Marital status: Married  Spouse name: Not on file  . Number of children: Not on file  . Years of education: Not on file  . Highest education level: Not on file  Occupational History  . Not on file  Tobacco Use  . Smoking status: Never Smoker  . Smokeless tobacco: Current User    Types: Chew  . Tobacco comment: not ready to quit currently  Substance and Sexual Activity  . Alcohol use: No    Alcohol/week: 0.0 standard drinks  . Drug use: No  . Sexual activity: Not Currently  Other Topics Concern  . Not on file  Social History Narrative  . Not on file   Social Determinants of Health   Financial Resource Strain:   . Difficulty of Paying Living Expenses:   Food  Insecurity:   . Worried About Charity fundraiser in the Last Year:   . Arboriculturist in the Last Year:   Transportation Needs:   . Film/video editor (Medical):   Marland Kitchen Lack of Transportation (Non-Medical):   Physical Activity:   . Days of Exercise per Week:   . Minutes of Exercise per Session:   Stress:   . Feeling of Stress :   Social Connections:   . Frequency of Communication with Friends and Family:   . Frequency of Social Gatherings with Friends and Family:   . Attends Religious Services:   . Active Member of Clubs or Organizations:   . Attends Archivist Meetings:   Marland Kitchen Marital Status:     Outpatient Encounter Medications as of 10/16/2019  Medication Sig  . allopurinol (ZYLOPRIM) 100 MG tablet TAKE 1 TABLET EVERY DAY (Patient taking differently: Take 100 mg by mouth daily. )  . amLODipine (NORVASC) 2.5 MG tablet Take 2 tablets (5 mg total) by mouth daily.  Marland Kitchen aspirin EC 81 MG tablet Take 1 tablet (81 mg total) by mouth daily.  Marland Kitchen atorvastatin (LIPITOR) 80 MG tablet Take 1 tablet (80 mg total) by mouth every morning.  . folic acid (FOLVITE) 1 MG tablet Take 1 tablet (1 mg total) by mouth daily.  Marland Kitchen LORazepam (ATIVAN) 1 MG tablet Takes 1/2 to 1 tablet q day prn (Patient taking differently: Take 0.5-1 mg by mouth at bedtime as needed for sleep. )  . metoprolol succinate (TOPROL-XL) 25 MG 24 hr tablet Take 1 tablet (25 mg total) by mouth 2 (two) times daily. Take with or immediately following a meal.  . triamterene-hydrochlorothiazide (MAXZIDE-25) 37.5-25 MG tablet TAKE 1 TABLET BY MOUTH EVERY DAY   No facility-administered encounter medications on file as of 10/16/2019.    Activities of Daily Living In your present state of health, do you have any difficulty performing the following activities: 10/16/2019 01/26/2019  Hearing? N N  Vision? N N  Difficulty concentrating or making decisions? N N  Walking or climbing stairs? N Y  Dressing or bathing? N N  Doing errands,  shopping? N N  Preparing Food and eating ? N -  Using the Toilet? N -  In the past six months, have you accidently leaked urine? N -  Do you have problems with loss of bowel control? N -  Managing your Medications? N -  Managing your Finances? N -  Housekeeping or managing your Housekeeping? N -  Some recent data might be hidden    Patient Care Team: Einar Pheasant, MD as PCP - General (Internal Medicine)   Assessment:   This is a routine wellness examination for [place].  Nurse connected with patient 10/16/19 at  9:30 AM EDT by a telephone enabled telemedicine application and verified that I am speaking with the correct person using two identifiers. Patient stated full name and DOB. Patient gave permission to continue with virtual visit. Patient's location was at home and Nurse's location was at [place] office.   Patient is alert and oriented x3. Patient denies difficulty focusing or concentrating. Patient likes to read each night for brain health.   Health Maintenance Due: -Tdap vaccine- discussed; to be completed with doctor in visit or local pharmacy.   -Covid vaccine- series complete. Plans to bring card to office to update.  -Foot Exam- denies any wounds or change. Followed by pcp.  -Hgb A1c- 07/06/19 (5.8) See completed HM at the end of note.   Eye: Visual acuity not assessed. Virtual visit. Followed by their ophthalmologist. Retinopathy- none reported.   Dental: Dentures- yes  Hearing: Demonstrates normal hearing during visit.  Safety:  Patient feels safe at home- yes Patient does have smoke detectors at home- yes Patient does wear sunscreen or protective clothing when in direct sunlight - yes Patient does wear seat belt when in a moving vehicle - yes Patient drives- yes Adequate lighting in walkways free from debris- yes Grab bars and handrails used as appropriate- yes Ambulates with an assistive device- no  Social: Alcohol intake - no     Smokeless tobacco?  Current; not ready to quit Smokers in home? none Illicit drug use? none  Medication: Taking as directed and without issues.  Pill box in use -yes  Self managed - yes   Covid-19: Precautions and sickness symptoms discussed. Wears mask, social distancing, hand hygiene as appropriate.   Activities of Daily Living Patient denies needing assistance with: household chores, feeding themselves, getting from bed to chair, getting to the toilet, bathing/showering, dressing, managing money, or preparing meals.   Discussed the importance of a healthy diet, water intake and the benefits of aerobic exercise.   Physical activity- active around the yard work, 3 acres   Diet:  Regular Water: good intake Caffeine: 2 cups of coffee  Other Providers Patient Care Team: Einar Pheasant, MD as PCP - General (Internal Medicine)  Exercise Activities and Dietary recommendations Current Exercise Habits: Home exercise routine, Type of exercise: walking, Frequency (Times/Week): 6, Intensity: Mild  Goals    . Follow up with Primary Care Provider     As needed       Fall Risk Fall Risk  10/16/2019 04/18/2019 10/09/2018 09/15/2017 09/14/2016  Falls in the past year? 0 0 0 No No  Number falls in past yr: - 0 - - -  Injury with Fall? - 0 - - -  Follow up Falls evaluation completed - - - -   Timed Get Up and Go Performed: no, virtual visit  Depression Screen PHQ 2/9 Scores 10/16/2019 04/23/2019 10/09/2018 09/15/2017  PHQ - 2 Score 0 0 0 0  PHQ- 9 Score - 1 - -    Cognitive Function MMSE - Mini Mental State Exam 09/14/2016 09/15/2015  Orientation to time 5 5  Orientation to Place 5 5  Registration 3 3  Attention/ Calculation 5 5  Recall 3 3  Language- name 2 objects 2 2  Language- repeat 1 1  Language- follow 3 step command 3 3  Language- read & follow direction 1 1  Write a sentence 1 1  Copy design 1 1  Total score 30 30     6CIT Screen 10/16/2019  10/09/2018 09/15/2017  What Year? 0 points 0  points 0 points  What month? 0 points 0 points 0 points  What time? 0 points 0 points 0 points  Count back from 20 - 0 points 0 points  Months in reverse 0 points 0 points 0 points  Repeat phrase - 0 points 0 points  Total Score - 0 0    Immunization History  Administered Date(s) Administered  . Fluad Quad(high Dose 65+) 04/11/2019  . Influenza Split 05/02/2012  . Influenza, High Dose Seasonal PF 03/19/2016, 03/18/2017, 05/22/2018  . Influenza,inj,Quad PF,6+ Mos 05/01/2013, 02/13/2014, 05/08/2015  . Pneumococcal Conjugate-13 10/05/2013  . Pneumococcal Polysaccharide-23 11/05/2014   Screening Tests Health Maintenance  Topic Date Due  . COVID-19 Vaccine (1) Never done  . TETANUS/TDAP  Never done  . FOOT EXAM  11/18/2018  . URINE MICROALBUMIN  08/10/2019  . HEMOGLOBIN A1C  01/03/2020  . INFLUENZA VACCINE  01/20/2020  . OPHTHALMOLOGY EXAM  08/19/2020  . PNA vac Low Risk Adult  Completed       Plan:   Keep all routine maintenance appointments.   Follow up 10/18/19 @ 10:00  Medicare Attestation I have personally reviewed: The patient's medical and social history Their use of alcohol, tobacco or illicit drugs Their current medications and supplements The patient's functional ability including ADLs,fall risks, home safety risks, cognitive, and hearing and visual impairment Diet and physical activities Evidence for depression   I have reviewed and discussed with patient certain preventive protocols, quality metrics, and best practice recommendations.      Varney Biles, LPN  [id number]   Reviewed above information.  Agree with assessment and plan.    Dr Nicki Reaper

## 2019-10-18 ENCOUNTER — Ambulatory Visit (INDEPENDENT_AMBULATORY_CARE_PROVIDER_SITE_OTHER): Payer: Medicare HMO | Admitting: Internal Medicine

## 2019-10-18 ENCOUNTER — Encounter: Payer: Self-pay | Admitting: Internal Medicine

## 2019-10-18 ENCOUNTER — Other Ambulatory Visit: Payer: Self-pay

## 2019-10-18 DIAGNOSIS — I10 Essential (primary) hypertension: Secondary | ICD-10-CM | POA: Diagnosis not present

## 2019-10-18 DIAGNOSIS — Z951 Presence of aortocoronary bypass graft: Secondary | ICD-10-CM | POA: Diagnosis not present

## 2019-10-18 DIAGNOSIS — I77810 Thoracic aortic ectasia: Secondary | ICD-10-CM | POA: Diagnosis not present

## 2019-10-18 DIAGNOSIS — N289 Disorder of kidney and ureter, unspecified: Secondary | ICD-10-CM | POA: Diagnosis not present

## 2019-10-18 DIAGNOSIS — E1159 Type 2 diabetes mellitus with other circulatory complications: Secondary | ICD-10-CM | POA: Diagnosis not present

## 2019-10-18 DIAGNOSIS — E785 Hyperlipidemia, unspecified: Secondary | ICD-10-CM

## 2019-10-18 DIAGNOSIS — Z952 Presence of prosthetic heart valve: Secondary | ICD-10-CM | POA: Diagnosis not present

## 2019-10-18 DIAGNOSIS — R600 Localized edema: Secondary | ICD-10-CM | POA: Diagnosis not present

## 2019-10-18 DIAGNOSIS — E119 Type 2 diabetes mellitus without complications: Secondary | ICD-10-CM | POA: Diagnosis not present

## 2019-10-18 NOTE — Progress Notes (Signed)
Patient ID: [name] [name], male   DOB: [birth year], 86 y.o.   MRN: [id number]   Subjective:    Patient ID: [name] [name], male    DOB: 21-Apr-1931, 86 y.o.   MRN: [id number]  HPI This visit occurred during the SARS-CoV-2 public health emergency.  Safety protocols were in place, including screening questions prior to the visit, additional usage of staff PPE, and extensive cleaning of exam room while observing appropriate contact time as indicated for disinfecting solutions.  Patient here for follow up appt.  Has had concerns regarding elevated blood pressure.  Has a history of CAD, HFrEF and aortic stenosis s/p CABG and bioprosthetic aortic valve replacement 01/25/19.  Is followed by cardiology.  Has recently had blood pressure medications adjusted.  Was placed on amlodipine.  Started noticing some lower extremity swelling and amlodipine was decreased to 2.3m q day and he was started on triam/hctz.  At a recent visit, he was instructed to start losartan - due to persistent elevation in blood pressure.  Had f/u met b drawn and was found to have reduced kidney function with GFR 40 and creatinine 1.54.  Was instructed to hold on taking losartan and amlodipine was increased back up to 53mq day.  He comes in today with his wrist cuff from home.  States blood pressure has been elevated.  He reports no chest pain.  Breathing stable.  No increased sob.  No acid reflux.  No abdominal pain.  Bowels moving.  States sugars - ok.  Taking metoprolol, amlodipine and triam/hctz.  States he never started the losartan and was not taking it when labs drawn.  Had started the triam/hctz a few weeks prior to lab draw.     Past Medical History:  Diagnosis Date  . Aortic stenosis   . Arthritis    right shoulder  . BPH (benign prostatic hypertrophy)   . Coronary artery disease   . Gout   . Heart murmur   . History of chicken pox   . Hypercholesterolemia   . Hypertension   . Systolic heart failure (HCC)    ischemic  cardiomyopathy  . Wears dentures    full upper and lower   Past Surgical History:  Procedure Laterality Date  . AORTIC VALVE REPLACEMENT N/A 01/25/2019   Procedure: AORTIC VALVE REPLACEMENT (AVR) 2551mnspiris valve;  Surgeon: BarGaye PollackD;  Location: [facility];  Service: Open Heart Surgery;  Laterality: N/A;  . arm surgery     right arm fx s/p "plate insertion"  . CARDIAC CATHETERIZATION    . CATARACT EXTRACTION W/PHACO Right 11/17/2016   Procedure: CATARACT EXTRACTION PHACO AND INTRAOCULAR LENS PLACEMENT ([place];  Surgeon: BraLeandrew KoyanagiD;  [facility];  Laterality: Right;  . CORONARY ARTERY BYPASS GRAFT N/A 01/25/2019   LIMA-LAD and sequential SVG-rPDA-rPL  . DENTAL SURGERY     all teeth extracted  . RIGHT HEART CATH AND CORONARY ANGIOGRAPHY N/A 01/02/2019   Procedure: RIGHT HEART CATH AND CORONARY ANGIOGRAPHY;  Surgeon: EndNelva BushD;  [facility];  Service: Cardiovascular;  Laterality: N/A;  . TEE WITHOUT CARDIOVERSION N/A 01/25/2019   Procedure: TRANSESOPHAGEAL ECHOCARDIOGRAM (TEE);  Surgeon: BarGaye PollackD;  [facility];  Laterality: N/A;   Family History  Problem Relation Age of Onset  . Leukemia Father   . Congestive Heart Failure Mother   . Cancer Daughter  Breast Cancer  . Prostate cancer Neg Hx   . Colon cancer Neg Hx    Social History   Socioeconomic History  . Marital status: Married    Spouse name: Not on file  . Number of children: Not on file  . Years of education: Not on file  . Highest education level: Not on file  Occupational History  . Not on file  Tobacco Use  . Smoking status: Never Smoker  . Smokeless tobacco: Current User    Types: Chew  . Tobacco comment: not ready to quit currently  Substance and Sexual Activity  . Alcohol use: No    Alcohol/week: 0.0 standard drinks  . Drug use: No  . Sexual activity: Not Currently   Other Topics Concern  . Not on file  Social History Narrative  . Not on file   Social Determinants of Health   Financial Resource Strain:   . Difficulty of Paying Living Expenses:   Food Insecurity:   . Worried About Charity fundraiser in the Last Year:   . Arboriculturist in the Last Year:   Transportation Needs:   . Film/video editor (Medical):   Marland Kitchen Lack of Transportation (Non-Medical):   Physical Activity:   . Days of Exercise per Week:   . Minutes of Exercise per Session:   Stress:   . Feeling of Stress :   Social Connections:   . Frequency of Communication with Friends and Family:   . Frequency of Social Gatherings with Friends and Family:   . Attends Religious Services:   . Active Member of Clubs or Organizations:   . Attends Archivist Meetings:   Marland Kitchen Marital Status:     Outpatient Encounter Medications as of 10/18/2019  Medication Sig  . allopurinol (ZYLOPRIM) 100 MG tablet TAKE 1 TABLET EVERY DAY (Patient taking differently: Take 100 mg by mouth daily. )  . amLODipine (NORVASC) 2.5 MG tablet Take 2 tablets (5 mg total) by mouth daily.  Marland Kitchen aspirin EC 81 MG tablet Take 1 tablet (81 mg total) by mouth daily.  Marland Kitchen atorvastatin (LIPITOR) 80 MG tablet Take 1 tablet (80 mg total) by mouth every morning.  . folic acid (FOLVITE) 1 MG tablet Take 1 tablet (1 mg total) by mouth daily.  Marland Kitchen LORazepam (ATIVAN) 1 MG tablet Takes 1/2 to 1 tablet q day prn (Patient taking differently: Take 0.5-1 mg by mouth at bedtime as needed for sleep. )  . metoprolol succinate (TOPROL-XL) 25 MG 24 hr tablet Take 1 tablet (25 mg total) by mouth 2 (two) times daily. Take with or immediately following a meal.  . triamterene-hydrochlorothiazide (MAXZIDE-25) 37.5-25 MG tablet TAKE 1 TABLET BY MOUTH EVERY DAY   No facility-administered encounter medications on file as of 10/18/2019.    Review of Systems  Constitutional: Negative for appetite change and unexpected weight change.  HENT:  Negative for congestion and sinus pressure.   Respiratory: Negative for cough and chest tightness.        Breathing stable.    Cardiovascular: Negative for chest pain, palpitations and leg swelling.  Gastrointestinal: Negative for abdominal pain, diarrhea, nausea and vomiting.  Genitourinary: Negative for difficulty urinating and dysuria.  Musculoskeletal: Negative for joint swelling and myalgias.  Skin: Negative for color change and rash.  Neurological: Negative for dizziness, light-headedness and headaches.  Psychiatric/Behavioral: Negative for agitation and dysphoric mood.       Objective:    Physical Exam Constitutional:  General: He is not in acute distress.    Appearance: Normal appearance. He is well-developed.  HENT:     Head: Normocephalic and atraumatic.     Right Ear: External ear normal.     Left Ear: External ear normal.  Eyes:     General: No scleral icterus.       Right eye: No discharge.        Left eye: No discharge.     Conjunctiva/sclera: Conjunctivae normal.  Cardiovascular:     Rate and Rhythm: Normal rate and regular rhythm.  Pulmonary:     Effort: Pulmonary effort is normal. No respiratory distress.     Breath sounds: Normal breath sounds.  Abdominal:     General: Bowel sounds are normal.     Palpations: Abdomen is soft.     Tenderness: There is no abdominal tenderness.  Musculoskeletal:        General: No swelling or tenderness.     Cervical back: Neck supple. No tenderness.  Lymphadenopathy:     Cervical: No cervical adenopathy.  Skin:    Findings: No erythema or rash.  Neurological:     Mental Status: He is alert.  Psychiatric:        Mood and Affect: Mood normal.        Behavior: Behavior normal.     BP (!) 150/80   Pulse 62   Temp (!) 97.3 F (36.3 C) (Temporal)   Ht _0  (1.778 m)   Wt 221 lb 12.8 oz (100.6 kg)   SpO2 97%   BMI 31.82 kg/m  Wt Readings from Last 3 Encounters:  10/18/19 221 lb 12.8 oz (100.6 kg)  10/16/19  219 lb (99.3 kg)  10/04/19 219 lb 6 oz (99.5 kg)     Lab Results  Component Value Date   WBC 10.7 (H) 07/06/2019   HGB 13.5 07/06/2019   HCT 39.6 07/06/2019   PLT 192.0 07/06/2019   GLUCOSE 125 (H) 10/04/2019   CHOL 128 07/06/2019   TRIG 53.0 07/06/2019   HDL 49.40 07/06/2019   LDLCALC 68 07/06/2019   ALT 11 07/06/2019   AST 20 07/06/2019   NA 139 10/04/2019   K 4.3 10/04/2019   CL 104 10/04/2019   CREATININE 1.54 (H) 10/04/2019   BUN 29 (H) 10/04/2019   CO2 22 10/04/2019   TSH 4.34 07/06/2019   PSA 4.15 (H) 02/08/2014   INR 1.7 (H) 01/25/2019   HGBA1C 5.8 07/06/2019   MICROALBUR 34.6 (H) 08/09/2018       Assessment & Plan:   Problem List Items Addressed This Visit    Diabetes ([place])    Low carb diet and exercise.  Follow met b and a1c.       Dilatation of thoracic aorta (HCC)    S/p surgery as outlined.  Has f/u planned with cardiology tomorrow.        Hyperlipidemia LDL goal <70    On lipitor.  Low cholesterol diet and exercise.  Follow lipid panel and liver function tests.        Hypertension    Blood pressure on outside checks elevated.  He brought his wrist cuff.  Checks today in office - 160-109 systolic reading.  My check today in both arms - 138-140/78.  He did not take triam/hctz today.  On metoprolol and amlodipine 26m q day.  Just recently increased back up to 555m  Increased creatinine/decreased GFR on recent lab. He had not started the losartan when lab drawn.  Had been on lisinopril for years prior to his surgery - with stable renal function.  Will remain off triam/hctz given decreased GFR.  Pressure here today as outlined.  Has f/u with cardiology tomorrow.  Plans to have renal function rechecked.  Wanted to hold on repeat labs today.  Discussed that may consider starting losartan pending cardiology review, blood pressure review and lab review. Pt comfortable with this plan.        Pedal edema    Improved.  Not an issue today.       Renal  insufficiency    Recent elevation in creatinine (decreased GFR).  Was recently started on triam/hctz.  Swelling is better.  Blood pressure on my check today 138-140/78.  He did not take triam/hctz this am.  Will hold tomorrow.  Has f/u planned with cardiology tomorrow and has plans for f/u labs.  Review f/u met b (kidney function).  Discussed remaining off triam/hctz and adding back losartan.  Prior to his surgery, he had been on lisinopril for years with stable kidney function. Will hold on labs today at pts request, given lab scheduled with cardiology tomorrow.  Will need further adjustment in his medication - pending review of blood pressure readings and lab results.         S/P aortic valve replacement    Has been doing well s/p surgery.  Breathing stable. No chest pain.  Continue risk factor modification.  Has f/u with cardiology tomorrow.        S/P CABG x 3    S/p CABG.  Cardiac rehab.  Continue risk factor modification.  Has f/u planned with cardiology tomorrow.            Einar Pheasant, MD

## 2019-10-19 ENCOUNTER — Encounter: Payer: Self-pay | Admitting: Internal Medicine

## 2019-10-19 ENCOUNTER — Ambulatory Visit (INDEPENDENT_AMBULATORY_CARE_PROVIDER_SITE_OTHER): Payer: Medicare HMO | Admitting: Internal Medicine

## 2019-10-19 ENCOUNTER — Other Ambulatory Visit
Admission: RE | Admit: 2019-10-19 | Discharge: 2019-10-19 | Disposition: A | Discharge status: Home / Self Care | Payer: Medicare HMO | Source: Ambulatory Visit | Attending: Internal Medicine | Admitting: Internal Medicine

## 2019-10-19 VITALS — BP 158/82 | HR 75 | Ht 70.0 in | Wt 223.2 lb

## 2019-10-19 DIAGNOSIS — I2510 Atherosclerotic heart disease of native coronary artery without angina pectoris: Secondary | ICD-10-CM | POA: Diagnosis not present

## 2019-10-19 DIAGNOSIS — I350 Nonrheumatic aortic (valve) stenosis: Secondary | ICD-10-CM | POA: Diagnosis not present

## 2019-10-19 DIAGNOSIS — I5022 Chronic systolic (congestive) heart failure: Secondary | ICD-10-CM

## 2019-10-19 DIAGNOSIS — Z952 Presence of prosthetic heart valve: Secondary | ICD-10-CM | POA: Diagnosis not present

## 2019-10-19 DIAGNOSIS — N179 Acute kidney failure, unspecified: Secondary | ICD-10-CM

## 2019-10-19 DIAGNOSIS — I10 Essential (primary) hypertension: Secondary | ICD-10-CM

## 2019-10-19 DIAGNOSIS — I77810 Thoracic aortic ectasia: Secondary | ICD-10-CM | POA: Insufficient documentation

## 2019-10-19 LAB — BASIC METABOLIC PANEL
Anion gap: 9 (ref 5–15)
BUN: 31 mg/dL — ABNORMAL HIGH (ref 8–23)
CO2: 22 mmol/L (ref 22–32)
Calcium: 9.3 mg/dL (ref 8.9–10.3)
Chloride: 105 mmol/L (ref 98–111)
Creatinine, Ser: 1.35 mg/dL — ABNORMAL HIGH (ref 0.61–1.24)
GFR calc Af Amer: 54 mL/min — ABNORMAL LOW (ref >60)
GFR calc non Af Amer: 47 mL/min — ABNORMAL LOW (ref >60)
Glucose, Bld: 126 mg/dL — ABNORMAL HIGH (ref 70–99)
Potassium: 4.2 mmol/L (ref 3.5–5.1)
Sodium: 136 mmol/L (ref 135–145)

## 2019-10-19 LAB — MAGNESIUM: Magnesium: 2.1 mg/dL (ref 1.7–2.4)

## 2019-10-19 NOTE — Assessment & Plan Note (Signed)
Blood pressure on outside checks elevated.  He brought his wrist cuff.  Checks today in office - [id number] systolic reading.  My check today in both arms - 138-140/78.  He did not take triam/hctz today.  On metoprolol and amlodipine 5mg  q day.  Just recently increased back up to 5mg .  Increased creatinine/decreased GFR on recent lab. He had not started the losartan when lab drawn.  Had been on lisinopril for years prior to his surgery - with stable renal function.  Will remain off triam/hctz given decreased GFR.  Pressure here today as outlined.  Has f/u with cardiology tomorrow.  Plans to have renal function rechecked.  Wanted to hold on repeat labs today.  Discussed that may consider starting losartan pending cardiology review, blood pressure review and lab review. Pt comfortable with this plan.

## 2019-10-19 NOTE — Assessment & Plan Note (Signed)
Improved.  Not an issue today.

## 2019-10-19 NOTE — Assessment & Plan Note (Signed)
Recent elevation in creatinine (decreased GFR).  Was recently started on triam/hctz.  Swelling is better.  Blood pressure on my check today 138-140/78.  He did not take triam/hctz this am.  Will hold tomorrow.  Has f/u planned with cardiology tomorrow and has plans for f/u labs.  Review f/u met b (kidney function).  Discussed remaining off triam/hctz and adding back losartan.  Prior to his surgery, he had been on lisinopril for years with stable kidney function. Will hold on labs today at pts request, given lab scheduled with cardiology tomorrow.  Will need further adjustment in his medication - pending review of blood pressure readings and lab results.

## 2019-10-19 NOTE — Assessment & Plan Note (Signed)
Has been doing well s/p surgery.  Breathing stable. No chest pain.  Continue risk factor modification.  Has f/u with cardiology tomorrow.

## 2019-10-19 NOTE — Assessment & Plan Note (Signed)
On lipitor.  Low cholesterol diet and exercise.  Follow lipid panel and liver function tests.   

## 2019-10-19 NOTE — Assessment & Plan Note (Signed)
S/p surgery as outlined.  Has f/u planned with cardiology tomorrow.

## 2019-10-19 NOTE — Progress Notes (Signed)
Follow-up Outpatient Visit Date: 10/19/2019  Primary Care Provider: Einar Pheasant, MD [place] [id number] [place] [id number]  Chief Complaint: Follow-up CAD, valvular heart disease, and hypertension  HPI:  [name] is a 86 y.o. male with history of coronary artery disease and systolic heart failure secondary to ischemic cardiomyopathy complicated by severe aortic stenosis status post CABG(LIMA to LAD and sequential SVG to RPDA and RPL) and bioprosthetic aortic valve replacementon8/11/2018, hypertension, hyperlipidemia, diabetes mellitus, asthma, arthritis, gout, and BPH, who presents for follow-up of coronary artery disease, cardiomyopathy, and aortic stenosis.  I last saw [name] in January, at which time he was doing well other than occasional lightheadedness.  He also noted mild leg edema that began after amlodipine was started by his PCP.  Swelling was controlled with compression stocking use.  We agreed to decrease amlodipine to 2.5 mg daily and start triamterene/HCTZ 37.5/25 mg daily.  [name] was seen for f/u by Marrianne Mood, PA, 2 weeks ago.  His blood pressure was elevated at that time, prompting reinitiation of losartan.  However, due to the elevated creatinine from baseline, the patient was subsequently advised not to start taking losartan Ms. Visser.  Today, [name] reports he has been feeling well.  He notes that his blood pressures have continued to run high, though he does not trust the accuracy of his wrist blood pressure cuff at home.  He remains compliant with amlodipine and triamterene.  He denies chest pain, shortness of breath, palpitations, and lightheadedness.  Prior leg edema is well controlled.  --------------------------------------------------------------------------------------------------  Past Medical History:  Diagnosis Date  . Aortic stenosis   . Arthritis    right shoulder  . BPH (benign prostatic hypertrophy)   .  Coronary artery disease   . Gout   . Heart murmur   . History of chicken pox   . Hypercholesterolemia   . Hypertension   . Systolic heart failure (HCC)    ischemic cardiomyopathy  . Wears dentures    full upper and lower   Past Surgical History:  Procedure Laterality Date  . AORTIC VALVE REPLACEMENT N/A 01/25/2019   Procedure: AORTIC VALVE REPLACEMENT (AVR) 11mm Inspiris valve;  Surgeon: Gaye Pollack, MD;  Location: [facility];  Service: Open Heart Surgery;  Laterality: N/A;  . arm surgery     right arm fx s/p "plate insertion"  . CARDIAC CATHETERIZATION    . CATARACT EXTRACTION W/PHACO Right 11/17/2016   Procedure: CATARACT EXTRACTION PHACO AND INTRAOCULAR LENS PLACEMENT ([place])  Right;  Surgeon: Leandrew Koyanagi, MD;  Location: [facility];  Service: Ophthalmology;  Laterality: Right;  . CORONARY ARTERY BYPASS GRAFT N/A 01/25/2019   LIMA-LAD and sequential SVG-rPDA-rPL  . DENTAL SURGERY     all teeth extracted  . RIGHT HEART CATH AND CORONARY ANGIOGRAPHY N/A 01/02/2019   Procedure: RIGHT HEART CATH AND CORONARY ANGIOGRAPHY;  Surgeon: Nelva Bush, MD;  Location: [facility];  Service: Cardiovascular;  Laterality: N/A;  . TEE WITHOUT CARDIOVERSION N/A 01/25/2019   Procedure: TRANSESOPHAGEAL ECHOCARDIOGRAM (TEE);  Surgeon: Gaye Pollack, MD;  Location: [facility];  Service: Open Heart Surgery;  Laterality: N/A;    Current Meds  Medication Sig  . allopurinol (ZYLOPRIM) 100 MG tablet TAKE 1 TABLET EVERY DAY (Patient taking differently: Take 100 mg by mouth daily. )  . amLODipine (NORVASC) 2.5 MG tablet Take 2 tablets (5 mg total) by mouth daily.  Marland Kitchen aspirin EC 81 MG tablet Take 1 tablet (81 mg  total) by mouth daily.  Marland Kitchen atorvastatin (LIPITOR) 80 MG tablet Take 1 tablet (80 mg total) by mouth every morning.  . folic acid (FOLVITE) 1 MG tablet Take 1 tablet (1 mg total) by mouth daily.  Marland Kitchen LORazepam (ATIVAN) 1 MG tablet Takes 1/2 to 1 tablet q day prn (Patient taking  differently: Take 0.5-1 mg by mouth at bedtime as needed for sleep. )  . metoprolol succinate (TOPROL-XL) 25 MG 24 hr tablet Take 1 tablet (25 mg total) by mouth 2 (two) times daily. Take with or immediately following a meal.  . triamterene-hydrochlorothiazide (MAXZIDE-25) 37.5-25 MG tablet TAKE 1 TABLET BY MOUTH EVERY DAY    Allergies: Patient has no known allergies.  Social History   Tobacco Use  . Smoking status: Never Smoker  . Smokeless tobacco: Current User    Types: Chew  . Tobacco comment: not ready to quit currently  Substance Use Topics  . Alcohol use: No    Alcohol/week: 0.0 standard drinks  . Drug use: No    Family History  Problem Relation Age of Onset  . Leukemia Father   . Congestive Heart Failure Mother   . Cancer Daughter        Breast Cancer  . Prostate cancer Neg Hx   . Colon cancer Neg Hx     Review of Systems: A 12-system review of systems was performed and was negative except as noted in the HPI.  --------------------------------------------------------------------------------------------------  Physical Exam: BP (!) 158/82 (BP Location: Left Arm, Patient Position: Sitting, Cuff Size: Normal)   Pulse 75   Ht 5\' 10"  (1.778 m)   Wt 223 lb 4 oz (101.3 kg)   SpO2 98%   BMI 32.03 kg/m   General: NAD. Neck: No JVD or HJR. Lungs: Clear to auscultation bilaterally without wheezes or crackles. Heart: Regular rate and rhythm with 1/6 systolic murmur.  No rubs or gallops. Abdomen: Soft, nontender, nondistended. Extremities: Trace ankle edema bilaterally.  EKG: Normal sinus rhythm with PACs, LAFB, and LVH with abnormal repolarization.  PACs are new since 07/11/2019.  Otherwise, no significant interval change.  Lab Results  Component Value Date   WBC 10.7 (H) 07/06/2019   HGB 13.5 07/06/2019   HCT 39.6 07/06/2019   MCV 93.8 07/06/2019   PLT 192.0 07/06/2019    Lab Results  Component Value Date   NA 136 10/19/2019   K 4.2 10/19/2019   CL 105  10/19/2019   CO2 22 10/19/2019   BUN 31 (H) 10/19/2019   CREATININE 1.35 (H) 10/19/2019   GLUCOSE 126 (H) 10/19/2019   ALT 11 07/06/2019    Lab Results  Component Value Date   CHOL 128 07/06/2019   HDL 49.40 07/06/2019   LDLCALC 68 07/06/2019   TRIG 53.0 07/06/2019   CHOLHDL 3 07/06/2019    --------------------------------------------------------------------------------------------------  ASSESSMENT AND PLAN: Hypertension and acute kidney injury: Blood pressure suboptimally controlled.  Multiple medication adjustments were recommended at recent visit with Marrianne Mood, PA, but ultimately not pursued due to bump in creatinine.  Will recheck creatinine today, and if it has improved, plan to add losartan 25 to 50 mg daily.  Now, we will continue amlodipine 5 mg daily and triamterene HCTZ.  He is scheduled for follow-up with Dr. Nicki Reaper in about 3 weeks, at which time BMP may need to be rechecked.  Coronary artery disease: Mr. Mazzocchi does not have any signs or symptoms to suggest worsening coronary insufficiency status post CABG.  Continue current medications for secondary prevention.  Aortic stenosis status post bioprosthetic aortic valve: Mr. Vandegriff is doing well without signs or symptoms of heart failure or valve dysfunction.  Continue medical therapy and clinical follow-up.  Chronic HFrEF: Other than chronic trace edema, Mr. Conliffe appears euvolemic.  We will hopefully be able to add back losartan for blood pressure control and to maintain evidence-based heart failure therapy, with post CABG/AVR echo showing EF of 40 to 45%.  Hyperlipidemia: LDL at goal.  Continue atorvastatin 80 mg daily.  Follow-up: Return to clinic in 3 months.  Nelva Bush, MD 10/20/2019 2:10 PM

## 2019-10-19 NOTE — Patient Instructions (Signed)
Medication Instructions:  Your physician recommends that you continue on your current medications as directed. Please refer to the Current Medication list given to you today.  *If you need a refill on your cardiac medications before your next appointment, please call your pharmacy*   Lab Work: TODAY: BMET and Magnesium  If you have labs (blood work) drawn today and your tests are completely normal, you will receive your results only by: Marland Kitchen MyChart Message (if you have MyChart) OR . A paper copy in the mail If you have any lab test that is abnormal or we need to change your treatment, we will call you to review the results.   Follow-Up: At [facility], you and your health needs are our priority.  As part of our continuing mission to provide you with exceptional heart care, we have created designated Provider Care Teams.  These Care Teams include your primary Cardiologist (physician) and Advanced Practice Providers (APPs -  Physician Assistants and Nurse Practitioners) who all work together to provide you with the care you need, when you need it.  We recommend signing up for the patient portal called "MyChart".  Sign up information is provided on this After Visit Summary.  MyChart is used to connect with patients for Virtual Visits (Telemedicine).  Patients are able to view lab/test results, encounter notes, upcoming appointments, etc.  Non-urgent messages can be sent to your provider as well.   To learn more about what you can do with MyChart, go to [email or web address].    Your next appointment:   3 month(s)  The format for your next appointment:   In Person  Provider:    You may see Nelva Bush, MD or one of the following Advanced Practice Providers on your designated Care Team:    Murray Hodgkins, NP  Christell Faith, PA-C  Marrianne Mood, PA-C

## 2019-10-19 NOTE — Assessment & Plan Note (Signed)
S/p CABG.  Cardiac rehab.  Continue risk factor modification.  Has f/u planned with cardiology tomorrow.

## 2019-10-19 NOTE — Assessment & Plan Note (Signed)
Low carb diet and exercise.  Follow met b and a1c.  

## 2019-10-20 DIAGNOSIS — N179 Acute kidney failure, unspecified: Secondary | ICD-10-CM | POA: Insufficient documentation

## 2019-10-23 ENCOUNTER — Telehealth: Payer: Self-pay | Admitting: *Deleted

## 2019-10-23 MED ORDER — LOSARTAN POTASSIUM 50 MG PO TABS
25.0000 mg | ORAL_TABLET | Freq: Every day | ORAL | 3 refills | Status: DC
Start: 2019-10-23 — End: 2019-11-15

## 2019-10-23 NOTE — Telephone Encounter (Signed)
Results called to pt. Pt verbalized understanding of results and recommendations. Med list updated.  

## 2019-10-23 NOTE — Telephone Encounter (Signed)
-----   Message from Nelva Bush, MD sent at 10/20/2019  2:18 PM EDT ----- Please let [name] [name] know that his kidney function has improved but is still a little worse than his baseline.  I suggest that he increase his water intake and begin taking losartan 25 mg daily.  He can split the 50 mg tablets that he was recently prescribed in half.  He should have a basic metabolic panel when he follows up with Dr. Nicki Reaper in about 3 weeks.  I will include her on this message.  He should continue current doses of amlodipine and triamterene-HCTZ.

## 2019-10-30 ENCOUNTER — Other Ambulatory Visit: Payer: Self-pay | Admitting: Internal Medicine

## 2019-11-01 NOTE — Addendum Note (Signed)
Addended by: Britt Bottom on: 11/01/2019 02:11 PM   Modules accepted: Orders

## 2019-11-09 ENCOUNTER — Encounter: Payer: Self-pay | Admitting: Internal Medicine

## 2019-11-09 ENCOUNTER — Other Ambulatory Visit: Payer: Self-pay

## 2019-11-09 ENCOUNTER — Ambulatory Visit (INDEPENDENT_AMBULATORY_CARE_PROVIDER_SITE_OTHER): Payer: Medicare HMO | Admitting: Internal Medicine

## 2019-11-09 VITALS — BP 134/78 | HR 61 | Temp 97.4°F | Resp 16 | Ht 70.0 in | Wt 224.0 lb

## 2019-11-09 DIAGNOSIS — Z951 Presence of aortocoronary bypass graft: Secondary | ICD-10-CM

## 2019-11-09 DIAGNOSIS — E1159 Type 2 diabetes mellitus with other circulatory complications: Secondary | ICD-10-CM | POA: Diagnosis not present

## 2019-11-09 DIAGNOSIS — N289 Disorder of kidney and ureter, unspecified: Secondary | ICD-10-CM | POA: Diagnosis not present

## 2019-11-09 DIAGNOSIS — E785 Hyperlipidemia, unspecified: Secondary | ICD-10-CM

## 2019-11-09 DIAGNOSIS — Z952 Presence of prosthetic heart valve: Secondary | ICD-10-CM

## 2019-11-09 DIAGNOSIS — I10 Essential (primary) hypertension: Secondary | ICD-10-CM

## 2019-11-09 LAB — BASIC METABOLIC PANEL
BUN: 29 mg/dL — ABNORMAL HIGH (ref 6–23)
CO2: 26 mEq/L (ref 19–32)
Calcium: 9.9 mg/dL (ref 8.4–10.5)
Chloride: 103 mEq/L (ref 96–112)
Creatinine, Ser: 1.45 mg/dL (ref 0.40–1.50)
GFR: 46.01 mL/min — ABNORMAL LOW (ref >60.00)
Glucose, Bld: 114 mg/dL — ABNORMAL HIGH (ref 70–99)
Potassium: 4.2 mEq/L (ref 3.5–5.1)
Sodium: 134 mEq/L — ABNORMAL LOW (ref 135–145)

## 2019-11-09 MED ORDER — LORAZEPAM 1 MG PO TABS: ORAL_TABLET | ORAL | 2 refills | Status: DC

## 2019-11-09 NOTE — Progress Notes (Signed)
Patient ID: MARIANO DOSHI, male   DOB: 03/26/1931, 86 y.o.   MRN: [id number]   Subjective:    Patient ID: LAURENCE CROFFORD, male    DOB: [birth year], 86 y.o.   MRN: [id number]  HPI This visit occurred during the SARS-CoV-2 public health emergency.  Safety protocols were in place, including screening questions prior to the visit, additional usage of staff PPE, and extensive cleaning of exam room while observing appropriate contact time as indicated for disinfecting solutions.  Patient here for a scheduled follow up. He is doing relatively well.  After our last visit, he saw cardiology.  Is taking triam/hctz one whole pill per day, metoprolol '25mg'$  bid, losartan '25mg'$  q pm and amlodipine '5mg'$  in am.  Blood pressure is doing better.  Got a blood pressure cuff.  Brought in and nurse instructed on how to use.  Reports not chest pain or increased sob.  Working outside.  Energy still not back to his normal prior to surgery, but has improved.  No acid reflux or abdominal pain.  Bowels moving. Request refill of lorazepam. Does not take regularly.  Discussed possible side effects.  Discussed medication and labs.  Discussed his decreased renal function.  Discussed low carb diet and exercise.  Following sugars.     Past Medical History:  Diagnosis Date  . Aortic stenosis   . Arthritis    right shoulder  . BPH (benign prostatic hypertrophy)   . Coronary artery disease   . Gout   . Heart murmur   . History of chicken pox   . Hypercholesterolemia   . Hypertension   . Systolic heart failure (HCC)    ischemic cardiomyopathy  . Wears dentures    full upper and lower   Past Surgical History:  Procedure Laterality Date  . AORTIC VALVE REPLACEMENT N/A 01/25/2019   Procedure: AORTIC VALVE REPLACEMENT (AVR) 33m Inspiris valve;  Surgeon: BGaye Pollack MD;  [facility];  Service: Open Heart Surgery;  Laterality: N/A;  . arm surgery     right arm fx s/p "plate insertion"  . CARDIAC CATHETERIZATION    .  CATARACT EXTRACTION W/PHACO Right 11/17/2016   Procedure: CATARACT EXTRACTION [place]AC[place] AND INTRAOCULAR [place]ENS PLACEMENT [place]  [place];  Surgeon: BLeandrew Koyanagi MD;  [place] [facility]  Service: Ophthalmology;  Laterality: Right;  . CORONARY ARTERY BYPASS GRAFT N/A 01/25/2019   LIMA-LAD and sequential SVG-rPDA-rPL  . DENTAL SURGERY     all teeth extracted  . RIGHT HEART CATH AND CORONARY ANGIOGRAPHY N/A 01/02/2019   Procedure: RIGHT HEART CATH AND CORONARY ANGIOGRAPHY;  Surgeon: ENelva Bush MD;  [facility];  Service: Cardiovascular;  Laterality: N/A;  . TEE WITHOUT CARDIOVERSION N/A 01/25/2019   Procedure: TRANSESOPHAGEAL ECHOCARDIOGRAM (TEE);  Surgeon: BGaye Pollack MD;  [place] [facility]  Service: Open Heart Surgery;  Laterality: N/A;   Family History  Problem Relation Age of Onset  . Leukemia Father   . Congestive Heart Failure Mother   . Cancer Daughter        Breast Cancer  . Prostate cancer Neg Hx   . Colon cancer Neg Hx    Social History   Socioeconomic History  . Marital status: Married    Spouse name: Not on file  . Number of children: Not on file  . Years of education: Not on file  . Highest education level: Not on file  Occupational History  . Not on file  Tobacco Use  . Smoking  status: Never Smoker  . Smokeless tobacco: Current User    Types: Chew  . Tobacco comment: not ready to quit currently  Substance and Sexual Activity  . Alcohol use: No    Alcohol/week: 0.0 standard drinks  . Drug use: No  . Sexual activity: Not Currently  Other Topics Concern  . Not on file  Social History Narrative  . Not on file   Social Determinants of Health   Financial Resource Strain:   . Difficulty of Paying Living Expenses:   Food Insecurity:   . Worried About Charity fundraiser in the Last Year:   . Arboriculturist in the Last Year:   Transportation Needs:   . Film/video editor (Medical):   Marland Kitchen Lack of Transportation  (Non-Medical):   Physical Activity:   . Days of Exercise per Week:   . Minutes of Exercise per Session:   Stress:   . Feeling of Stress :   Social Connections:   . Frequency of Communication with Friends and Family:   . Frequency of Social Gatherings with Friends and Family:   . Attends Religious Services:   . Active Member of Clubs or Organizations:   . Attends Archivist Meetings:   Marland Kitchen Marital Status:     Outpatient Encounter Medications as of 11/09/2019  Medication Sig  . allopurinol (ZYLOPRIM) 100 MG tablet TAKE 1 TABLET EVERY DAY (Patient taking differently: Take 100 mg by mouth daily. )  . amLODipine (NORVASC) 2.5 MG tablet Take 2 tablets (5 mg total) by mouth daily.  Marland Kitchen aspirin EC 81 MG tablet Take 1 tablet (81 mg total) by mouth daily.  Marland Kitchen atorvastatin (LIPITOR) 80 MG tablet Take 1 tablet (80 mg total) by mouth every morning.  . folic acid (FOLVITE) 1 MG tablet Take 1 tablet (1 mg total) by mouth daily.  Marland Kitchen LORazepam (ATIVAN) 1 MG tablet Takes 1/2 to 1 tablet q day prn  . losartan (COZAAR) 50 MG tablet Take 0.5 tablets (25 mg total) by mouth daily.  . metoprolol succinate (TOPROL-XL) 25 MG 24 hr tablet Take 1 tablet (25 mg total) by mouth 2 (two) times daily. Take with or immediately following a meal.  . triamterene-hydrochlorothiazide (MAXZIDE-25) 37.5-25 MG tablet TAKE 1 TABLET BY MOUTH EVERY DAY  . [DISCONTINUED] LORazepam (ATIVAN) 1 MG tablet Takes 1/2 to 1 tablet q day prn (Patient taking differently: Take 0.5-1 mg by mouth at bedtime as needed for sleep. )   No facility-administered encounter medications on file as of 11/09/2019.   Review of Systems  Constitutional: Negative for appetite change.       Some fatigue, but overall feels he is doing well.   HENT: Negative for congestion and sinus pressure.   Respiratory: Negative for cough, chest tightness and shortness of breath.   Cardiovascular: Negative for chest pain, palpitations and leg swelling.   Gastrointestinal: Negative for abdominal pain, diarrhea, nausea and vomiting.  Genitourinary: Negative for difficulty urinating and dysuria.  Musculoskeletal: Negative for joint swelling and myalgias.  Skin: Negative for color change and rash.  Neurological: Negative for dizziness, light-headedness and headaches.  Psychiatric/Behavioral: Negative for agitation and dysphoric mood.       Objective:    Physical Exam Vitals reviewed.  Constitutional:      General: He is not in acute distress.    Appearance: Normal appearance. He is well-developed.  HENT:     Head: Normocephalic and atraumatic.     Right Ear: External ear normal.  Left Ear: External ear normal.  Eyes:     General: No scleral icterus.       Right eye: No discharge.        Left eye: No discharge.     Conjunctiva/sclera: Conjunctivae normal.  Cardiovascular:     Rate and Rhythm: Normal rate and regular rhythm.  Pulmonary:     Effort: Pulmonary effort is normal. No respiratory distress.     Breath sounds: Normal breath sounds.  Abdominal:     General: Bowel sounds are normal.     Palpations: Abdomen is soft.     Tenderness: There is no abdominal tenderness.  Musculoskeletal:        General: No swelling or tenderness.     Cervical back: Neck supple. No tenderness.  Lymphadenopathy:     Cervical: No cervical adenopathy.  Skin:    Findings: No erythema or rash.  Neurological:     Mental Status: He is alert.  Psychiatric:        Mood and Affect: Mood normal.        Behavior: Behavior normal.     BP 134/78   Pulse 61   Temp (!) 97.4 F (36.3 C)   Resp 16   Ht '5\' 10"'$  (1.778 m)   Wt 224 lb (101.6 kg)   SpO2 99%   BMI 32.14 kg/m  Wt Readings from Last 3 Encounters:  11/09/19 224 lb (101.6 kg)  10/19/19 223 lb 4 oz (101.3 kg)  10/18/19 221 lb 12.8 oz (100.6 kg)     Lab Results  Component Value Date   WBC 10.7 (H) 07/06/2019   HGB 13.5 07/06/2019   HCT 39.6 07/06/2019   PLT 192.0 07/06/2019    GLUCOSE 114 (H) 11/09/2019   CHOL 128 07/06/2019   TRIG 53.0 07/06/2019   HDL 49.40 07/06/2019   LDLCALC 68 07/06/2019   ALT 11 07/06/2019   AST 20 07/06/2019   NA 134 (L) 11/09/2019   K 4.2 11/09/2019   CL 103 11/09/2019   CREATININE 1.45 11/09/2019   BUN 29 (H) 11/09/2019   CO2 26 11/09/2019   TSH 4.34 07/06/2019   PSA 4.15 (H) 02/08/2014   INR 1.7 (H) 01/25/2019   HGBA1C 5.8 07/06/2019   MICROALBUR 34.6 (H) 08/09/2018       Assessment & Plan:   Problem List Items Addressed This Visit    Diabetes ([place])    Low carb diet and exercise.  Follow met b and a1c.       Hyperlipidemia LDL goal <70    On lipitor.  Low cholesterol diet and exercise.  Follow lipid panel and liver function tests.        Hypertension - Primary    Blood pressure on recheck improved 130s/78-80.  Will check metabolic panel today.  If decreased GFR, consider decrease triam/hctz to 1/2 tablet per day and increase losartan to '50mg'$  q day.  Follow pressures.  He brought in his new cuff.  Instructed on proper way to use.        Relevant Orders   Basic metabolic panel (Completed)   Renal insufficiency    Decreased GFR noted recently.  Felt to be related to the triam/hctz. See last note.  Taking one tablet daily now.  Blood pressure is better.  Stay hydrated.  Recheck metabolic panel today.  If decreased, consider decrease triam/hctz to 1/2 tablet per day and increase losartan to '50mg'$  q day.  Avoid antiinflammatories.  S/P aortic valve replacement    Followed by cardiology.  Breathing better.        S/P CABG x 3    S/p CABG.  Cardiac rehab.  Continue risk factor modification.  Followed by cardiology.  Blood pressure is better.            Einar Pheasant, MD

## 2019-11-11 ENCOUNTER — Encounter: Payer: Self-pay | Admitting: Internal Medicine

## 2019-11-11 NOTE — Assessment & Plan Note (Signed)
Decreased GFR noted recently.  Felt to be related to the triam/hctz. See last note.  Taking one tablet daily now.  Blood pressure is better.  Stay hydrated.  Recheck metabolic panel today.  If decreased, consider decrease triam/hctz to 1/2 tablet per day and increase losartan to 50mg  q day.  Avoid antiinflammatories.

## 2019-11-11 NOTE — Assessment & Plan Note (Signed)
S/p CABG.  Cardiac rehab.  Continue risk factor modification.  Followed by cardiology.  Blood pressure is better.

## 2019-11-11 NOTE — Assessment & Plan Note (Signed)
On lipitor.  Low cholesterol diet and exercise.  Follow lipid panel and liver function tests.   

## 2019-11-11 NOTE — Assessment & Plan Note (Signed)
Low carb diet and exercise.  Follow met b and a1c.  

## 2019-11-11 NOTE — Assessment & Plan Note (Signed)
Followed by cardiology.  Breathing better.

## 2019-11-11 NOTE — Assessment & Plan Note (Signed)
Blood pressure on recheck improved 130s/78-80.  Will check metabolic panel today.  If decreased GFR, consider decrease triam/hctz to 1/2 tablet per day and increase losartan to 50mg  q day.  Follow pressures.  He brought in his new cuff.  Instructed on proper way to use.

## 2019-11-12 ENCOUNTER — Other Ambulatory Visit: Payer: Self-pay | Admitting: Internal Medicine

## 2019-11-15 ENCOUNTER — Other Ambulatory Visit: Payer: Self-pay

## 2019-11-15 ENCOUNTER — Other Ambulatory Visit: Payer: Self-pay | Admitting: Internal Medicine

## 2019-11-15 MED ORDER — ALLOPURINOL 100 MG PO TABS: 100.0000 mg | ORAL_TABLET | Freq: Every day | ORAL | 3 refills | Status: DC

## 2019-11-15 MED ORDER — LOSARTAN POTASSIUM 50 MG PO TABS
50.0000 mg | ORAL_TABLET | Freq: Every day | ORAL | 3 refills | Status: DC
Start: 2019-11-15 — End: 2020-01-23

## 2019-11-16 ENCOUNTER — Other Ambulatory Visit: Payer: Self-pay | Admitting: Internal Medicine

## 2019-11-16 DIAGNOSIS — E1159 Type 2 diabetes mellitus with other circulatory complications: Secondary | ICD-10-CM

## 2019-11-16 DIAGNOSIS — E785 Hyperlipidemia, unspecified: Secondary | ICD-10-CM

## 2019-11-16 DIAGNOSIS — I10 Essential (primary) hypertension: Secondary | ICD-10-CM

## 2019-11-16 NOTE — Progress Notes (Signed)
Orders placed for f/u labs.  

## 2019-12-05 ENCOUNTER — Other Ambulatory Visit (INDEPENDENT_AMBULATORY_CARE_PROVIDER_SITE_OTHER): Payer: Medicare HMO

## 2019-12-05 ENCOUNTER — Other Ambulatory Visit: Payer: Self-pay

## 2019-12-05 DIAGNOSIS — E1159 Type 2 diabetes mellitus with other circulatory complications: Secondary | ICD-10-CM

## 2019-12-05 DIAGNOSIS — E785 Hyperlipidemia, unspecified: Secondary | ICD-10-CM | POA: Diagnosis not present

## 2019-12-05 DIAGNOSIS — I10 Essential (primary) hypertension: Secondary | ICD-10-CM

## 2019-12-05 LAB — HEMOGLOBIN A1C: Hgb A1c MFr Bld: 6.5 % (ref 4.6–6.5)

## 2019-12-05 LAB — LIPID PANEL
Cholesterol: 109 mg/dL (ref 0–200)
HDL: 39.3 mg/dL (ref >39.00)
LDL Cholesterol: 60 mg/dL (ref 0–99)
NonHDL: 69.64
Total CHOL/HDL Ratio: 3
Triglycerides: 47 mg/dL (ref 0.0–149.0)
VLDL: 9.4 mg/dL (ref 0.0–40.0)

## 2019-12-05 LAB — HEPATIC FUNCTION PANEL
ALT: 14 U/L (ref 0–53)
AST: 22 U/L (ref 0–37)
Albumin: 4.5 g/dL (ref 3.5–5.2)
Alkaline Phosphatase: 70 U/L (ref 39–117)
Bilirubin, Direct: 0.1 mg/dL (ref 0.0–0.3)
Total Bilirubin: 0.7 mg/dL (ref 0.2–1.2)
Total Protein: 7 g/dL (ref 6.0–8.3)

## 2019-12-05 LAB — MICROALBUMIN / CREATININE URINE RATIO
Creatinine,U: 87.3 mg/dL
Microalb Creat Ratio: 52.6 mg/g — ABNORMAL HIGH (ref 0.0–30.0)
Microalb, Ur: 46 mg/dL — ABNORMAL HIGH (ref 0.0–1.9)

## 2019-12-05 LAB — BASIC METABOLIC PANEL
BUN: 26 mg/dL — ABNORMAL HIGH (ref 6–23)
CO2: 28 mEq/L (ref 19–32)
Calcium: 9.8 mg/dL (ref 8.4–10.5)
Chloride: 102 mEq/L (ref 96–112)
Creatinine, Ser: 1.36 mg/dL (ref 0.40–1.50)
GFR: 49.53 mL/min — ABNORMAL LOW (ref >60.00)
Glucose, Bld: 113 mg/dL — ABNORMAL HIGH (ref 70–99)
Potassium: 4.5 mEq/L (ref 3.5–5.1)
Sodium: 138 mEq/L (ref 135–145)

## 2020-01-21 NOTE — Progress Notes (Signed)
Cardiology Office Note    Date:  01/23/2020   ID:  [name] [name], DOB [birth year], MRN [id number]  PCP:  Einar Pheasant, MD  Cardiologist:  Nelva Bush, MD  Electrophysiologist:  None   Chief Complaint: Follow up  History of Present Illness:   [name] [name] is a 86 y.o. male with history of multivessel CAD s/p 3-vessel CABG with LIMA-LAD, sequential SVG-rPDA and RPL in 01/2019, HFrEF secondary to ICM complicated by severe aortic stenosis s/p bioprosthetic AVR in 01/2019 at the time of his cardiac bypass, CKD stage III, HTN, HLD, asthma, arthritis, gout, and BPH who presents for follow up of his CAD, ICM, and aortic valve disease.  He was previous evaluated by [facility] cardiology in 2015 for murmur and underwent nuclear stress test which was unrevealing per his report. Following that, he was lost to follow up. He initially saw his PCP in 11/2018, for a scheduled physical but reported he did not feel well. It was noted he was hypoxic with ambulation back to the exam room with documented O2 saturation of 86 to 89% on room air with some tightness in his throat and upper chest. In this setting, he was sent to the hospital. CTA chest was negative for PE with prominence of the ascending aorta measuring 4.1 x 4.1 cm with aortic atherosclerosis as well as coronary artery calcification and evidence of pulmonary hypertension.Echo on 6/23 showed severe hypokinesis of the entire anterior wall, anterolateral wall, and apical segment with dyskinesis of the basal inferior wall, EF 30 to 35%, Doppler parameters consistent with restrictive filling with elevated mean left atrial pressure, unable to exclude LV apical thrombus, large pleural effusion in the left lateral region, degenerative mitral valve with moderate mitral regurgitation, indeterminate number of aortic valve cusps with severe aortic stenosis with a mean gradient of 42 mmHg and a valve area of 0.58 cm, moderate aortic valve insufficiency, and  low normal RV systolic function, RV systolic pressure at least 65 to 70 mmHg.  He preferred to undergo workup as an outpatient as he wanted to be home to help with his wife.  He subsequently underwent R/LHC on 01/02/2019 that showed significant two-vessel CAD, including 80% ostial LAD stenosis and 80-90% stenosis of large RCA continuation, mild to moderate disease noted in the proximal/mid LAD, RCA, and ramus, mildly elevated right heart filling pressures, moderately elevated left heart filling pressures, severe pulmonary hypertension, moderately reduced CO/CI. He was referred to CVTS for consultation of CABG given two-vessel CAD, with the LAD not being ideal for PCI, and in the setting of severe aortic stenosis.  Initially, he cancelled his appointment with cardiothoracic surgery.  He was ultimately evaluated by them and underwent 3-vessel CABG and bioprosthetic AVR in 01/2019.  Postoperative echo in 07/2019 showed a mildly improved LV SF with an EF of 40 to 17%, grade 1 diastolic dysfunction, severe hypokinesis noted in the mid to apical anterolateral wall, low normal RV systolic function with normal RV cavity size, mildly dilated left atrium, trivial mitral regurgitation, and a normal functioning bioprosthetic aortic valve without rocking or dehiscence noted with a mean gradient of 7 mmHg.  More recently, his amlodipine was tapered due to lower extremity swelling and losartan was held secondary to elevated SCr.  He was last seen in the office in 09/2019 and was feeling well.  He did note his BP continued to run on the high side, though he did not trust the accuracy of his wrist cuff.  He remained on  amlodipine and triamterene/HCTZ.  BP at that visit was 158/82.  Recheck renal function was improved some, and in this setting, he was started on losartan 25 mg daily.  He was seen by his PCP in follow up in 10/2019 with a BP of 134/78.  Follow up labs at that time showed slightly worse renal function and in this setting,  HCTZ was decreased to 12.5 mg daily and losartan was increased to 50 mg daily.  Follow up BMET in 11/2019 showed stable renal function and his current medications were continued.   He is doing well from a cardiac perspective.  Since he was last seen he denies any chest pain, dyspnea, palpitations, dizziness, presyncope, syncope.  He does note some mild intermittent right lower extremity swelling which dates back to his CABG.  Typically this swelling is improved in the morning and worse as the day progresses.  He does elevate his leg at times and has compression hose but does not like to wear this in the summer months.  He has picked up a new BP cuff though is not checking his BP at home yet.  No falls, hematochezia, or melena.  He is tolerating all medications without issues.  He does not have any issues or concerns at this time.   Labs independently reviewed: 11/2019 - albumin 4.5, AST/ALT normal, A1c 6.5, TC 109, TG 47, HDL 39, LDL 60, potassium 4.5, BUN 26, serum creatinine 1.36 09/2019 - magnesium 2.1 06/2019- TSH normal, Hgb 13.5, PLT 192  Past Medical History:  Diagnosis Date  . Aortic stenosis   . Arthritis    right shoulder  . BPH (benign prostatic hypertrophy)   . Coronary artery disease   . Gout   . Heart murmur   . History of chicken pox   . Hypercholesterolemia   . Hypertension   . Systolic heart failure (HCC)    ischemic cardiomyopathy  . Wears dentures    full upper and lower    Past Surgical History:  Procedure Laterality Date  . AORTIC VALVE REPLACEMENT N/A 01/25/2019   Procedure: AORTIC VALVE REPLACEMENT (AVR) 44mm Inspiris valve;  Surgeon: Gaye Pollack, MD;  Location: [place] [facility];  Service: Open Heart Surgery;  Laterality: N/A;  . arm surgery     right arm fx s/p "plate insertion"  . CARDIAC CATHETERIZATION    . CATARACT EXTRACTION W/PHACO Right 11/17/2016   Procedure: CATARACT EXTRACTION PHACO AND INTRAOCULAR LENS PLACEMENT ([place])  Right;  Surgeon: Leandrew Koyanagi,  MD;  Location: [facility];  Service: Ophthalmology;  Laterality: Right;  . CORONARY ARTERY BYPASS GRAFT N/A 01/25/2019   LIMA-LAD and sequential SVG-rPDA-rPL  . DENTAL SURGERY     all teeth extracted  . RIGHT HEART CATH AND CORONARY ANGIOGRAPHY N/A 01/02/2019   Procedure: RIGHT HEART CATH AND CORONARY ANGIOGRAPHY;  Surgeon: Nelva Bush, MD;  Location: [facility];  Service: Cardiovascular;  Laterality: N/A;  . TEE WITHOUT CARDIOVERSION N/A 01/25/2019   Procedure: TRANSESOPHAGEAL ECHOCARDIOGRAM (TEE);  Surgeon: Gaye Pollack, MD;  Location: [facility];  Service: Open Heart Surgery;  Laterality: N/A;    Current Medications: Current Meds  Medication Sig  . allopurinol (ZYLOPRIM) 100 MG tablet Take 1 tablet (100 mg total) by mouth daily.  Marland Kitchen amLODipine (NORVASC) 2.5 MG tablet Take 2 tablets (5 mg total) by mouth daily.  Marland Kitchen aspirin EC 81 MG tablet Take 1 tablet (81 mg total) by mouth daily.  Marland Kitchen atorvastatin (LIPITOR) 80 MG tablet TAKE 1 TABLET (80  MG TOTAL) BY MOUTH EVERY MORNING.  . LORazepam (ATIVAN) 1 MG tablet Takes 1/2 to 1 tablet q day prn  . losartan (COZAAR) 50 MG tablet Take 1 tablet (50 mg total) by mouth daily.  . metoprolol succinate (TOPROL-XL) 25 MG 24 hr tablet Take 1 tablet (25 mg total) by mouth 2 (two) times daily. Take with or immediately following a meal.  . triamterene-hydrochlorothiazide (MAXZIDE-25) 37.5-25 MG tablet Take 0.5 tablets by mouth daily.    Allergies:   Patient has no known allergies.   Social History   Socioeconomic History  . Marital status: Married    Spouse name: Not on file  . Number of children: Not on file  . Years of education: Not on file  . Highest education level: Not on file  Occupational History  . Not on file  Tobacco Use  . Smoking status: Never Smoker  . Smokeless tobacco: Current User    Types: Chew  . Tobacco comment: not ready to quit currently  Vaping Use  . Vaping Use: Never used  Substance and Sexual Activity    . Alcohol use: No    Alcohol/week: 0.0 standard drinks  . Drug use: No  . Sexual activity: Not Currently  Other Topics Concern  . Not on file  Social History Narrative  . Not on file   Social Determinants of Health   Financial Resource Strain:   . Difficulty of Paying Living Expenses:   Food Insecurity:   . Worried About Charity fundraiser in the Last Year:   . Arboriculturist in the Last Year:   Transportation Needs:   . Film/video editor (Medical):   Marland Kitchen Lack of Transportation (Non-Medical):   Physical Activity:   . Days of Exercise per Week:   . Minutes of Exercise per Session:   Stress:   . Feeling of Stress :   Social Connections:   . Frequency of Communication with Friends and Family:   . Frequency of Social Gatherings with Friends and Family:   . Attends Religious Services:   . Active Member of Clubs or Organizations:   . Attends Archivist Meetings:   Marland Kitchen Marital Status:      Family History:  The patient's family history includes Cancer in his daughter; Congestive Heart Failure in his mother; Leukemia in his father. There is no history of Prostate cancer or Colon cancer.  ROS:   Review of Systems  Constitutional: Negative for chills, diaphoresis, fever, malaise/fatigue and weight loss.  HENT: Negative for congestion.   Eyes: Negative for discharge and redness.  Respiratory: Negative for cough, sputum production, shortness of breath and wheezing.   Cardiovascular: Positive for leg swelling. Negative for chest pain, palpitations, orthopnea, claudication and PND.  Gastrointestinal: Negative for abdominal pain, blood in stool, heartburn, melena, nausea and vomiting.  Musculoskeletal: Negative for falls and myalgias.  Skin: Negative for rash.  Neurological: Negative for dizziness, tingling, tremors, sensory change, speech change, focal weakness, loss of consciousness and weakness.  Endo/Heme/Allergies: Does not bruise/bleed easily.   Psychiatric/Behavioral: Negative for substance abuse. The patient is not nervous/anxious.   All other systems reviewed and are negative.    EKGs/Labs/Other Studies Reviewed:    Studies reviewed were summarized above. The additional studies were reviewed today:  2D echo 07/2019: 1. Left ventricular ejection fraction, by visual estimation, is 40 to  45%. The left ventricle has mild to moderately decreased function. There  is no left ventricular hypertrophy.  2. Left  ventricular diastolic parameters are consistent with Grade I  diastolic dysfunction (impaired relaxation).  3. The left ventricle demonstrates regional wall motion abnormalities.  4. Severe hypokinesis is noted in the Mid to apical anterolateral wall of  the left ventricle (clips 63, 64).  5. Global right ventricle has low normal systolic function.The right  ventricular size is normal. Right vetricular wall thickness was not  assessed.  6. Left atrial size was mildly dilated.  7. Right atrial size was normal.  8. The mitral valve is grossly normal. Trivial mitral valve  regurgitation.  9. The tricuspid valve is normal in structure.  10. The tricuspid valve is normal in structure. Tricuspid valve  regurgitation is not demonstrated.  11. Aortic valve mean gradient measures 7.0 mmHg.  12. Aortic valve peak gradient measures 13.8 mmHg.  13. Aortic valve regurgitation is not visualized.  14. There is a normal functioning bioprosthetic aortic valve present. No  rocking or valve dehiscence is noted.  15. The pulmonic valve was not well visualized. Pulmonic valve  regurgitation is not visualized. __________  Intraoperative TEE 01/2019: 1. Left ventricular ejection fraction, by visual estimation, is 40 to  45%. The left ventricle has mild to moderately decreased function. There  is no left ventricular hypertrophy.  2. Left ventricular diastolic parameters are consistent with Grade I  diastolic dysfunction  (impaired relaxation).  3. The left ventricle demonstrates regional wall motion abnormalities.  4. Severe hypokinesis is noted in the Mid to apical anterolateral wall of  the left ventricle (clips 63, 64).  5. Global right ventricle has low normal systolic function.The right  ventricular size is normal. Right vetricular wall thickness was not  assessed.  6. Left atrial size was mildly dilated.  7. Right atrial size was normal.  8. The mitral valve is grossly normal. Trivial mitral valve  regurgitation.  9. The tricuspid valve is normal in structure.  10. The tricuspid valve is normal in structure. Tricuspid valve  regurgitation is not demonstrated.  11. Aortic valve mean gradient measures 7.0 mmHg.  12. Aortic valve peak gradient measures 13.8 mmHg.  13. Aortic valve regurgitation is not visualized.  14. There is a normal functioning bioprosthetic aortic valve present. No  rocking or valve dehiscence is noted.  15. The pulmonic valve was not well visualized. Pulmonic valve  regurgitation is not visualized. __________  Pre-CABG carotid/upper and lower extremity ABIs: Summary:  Right Carotid: Velocities in the right ICA are consistent with a 1-39%  stenosis.   Left Carotid: Velocities in the left ICA are consistent with a 1-39%  stenosis.  Vertebrals: Bilateral vertebral arteries demonstrate antegrade flow.  Subclavians: Normal flow hemodynamics were seen in bilateral subclavian        arteries.   Right ABI: Resting right ankle-brachial index indicates noncompressible  right lower extremity arteries.The right toe-brachial index is normal.  ABIs are unreliable.  Left ABI: Resting left ankle-brachial index indicates noncompressible left  lower extremity arteries.The left toe-brachial index is normal. ABIs are  unreliable.  Right Upper Extremity: Doppler waveforms remain within normal limits with  right radial compression. Doppler waveforms remain within normal  limits  with right ulnar compression.  Left Upper Extremity: Doppler waveforms remain within normal limits with  left radial compression. Doppler waveforms remain within normal limits  with left ulnar compression. __________  [facility] 12/2018: Conclusions: 1. Significant two-vessel coronary artery disease, including 80% ostial LAD stenosis and 80-90% stenosis of large RCA continuation. 2. Mild to moderate disease noted the proximal/mid LAD,  RCA, and ramus intermedius. 3. Mildly elevated right heart filling pressures. 4. Moderately elevated left heart filling pressures. 5. Severe pulmonary hypertension. 6. Moderately reduced Fick cardiac output/index.  Recommendations: 1. Outpatient cardiac surgery consultation, given significant two-vessel coronary artery disease (LAD stenosis not ideal for PCI) and severe aortic stenosis. 2. Continue current dose of torsemide; will plan for BMP early next week and consider escalation of torsemide +/- addition of low-dose ACEI/ARB. 3. Continue current dose of metoprolol. 4. Aggressive secondary prevention of coronary artery disease. 5. Follow-up in the office in two weeks; may need to consider referral to advanced heart failure clinic in [place] if the patient has recurrent heart failure symptoms or he does not tolerate escalation of diuresis/evidence-based heart failure therapy. __________  Limited 2D echo 11/2018: 1. The left ventricle has moderately reduced systolic function, with an  ejection fraction of 35-40%. There is mildly increased left ventricular  wall thickness. Left ventricular diastolic Doppler parameters are  consistent with impaired relaxation.  2. The mitral valve was not well visualized. Mitral valve regurgitation  is moderate by color flow Doppler.  3. The tricuspid valve was not well visualized. Tricuspid valve  regurgitation is moderate.  4. The aortic valve was not well visualized Moderate thickening of the  aortic valve  Moderate calcification of the aortic valve. Aortic valve  regurgitation is mild to moderate by color flow Doppler. __________  2D echo 11/2018: 1. Severe hypokinesis of the left ventricular, entire anterior wall,  anterolateral wall and apical segment.  2. There is dyskinesis of the left ventricular, basal inferior wall.  3. The left ventricle has moderate-severely reduced systolic function,  with an ejection fraction of 30-35%. The cavity size was normal. There is  mildly increased left ventricular wall thickness. Left ventricular  diastolic Doppler parameters are  consistent with restrictive filling. Elevated mean left atrial pressure.  4. Left ventricular apical thrombus cannot be excluded. Consider limited  echo with Definity or cardiac MRI for further evaluation, as clinically  indicated.  5. Large pleural effusion in the left lateral region.  6. The mitral valve is degenerative. Mild thickening of the mitral valve  leaflet. Mitral valve regurgitation is moderate by color flow Doppler.  7. The tricuspid valve is grossly normal.  8. The aortic valve has an indeterminate number of cusps. Severely  thickening of the aortic valve. Moderate calcification of the aortic  valve. Aortic valve regurgitation is moderate by color flow Doppler.  Severe stenosis of the aortic valve.  9. The interatrial septum was not well visualized.   EKG:  EKG is ordered today.  The EKG ordered today demonstrates sinus rhythm with frequent PACs, 65 bpm, left axis deviation, nonspecific IVCD, poor R wave progression along the precordial leads, no acute ST-T changes, largely unchanged when compared to prior study  Recent Labs: 07/06/2019: Hemoglobin 13.5; Platelets 192.0; TSH 4.34 10/19/2019: Magnesium 2.1 12/05/2019: ALT 14; BUN 26; Creatinine, Ser 1.36; Potassium 4.5; Sodium 138  Recent Lipid Panel    Component Value Date/Time   CHOL 109 12/05/2019 0803   TRIG 47.0 12/05/2019 0803   HDL 39.30  12/05/2019 0803   CHOLHDL 3 12/05/2019 0803   VLDL 9.4 12/05/2019 0803   LDLCALC 60 12/05/2019 0803    PHYSICAL EXAM:    VS:  BP (!) 142/70 (BP Location: Left Arm, Patient Position: Sitting, Cuff Size: Normal)   Pulse 65   Ht 5\' 10"  (1.778 m)   Wt 229 lb (103.9 kg)   SpO2 97%  BMI 32.86 kg/m   BMI: Body mass index is 32.86 kg/m.  Physical Exam Constitutional:      Appearance: He is well-developed.  HENT:     Head: Normocephalic and atraumatic.  Eyes:     General:        Right eye: No discharge.        Left eye: No discharge.  Neck:     Vascular: No JVD.  Cardiovascular:     Rate and Rhythm: Normal rate and regular rhythm.     Pulses: No midsystolic click and no opening snap.          Posterior tibial pulses are 2+ on the right side and 2+ on the left side.     Heart sounds: S1 normal and S2 normal. Heart sounds not distant. Murmur heard.  Systolic murmur is present with a grade of 1/6.  No friction rub.  Pulmonary:     Effort: Pulmonary effort is normal. No respiratory distress.     Breath sounds: Normal breath sounds. No decreased breath sounds, wheezing or rales.  Chest:     Chest wall: No tenderness.  Abdominal:     General: There is no distension.     Palpations: Abdomen is soft.     Tenderness: There is no abdominal tenderness.  Musculoskeletal:     Cervical back: Normal range of motion.     Right lower leg: No edema.     Left lower leg: No edema.  Skin:    General: Skin is warm and dry.     Nails: There is no clubbing.  Neurological:     Mental Status: He is alert and oriented to person, place, and time.  Psychiatric:        Speech: Speech normal.        Behavior: Behavior normal.        Thought Content: Thought content normal.        Judgment: Judgment normal.     Wt Readings from Last 3 Encounters:  01/23/20 229 lb (103.9 kg)  11/09/19 224 lb (101.6 kg)  10/19/19 223 lb 4 oz (101.3 kg)     ASSESSMENT & PLAN:   1. CAD status post CABG  without angina: He is doing well without any symptoms concerning for angina.  Continue secondary prevention with current medical therapy including aspirin, Toprol-XL, losartan, and atorvastatin.  No plans for further ischemic evaluation at this time.  2. HFrEF secondary to ICM: He appears euvolemic and well compensated.  Most recent echo from 07/2019 showed a mild improvement in LV systolic function as outlined above.  Increase losartan to 75 mg daily.  He will otherwise continue current dose Toprol-XL.  Not currently on spironolactone given underlying CKD.  Moving forward, consider tapering off amlodipine with further escalation of losartan to further optimize GDMT and minimize pill burden.  His intermittent right lower extremity appears to be in the setting of SVG vein harvest.  Recommend leg elevation and compression stocking.  Would attempt to minimize escalation of diuretic therapy in an effort to preserve renal function.  CHF education.  3. Aortic stenosis: Status post bioprosthetic AVR in 01/2019.  Follow-up echo in 07/2019 demonstrated a normal functioning bioprosthetic aortic valve with a mean gradient of 7 mmHg.  SBE PPx as needed.  We will send in amoxicillin 500 mg #4, all tabs to be taken 1 hour prior to dental procedure.  4. HTN: Blood pressure is mildly elevated in the office today though improved from prior  readings.  Titrate losartan to 75 mg daily.  Check BMP in 1 week.  He will otherwise continue current dose of triamterene/HCTZ, amlodipine, and Toprol-XL.  Moving forward, if renal function allows could consider further escalation of losartan with discontinuation of amlodipine to further optimize GDMT and minimize his pill burden.  5. HLD: LDL 60 from 11/2019 with normal LFT at that time.  Continue atorvastatin 80 mg daily.  6. CKD stage IIIa: Stable on last check.  Update BMP in 1 week as outlined above.  Disposition: F/u with Dr. Saunders Revel or an APP in 3 months.   Medication  Adjustments/Labs and Tests Ordered: Current medicines are reviewed at length with the patient today.  Concerns regarding medicines are outlined above. Medication changes, Labs and Tests ordered today are summarized above and listed in the Patient Instructions accessible in Encounters.   Signed, Christell Faith, PA-C 01/23/2020 2:59 PM     [address] [place], [place] [address] [phone]

## 2020-01-23 ENCOUNTER — Encounter: Payer: Self-pay | Admitting: Physician Assistant

## 2020-01-23 ENCOUNTER — Ambulatory Visit (INDEPENDENT_AMBULATORY_CARE_PROVIDER_SITE_OTHER): Payer: Medicare HMO | Admitting: Physician Assistant

## 2020-01-23 ENCOUNTER — Other Ambulatory Visit: Payer: Self-pay

## 2020-01-23 VITALS — BP 142/70 | HR 65 | Ht 70.0 in | Wt 229.0 lb

## 2020-01-23 DIAGNOSIS — I255 Ischemic cardiomyopathy: Secondary | ICD-10-CM

## 2020-01-23 DIAGNOSIS — I5022 Chronic systolic (congestive) heart failure: Secondary | ICD-10-CM | POA: Diagnosis not present

## 2020-01-23 DIAGNOSIS — Z951 Presence of aortocoronary bypass graft: Secondary | ICD-10-CM | POA: Diagnosis not present

## 2020-01-23 DIAGNOSIS — I350 Nonrheumatic aortic (valve) stenosis: Secondary | ICD-10-CM | POA: Diagnosis not present

## 2020-01-23 DIAGNOSIS — E785 Hyperlipidemia, unspecified: Secondary | ICD-10-CM | POA: Diagnosis not present

## 2020-01-23 DIAGNOSIS — I2510 Atherosclerotic heart disease of native coronary artery without angina pectoris: Secondary | ICD-10-CM | POA: Diagnosis not present

## 2020-01-23 DIAGNOSIS — N1831 Chronic kidney disease, stage 3a: Secondary | ICD-10-CM

## 2020-01-23 DIAGNOSIS — I10 Essential (primary) hypertension: Secondary | ICD-10-CM | POA: Diagnosis not present

## 2020-01-23 DIAGNOSIS — Z952 Presence of prosthetic heart valve: Secondary | ICD-10-CM | POA: Diagnosis not present

## 2020-01-23 MED ORDER — LOSARTAN POTASSIUM 50 MG PO TABS
75.0000 mg | ORAL_TABLET | Freq: Every day | ORAL | 3 refills | Status: DC
Start: 2020-01-23 — End: 2020-04-23

## 2020-01-23 NOTE — Patient Instructions (Signed)
Medication Instructions:  1- INCREASE Losartan to 75 mg total once daily *If you need a refill on your cardiac medications before your next appointment, please call your pharmacy*   Lab Work: Your physician recommends that you return for lab work in: 1 week at the medical mall. ([facility]) No appt is needed. Hours are M-F 7AM- 6 PM.  If you have labs (blood work) drawn today and your tests are completely normal, you will receive your results only by: Marland Kitchen MyChart Message (if you have MyChart) OR . A paper copy in the mail If you have any lab test that is abnormal or we need to change your treatment, we will call you to review the results.   Testing/Procedures: None ordered   Follow-Up: At [facility], you and your health needs are our priority.  As part of our continuing mission to provide you with exceptional heart care, we have created designated Provider Care Teams.  These Care Teams include your primary Cardiologist (physician) and Advanced Practice Providers (APPs -  Physician Assistants and Nurse Practitioners) who all work together to provide you with the care you need, when you need it.  We recommend signing up for the patient portal called "MyChart".  Sign up information is provided on this After Visit Summary.  MyChart is used to connect with patients for Virtual Visits (Telemedicine).  Patients are able to view lab/test results, encounter notes, upcoming appointments, etc.  Non-urgent messages can be sent to your provider as well.   To learn more about what you can do with MyChart, go to [email or web address].    Your next appointment:   3 month(s)  The format for your next appointment:   In Person  Provider:    You may see Nelva Bush, MD or Christell Faith, PA-C

## 2020-01-29 ENCOUNTER — Other Ambulatory Visit
Admission: RE | Admit: 2020-01-29 | Discharge: 2020-01-29 | Disposition: A | Discharge status: Home / Self Care | Payer: Medicare HMO | Attending: Physician Assistant | Admitting: Physician Assistant

## 2020-01-29 ENCOUNTER — Telehealth: Payer: Self-pay

## 2020-01-29 DIAGNOSIS — I2510 Atherosclerotic heart disease of native coronary artery without angina pectoris: Secondary | ICD-10-CM | POA: Diagnosis not present

## 2020-01-29 LAB — BASIC METABOLIC PANEL
Anion gap: 12 (ref 5–15)
BUN: 27 mg/dL — ABNORMAL HIGH (ref 8–23)
CO2: 23 mmol/L (ref 22–32)
Calcium: 9.7 mg/dL (ref 8.9–10.3)
Chloride: 104 mmol/L (ref 98–111)
Creatinine, Ser: 1.41 mg/dL — ABNORMAL HIGH (ref 0.61–1.24)
GFR calc Af Amer: 52 mL/min — ABNORMAL LOW (ref >60)
GFR calc non Af Amer: 44 mL/min — ABNORMAL LOW (ref >60)
Glucose, Bld: 116 mg/dL — ABNORMAL HIGH (ref 70–99)
Potassium: 4.5 mmol/L (ref 3.5–5.1)
Sodium: 139 mmol/L (ref 135–145)

## 2020-01-29 NOTE — Telephone Encounter (Signed)
-----   Message from Rise Mu, PA-C sent at 01/29/2020  2:59 PM EDT ----- Renal function and potassium are stable.  No changes.

## 2020-01-29 NOTE — Telephone Encounter (Signed)
Call to patient to review labs.    Results left on vm.    Advised pt to call for any further questions or concerns.  No further orders.

## 2020-02-11 ENCOUNTER — Other Ambulatory Visit: Payer: Self-pay | Admitting: Internal Medicine

## 2020-02-19 ENCOUNTER — Other Ambulatory Visit: Payer: Self-pay

## 2020-02-19 ENCOUNTER — Ambulatory Visit (INDEPENDENT_AMBULATORY_CARE_PROVIDER_SITE_OTHER): Payer: Medicare HMO | Admitting: Internal Medicine

## 2020-02-19 DIAGNOSIS — Z951 Presence of aortocoronary bypass graft: Secondary | ICD-10-CM

## 2020-02-19 DIAGNOSIS — Z952 Presence of prosthetic heart valve: Secondary | ICD-10-CM

## 2020-02-19 DIAGNOSIS — I10 Essential (primary) hypertension: Secondary | ICD-10-CM

## 2020-02-19 DIAGNOSIS — I5022 Chronic systolic (congestive) heart failure: Secondary | ICD-10-CM | POA: Diagnosis not present

## 2020-02-19 DIAGNOSIS — E785 Hyperlipidemia, unspecified: Secondary | ICD-10-CM

## 2020-02-19 DIAGNOSIS — I77810 Thoracic aortic ectasia: Secondary | ICD-10-CM

## 2020-02-19 DIAGNOSIS — E1159 Type 2 diabetes mellitus with other circulatory complications: Secondary | ICD-10-CM | POA: Diagnosis not present

## 2020-02-19 MED ORDER — ALLOPURINOL 100 MG PO TABS: 100.0000 mg | ORAL_TABLET | Freq: Every day | ORAL | 3 refills | Status: DC

## 2020-02-19 MED ORDER — AMLODIPINE BESYLATE 5 MG PO TABS
5.0000 mg | ORAL_TABLET | Freq: Every day | ORAL | 3 refills | Status: DC
Start: 2020-02-19 — End: 2021-01-07

## 2020-02-19 NOTE — Progress Notes (Signed)
Patient ID: [name] [name], male   DOB: 1931/03/22, 86 y.o.   MRN: [id number]   Subjective:    Patient ID: [name] [name], male    DOB: 1930-07-14, 86 y.o.   MRN: [id number]  HPI This visit occurred during the SARS-CoV-2 public health emergency.  Safety protocols were in place, including screening questions prior to the visit, additional usage of staff PPE, and extensive cleaning of exam room while observing appropriate contact time as indicated for disinfecting solutions.  Patient here for his physical exam.  He reports he is doing relatively well.  Has known CAD s/p CABG and HFrEF. Also is s/p bioprosthetic AVR in 01/2019. saw cardiology 01/23/20.  No evidence of volume overload on exam.  Losartan increased to 31m q day.  F/up ECHO in 07/2019 demonstrated a normal functioning bioprosthetic aortic valve. Recommended f/u in 3 months.  He tries to stay active.  No chest pain or sob reported.  No abdominal pain or bowel change reported.  No increased lower extremity swelling.  Overall he feels things are stable.    Past Medical History:  Diagnosis Date  . Aortic stenosis   . Arthritis    right shoulder  . BPH (benign prostatic hypertrophy)   . Coronary artery disease   . Gout   . Heart murmur   . History of chicken pox   . Hypercholesterolemia   . Hypertension   . Systolic heart failure (HCC)    ischemic cardiomyopathy  . Wears dentures    full upper and lower   Past Surgical History:  Procedure Laterality Date  . AORTIC VALVE REPLACEMENT N/A 01/25/2019   Procedure: AORTIC VALVE REPLACEMENT (AVR) 225mInspiris valve;  Surgeon: BaGaye PollackMD;  [facility];  Service: Open Heart Surgery;  Laterality: N/A;  . arm surgery     right arm fx s/p "plate insertion"  . CARDIAC CATHETERIZATION    . CATARACT EXTRACTION W/PHACO Right 11/17/2016   Procedure: CATARACT EXTRACTION PHACO AND INTRAOCULAR LENS PLACEMENT ([place] Right;  Surgeon: BrLeandrew KoyanagiMD;  [facility]: Ophthalmology;  Laterality: Right;  . CORONARY ARTERY BYPASS GRAFT N/A 01/25/2019   LIMA-LAD and sequential SVG-rPDA-rPL  . DENTAL SURGERY     all teeth extracted  . RIGHT HEART CATH AND CORONARY ANGIOGRAPHY N/A 01/02/2019   Procedure: RIGHT HEART CATH AND CORONARY ANGIOGRAPHY;  Surgeon: EnNelva BushMD;  [facility];  Service: Cardiovascular;  Laterality: N/A;  . TEE WITHOUT CARDIOVERSION N/A 01/25/2019   Procedure: TRANSESOPHAGEAL ECHOCARDIOGRAM (TEE);  Surgeon: BaGaye PollackMD;  [facility] Service: Open [facility] Surgery;  Laterality: N/A;   Family History  Problem Relation Age of Onset  . Leukemia Father   . Congestive Heart Failure Mother   . Cancer Daughter        Breast Cancer  . Prostate cancer Neg Hx   . Colon cancer Neg Hx    Social History   Socioeconomic History  . Marital status: Married    Spouse name: Not on file  . Number of children: Not on file  . Years of education: Not on file  . Highest education level: Not on file  Occupational History  . Not on file  Tobacco Use  . Smoking status: Never Smoker  . Smokeless tobacco: Current User    Types: Chew  . Tobacco comment: not ready to quit currently  Vaping Use  . Vaping Use: Never used  Substance and  Sexual Activity  . Alcohol use: No    Alcohol/week: 0.0 standard drinks  . Drug use: No  . Sexual activity: Not Currently  Other Topics Concern  . Not on file  Social History Narrative  . Not on file   Social Determinants of Health   Financial Resource Strain:   . Difficulty of Paying Living Expenses: Not on file  Food Insecurity:   . Worried About Charity fundraiser in the Last Year: Not on file  . Ran Out of Food in the Last Year: Not on file  Transportation Needs:   . Lack of Transportation (Medical): Not on file  . Lack of Transportation (Non-Medical): Not on file  Physical Activity:   . Days of Exercise per Week: Not on file  . Minutes of Exercise per  Session: Not on file  Stress:   . Feeling of Stress : Not on file  Social Connections:   . Frequency of Communication with Friends and Family: Not on file  . Frequency of Social Gatherings with Friends and Family: Not on file  . Attends Religious Services: Not on file  . Active Member of Clubs or Organizations: Not on file  . Attends Archivist Meetings: Not on file  . Marital Status: Not on file    Outpatient Encounter Medications as of 02/19/2020  Medication Sig  . allopurinol (ZYLOPRIM) 100 MG tablet Take 1 tablet (100 mg total) by mouth daily.  Marland Kitchen amLODipine (NORVASC) 5 MG tablet Take 1 tablet (5 mg total) by mouth daily.  Marland Kitchen aspirin EC 81 MG tablet Take 1 tablet (81 mg total) by mouth daily.  Marland Kitchen atorvastatin (LIPITOR) 80 MG tablet TAKE 1 TABLET (80 MG TOTAL) BY MOUTH EVERY MORNING.  . LORazepam (ATIVAN) 1 MG tablet Takes 1/2 to 1 tablet q day prn  . losartan (COZAAR) 50 MG tablet Take 1.5 tablets (75 mg total) by mouth daily.  . metoprolol succinate (TOPROL-XL) 25 MG 24 hr tablet Take 1 tablet (25 mg total) by mouth 2 (two) times daily. Take with or immediately following a meal.  . triamterene-hydrochlorothiazide (MAXZIDE-25) 37.5-25 MG tablet Take 0.5 tablets by mouth daily.  . [DISCONTINUED] allopurinol (ZYLOPRIM) 100 MG tablet Take 1 tablet (100 mg total) by mouth daily.  . [DISCONTINUED] amLODipine (NORVASC) 2.5 MG tablet Take 2 tablets (5 mg total) by mouth daily.   No facility-administered encounter medications on file as of 02/19/2020.    Review of Systems  Constitutional: Negative for appetite change and unexpected weight change.  HENT: Negative for congestion and sinus pressure.   Respiratory: Negative for cough, chest tightness and shortness of breath.   Cardiovascular: Negative for chest pain, palpitations and leg swelling.  Gastrointestinal: Negative for abdominal pain, diarrhea, nausea and vomiting.  Genitourinary: Negative for difficulty urinating and  dysuria.  Musculoskeletal: Negative for joint swelling and myalgias.  Skin: Negative for color change and rash.  Neurological: Negative for dizziness, light-headedness and headaches.  Psychiatric/Behavioral: Negative for agitation and dysphoric mood.       Objective:    Physical Exam Vitals reviewed.  Constitutional:      General: He is not in acute distress.    Appearance: Normal appearance. He is well-developed.  HENT:     Head: Normocephalic and atraumatic.     Right Ear: External ear normal.     Left Ear: External ear normal.  Eyes:     General: No scleral icterus.       Right eye: No discharge.  Left eye: No discharge.     Conjunctiva/sclera: Conjunctivae normal.  Cardiovascular:     Rate and Rhythm: Normal rate and regular rhythm.  Pulmonary:     Effort: Pulmonary effort is normal. No respiratory distress.     Breath sounds: Normal breath sounds.  Abdominal:     General: Bowel sounds are normal.     Palpations: Abdomen is soft.     Tenderness: There is no abdominal tenderness.  Musculoskeletal:        General: No swelling or tenderness.     Cervical back: Neck supple. No tenderness.  Lymphadenopathy:     Cervical: No cervical adenopathy.  Skin:    Findings: No erythema or rash.  Neurological:     Mental Status: He is alert.  Psychiatric:        Mood and Affect: Mood normal.        Behavior: Behavior normal.     BP 128/70   Pulse 65   Temp 98 F (36.7 C) (Oral)   Resp 16   Ht _0  (1.778 m)   Wt 232 lb 9.6 oz (105.5 kg)   SpO2 98%   BMI 33.37 kg/m  Wt Readings from Last 3 Encounters:  02/19/20 232 lb 9.6 oz (105.5 kg)  01/23/20 229 lb (103.9 kg)  11/09/19 224 lb (101.6 kg)     Lab Results  Component Value Date   WBC 10.7 (H) 07/06/2019   HGB 13.5 07/06/2019   HCT 39.6 07/06/2019   PLT 192.0 07/06/2019   GLUCOSE 116 (H) 01/29/2020   CHOL 109 12/05/2019   TRIG 47.0 12/05/2019   HDL 39.30 12/05/2019   LDLCALC 60 12/05/2019   ALT 14  12/05/2019   AST 22 12/05/2019   NA 139 01/29/2020   K 4.5 01/29/2020   CL 104 01/29/2020   CREATININE 1.41 (H) 01/29/2020   BUN 27 (H) 01/29/2020   CO2 23 01/29/2020   TSH 4.34 07/06/2019   PSA 4.15 (H) 02/08/2014   INR 1.7 (H) 01/25/2019   HGBA1C 6.5 Repeated and verified X2. 12/05/2019   MICROALBUR 46.0 (H) 12/05/2019       Assessment & Plan:   Problem List Items Addressed This Visit    S/P CABG x 3    S/p CABG/cardiac rehab.  Continue risk factor modification.  Followed by cardiology.  Currently asymptomatic.        S/P aortic valve replacement    Recent ECHO -  Normal functioning bioprosthetic aortic valve.  Losartan increased to 98m q day.  Follow pressures.  No evidence of volume overload on exam.        Hypertension    Blood pressure better.  Losartan just increased to 782mq day.  Continue on 1/2 triam/hctz and metoprolol.  Follow pressures.  Follow metabolic panel.       Relevant Medications   amLODipine (NORVASC) 5 MG tablet   Other Relevant Orders   CBC with Differential/Platelet   Basic metabolic panel   Hyperlipidemia LDL goal <70    On lipitor.  Low cholesterol diet and exercise.  Follow lipid panel and liver function tests.        Relevant Medications   amLODipine (NORVASC) 5 MG tablet   Other Relevant Orders   Hepatic function panel   Lipid panel   Dilatation of thoracic aorta (HCC)    S/p surgery as outlined.  Followed by cardiology.       Relevant Medications   amLODipine (NORVASC) 5 MG tablet  Diabetes ([place])    Low carb diet and exercise.  Follow met b and a1c.       Relevant Orders   Hemoglobin A1c   Chronic HFrEF (heart failure with reduced ejection fraction) ([place])    Followed by cardiology.  Losartan increased to 43m q day.  Continue 1/2 triam/hctz and metoprolol.  No evidence of volume overload on exam.  Follow.       Relevant Medications   amLODipine (NORVASC) 5 MG tablet       CEinar Pheasant MD

## 2020-02-25 ENCOUNTER — Encounter: Payer: Self-pay | Admitting: Internal Medicine

## 2020-02-25 NOTE — Assessment & Plan Note (Signed)
S/p CABG/cardiac rehab.  Continue risk factor modification.  Followed by cardiology.  Currently asymptomatic.

## 2020-02-25 NOTE — Assessment & Plan Note (Signed)
Blood pressure better.  Losartan just increased to 75mg  q day.  Continue on 1/2 triam/hctz and metoprolol.  Follow pressures.  Follow metabolic panel.

## 2020-02-25 NOTE — Assessment & Plan Note (Signed)
On lipitor.  Low cholesterol diet and exercise.  Follow lipid panel and liver function tests.   

## 2020-02-25 NOTE — Assessment & Plan Note (Signed)
S/p surgery as outlined.  Followed by cardiology.

## 2020-02-25 NOTE — Assessment & Plan Note (Signed)
Followed by cardiology.  Losartan increased to 75mg  q day.  Continue 1/2 triam/hctz and metoprolol.  No evidence of volume overload on exam.  Follow.

## 2020-02-25 NOTE — Assessment & Plan Note (Signed)
Low carb diet and exercise.  Follow met b and a1c.  

## 2020-02-25 NOTE — Assessment & Plan Note (Signed)
Recent ECHO -  Normal functioning bioprosthetic aortic valve.  Losartan increased to 75mg  q day.  Follow pressures.  No evidence of volume overload on exam.

## 2020-03-05 ENCOUNTER — Other Ambulatory Visit: Payer: Self-pay

## 2020-03-05 ENCOUNTER — Ambulatory Visit (INDEPENDENT_AMBULATORY_CARE_PROVIDER_SITE_OTHER): Payer: Medicare HMO | Admitting: *Deleted

## 2020-03-05 DIAGNOSIS — Z23 Encounter for immunization: Secondary | ICD-10-CM

## 2020-04-14 NOTE — Progress Notes (Signed)
Cardiology Office Note    Date:  04/23/2020   ID:  [name] [name], DOB [birth year], MRN [id number]  PCP:  Einar Pheasant, MD  Cardiologist:  Nelva Bush, MD  Electrophysiologist:  None   Chief Complaint: Follow up  History of Present Illness:   [name] [name] is a 86 y.o. male with history of multivessel CAD s/p 3-vessel CABG with LIMA-LAD, sequential SVG-rPDA and RPL in 01/2019, HFrEF secondary to ICM complicated by severe aortic stenosis s/p bioprosthetic AVR in 01/2019 at the time of his cardiac bypass,CKD stage III, HTN, HLD, asthma, arthritis, gout, and BPH who presents forfollow up of his CAD, ICM, and aortic valve disease.  He was previous evaluated by [facility] cardiology in 2015 for murmur and underwent nuclear stress test which was unrevealing per his report. Following that, he was lost to follow up. He initially saw his PCP in 11/2018, for a scheduled physical but reported he did not feel well. It was noted he was hypoxic with ambulation back to the exam room with documented O2 saturation of 86 to 89% on room air with some tightness in his throat and upper chest. In this setting, he was sent to the hospital. CTA chest was negative for PE with prominence of the ascending aorta measuring 4.1 x 4.1 cm with aortic atherosclerosis as well as coronary artery calcification and evidence of pulmonary hypertension.Echo on 12/12/2018, showed severe hypokinesis of the entire anterior wall, anterolateral wall, and apical segment with dyskinesis of the basal inferior wall, EF 30 to 35%, Doppler parameters consistent with restrictive filling with elevated mean left atrial pressure, unable to exclude LV apical thrombus, large pleural effusion in the left lateral region, degenerative mitral valve with moderate mitral regurgitation, indeterminate number of aortic valve cusps with severe aortic stenosis with a mean gradient of 42 mmHg and a valve area of 0.58 cm, moderate aortic valve  insufficiency, and low normal RV systolic function, RV systolic pressure at least 65 to 70 mmHg.  He preferred to undergo workup as an outpatient as he wanted to be home to help with his wife. He subsequently underwent R/LHC on 01/02/2019 that showed significant two-vessel CAD, including 80% ostial LAD stenosis and 80-90% stenosis of large RCA continuation, mild to moderate disease noted in the proximal/mid LAD, RCA, and ramus, mildly elevated right heart filling pressures, moderately elevated left heart filling pressures, severe pulmonary hypertension, moderately reduced CO/CI. He was referred to CVTS for consultation of CABG given two-vessel CAD, with the LAD not being ideal for PCI, and in the setting of severe aortic stenosis. Initially, he cancelled his appointment with cardiothoracic surgery.  He was ultimately evaluated by them and underwent 3-vessel CABG and bioprosthetic AVR in 01/2019.  Postoperative echo in 07/2019 showed a mildly improved LVSF with an EF of 40 to 86%, grade 1 diastolic dysfunction, severe hypokinesis noted in the mid to apical anterolateral wall, low normal RV systolic function with normal RV cavity size, mildly dilated left atrium, trivial mitral regurgitation, and a normal functioning bioprosthetic aortic valve without rocking or dehiscence noted with a mean gradient of 7 mmHg.  More recently, his amlodipine was tapered due to lower extremity swelling and losartan was held secondary to elevated SCr.  He was seen in the office in 09/2019 and was feeling well.  He did note his BP continued to run on the high side, though he did not trust the accuracy of his wrist cuff.  He remained on amlodipine and triamterene/HCTZ.  BP at  that visit was 158/82.  Recheck renal function was improved some, and in this setting, he was started on losartan 25 mg daily.  He was seen by his PCP in follow up in 10/2019 with a BP of 134/78.  Follow up labs at that time showed slightly worse renal function and in  this setting, HCTZ was decreased to 12.5 mg daily and losartan was increased to 50 mg daily.  Follow up BMET in 11/2019 showed stable renal function and his current medications were continued.  He was last seen in the office in 01/2020 and was doing well from a cardiac perspective.  Losartan was titrated to 75 mg daily given her mildly elevated BP and to further optimize GDMT.  He comes in doing very well from a cardiac perspective.  Since he was last seen he remains very active working outdoors in [place] for multiple hours at a time without cardiac limitation.  With this, he does note some mild fatigue though nothing unusual for him.  He denies any chest pain, dyspnea, palpitations, dizziness, presyncope, syncope, lower extremity swelling, abdominal distention, orthopnea, PND, or early satiety.  No falls since he was last seen.  He is tolerating all medications without issues.  He has follow-up labs through his PCP later this month.  He does not have any issues or concerns at this time.   Labs independently reviewed: 01/2020 - potassium 4.5, BUN 27, serum creatinine 1.41 11/2019 - albumin 4.5, AST/ALT normal, A1c 6.5, TC 109, TG 47, HDL 39, LDL 60 09/2019 - magnesium 2.1 06/2019- TSH normal, Hgb 13.5, PLT 192    Past Medical History:  Diagnosis Date  . Aortic stenosis   . Arthritis    right shoulder  . BPH (benign prostatic hypertrophy)   . Coronary artery disease   . Gout   . Heart murmur   . History of chicken pox   . Hypercholesterolemia   . Hypertension   . Systolic heart failure (HCC)    ischemic cardiomyopathy  . Wears dentures    full upper and lower    Past Surgical History:  Procedure Laterality Date  . AORTIC VALVE REPLACEMENT N/A 01/25/2019   Procedure: AORTIC VALVE REPLACEMENT (AVR) 34mm Inspiris valve;  Surgeon: Gaye Pollack, MD;  Location: [facility];  Service: Open Heart Surgery;  Laterality: N/A;  . arm surgery     right arm fx s/p "plate insertion"  . CARDIAC  CATHETERIZATION    . CATARACT EXTRACTION W/PHACO Right 11/17/2016   Procedure: CATARACT EXTRACTION PHACO AND INTRAOCULAR LENS PLACEMENT ([place])  Right;  Surgeon: Leandrew Koyanagi, MD;  Location: [facility];  Service: Ophthalmology;  Laterality: Right;  . CORONARY ARTERY BYPASS GRAFT N/A 01/25/2019   LIMA-LAD and sequential SVG-rPDA-rPL  . DENTAL SURGERY     all teeth extracted  . RIGHT HEART CATH AND CORONARY ANGIOGRAPHY N/A 01/02/2019   Procedure: RIGHT HEART CATH AND CORONARY ANGIOGRAPHY;  Surgeon: Nelva Bush, MD;  Location: [facility];  Service: Cardiovascular;  Laterality: N/A;  . TEE WITHOUT CARDIOVERSION N/A 01/25/2019   Procedure: TRANSESOPHAGEAL ECHOCARDIOGRAM (TEE);  Surgeon: Gaye Pollack, MD;  Location: [facility];  Service: Open Heart Surgery;  Laterality: N/A;    Current Medications: Current Meds  Medication Sig  . allopurinol (ZYLOPRIM) 100 MG tablet Take 1 tablet (100 mg total) by mouth daily.  Marland Kitchen amLODipine (NORVASC) 5 MG tablet Take 1 tablet (5 mg total) by mouth daily.  Marland Kitchen aspirin EC 81 MG tablet Take 1 tablet (  81 mg total) by mouth daily.  Marland Kitchen atorvastatin (LIPITOR) 80 MG tablet TAKE 1 TABLET (80 MG TOTAL) BY MOUTH EVERY MORNING.  . LORazepam (ATIVAN) 1 MG tablet Takes 1/2 to 1 tablet q day prn  . losartan (COZAAR) 50 MG tablet Take 1.5 tablets (75 mg total) by mouth daily.  . metoprolol succinate (TOPROL-XL) 25 MG 24 hr tablet Take 1 tablet (25 mg total) by mouth 2 (two) times daily. Take with or immediately following a meal.  . triamterene-hydrochlorothiazide (MAXZIDE-25) 37.5-25 MG tablet Take 0.5 tablets by mouth daily.    Allergies:   Patient has no known allergies.   Social History   Socioeconomic History  . Marital status: Married    Spouse name: Not on file  . Number of children: Not on file  . Years of education: Not on file  . Highest education level: Not on file  Occupational History  . Not on file  Tobacco Use  . Smoking status:  Never Smoker  . Smokeless tobacco: Current User    Types: Chew  . Tobacco comment: not ready to quit currently  Vaping Use  . Vaping Use: Never used  Substance and Sexual Activity  . Alcohol use: No    Alcohol/week: 0.0 standard drinks  . Drug use: No  . Sexual activity: Not Currently  Other Topics Concern  . Not on file  Social History Narrative  . Not on file   Social Determinants of Health   Financial Resource Strain:   . Difficulty of Paying Living Expenses: Not on file  Food Insecurity:   . Worried About Charity fundraiser in the Last Year: Not on file  . Ran Out of Food in the Last Year: Not on file  Transportation Needs:   . Lack of Transportation (Medical): Not on file  . Lack of Transportation (Non-Medical): Not on file  Physical Activity:   . Days of Exercise per Week: Not on file  . Minutes of Exercise per Session: Not on file  Stress:   . Feeling of Stress : Not on file  Social Connections:   . Frequency of Communication with Friends and Family: Not on file  . Frequency of Social Gatherings with Friends and Family: Not on file  . Attends Religious Services: Not on file  . Active Member of Clubs or Organizations: Not on file  . Attends Archivist Meetings: Not on file  . Marital Status: Not on file     Family History:  The patient's family history includes Cancer in his daughter; Congestive Heart Failure in his mother; Leukemia in his father. There is no history of Prostate cancer or Colon cancer.  ROS:   Review of Systems  Constitutional: Negative for chills, diaphoresis, fever, malaise/fatigue and weight loss.  HENT: Negative for congestion.   Eyes: Negative for discharge and redness.  Respiratory: Negative for cough, sputum production, shortness of breath and wheezing.   Cardiovascular: Negative for chest pain, palpitations, orthopnea, claudication, leg swelling and PND.  Gastrointestinal: Negative for abdominal pain, blood in stool,  heartburn, melena, nausea and vomiting.  Musculoskeletal: Negative for falls and myalgias.  Skin: Negative for rash.  Neurological: Negative for dizziness, tingling, tremors, sensory change, speech change, focal weakness, loss of consciousness and weakness.  Endo/Heme/Allergies: Does not bruise/bleed easily.  Psychiatric/Behavioral: Negative for substance abuse. The patient is not nervous/anxious.   All other systems reviewed and are negative.    EKGs/Labs/Other Studies Reviewed:    Studies reviewed were summarized above.  The additional studies were reviewed today:  2D echo 07/2019: 1. Left ventricular ejection fraction, by visual estimation, is 40 to  45%. The left ventricle has mild to moderately decreased function. There  is no left ventricular hypertrophy.  2. Left ventricular diastolic parameters are consistent with Grade I  diastolic dysfunction (impaired relaxation).  3. The left ventricle demonstrates regional wall motion abnormalities.  4. Severe hypokinesis is noted in the Mid to apical anterolateral wall of  the left ventricle (clips 63, 64).  5. Global right ventricle has low normal systolic function.The right  ventricular size is normal. Right vetricular wall thickness was not  assessed.  6. Left atrial size was mildly dilated.  7. Right atrial size was normal.  8. The mitral valve is grossly normal. Trivial mitral valve  regurgitation.  9. The tricuspid valve is normal in structure.  10. The tricuspid valve is normal in structure. Tricuspid valve  regurgitation is not demonstrated.  11. Aortic valve mean gradient measures 7.0 mmHg.  12. Aortic valve peak gradient measures 13.8 mmHg.  13. Aortic valve regurgitation is not visualized.  14. There is a normal functioning bioprosthetic aortic valve present. No  rocking or valve dehiscence is noted.  15. The pulmonic valve was not well visualized. Pulmonic valve  regurgitation is not  visualized. __________  Intraoperative TEE 01/2019: 1. Left ventricular ejection fraction, by visual estimation, is 40 to  45%. The left ventricle has mild to moderately decreased function. There  is no left ventricular hypertrophy.  2. Left ventricular diastolic parameters are consistent with Grade I  diastolic dysfunction (impaired relaxation).  3. The left ventricle demonstrates regional wall motion abnormalities.  4. Severe hypokinesis is noted in the Mid to apical anterolateral wall of  the left ventricle (clips 63, 64).  5. Global right ventricle has low normal systolic function.The right  ventricular size is normal. Right vetricular wall thickness was not  assessed.  6. Left atrial size was mildly dilated.  7. Right atrial size was normal.  8. The mitral valve is grossly normal. Trivial mitral valve  regurgitation.  9. The tricuspid valve is normal in structure.  10. The tricuspid valve is normal in structure. Tricuspid valve  regurgitation is not demonstrated.  11. Aortic valve mean gradient measures 7.0 mmHg.  12. Aortic valve peak gradient measures 13.8 mmHg.  13. Aortic valve regurgitation is not visualized.  14. There is a normal functioning bioprosthetic aortic valve present. No  rocking or valve dehiscence is noted.  15. The pulmonic valve was not well visualized. Pulmonic valve  regurgitation is not visualized. __________  Pre-CABG carotid/upper and lower extremity ABIs: Summary:  Right Carotid: Velocities in the right ICA are consistent with a 1-39%  stenosis.   Left Carotid: Velocities in the left ICA are consistent with a 1-39%  stenosis.  Vertebrals: Bilateral vertebral arteries demonstrate antegrade flow.  Subclavians: Normal flow hemodynamics were seen in bilateral subclavian        arteries.   Right ABI: Resting right ankle-brachial index indicates noncompressible  right lower extremity arteries.The right toe-brachial index is normal.   ABIs are unreliable.  Left ABI: Resting left ankle-brachial index indicates noncompressible left  lower extremity arteries.The left toe-brachial index is normal. ABIs are  unreliable.  Right Upper Extremity: Doppler waveforms remain within normal limits with  right radial compression. Doppler waveforms remain within normal limits  with right ulnar compression.  Left Upper Extremity: Doppler waveforms remain within normal limits with  left radial compression. Doppler  waveforms remain within normal limits  with left ulnar compression. __________  [facility] 12/2018: Conclusions: 1. Significant two-vessel coronary artery disease, including 80% ostial LAD stenosis and 80-90% stenosis of large RCA continuation. 2. Mild to moderate disease noted the proximal/mid LAD, RCA, and ramus intermedius. 3. Mildly elevated right heart filling pressures. 4. Moderately elevated left heart filling pressures. 5. Severe pulmonary hypertension. 6. Moderately reduced Fick cardiac output/index.  Recommendations: 1. Outpatient cardiac surgery consultation, given significant two-vessel coronary artery disease (LAD stenosis not ideal for PCI) and severe aortic stenosis. 2. Continue current dose of torsemide; will plan for BMP early next week and consider escalation of torsemide +/- addition of low-dose ACEI/ARB. 3. Continue current dose of metoprolol. 4. Aggressive secondary prevention of coronary artery disease. 5. Follow-up in the office in two weeks; may need to consider referral to advanced heart failure clinic in [place] if the patient has recurrent heart failure symptoms or he does not tolerate escalation of diuresis/evidence-based heart failure therapy. __________  Limited 2D echo 11/2018: 1. The left ventricle has moderately reduced systolic function, with an  ejection fraction of 35-40%. There is mildly increased left ventricular  wall thickness. Left ventricular diastolic Doppler parameters are   consistent with impaired relaxation.  2. The mitral valve was not well visualized. Mitral valve regurgitation  is moderate by color flow Doppler.  3. The tricuspid valve was not well visualized. Tricuspid valve  regurgitation is moderate.  4. The aortic valve was not well visualized Moderate thickening of the  aortic valve Moderate calcification of the aortic valve. Aortic valve  regurgitation is mild to moderate by color flow Doppler. __________  2D echo 11/2018: 1. Severe hypokinesis of the left ventricular, entire anterior wall,  anterolateral wall and apical segment.  2. There is dyskinesis of the left ventricular, basal inferior wall.  3. The left ventricle has moderate-severely reduced systolic function,  with an ejection fraction of 30-35%. The cavity size was normal. There is  mildly increased left ventricular wall thickness. Left ventricular  diastolic Doppler parameters are  consistent with restrictive filling. Elevated mean left atrial pressure.  4. Left ventricular apical thrombus cannot be excluded. Consider limited  echo with Def[facility] or cardiac MRI for further evaluation, as clinically  indicated.  5. Large pleural effusion in the left lateral region.  6. The mitral valve is degenerative. Mild thickening of the mitral valve  leaflet. Mitral valve regurgitation is moderate by color flow Doppler.  7. The tricuspid valve is grossly normal.  8. The aortic valve has an indeterminate number of cusps. Severely  thickening of the aortic valve. Moderate calcification of the aortic  valve. Aortic valve regurgitation is moderate by color flow Doppler.  Severe stenosis of the aortic valve.  9. The interatrial septum was not well visualized.   EKG:  EKG is ordered today.  The EKG ordered today demonstrates NSR, 66 bpm, left axis deviation, nonspecific IVCD, poor R wave progression along the precordial leads, nonspecific ST-T changes  Recent Labs: 07/06/2019:  Hemoglobin 13.5; Platelets 192.0; TSH 4.34 10/19/2019: Magnesium 2.1 12/05/2019: ALT 14 01/29/2020: BUN 27; Creatinine, Ser 1.41; Potassium 4.5; Sodium 139  Recent Lipid Panel    Component Value Date/Time   CHOL 109 12/05/2019 0803   TRIG 47.0 12/05/2019 0803   HDL 39.30 12/05/2019 0803   CHOLHDL 3 12/05/2019 0803   VLDL 9.4 12/05/2019 0803   LDLCALC 60 12/05/2019 0803    PHYSICAL EXAM:    VS:  BP (!) 160/80 (BP Location: Left  Arm, Patient Position: Sitting, Cuff Size: Normal)   Pulse 66   Ht 5\' 10"  (1.778 m)   Wt 232 lb (105.2 kg)   SpO2 98%   BMI 33.29 kg/m   BMI: Body mass index is 33.29 kg/m.  Physical Exam Constitutional:      Appearance: He is well-developed.  HENT:     Head: Normocephalic and atraumatic.  Eyes:     General:        Right eye: No discharge.        Left eye: No discharge.  Neck:     Vascular: No JVD.  Cardiovascular:     Rate and Rhythm: Normal rate and regular rhythm.     Pulses: No midsystolic click and no opening snap.          Dorsalis pedis pulses are 2+ on the right side and 2+ on the left side.     Heart sounds: S1 normal and S2 normal. Heart sounds not distant. Murmur heard.  Systolic murmur is present with a grade of 1/6 at the upper right sternal border.  No friction rub.  Pulmonary:     Effort: Pulmonary effort is normal. No respiratory distress.     Breath sounds: Normal breath sounds. No decreased breath sounds, wheezing or rales.  Chest:     Chest wall: No tenderness.  Abdominal:     General: There is no distension.     Palpations: Abdomen is soft.     Tenderness: There is no abdominal tenderness.  Musculoskeletal:     Cervical back: Normal range of motion.  Skin:    General: Skin is warm and dry.     Nails: There is no clubbing.  Neurological:     Mental Status: He is alert and oriented to person, place, and time.  Psychiatric:        Speech: Speech normal.        Behavior: Behavior normal.        Thought Content: Thought  content normal.        Judgment: Judgment normal.     Wt Readings from Last 3 Encounters:  04/23/20 232 lb (105.2 kg)  02/19/20 232 lb 9.6 oz (105.5 kg)  01/23/20 229 lb (103.9 kg)     ASSESSMENT & PLAN:   1. CAD status post CABG without angina: He is doing well without any symptoms concerning for angina.  Continue secondary prevention with current medical therapy including aspirin, Toprol-XL, losartan, and atorvastatin.  No indication for further ischemic evaluation at this time.  2. HFrEF secondary to ICM: He appears euvolemic and well compensated and is NYHA class II.  Most recent echo from 07/2019 showed a mild improvement in LV systolic function as outlined above.  Titrate losartan to 100 mg daily.  Based on follow-up labs obtained through his PCPs office later this month we we will plan to transition him from losartan to [facility] if labs allow.  Following this, when he is seen in 07/2020 we will plan to transition him from Toprol-XL to carvedilol.  Not currently on spironolactone given underlying CKD.  Right lower extremity swelling has improved and is not currently an issue.  CHF education discussed.  3. Aortic stenosis status post bioprosthetic AVR in 01/2019: Most recent post procedure echo from 07/2019 demonstrated normal functioning bioprosthetic aortic valve with a mean gradient of 7 mmHg.  Update echo in 07/2020.  At baseline, he has dentures and SBE prophylaxis is not needed at this time as he does  not visits visit the dentist.  Continue ASA.  4. HTN: Blood pressure is mildly elevated in the office today.  Titrate losartan to 100 mg daily.  Otherwise, for now he will remain on Toprol-XL, amlodipine, and Maxide-25.  Moving forward, consider transitioning from Toprol-XL to carvedilol given his underlying cardiomyopathy and to assist with improved BPs.  Low-sodium diet recommended.  5. HLD: LDL 60 from 11/2019 with normal LFT at that time.  He remains on atorvastatin 80 mg daily.  6. CKD  stage III: Stable on last check.  He has labs scheduled through his PCPs office later this month.  I have asked him to notify them to forward these labs to our office for review as well for further escalation of GDMT as outlined above.  Disposition: F/u with Dr. Saunders Revel or an APP after his echo in 07/2020.   Medication Adjustments/Labs and Tests Ordered: Current medicines are reviewed at length with the patient today.  Concerns regarding medicines are outlined above. Medication changes, Labs and Tests ordered today are summarized above and listed in the Patient Instructions accessible in Encounters.   Signed, Christell Faith, PA-C 04/23/2020 3:12 PM     [address] [place], [place] [address] [phone]

## 2020-04-23 ENCOUNTER — Ambulatory Visit: Payer: Medicare HMO | Admitting: Physician Assistant

## 2020-04-23 ENCOUNTER — Other Ambulatory Visit: Payer: Self-pay

## 2020-04-23 ENCOUNTER — Encounter: Payer: Self-pay | Admitting: Physician Assistant

## 2020-04-23 VITALS — BP 160/80 | HR 66 | Ht 70.0 in | Wt 232.0 lb

## 2020-04-23 DIAGNOSIS — I10 Essential (primary) hypertension: Secondary | ICD-10-CM | POA: Diagnosis not present

## 2020-04-23 DIAGNOSIS — E785 Hyperlipidemia, unspecified: Secondary | ICD-10-CM

## 2020-04-23 DIAGNOSIS — Z952 Presence of prosthetic heart valve: Secondary | ICD-10-CM

## 2020-04-23 DIAGNOSIS — I5022 Chronic systolic (congestive) heart failure: Secondary | ICD-10-CM

## 2020-04-23 DIAGNOSIS — Z951 Presence of aortocoronary bypass graft: Secondary | ICD-10-CM

## 2020-04-23 DIAGNOSIS — I2510 Atherosclerotic heart disease of native coronary artery without angina pectoris: Secondary | ICD-10-CM | POA: Diagnosis not present

## 2020-04-23 DIAGNOSIS — I350 Nonrheumatic aortic (valve) stenosis: Secondary | ICD-10-CM

## 2020-04-23 DIAGNOSIS — I255 Ischemic cardiomyopathy: Secondary | ICD-10-CM | POA: Diagnosis not present

## 2020-04-23 DIAGNOSIS — N1831 Chronic kidney disease, stage 3a: Secondary | ICD-10-CM

## 2020-04-23 MED ORDER — LOSARTAN POTASSIUM 100 MG PO TABS: 100.0000 mg | ORAL_TABLET | Freq: Every day | ORAL | 3 refills | Status: DC

## 2020-04-23 NOTE — Patient Instructions (Signed)
Medication Instructions:  Your physician has recommended you make the following change in your medication:   INCREASE Losartan to 100mg  ONCE DAILY. An Rx has been sent to your mail order pharmacy.   *If you need a refill on your cardiac medications before your next appointment, please call your pharmacy*   Lab Work: None ordered.  If you have labs (blood work) drawn today and your tests are completely normal, you will receive your results only by: Marland Kitchen MyChart Message (if you have MyChart) OR . A paper copy in the mail If you have any lab test that is abnormal or we need to change your treatment, we will call you to review the results.   Testing/Procedures:  Your physician has requested that you have an echocardiogram in February 2022. Echocardiography is a painless test that uses sound waves to create images of your heart. It provides your doctor with information about the size and shape of your heart and how well your heart's chambers and valves are working. This procedure takes approximately one hour. There are no restrictions for this procedure.    Follow-Up: At [facility], you and your health needs are our priority.  As part of our continuing mission to provide you with exceptional heart care, we have created designated Provider Care Teams.  These Care Teams include your primary Cardiologist (physician) and Advanced Practice Providers (APPs -  Physician Assistants and Nurse Practitioners) who all work together to provide you with the care you need, when you need it.  We recommend signing up for the patient portal called "MyChart".  Sign up information is provided on this After Visit Summary.  MyChart is used to connect with patients for Virtual Visits (Telemedicine).  Patients are able to view lab/test results, encounter notes, upcoming appointments, etc.  Non-urgent messages can be sent to your provider as well.   To learn more about what you can do with MyChart, go to  [email or web address].    Your next appointment:    Follow up after Echocardiogram  The format for your next appointment:   In Person  Provider:   You may see Nelva Bush, MD or one of the following Advanced Practice Providers on your designated Care Team:    Murray Hodgkins, NP  Christell Faith, PA-C  Marrianne Mood, PA-C  Cadence Kathlen Mody, [place]    Other Instructions None

## 2020-05-11 ENCOUNTER — Other Ambulatory Visit: Payer: Self-pay | Admitting: Physician Assistant

## 2020-05-23 ENCOUNTER — Other Ambulatory Visit (INDEPENDENT_AMBULATORY_CARE_PROVIDER_SITE_OTHER): Payer: Medicare HMO

## 2020-05-23 ENCOUNTER — Other Ambulatory Visit: Payer: Self-pay

## 2020-05-23 DIAGNOSIS — E1159 Type 2 diabetes mellitus with other circulatory complications: Secondary | ICD-10-CM

## 2020-05-23 DIAGNOSIS — E785 Hyperlipidemia, unspecified: Secondary | ICD-10-CM

## 2020-05-23 DIAGNOSIS — I10 Essential (primary) hypertension: Secondary | ICD-10-CM | POA: Diagnosis not present

## 2020-05-23 LAB — BASIC METABOLIC PANEL
BUN: 32 mg/dL — ABNORMAL HIGH (ref 6–23)
CO2: 27 mEq/L (ref 19–32)
Calcium: 9.7 mg/dL (ref 8.4–10.5)
Chloride: 106 mEq/L (ref 96–112)
Creatinine, Ser: 1.6 mg/dL — ABNORMAL HIGH (ref 0.40–1.50)
GFR: 38.42 mL/min — ABNORMAL LOW (ref >60.00)
Glucose, Bld: 115 mg/dL — ABNORMAL HIGH (ref 70–99)
Potassium: 4.4 mEq/L (ref 3.5–5.1)
Sodium: 141 mEq/L (ref 135–145)

## 2020-05-23 LAB — LIPID PANEL
Cholesterol: 100 mg/dL (ref 0–200)
HDL: 33.5 mg/dL — ABNORMAL LOW (ref >39.00)
LDL Cholesterol: 58 mg/dL (ref 0–99)
NonHDL: 66.52
Total CHOL/HDL Ratio: 3
Triglycerides: 44 mg/dL (ref 0.0–149.0)
VLDL: 8.8 mg/dL (ref 0.0–40.0)

## 2020-05-23 LAB — CBC WITH DIFFERENTIAL/PLATELET
Basophils Absolute: 0.1 10*3/uL (ref 0.0–0.1)
Basophils Relative: 0.9 % (ref 0.0–3.0)
Eosinophils Absolute: 0.3 10*3/uL (ref 0.0–0.7)
Eosinophils Relative: 2.9 % (ref 0.0–5.0)
HCT: 39.4 % (ref 39.0–52.0)
Hemoglobin: 13.2 g/dL (ref 13.0–17.0)
Lymphocytes Relative: 47.8 % — ABNORMAL HIGH (ref 12.0–46.0)
Lymphs Abs: 4.6 10*3/uL — ABNORMAL HIGH (ref 0.7–4.0)
MCHC: 33.6 g/dL (ref 30.0–36.0)
MCV: 99.6 fl (ref 78.0–100.0)
Monocytes Absolute: 0.8 10*3/uL (ref 0.1–1.0)
Monocytes Relative: 8.7 % (ref 3.0–12.0)
Neutro Abs: 3.8 10*3/uL (ref 1.4–7.7)
Neutrophils Relative %: 39.7 % — ABNORMAL LOW (ref 43.0–77.0)
Platelets: 166 10*3/uL (ref 150.0–400.0)
RBC: 3.96 Mil/uL — ABNORMAL LOW (ref 4.22–5.81)
RDW: 13.6 % (ref 11.5–15.5)
WBC: 9.6 10*3/uL (ref 4.0–10.5)

## 2020-05-23 LAB — HEPATIC FUNCTION PANEL
ALT: 13 U/L (ref 0–53)
AST: 21 U/L (ref 0–37)
Albumin: 4.5 g/dL (ref 3.5–5.2)
Alkaline Phosphatase: 74 U/L (ref 39–117)
Bilirubin, Direct: 0.1 mg/dL (ref 0.0–0.3)
Total Bilirubin: 0.6 mg/dL (ref 0.2–1.2)
Total Protein: 6.7 g/dL (ref 6.0–8.3)

## 2020-05-23 LAB — HEMOGLOBIN A1C: Hgb A1c MFr Bld: 6.4 % (ref 4.6–6.5)

## 2020-05-27 ENCOUNTER — Encounter: Payer: Self-pay | Admitting: Internal Medicine

## 2020-05-27 ENCOUNTER — Other Ambulatory Visit: Payer: Self-pay

## 2020-05-27 ENCOUNTER — Ambulatory Visit (INDEPENDENT_AMBULATORY_CARE_PROVIDER_SITE_OTHER): Payer: Medicare HMO | Admitting: Internal Medicine

## 2020-05-27 VITALS — BP 126/70 | HR 66 | Temp 97.6°F | Ht 70.0 in | Wt 230.0 lb

## 2020-05-27 DIAGNOSIS — I2510 Atherosclerotic heart disease of native coronary artery without angina pectoris: Secondary | ICD-10-CM

## 2020-05-27 DIAGNOSIS — Z0000 Encounter for general adult medical examination without abnormal findings: Secondary | ICD-10-CM

## 2020-05-27 DIAGNOSIS — I5022 Chronic systolic (congestive) heart failure: Secondary | ICD-10-CM | POA: Diagnosis not present

## 2020-05-27 DIAGNOSIS — E1159 Type 2 diabetes mellitus with other circulatory complications: Secondary | ICD-10-CM

## 2020-05-27 DIAGNOSIS — I77810 Thoracic aortic ectasia: Secondary | ICD-10-CM | POA: Diagnosis not present

## 2020-05-27 DIAGNOSIS — N289 Disorder of kidney and ureter, unspecified: Secondary | ICD-10-CM

## 2020-05-27 DIAGNOSIS — I10 Essential (primary) hypertension: Secondary | ICD-10-CM

## 2020-05-27 DIAGNOSIS — R944 Abnormal results of kidney function studies: Secondary | ICD-10-CM | POA: Diagnosis not present

## 2020-05-27 NOTE — Patient Instructions (Signed)
Hold the triam/hctz

## 2020-05-27 NOTE — Progress Notes (Signed)
Patient ID: [name] [name], male   DOB: 21-Dec-1930, 86 y.o.   MRN: [id number]   Subjective:    Patient ID: [name] [name], male    DOB: 23-Feb-1931, 86 y.o.   MRN: [id number]  HPI This visit occurred during the SARS-CoV-2 public health emergency.  Safety protocols were in place, including screening questions prior to the visit, additional usage of staff PPE, and extensive cleaning of exam room while observing appropriate contact time as indicated for disinfecting solutions.  Patient here for his physical exam.  He is doing well.  Feels good.  No chest pain or sob reported.  Breathing stable.  No acid reflux or abdominal pain reported.  Bowels moving.  Saw cardiology.  On triam/hctz.  Discussed changing to entresto.  Blood pressure doing well.    Past Medical History:  Diagnosis Date  . Aortic stenosis   . Arthritis    right shoulder  . BPH (benign prostatic hypertrophy)   . Coronary artery disease   . Gout   . Heart murmur   . History of chicken pox   . Hypercholesterolemia   . Hypertension   . Systolic heart failure (HCC)    ischemic cardiomyopathy  . Wears dentures    full upper and lower   Past Surgical History:  Procedure Laterality Date  . AORTIC VALVE REPLACEMENT N/A 01/25/2019   Procedure: AORTIC VALVE REPLACEMENT (AVR) 31mm Inspiris valve;  Surgeon: Gaye Pollack, MD;  Location: [facility];  Service: Open Heart Surgery;  Laterality: N/A;  . arm surgery     right arm fx s/p "plate insertion"  . CARDIAC CATHETERIZATION    . CATARACT EXTRACTION W/PHACO Right 11/17/2016   Procedure: CATARACT EXTRACTION PHACO AND INTRAOCULAR LENS PLACEMENT ([place])  Right;  Surgeon: Leandrew Koyanagi, MD;  Location: [facility];  Service: Ophthalmology;  Laterality: Right;  . CORONARY ARTERY BYPASS GRAFT N/A 01/25/2019   LIMA-LAD and sequential SVG-[id number]-rPL  . DENTAL SURGERY     all teeth extracted  . RIGHT HEART CATH AND CORONARY ANGIOGRAPHY N/A 01/02/2019   Procedure: RIGHT HEART CATH  AND CORONARY ANGIOGRAPHY;  Surgeon: Nelva Bush, MD;  Location: [facility];  Service: Cardiovascular;  Laterality: N/A;  . TEE WITHOUT CARDIOVERSION N/A 01/25/2019   Procedure: TRANSESOPHAGEAL ECHOCARDIOGRAM (TEE);  Surgeon: Gaye Pollack, MD;  Location: [facility];  Service: Open Heart Surgery;  Laterality: N/A;   Family History  Problem Relation Age of Onset  . Leukemia Father   . Congestive Heart Failure Mother   . Cancer Daughter        Breast Cancer  . Prostate cancer Neg Hx   . Colon cancer Neg Hx    Social History   Socioeconomic History  . Marital status: Married    Spouse name: Not on file  . Number of children: Not on file  . Years of education: Not on file  . Highest education level: Not on file  Occupational History  . Not on file  Tobacco Use  . Smoking status: Never Smoker  . Smokeless tobacco: Current User    Types: Chew  . Tobacco comment: not ready to quit currently  Vaping Use  . Vaping Use: Never used  Substance and Sexual Activity  . Alcohol use: No    Alcohol/week: 0.0 standard drinks  . Drug use: No  . Sexual activity: Not Currently  Other Topics Concern  . Not on file  Social History Narrative  . Not on file   Social Determinants  of Health   Financial Resource Strain: Not on file  Food Insecurity: Not on file  Transportation Needs: Not on file  Physical Activity: Not on file  Stress: Not on file  Social Connections: Not on file    Outpatient Encounter Medications as of 05/27/2020  Medication Sig  . allopurinol (ZYLOPRIM) 100 MG tablet Take 1 tablet (100 mg total) by mouth daily.  Marland Kitchen amLODipine (NORVASC) 5 MG tablet Take 1 tablet (5 mg total) by mouth daily.  Marland Kitchen aspirin EC 81 MG tablet Take 1 tablet (81 mg total) by mouth daily.  Marland Kitchen atorvastatin (LIPITOR) 80 MG tablet TAKE 1 TABLET (80 MG TOTAL) BY MOUTH EVERY MORNING.  . LORazepam (ATIVAN) 1 MG tablet Takes 1/2 to 1 tablet q day prn  . losartan (COZAAR) 100 MG tablet Take 1 tablet  (100 mg total) by mouth daily.  . metoprolol succinate (TOPROL-XL) 25 MG 24 hr tablet Take 1 tablet (25 mg total) by mouth 2 (two) times daily. Take with or immediately following a meal.  . [DISCONTINUED] triamterene-hydrochlorothiazide (MAXZIDE-25) 37.5-25 MG tablet Take 0.5 tablets by mouth daily.   No facility-administered encounter medications on file as of 05/27/2020.    Review of Systems  Constitutional: Negative for appetite change and unexpected weight change.  HENT: Negative for congestion, sinus pressure and sore throat.   Eyes: Negative for pain and visual disturbance.  Respiratory: Negative for cough, chest tightness and shortness of breath.   Cardiovascular: Negative for chest pain, palpitations and leg swelling.  Gastrointestinal: Negative for abdominal pain, diarrhea, nausea and vomiting.  Genitourinary: Negative for difficulty urinating and dysuria.  Musculoskeletal: Negative for joint swelling and myalgias.  Skin: Negative for color change and rash.  Neurological: Negative for dizziness, light-headedness and headaches.  Hematological: Negative for adenopathy. Does not bruise/bleed easily.  Psychiatric/Behavioral: Negative for agitation and dysphoric mood.       Objective:    Physical Exam Vitals reviewed.  Constitutional:      General: He is not in acute distress.    Appearance: Normal appearance. He is well-developed and well-nourished.  HENT:     Head: Normocephalic and atraumatic.     Right Ear: External ear normal.     Left Ear: External ear normal.     Mouth/Throat:     Mouth: Oropharynx is clear and moist.  Eyes:     General: No scleral icterus.       Right eye: No discharge.        Left eye: No discharge.     Conjunctiva/sclera: Conjunctivae normal.  Neck:     Thyroid: No thyromegaly.  Cardiovascular:     Rate and Rhythm: Normal rate and regular rhythm.  Pulmonary:     Effort: No respiratory distress.     Breath sounds: Normal breath sounds. No  wheezing.  Abdominal:     General: Bowel sounds are normal.     Palpations: Abdomen is soft.     Tenderness: There is no abdominal tenderness.  Genitourinary:    Penis: Normal.      Rectum: Normal.  Musculoskeletal:        General: No swelling, tenderness or edema.     Cervical back: Neck supple. No tenderness.  Lymphadenopathy:     Cervical: No cervical adenopathy.  Skin:    Findings: No erythema or rash.  Neurological:     Mental Status: He is alert and oriented to person, place, and time.  Psychiatric:        Mood and  Affect: Mood and affect and mood normal.        Behavior: Behavior normal.     BP 126/70 (BP Location: Left Arm, Patient Position: Standing, Cuff Size: Normal)   Pulse 66   Temp 97.6 F (36.4 C) (Oral)   Ht $R'5\' 10"'Sr$  (1.778 m)   Wt 230 lb (104.3 kg)   SpO2 95%   BMI 33.00 kg/m  Wt Readings from Last 3 Encounters:  05/27/20 230 lb (104.3 kg)  04/23/20 232 lb (105.2 kg)  02/19/20 232 lb 9.6 oz (105.5 kg)     Lab Results  Component Value Date   WBC 9.6 05/23/2020   HGB 13.2 05/23/2020   HCT 39.4 05/23/2020   PLT 166.0 05/23/2020   GLUCOSE 115 (H) 05/23/2020   CHOL 100 05/23/2020   TRIG 44.0 05/23/2020   HDL 33.50 (L) 05/23/2020   LDLCALC 58 05/23/2020   ALT 13 05/23/2020   AST 21 05/23/2020   NA 141 05/23/2020   K 4.4 05/23/2020   CL 106 05/23/2020   CREATININE 1.60 (H) 05/23/2020   BUN 32 (H) 05/23/2020   CO2 27 05/23/2020   TSH 4.34 07/06/2019   PSA 4.15 (H) 02/08/2014   INR 1.7 (H) 01/25/2019   HGBA1C 6.4 05/23/2020   MICROALBUR 46.0 (H) 12/05/2019       Assessment & Plan:   Problem List Items Addressed This Visit    Renal insufficiency    Increased creatinine/decreased GFR.  Hold triam/hctz.  Recheck metabolic panel.  Will need to forward labs to cardiology.        Hypertension    Blood pressure doing well on current regimen. Continues on losartan, metoprolol and triam/hctz (1/2 tablet).  given decreased GFR, will hold  triam/hctz.  Follow pressures.  Follow metabolic panel.  Will have blood pressure check when returns for f/u met b      Health care maintenance    Physical today 05/27/20.  Declines prostate checks and psa check.  Colonoscopy 2012 - internal hemorrhoids.  No f/u recommended.        Dilatation of thoracic aorta (HCC)    S/p surgery.  Followed by cardiology.       Diabetes ([place])    Low carb diet and exercise.  Follow met b and a1c.       Coronary artery disease involving native coronary artery of native heart without angina pectoris    S/p CABG and AVR.  Continue risk factor modification.  Followed by Dr Fletcher Anon.       Chronic HFrEF (heart failure with reduced ejection fraction) ([place])    Followed by cardiology.  Continues losartan and metoprolol. On triam/hctz.  No evidence of volume overload.  Check metabolic panel.        Other Visit Diagnoses    Routine general medical examination at a health care facility    -  Primary   Decreased GFR       Relevant Orders   Basic metabolic panel       Einar Pheasant, MD

## 2020-06-01 ENCOUNTER — Encounter: Payer: Self-pay | Admitting: Internal Medicine

## 2020-06-01 NOTE — Assessment & Plan Note (Signed)
Low carb diet and exercise.  Follow met b and a1c.  

## 2020-06-01 NOTE — Assessment & Plan Note (Signed)
Physical today 05/27/20.  Declines prostate checks and psa check.  Colonoscopy 2012 - internal hemorrhoids.  No f/u recommended.

## 2020-06-01 NOTE — Assessment & Plan Note (Signed)
Increased creatinine/decreased GFR.  Hold triam/hctz.  Recheck metabolic panel.  Will need to forward labs to cardiology.

## 2020-06-01 NOTE — Assessment & Plan Note (Signed)
S/p surgery.  Followed by cardiology.

## 2020-06-01 NOTE — Assessment & Plan Note (Signed)
S/p CABG and AVR.  Continue risk factor modification.  Followed by Dr Fletcher Anon.

## 2020-06-01 NOTE — Assessment & Plan Note (Signed)
Followed by cardiology.  Continues losartan and metoprolol. On triam/hctz.  No evidence of volume overload.  Check metabolic panel.

## 2020-06-01 NOTE — Assessment & Plan Note (Addendum)
Blood pressure doing well on current regimen. Continues on losartan, metoprolol and triam/hctz (1/2 tablet).  given decreased GFR, will hold triam/hctz.  Follow pressures.  Follow metabolic panel.  Will have blood pressure check when returns for f/u met b

## 2020-06-03 ENCOUNTER — Other Ambulatory Visit: Payer: Self-pay

## 2020-06-03 ENCOUNTER — Ambulatory Visit (INDEPENDENT_AMBULATORY_CARE_PROVIDER_SITE_OTHER): Payer: Medicare HMO

## 2020-06-03 DIAGNOSIS — R208 Other disturbances of skin sensation: Secondary | ICD-10-CM | POA: Diagnosis not present

## 2020-06-03 DIAGNOSIS — D2271 Melanocytic nevi of right lower limb, including hip: Secondary | ICD-10-CM | POA: Diagnosis not present

## 2020-06-03 DIAGNOSIS — I10 Essential (primary) hypertension: Secondary | ICD-10-CM

## 2020-06-03 DIAGNOSIS — L820 Inflamed seborrheic keratosis: Secondary | ICD-10-CM | POA: Diagnosis not present

## 2020-06-03 DIAGNOSIS — X32XXXA Exposure to sunlight, initial encounter: Secondary | ICD-10-CM | POA: Diagnosis not present

## 2020-06-03 DIAGNOSIS — Z85828 Personal history of other malignant neoplasm of skin: Secondary | ICD-10-CM | POA: Diagnosis not present

## 2020-06-03 DIAGNOSIS — D2261 Melanocytic nevi of right upper limb, including shoulder: Secondary | ICD-10-CM | POA: Diagnosis not present

## 2020-06-03 DIAGNOSIS — R944 Abnormal results of kidney function studies: Secondary | ICD-10-CM | POA: Diagnosis not present

## 2020-06-03 DIAGNOSIS — D2262 Melanocytic nevi of left upper limb, including shoulder: Secondary | ICD-10-CM | POA: Diagnosis not present

## 2020-06-03 DIAGNOSIS — D225 Melanocytic nevi of trunk: Secondary | ICD-10-CM | POA: Diagnosis not present

## 2020-06-03 DIAGNOSIS — L570 Actinic keratosis: Secondary | ICD-10-CM | POA: Diagnosis not present

## 2020-06-03 NOTE — Progress Notes (Signed)
Patient is here for a BP check due to bp being high at last visit, as per patient.  Currently patients BP is 156/64 and BPM is 81.  Patient has no complaints of headaches, blurry vision, chest pain, arm pain, light headedness, dizziness, and nor jaw pain. Please see previous note for order.

## 2020-06-04 ENCOUNTER — Other Ambulatory Visit: Payer: Self-pay | Admitting: Internal Medicine

## 2020-06-04 LAB — BASIC METABOLIC PANEL
BUN: 21 mg/dL (ref 6–23)
CO2: 24 mEq/L (ref 19–32)
Calcium: 9 mg/dL (ref 8.4–10.5)
Chloride: 105 mEq/L (ref 96–112)
Creatinine, Ser: 1.31 mg/dL (ref 0.40–1.50)
GFR: 48.83 mL/min — ABNORMAL LOW (ref >60.00)
Glucose, Bld: 177 mg/dL — ABNORMAL HIGH (ref 70–99)
Potassium: 3.7 mEq/L (ref 3.5–5.1)
Sodium: 139 mEq/L (ref 135–145)

## 2020-06-24 ENCOUNTER — Telehealth: Payer: Self-pay | Admitting: Internal Medicine

## 2020-06-24 NOTE — Telephone Encounter (Signed)
-----   Message from Sondra Barges, [place] sent at 06/15/2020  6:01 PM EST ----- Regarding: RE: update Hi Dr. Lorin Picket,   Sorry for the delay in getting back with you. I was out of the office this past week. Thanks for the update on Mr. Laymon. With him being prerenal on triam/hctz, I like the idea of [place] avoiding a diuretic, if possible. Can you go ahead and change him from Toprol XL 25 mg bid to Coreg 6.25 mg bid (dose equivalent to Toprol 25 mg bid). With his BP being 126/70 at your visit, I am a little hesitant to take him up a dose on the Coreg right off the bat. I can follow up on this and his BP when I see him in early 07/2020 to possibly add Entresto to further optimize his regimen, pending his follow up echo in 07/2020.  Thanks again,   Alycia Rossetti ----- Message ----- From: Dale [place], MD Sent: 06/06/2020   5:51 AM EST To: Sondra Barges, PA-C Subject: update                                         You recently saw Mr Mcginley.  In reviewing your note, you had made mention of starting entrestro or changing metoprolol to carvedilol for better blood pressure control.  When I saw him in the office, his blood pressure was doing well.  His labs revealed decreased GFR (GFR 38).   I had him hold his triam/hctz.  GFR improved on recheck, but his blood pressure increased - 150 systolic.  I wanted to give you an update.  Was not sure if you wanted me to go ahead and change metoprolol to carvedilol and leave him off triam/hctz - to see if can get blood pressure controlled without the diuretic or if you wanted to see him back to discuss starting entrestro, etc.  Just let me know.  Thank you for your help with Mr Stannard and all of my patients.  I appreciate it.    Thank you.  Guhan Bruington

## 2020-06-24 NOTE — Telephone Encounter (Signed)
Patient has been scheduled for appt to discuss. Confirmed doing ok.

## 2020-06-24 NOTE — Telephone Encounter (Signed)
Please call Mr [name] [name] and let him know that I did f/u with cardiology regarding his blood pressure and changing his medication.  Confirm he is doing ok.  See if he is agreeable for an appt to f/u regarding his blood pressure and plans to adjust his blood pressure medication.  I would like to make some changes for more optimal control.

## 2020-06-27 ENCOUNTER — Encounter: Payer: Self-pay | Admitting: Internal Medicine

## 2020-06-27 ENCOUNTER — Other Ambulatory Visit: Payer: Self-pay

## 2020-06-27 ENCOUNTER — Ambulatory Visit (INDEPENDENT_AMBULATORY_CARE_PROVIDER_SITE_OTHER): Payer: Medicare HMO | Admitting: Internal Medicine

## 2020-06-27 DIAGNOSIS — I5022 Chronic systolic (congestive) heart failure: Secondary | ICD-10-CM | POA: Diagnosis not present

## 2020-06-27 DIAGNOSIS — I77810 Thoracic aortic ectasia: Secondary | ICD-10-CM | POA: Diagnosis not present

## 2020-06-27 DIAGNOSIS — R600 Localized edema: Secondary | ICD-10-CM | POA: Diagnosis not present

## 2020-06-27 DIAGNOSIS — E785 Hyperlipidemia, unspecified: Secondary | ICD-10-CM | POA: Diagnosis not present

## 2020-06-27 DIAGNOSIS — Z952 Presence of prosthetic heart valve: Secondary | ICD-10-CM

## 2020-06-27 DIAGNOSIS — E1159 Type 2 diabetes mellitus with other circulatory complications: Secondary | ICD-10-CM

## 2020-06-27 DIAGNOSIS — I10 Essential (primary) hypertension: Secondary | ICD-10-CM | POA: Diagnosis not present

## 2020-06-27 LAB — HM DIABETES FOOT EXAM

## 2020-06-27 MED ORDER — CARVEDILOL 6.25 MG PO TABS: 6.2500 mg | ORAL_TABLET | Freq: Two times a day (BID) | ORAL | 1 refills | Status: DC

## 2020-06-27 MED ORDER — CARVEDILOL 6.25 MG PO TABS: 6.2500 mg | ORAL_TABLET | Freq: Two times a day (BID) | ORAL | 0 refills | Status: DC

## 2020-06-27 MED ORDER — CARVEDILOL 6.25 MG PO TABS
6.2500 mg | ORAL_TABLET | Freq: Two times a day (BID) | ORAL | 0 refills | Status: DC
Start: 2020-06-27 — End: 2020-06-27

## 2020-06-27 NOTE — Patient Instructions (Signed)
Stop metoprolol  Start carvedilol 6.25mg  - take one tablet 2x/day.

## 2020-06-27 NOTE — Progress Notes (Signed)
Patient ID: [name] [name], male   DOB: January 22, 1931, 87 y.o.   MRN: [id number]   Subjective:    Patient ID: [name] [name], male    DOB: Jan 10, 1931, 87 y.o.   MRN: [id number]  HPI This visit occurred during the SARS-CoV-2 public health emergency.  Safety protocols were in place, including screening questions prior to the visit, additional usage of staff PPE, and extensive cleaning of exam room while observing appropriate contact time as indicated for disinfecting solutions.  Patient here for work in appt to follow up regarding his blood pressure.  Recently found to have decreased GFR.  hctz stopped.  Blood pressure elevated.  He is currently taking losartan $RemoveBeforeD'100mg'wHYFcjXKvzLnly$  q day, amlodipine $RemoveBeforeD'5mg'GrLplqMruzhchI$  q day and metoprolol $RemoveBefore'25mg'rkEHcUqqsOGRT$  bid.  Reports some mild ankle swelling - right > left. Better in am.  Not wearing compression hose.  No chest pain.  Breathing stable.  No acid reflux.  No abdominal pain.  Bowels moving.     Past Medical History:  Diagnosis Date  . Aortic stenosis   . Arthritis    right shoulder  . BPH (benign prostatic hypertrophy)   . Coronary artery disease   . Gout   . Heart murmur   . History of chicken pox   . Hypercholesterolemia   . Hypertension   . Systolic heart failure (HCC)    ischemic cardiomyopathy  . Wears dentures    full upper and lower   Past Surgical History:  Procedure Laterality Date  . AORTIC VALVE REPLACEMENT N/A 01/25/2019   Procedure: AORTIC VALVE REPLACEMENT (AVR) 15mm Inspiris valve;  Surgeon: Gaye Pollack, MD;  Location: [facility];  Service: Open Heart Surgery;  Laterality: N/A;  . arm surgery     right arm fx s/p "plate insertion"  . CARDIAC CATHETERIZATION    . CATARACT EXTRACTION W/PHACO Right 11/17/2016   Procedure: CATARACT EXTRACTION PHACO AND INTRAOCULAR LENS PLACEMENT ([place])  Right;  Surgeon: Leandrew Koyanagi, MD;  Location: [facility];  Service: Ophthalmology;  Laterality: Right;  . CORONARY ARTERY BYPASS GRAFT N/A 01/25/2019   LIMA-LAD and  sequential SVG-rPDA-rPL  . DENTAL SURGERY     all teeth extracted  . RIGHT HEART CATH AND CORONARY ANGIOGRAPHY N/A 01/02/2019   Procedure: RIGHT HEART CATH AND CORONARY ANGIOGRAPHY;  Surgeon: Nelva Bush, MD;  Location: [facility];  Service: Cardiovascular;  Laterality: N/A;  . TEE WITHOUT CARDIOVERSION N/A 01/25/2019   Procedure: TRANSESOPHAGEAL ECHOCARDIOGRAM (TEE);  Surgeon: Gaye Pollack, MD;  Location: [facility];  Service: Open Heart Surgery;  Laterality: N/A;   Family History  Problem Relation Age of Onset  . Leukemia Father   . Congestive Heart Failure Mother   . Cancer Daughter        Breast Cancer  . Prostate cancer Neg Hx   . Colon cancer Neg Hx    Social History   Socioeconomic History  . Marital status: Married    Spouse name: Not on file  . Number of children: Not on file  . Years of education: Not on file  . Highest education level: Not on file  Occupational History  . Not on file  Tobacco Use  . Smoking status: Never Smoker  . Smokeless tobacco: Current User    Types: Chew  . Tobacco comment: not ready to quit currently  Vaping Use  . Vaping Use: Never used  Substance and Sexual Activity  . Alcohol use: No    Alcohol/week: 0.0 standard drinks  . Drug  use: No  . Sexual activity: Not Currently  Other Topics Concern  . Not on file  Social History Narrative  . Not on file   Social Determinants of Health   Financial Resource Strain: Not on file  Food Insecurity: Not on file  Transportation Needs: Not on file  Physical Activity: Not on file  Stress: Not on file  Social Connections: Not on file    Outpatient Encounter Medications as of 06/27/2020  Medication Sig  . [DISCONTINUED] carvedilol (COREG) 6.25 MG tablet Take 1 tablet (6.25 mg total) by mouth 2 (two) times daily with a meal.  . allopurinol (ZYLOPRIM) 100 MG tablet Take 1 tablet (100 mg total) by mouth daily.  Marland Kitchen amLODipine (NORVASC) 5 MG tablet Take 1 tablet (5 mg total) by mouth  daily.  Marland Kitchen aspirin EC 81 MG tablet Take 1 tablet (81 mg total) by mouth daily.  Marland Kitchen atorvastatin (LIPITOR) 80 MG tablet TAKE 1 TABLET (80 MG TOTAL) BY MOUTH EVERY MORNING.  . carvedilol (COREG) 6.25 MG tablet Take 1 tablet (6.25 mg total) by mouth 2 (two) times daily with a meal.  . LORazepam (ATIVAN) 1 MG tablet Takes 1/2 to 1 tablet q day prn  . losartan (COZAAR) 100 MG tablet Take 1 tablet (100 mg total) by mouth daily.  . [DISCONTINUED] carvedilol (COREG) 6.25 MG tablet Take 1 tablet (6.25 mg total) by mouth 2 (two) times daily with a meal.  . [DISCONTINUED] metoprolol succinate (TOPROL-XL) 25 MG 24 hr tablet TAKE 1 TABLET (25 MG TOTAL) BY MOUTH 2 (TWO) TIMES DAILY. TAKE WITH OR IMMEDIATELY FOLLOWING A MEAL.   No facility-administered encounter medications on file as of 06/27/2020.    Review of Systems  Constitutional: Negative for appetite change and unexpected weight change.  HENT: Negative for congestion and sinus pressure.   Respiratory: Negative for cough and chest tightness.        Breathing stable.   Cardiovascular: Negative for chest pain and palpitations.       Minimal ankle swelling as outlined.  Better in am.   Gastrointestinal: Negative for abdominal pain, diarrhea and nausea.  Genitourinary: Negative for difficulty urinating and dysuria.  Musculoskeletal: Negative for joint swelling and myalgias.  Skin: Negative for color change and rash.  Neurological: Negative for dizziness, light-headedness and headaches.  Psychiatric/Behavioral: Negative for agitation and dysphoric mood.       Objective:    Physical Exam Vitals reviewed.  Constitutional:      General: He is not in acute distress.    Appearance: Normal appearance. He is well-developed and well-nourished.  HENT:     Head: Normocephalic and atraumatic.     Right Ear: External ear normal.     Left Ear: External ear normal.  Eyes:     General: No scleral icterus.       Right eye: No discharge.        Left eye: No  discharge.     Conjunctiva/sclera: Conjunctivae normal.  Cardiovascular:     Rate and Rhythm: Normal rate and regular rhythm.  Pulmonary:     Effort: Pulmonary effort is normal. No respiratory distress.     Breath sounds: Normal breath sounds.  Abdominal:     General: Bowel sounds are normal.     Palpations: Abdomen is soft.     Tenderness: There is no abdominal tenderness.  Musculoskeletal:        General: No swelling, tenderness or edema.     Cervical back: Neck supple. No tenderness.  Lymphadenopathy:     Cervical: No cervical adenopathy.  Skin:    Findings: No erythema or rash.  Neurological:     Mental Status: He is alert.  Psychiatric:        Mood and Affect: Mood and affect and mood normal.        Behavior: Behavior normal.     BP 138/72   Pulse 73   Temp 97.9 F (36.6 C) (Oral)   Resp 16   Ht $R'5\' 10"'as$  (1.778 m)   Wt 239 lb (108.4 kg)   SpO2 98%   BMI 34.29 kg/m  Wt Readings from Last 3 Encounters:  06/27/20 239 lb (108.4 kg)  05/27/20 230 lb (104.3 kg)  04/23/20 232 lb (105.2 kg)     Lab Results  Component Value Date   WBC 9.6 05/23/2020   HGB 13.2 05/23/2020   HCT 39.4 05/23/2020   PLT 166.0 05/23/2020   GLUCOSE 177 (H) 06/03/2020   CHOL 100 05/23/2020   TRIG 44.0 05/23/2020   HDL 33.50 (L) 05/23/2020   LDLCALC 58 05/23/2020   ALT 13 05/23/2020   AST 21 05/23/2020   NA 139 06/03/2020   K 3.7 06/03/2020   CL 105 06/03/2020   CREATININE 1.31 06/03/2020   BUN 21 06/03/2020   CO2 24 06/03/2020   TSH 4.34 07/06/2019   PSA 4.15 (H) 02/08/2014   INR 1.7 (H) 01/25/2019   HGBA1C 6.4 05/23/2020   MICROALBUR 46.0 (H) 12/05/2019       Assessment & Plan:   Problem List Items Addressed This Visit    Hypertension    On losartan, amlodipine and metoprolol.  Given persistent elevation of blood pressure, will stop metoprolol and start coreg 12.$RemoveBefore'5mg'QaKSbhpKpdScr$  bid.  Follow pressures.  Has f/u planned with cardiology in several weeks.  Will follow pressures.  Keep f/u  with cardiology.  May need further adjustments in dosing - pending blood pressure.  Consider addition of entrestro.        Relevant Medications   carvedilol (COREG) 6.25 MG tablet   Hyperlipidemia LDL goal <70    On lipitor.  Low cholesterol diet and exercise.  Follow lipid panel and liver function tests.        Relevant Medications   carvedilol (COREG) 6.25 MG tablet   Diabetes (HCC)    Low carb diet and exercise.  On no medication.  Follow met b and a1c.       Chronic HFrEF (heart failure with reduced ejection fraction) (HCC)    Continue losartan.  Change metoprolol to coreg.  Keep follow up with cardiology.  No evidence of volume overload on exam.       Relevant Medications   carvedilol (COREG) 6.25 MG tablet   S/P aortic valve replacement    Recent ECHO - normal functioning bioprosthetic valve.  Continue losartan $RemoveBeforeDEI'100mg'zBnvtZKxnXBlNajE$  q day.  Change metoprolol to coreg as outlined.  Keep f/u with cardiology.        Pedal edema    Better in am.  Compression hose.  Limit salt intake.  Follow.        Dilatation of thoracic aorta ([place])    Followed by cardiology.  S/p surgery.        Relevant Medications   carvedilol (COREG) 6.25 MG tablet       Einar Pheasant, MD

## 2020-06-29 ENCOUNTER — Encounter: Payer: Self-pay | Admitting: Internal Medicine

## 2020-06-29 NOTE — Assessment & Plan Note (Signed)
Low carb diet and exercise.  On no medication.  Follow met b and a1c.  

## 2020-06-29 NOTE — Assessment & Plan Note (Signed)
Continue losartan.  Change metoprolol to coreg.  Keep follow up with cardiology.  No evidence of volume overload on exam.

## 2020-06-29 NOTE — Assessment & Plan Note (Signed)
Recent ECHO - normal functioning bioprosthetic valve.  Continue losartan 100mg  q day.  Change metoprolol to coreg as outlined.  Keep f/u with cardiology.

## 2020-06-29 NOTE — Assessment & Plan Note (Signed)
On losartan, amlodipine and metoprolol.  Given persistent elevation of blood pressure, will stop metoprolol and start coreg 12.5mg  bid.  Follow pressures.  Has f/u planned with cardiology in several weeks.  Will follow pressures.  Keep f/u with cardiology.  May need further adjustments in dosing - pending blood pressure.  Consider addition of entrestro.

## 2020-06-29 NOTE — Assessment & Plan Note (Signed)
Better in am.  Compression hose.  Limit salt intake.  Follow.

## 2020-06-29 NOTE — Assessment & Plan Note (Signed)
Followed by cardiology.  S/p surgery.

## 2020-06-29 NOTE — Assessment & Plan Note (Signed)
On lipitor.  Low cholesterol diet and exercise.  Follow lipid panel and liver function tests.   

## 2020-07-05 ENCOUNTER — Other Ambulatory Visit: Payer: Self-pay | Admitting: Internal Medicine

## 2020-07-15 ENCOUNTER — Other Ambulatory Visit: Payer: Self-pay | Admitting: Internal Medicine

## 2020-07-25 ENCOUNTER — Other Ambulatory Visit: Payer: Self-pay

## 2020-07-25 ENCOUNTER — Ambulatory Visit (INDEPENDENT_AMBULATORY_CARE_PROVIDER_SITE_OTHER): Payer: Medicare HMO

## 2020-07-25 DIAGNOSIS — Z952 Presence of prosthetic heart valve: Secondary | ICD-10-CM | POA: Diagnosis not present

## 2020-07-25 LAB — ECHOCARDIOGRAM COMPLETE
AR max vel: 2.04 cm2
AV Area VTI: 1.92 cm2
AV Area mean vel: 1.91 cm2
AV Mean grad: 8.5 mmHg
AV Peak grad: 15.8 mmHg
Ao pk vel: 1.99 m/s
Area-P 1/2: 4.1 cm2
S' Lateral: 3.2 cm
Single Plane A4C EF: 54.9 %

## 2020-07-25 MED ORDER — PERFLUTREN LIPID MICROSPHERE
1.0000 mL | INTRAVENOUS | Status: AC | PRN
  Administered 2020-07-25: 2 mL via INTRAVENOUS

## 2020-07-27 NOTE — Progress Notes (Deleted)
Error

## 2020-07-28 ENCOUNTER — Ambulatory Visit: Payer: Medicare HMO | Admitting: Physician Assistant

## 2020-07-28 ENCOUNTER — Encounter: Payer: Self-pay | Admitting: Physician Assistant

## 2020-07-28 ENCOUNTER — Other Ambulatory Visit: Payer: Self-pay

## 2020-07-28 VITALS — BP 132/78 | HR 64 | Ht 70.0 in | Wt 236.2 lb

## 2020-07-28 DIAGNOSIS — N1831 Chronic kidney disease, stage 3a: Secondary | ICD-10-CM | POA: Diagnosis not present

## 2020-07-28 DIAGNOSIS — Z951 Presence of aortocoronary bypass graft: Secondary | ICD-10-CM

## 2020-07-28 DIAGNOSIS — Z952 Presence of prosthetic heart valve: Secondary | ICD-10-CM | POA: Diagnosis not present

## 2020-07-28 DIAGNOSIS — I255 Ischemic cardiomyopathy: Secondary | ICD-10-CM

## 2020-07-28 DIAGNOSIS — I5022 Chronic systolic (congestive) heart failure: Secondary | ICD-10-CM

## 2020-07-28 DIAGNOSIS — I2510 Atherosclerotic heart disease of native coronary artery without angina pectoris: Secondary | ICD-10-CM | POA: Diagnosis not present

## 2020-07-28 DIAGNOSIS — E785 Hyperlipidemia, unspecified: Secondary | ICD-10-CM

## 2020-07-28 DIAGNOSIS — I10 Essential (primary) hypertension: Secondary | ICD-10-CM | POA: Diagnosis not present

## 2020-07-28 DIAGNOSIS — I350 Nonrheumatic aortic (valve) stenosis: Secondary | ICD-10-CM

## 2020-07-28 DIAGNOSIS — M7989 Other specified soft tissue disorders: Secondary | ICD-10-CM

## 2020-07-28 NOTE — Progress Notes (Signed)
Cardiology Office Note    Date:  07/28/2020   ID:  [name] [name], DOB [birth year], MRN [id number]  PCP:  Einar Pheasant, MD  Cardiologist:  Nelva Bush, MD  Electrophysiologist:  None   Chief Complaint: Follow up   History of Present Illness:   [name] [name] is a 87 y.o. male with history of multivessel CAD s/p 3-vessel CABG with LIMA-LAD, sequential SVG-rPDA and RPL in 11/2829,[id number] to ICM complicated bysevere aortic stenosis s/p bioprosthetic AVR in 01/2019 at the time of his cardiac bypass with subsequent normalization of LVSF by echo in 07/2020,CKD stage III,HTN,HLD, asthma, arthritis, gout, and BPH who presents forfollow up ofhis CAD, ICM, and aortic valve disease.  Hewas previous evaluated by [facility] cardiology in 2015 for murmur andunderwent nuclear stress test which was unrevealing per his report. Following that, hewas lost to follow up.Heinitially saw his PCP in6/2020,for a scheduled physical but reported he did not feel well. It was noted he was hypoxic with ambulation back to the exam room with documented O2 saturation of 86 to 89% on room air with some tightness in his throat and upper chest. In thissetting, he was sent to the hospital. CTA chest was negative for PE with prominence of the ascending aorta measuring 4.1 x 4.1 cm with aortic atherosclerosis as well as coronary artery calcification and evidence of pulmonary hypertension.Echo on 12/12/2018, showed severe hypokinesis of the entire anterior wall, anterolateral wall, and apical segment with dyskinesis of the basal inferior wall, EF 30 to 35%, Doppler parameters consistent with restrictive filling with elevated mean left atrial pressure, unable to exclude LV apical thrombus, large pleural effusion in the left lateral region, degenerative mitral valve with moderate mitral regurgitation, indeterminate number of aortic valve cusps with severe aortic stenosis with a mean gradient of 42 mmHg and a  valve area of 0.58 cm, moderate aortic valve insufficiency, and low normal RV systolic function, RV systolic pressure at least 65 to 70 mmHg. He preferred to undergo workup as an outpatient as he wanted to be home to help with his wife. Hesubsequentlyunderwent R/LHC on 01/02/2019 that showed significant two-vessel CAD, including 80% ostial LAD stenosis and 80-90% stenosis of large RCA continuation, mild to moderate disease noted in the proximal/mid LAD, RCA, and ramus, mildly elevated right heart filling pressures, moderately elevated left heart filling pressures, severe pulmonary hypertension, moderately reduced CO/CI. He was referred to CVTS for consultation of CABG given two-vessel CAD, with the LAD not being ideal for PCI, and in the setting of severe aortic stenosis.Initially, he cancelled his appointment with cardiothoracic surgery. He was ultimately evaluated by them and underwent 3-vessel CABG and bioprosthetic AVR in 01/2019. Postoperative echo in 07/2019 showed a mildly improved LVSF with an EF of 40 to 69%, grade 1 diastolic dysfunction, severe hypokinesis noted in the mid to apical anterolateral wall, low normal RV systolic function with normal RV cavity size, mildly dilated left atrium, trivial mitral regurgitation, and a normal functioning bioprosthetic aortic valve without rocking or dehiscence noted with a mean gradient of 7 mmHg. More recently, his amlodipine was tapered due to lower extremity swelling and losartan was held secondary to elevated SCr. He was seen in the office in 09/2019 and was feeling well. He did note his BP continued to run on the high side, though he did not trust the accuracy of his wrist cuff. He remained on amlodipine and triamterene/HCTZ. BP at that visit was 158/82.  He was last seen in the office in 04/2020 and  was doing well, with losartan being titrated to 100 mg.  He underwent follow up echo on 07/25/2020 which showed a now normal LVSF with an EF of 60-65%, no  RWMA, Gr2DD, moderate LVH, normal RVSF and ventricular cavity size, moderately dilated left atrium, mild mitral regurgitation, bioprosthetic aortic valve with no regurgitation or stenosis with a mean gradient of 8.5 mmHg (prior 7 mmHg), and borderline dilatation of the aortic root measuring 37 mm.   He comes in doing well from a cardiac perspective. No chest pain, dyspnea, palpitations, dizziness, presyncope, or syncope. He has noted right lower extremity swelling that dates back to his bypass surgery. This typically improves by the morning time after he has been laying supine overnight. He does try and elevate his legs when sitting and has intermittently worn compression stocking with some improvement in symptoms. No erythema or warmth noted along the right lower extremity. He does not check his BP at home. He is tolerating all medications without issues. Otherwise, he does not have any concerns at this time.   Labs independently reviewed: 05/2020 - potassium 3.7, BUN 21, SCr 1.31, HGB 13.2, PLT 166, A1c 6.4, albumin 4.5, AST/ALT normal, TC 100, TG 44, HDL 33, LDL 58 09/2019-magnesium 2.1 06/2019-TSH normal    Past Medical History:  Diagnosis Date  . Aortic stenosis   . Arthritis    right shoulder  . BPH (benign prostatic hypertrophy)   . Coronary artery disease   . Gout   . Heart murmur   . History of chicken pox   . Hypercholesterolemia   . Hypertension   . Systolic heart failure (HCC)    ischemic cardiomyopathy  . Wears dentures    full upper and lower    Past Surgical History:  Procedure Laterality Date  . AORTIC VALVE REPLACEMENT N/A 01/25/2019   Procedure: AORTIC VALVE REPLACEMENT (AVR) 25mm Inspiris valve;  Surgeon: Gaye Pollack, MD;  Location: [place] [facility];  Service: Open Heart Surgery;  Laterality: N/A;  . arm surgery     right arm fx s/p "plate insertion"  . CARDIAC CATHETERIZATION    . CATARACT EXTRACTION W/PHACO Right 11/17/2016   Procedure: CATARACT EXTRACTION PHACO  AND INTRAOCULAR LENS PLACEMENT ([place])  Right;  Surgeon: Leandrew Koyanagi, MD;  Location: [place];  Service: Ophthalmology;  Laterality: Right;  . CORONARY ARTERY BYPASS GRAFT N/A 01/25/2019   LIMA-LAD and sequential SVG-rPDA-rPL  . DENTAL SURGERY     all teeth extracted  . RIGHT HEART CATH AND CORONARY ANGIOGRAPHY N/A 01/02/2019   Procedure: RIGHT HEART CATH AND CORONARY ANGIOGRAPHY;  Surgeon: Nelva Bush, MD;  Location: [place] [facility];  Service: Cardiovascular;  Laterality: N/A;  . TEE WITHOUT CARDIOVERSION N/A 01/25/2019   Procedure: TRANSESOPHAGEAL ECHOCARDIOGRAM (TEE);  Surgeon: Gaye Pollack, MD;  Location: [place];  Service: Open Heart Surgery;  Laterality: N/A;    Current Medications: Current Meds  Medication Sig  . allopurinol (ZYLOPRIM) 100 MG tablet Take 1 tablet (100 mg total) by mouth daily.  Marland Kitchen amLODipine (NORVASC) 5 MG tablet Take 1 tablet (5 mg total) by mouth daily.  Marland Kitchen aspirin EC 81 MG tablet Take 1 tablet (81 mg total) by mouth daily.  Marland Kitchen atorvastatin (LIPITOR) 80 MG tablet TAKE 1 TABLET EVERY MORNING  . carvedilol (COREG) 6.25 MG tablet TAKE 1 TABLET BY MOUTH 2 TIMES DAILY WITH A MEAL.  Marland Kitchen LORazepam (ATIVAN) 1 MG tablet Takes 1/2 to 1 tablet q day prn  . losartan (COZAAR) 100 MG tablet Take  1 tablet (100 mg total) by mouth daily.    Allergies:   Patient has no known allergies.   Social History   Socioeconomic History  . Marital status: Married    Spouse name: Not on file  . Number of children: Not on file  . Years of education: Not on file  . Highest education level: Not on file  Occupational History  . Not on file  Tobacco Use  . Smoking status: Never Smoker  . Smokeless tobacco: Current User    Types: Chew  . Tobacco comment: not ready to quit currently  Vaping Use  . Vaping Use: Never used  Substance and Sexual Activity  . Alcohol use: No    Alcohol/week: 0.0 standard drinks  . Drug use: No  . Sexual activity: Not Currently  Other  Topics Concern  . Not on file  Social History Narrative  . Not on file   Social Determinants of Health   Financial Resource Strain: Not on file  Food Insecurity: Not on file  Transportation Needs: Not on file  Physical Activity: Not on file  Stress: Not on file  Social Connections: Not on file     Family History:  The patient's family history includes Cancer in his daughter; Congestive Heart Failure in his mother; Leukemia in his father. There is no history of Prostate cancer or Colon cancer.  ROS:   Review of Systems  Constitutional: Negative for chills, diaphoresis, fever, malaise/fatigue and weight loss.  HENT: Negative for congestion.   Eyes: Negative for discharge and redness.  Respiratory: Negative for cough, sputum production, shortness of breath and wheezing.   Cardiovascular: Positive for leg swelling. Negative for chest pain, palpitations, orthopnea, claudication and PND.  Gastrointestinal: Negative for abdominal pain, heartburn, nausea and vomiting.  Musculoskeletal: Negative for falls and myalgias.  Skin: Negative for rash.  Neurological: Negative for dizziness, tingling, tremors, sensory change, speech change, focal weakness, loss of consciousness and weakness.  Endo/Heme/Allergies: Does not bruise/bleed easily.  Psychiatric/Behavioral: Negative for substance abuse. The patient is not nervous/anxious.   All other systems reviewed and are negative.    EKGs/Labs/Other Studies Reviewed:    Studies reviewed were summarized above. The additional studies were reviewed today:  07/25/2020: 1. Left ventricular ejection fraction, by estimation, is 60 to 65%. The  left ventricle has normal function. The left ventricle has no regional  wall motion abnormalities. There is moderate left ventricular hypertrophy.  Left ventricular diastolic  parameters are consistent with Grade II diastolic dysfunction  (pseudonormalization).  2. Right ventricular systolic function is  normal. The right ventricular  size is normal.  3. Left atrial size was moderately dilated.  4. The mitral valve is normal in structure. Mild mitral valve  regurgitation.  5. Aortic valve regurgitation is not visualized. No aortic stenosis is  present. There is a 25 mm Edwards bovine valve present in the aortic  position. Procedure Date: 01/25/19. Aortic valve area, by VTI measures 1.92  cm. Aortic valve mean gradient  measures 8.5 mmHg.  6. There is borderline dilatation of the aortic root, measuring 37 mm. __________  2D echo 07/2019: 1. Left ventricular ejection fraction, by visual estimation, is 40 to  45%. The left ventricle has mild to moderately decreased function. There  is no left ventricular hypertrophy.  2. Left ventricular diastolic parameters are consistent with Grade I  diastolic dysfunction (impaired relaxation).  3. The left ventricle demonstrates regional wall motion abnormalities.  4. Severe hypokinesis is noted in the Mid  to apical anterolateral wall of  the left ventricle (clips 63, 64).  5. Global right ventricle has low normal systolic function.The right  ventricular size is normal. Right vetricular wall thickness was not  assessed.  6. Left atrial size was mildly dilated.  7. Right atrial size was normal.  8. The mitral valve is grossly normal. Trivial mitral valve  regurgitation.  9. The tricuspid valve is normal in structure.  10. The tricuspid valve is normal in structure. Tricuspid valve  regurgitation is not demonstrated.  11. Aortic valve mean gradient measures 7.0 mmHg.  12. Aortic valve peak gradient measures 13.8 mmHg.  13. Aortic valve regurgitation is not visualized.  14. There is a normal functioning bioprosthetic aortic valve present. No  rocking or valve dehiscence is noted.  15. The pulmonic valve was not well visualized. Pulmonic valve  regurgitation is not visualized. __________  Intraoperative TEE 01/2019: 1. Left  ventricular ejection fraction, by visual estimation, is 40 to  45%. The left ventricle has mild to moderately decreased function. There  is no left ventricular hypertrophy.  2. Left ventricular diastolic parameters are consistent with Grade I  diastolic dysfunction (impaired relaxation).  3. The left ventricle demonstrates regional wall motion abnormalities.  4. Severe hypokinesis is noted in the Mid to apical anterolateral wall of  the left ventricle (clips 63, 64).  5. Global right ventricle has low normal systolic function.The right  ventricular size is normal. Right vetricular wall thickness was not  assessed.  6. Left atrial size was mildly dilated.  7. Right atrial size was normal.  8. The mitral valve is grossly normal. Trivial mitral valve  regurgitation.  9. The tricuspid valve is normal in structure.  10. The tricuspid valve is normal in structure. Tricuspid valve  regurgitation is not demonstrated.  11. Aortic valve mean gradient measures 7.0 mmHg.  12. Aortic valve peak gradient measures 13.8 mmHg.  13. Aortic valve regurgitation is not visualized.  14. There is a normal functioning bioprosthetic aortic valve present. No  rocking or valve dehiscence is noted.  15. The pulmonic valve was not well visualized. Pulmonic valve  regurgitation is not visualized. __________  Pre-CABG carotid/upper and lower extremity ABIs: Summary:  Right Carotid: Velocities in the right ICA are consistent with a 1-39%  stenosis.   Left Carotid: Velocities in the left ICA are consistent with a 1-39%  stenosis.  Vertebrals: Bilateral vertebral arteries demonstrate antegrade flow.  Subclavians: Normal flow hemodynamics were seen in bilateral subclavian        arteries.   Right ABI: Resting right ankle-brachial index indicates noncompressible  right lower extremity arteries.The right toe-brachial index is normal.  ABIs are unreliable.  Left ABI: Resting left ankle-brachial  index indicates noncompressible left  lower extremity arteries.The left toe-brachial index is normal. ABIs are  unreliable.  Right Upper Extremity: Doppler waveforms remain within normal limits with  right radial compression. Doppler waveforms remain within normal limits  with right ulnar compression.  Left Upper Extremity: Doppler waveforms remain within normal limits with  left radial compression. Doppler waveforms remain within normal limits  with left ulnar compression. __________  [facility] 12/2018: Conclusions: 1. Significant two-vessel coronary artery disease, including 80% ostial LAD stenosis and 80-90% stenosis of large RCA continuation. 2. Mild to moderate disease noted the proximal/mid LAD, RCA, and ramus intermedius. 3. Mildly elevated right heart filling pressures. 4. Moderately elevated left heart filling pressures. 5. Severe pulmonary hypertension. 6. Moderately reduced Fick cardiac output/index.  Recommendations: 1. Outpatient  cardiac surgery consultation, given significant two-vessel coronary artery disease (LAD stenosis not ideal for PCI) and severe aortic stenosis. 2. Continue current dose of torsemide; will plan for BMP early next week and consider escalation of torsemide +/- addition of low-dose ACEI/ARB. 3. Continue current dose of metoprolol. 4. Aggressive secondary prevention of coronary artery disease. 5. Follow-up in the office in two weeks; may need to consider referral to advanced heart failure clinic in [place] if the patient has recurrent heart failure symptoms or he does not tolerate escalation of diuresis/evidence-based heart failure therapy. __________  Limited 2D echo 11/2018: 1. The left ventricle has moderately reduced systolic function, with an  ejection fraction of 35-40%. There is mildly increased left ventricular  wall thickness. Left ventricular diastolic Doppler parameters are  consistent with impaired relaxation.  2. The mitral valve was  not well visualized. Mitral valve regurgitation  is moderate by color flow Doppler.  3. The tricuspid valve was not well visualized. Tricuspid valve  regurgitation is moderate.  4. The aortic valve was not well visualized Moderate thickening of the  aortic valve Moderate calcification of the aortic valve. Aortic valve  regurgitation is mild to moderate by color flow Doppler. __________  2D echo 11/2018: 1. Severe hypokinesis of the left ventricular, entire anterior wall,  anterolateral wall and apical segment.  2. There is dyskinesis of the left ventricular, basal inferior wall.  3. The left ventricle has moderate-severely reduced systolic function,  with an ejection fraction of 30-35%. The cavity size was normal. There is  mildly increased left ventricular wall thickness. Left ventricular  diastolic Doppler parameters are  consistent with restrictive filling. Elevated mean left atrial pressure.  4. Left ventricular apical thrombus cannot be excluded. Consider limited  echo with Def[facility] or cardiac MRI for further evaluation, as clinically  indicated.  5. Large pleural effusion in the left lateral region.  6. The mitral valve is degenerative. Mild thickening of the mitral valve  leaflet. Mitral valve regurgitation is moderate by color flow Doppler.  7. The tricuspid valve is grossly normal.  8. The aortic valve has an indeterminate number of cusps. Severely  thickening of the aortic valve. Moderate calcification of the aortic  valve. Aortic valve regurgitation is moderate by color flow Doppler.  Severe stenosis of the aortic valve.  9. The interatrial septum was not well visualized.   EKG:  EKG is ordered today.  The EKG ordered today demonstrates NSR with sinus arrhythmia, 64 bpm, left axis deviation, poor R wave progression along the precordial leads, nonspecific IVCD, nonspecific ST-T changes, when compared to prior tracings no significant changes  Recent  Labs: 10/19/2019: Magnesium 2.1 05/23/2020: ALT 13; Hemoglobin 13.2; Platelets 166.0 06/03/2020: BUN 21; Creatinine, Ser 1.31; Potassium 3.7; Sodium 139  Recent Lipid Panel    Component Value Date/Time   CHOL 100 05/23/2020 0831   TRIG 44.0 05/23/2020 0831   HDL 33.50 (L) 05/23/2020 0831   CHOLHDL 3 05/23/2020 0831   VLDL 8.8 05/23/2020 0831   LDLCALC 58 05/23/2020 0831    PHYSICAL EXAM:    VS:  BP 132/78   Pulse 64   Ht 5\' 10"  (1.778 m)   Wt 236 lb 4 oz (107.2 kg)   SpO2 98%   BMI 33.90 kg/m   BMI: Body mass index is 33.9 kg/m.  Physical Exam Vitals reviewed.  Constitutional:      Appearance: He is well-developed and well-nourished.  HENT:     Head: Normocephalic and atraumatic.  Eyes:  General:        Right eye: No discharge.        Left eye: No discharge.  Neck:     Vascular: No JVD.  Cardiovascular:     Rate and Rhythm: Normal rate and regular rhythm.     Pulses: No midsystolic click and no opening snap.          Posterior tibial pulses are 2+ on the right side and 2+ on the left side.     Heart sounds: S1 normal and S2 normal. Heart sounds not distant. Murmur heard.   Systolic murmur is present with a grade of 1/6 at the upper right sternal border. No friction rub.  Pulmonary:     Effort: Pulmonary effort is normal. No respiratory distress.     Breath sounds: Normal breath sounds. No decreased breath sounds, wheezing or rales.  Chest:     Chest wall: No tenderness.  Abdominal:     General: There is no distension.     Palpations: Abdomen is soft.     Tenderness: There is no abdominal tenderness.  Musculoskeletal:     Cervical back: Normal range of motion.     Right lower leg: Edema present.     Comments: Trace bilateral pretibial lower extremity nonpitting swelling. No erythema, warmth, or cording.  Skin:    General: Skin is warm and dry.     Nails: There is no clubbing or cyanosis.  Neurological:     Mental Status: He is alert and oriented to  person, place, and time.  Psychiatric:        Mood and Affect: Mood and affect normal.        Speech: Speech normal.        Behavior: Behavior normal.        Thought Content: Thought content normal.        Judgment: Judgment normal.     Wt Readings from Last 3 Encounters:  07/28/20 236 lb 4 oz (107.2 kg)  06/27/20 239 lb (108.4 kg)  05/27/20 230 lb (104.3 kg)     ASSESSMENT & PLAN:   1. CAD s/p CABG without angina: He is doing well without any symptoms concerning for angina.  Continue secondary prevention and current medical therapy including aspirin, Toprol-XL, losartan, and atorvastatin.  No indication for ischemic evaluation at this time.  2. HFrEF secondary to ICM: He appears euvolemic and well compensated.  He has NYHA class II symptoms.  Echo from 07/25/2020 showed normalization of his EF. Given normalization of LV systolic function we will continue current medical therapy including carvedilol and losartan and defer transition to [facility]. Not currently on MRA secondary to underlying CKD. CHF education.  3. Aortic stenosis s/p bioprosthetic AVR in 01/2019: Normal functioning bioprosthetic AVR by echo on 07/25/2020 with a mean gradient of 8.5 mmHg.  At baseline he does not have dentures, therefore SBE prophylaxis is not indicated at this time as he does not visit the dentist.  He remains on ASA.  4. Dilated aortic root: Borderline dilatation by echo 07/2020.  Monitor with periodic echo in 12 months.  5. HTN: Blood pressure is suboptimally controlled at triage after walking back to his room. Recheck BP improved to 132/78. Suspect there is some degree of whitecoat hypertension. Continue current medical therapy including amlodipine, carvedilol, and losartan. Down the road, could consider transitioning losartan to irbesartan pending medication recall status at that time and subsequent BPs.  6. HLD: LDL 58 and triglycerides of 44 from  05/2020 with normal LFT at that time.  He remains on  atorvastatin 80 mg.  7. CKD stage III: Renal function improved and at his approximate baseline on most recent check in 05/2020.  8. Right lower extremity swelling: Likely secondary to venous insufficiency following saphenous vein graft harvest in the setting of his bypass. We will obtain a right lower extremity ultrasound to exclude DVT. Though suspect this will be negative. Recommend leg elevation and compression stocking.   Disposition: F/u with Dr. Saunders Revel or an APP in 6 months, sooner if needed.   Medication Adjustments/Labs and Tests Ordered: Current medicines are reviewed at length with the patient today.  Concerns regarding medicines are outlined above. Medication changes, Labs and Tests ordered today are summarized above and listed in the Patient Instructions accessible in Encounters.   Signed, Christell Faith, PA-C 07/28/2020 12:12 PM     [address] [employer] [place], [place] [address] [phone]

## 2020-07-28 NOTE — Patient Instructions (Addendum)
Medication Instructions:  No changes  *If you need a refill on your cardiac medications before your next appointment, please call your pharmacy*   Lab Work: None  If you have labs (blood work) drawn today and your tests are completely normal, you will receive your results only by: Marland Kitchen MyChart Message (if you have MyChart) OR . A paper copy in the mail If you have any lab test that is abnormal or we need to change your treatment, we will call you to review the results.   Testing/Procedures: Right lower extremity ultrasound to rule out DVT due to swelling.    Follow-Up: At [facility], you and your health needs are our priority.  As part of our continuing mission to provide you with exceptional heart care, we have created designated Provider Care Teams.  These Care Teams include your primary Cardiologist (physician) and Advanced Practice Providers (APPs -  Physician Assistants and Nurse Practitioners) who all work together to provide you with the care you need, when you need it.  We recommend signing up for the patient portal called "MyChart".  Sign up information is provided on this After Visit Summary.  MyChart is used to connect with patients for Virtual Visits (Telemedicine).  Patients are able to view lab/test results, encounter notes, upcoming appointments, etc.  Non-urgent messages can be sent to your provider as well.   To learn more about what you can do with MyChart, go to [email or web address].    Your next appointment:   6 month(s)  The format for your next appointment:   In Person  Provider:   Nelva Bush, MD or Christell Faith, PA-C

## 2020-07-29 ENCOUNTER — Ambulatory Visit (INDEPENDENT_AMBULATORY_CARE_PROVIDER_SITE_OTHER): Payer: Medicare HMO

## 2020-07-29 DIAGNOSIS — M7989 Other specified soft tissue disorders: Secondary | ICD-10-CM

## 2020-07-31 ENCOUNTER — Telehealth: Payer: Self-pay

## 2020-07-31 NOTE — Telephone Encounter (Signed)
Able to reach pt regarding his recent vascular US, Christell Faith, PA-C had a chance to review his results and advised "No evidence of DVT. Swelling is related to saphenous vein harvest for his CABG. Continue to recommend leg elevation and compression stocking". Pt is delighted of the good reports, verbalized understanding of leg elevation while at rest and to utilize compression hose/stockes for swelling/edema to legs, otherwise all questions or concerns were address and no additional concerns at this time. Agreeable to plan, will call back for anything further.

## 2020-08-25 ENCOUNTER — Telehealth: Payer: Self-pay | Admitting: Internal Medicine

## 2020-08-25 NOTE — Telephone Encounter (Signed)
Patient's wife called stated that patient saud he does not feel well that is all she said he has an appointment with Dr.Scott on 3-14

## 2020-08-25 NOTE — Telephone Encounter (Signed)
If he is short of breath and not feeling well, agree with need for ER evaluation.   Let them know that I am seeing pts now, but I do feel he needs to be seen.  If I need to call him, let me know.

## 2020-08-25 NOTE — Telephone Encounter (Signed)
You will need to call patient. I have already tried all the other options.

## 2020-08-25 NOTE — Telephone Encounter (Signed)
Spoken to the wife, she stated patient is telling her that he doesn't feel good and he is acting just like he did prior when he needed heart surgery. He is having a little SOB but he is in no pain per wife, and he wont fully tell her what is wrong. [name] [name] would like Dr Nicki Reaper to call and tell him to go to ED. He will only listen to Dr Nicki Reaper, no one else. Patients wife stated that Dr Nicki Reaper knows how he is and will call her. Patient has appointment next week already. Please advise.

## 2020-08-25 NOTE — Telephone Encounter (Signed)
Late entry.  Called and spoke to [name] [name].  He denies any fever, congestion, cough, sob or chest pain.  States he has been working.  Felt like he may have pulled a muscle - posterior back - under left shoulder blade.  Applied heating pad.  Now has rash/blisters.  He felt was related to using the heating pad.  Using abx cream topically.  Discussed possible shingles.  He does not feel needs evaluation today.  Denies chest pain.  Please call him for update and see how he is doing.

## 2020-08-26 NOTE — Telephone Encounter (Signed)
LMTCB

## 2020-08-27 NOTE — Telephone Encounter (Signed)
Patient confirmed that he is doing ok and will see [place] on Monday.

## 2020-08-28 DIAGNOSIS — H2512 Age-related nuclear cataract, left eye: Secondary | ICD-10-CM | POA: Diagnosis not present

## 2020-08-28 DIAGNOSIS — Z0100 Encounter for examination of eyes and vision without abnormal findings: Secondary | ICD-10-CM | POA: Diagnosis not present

## 2020-08-29 DIAGNOSIS — B029 Zoster without complications: Secondary | ICD-10-CM | POA: Diagnosis not present

## 2020-08-30 NOTE — Telephone Encounter (Signed)
If he does not feel needs to be seen, since diagnosed and being treated and he is doing well otherwise - ok to hold on appt.

## 2020-08-30 NOTE — Telephone Encounter (Signed)
I will defer this decision to the patients PCP.

## 2020-08-30 NOTE — Telephone Encounter (Signed)
Pt called access nurse this morning.  Caller states he has an appt on Monday but was recently dx with shingles yesterday and started on Valacyclovir. Pt wants to know if he should still go to his appt. Pt sates rash is down entire Left arm. No there s/sx's, no fever, no pain.  Disposition: See PCP within 24 hours (Leah Reason, RN)  Please advise

## 2020-09-01 ENCOUNTER — Ambulatory Visit: Payer: Medicare HMO | Admitting: Internal Medicine

## 2020-09-01 ENCOUNTER — Telehealth: Payer: Self-pay | Admitting: Internal Medicine

## 2020-09-01 NOTE — Telephone Encounter (Signed)
Rescheduled appt for patient

## 2020-09-01 NOTE — Telephone Encounter (Signed)
Appt rescheduled

## 2020-09-01 NOTE — Telephone Encounter (Signed)
Patient called and said he has shingles. His dermatologist gave him medication for the pain. Patient wanted to know if Dr. Nicki Reaper still wanted to do a telephone call instead of coming into office or should he reschedule appointment.

## 2020-09-29 DIAGNOSIS — S4992XA Unspecified injury of left shoulder and upper arm, initial encounter: Secondary | ICD-10-CM | POA: Diagnosis not present

## 2020-09-29 DIAGNOSIS — M19012 Primary osteoarthritis, left shoulder: Secondary | ICD-10-CM | POA: Diagnosis not present

## 2020-09-29 DIAGNOSIS — M12812 Other specific arthropathies, not elsewhere classified, left shoulder: Secondary | ICD-10-CM | POA: Diagnosis not present

## 2020-10-08 ENCOUNTER — Encounter: Payer: Self-pay | Admitting: Internal Medicine

## 2020-10-08 ENCOUNTER — Telehealth (INDEPENDENT_AMBULATORY_CARE_PROVIDER_SITE_OTHER): Payer: Medicare HMO | Admitting: Internal Medicine

## 2020-10-08 DIAGNOSIS — I10 Essential (primary) hypertension: Secondary | ICD-10-CM

## 2020-10-08 DIAGNOSIS — I77810 Thoracic aortic ectasia: Secondary | ICD-10-CM | POA: Diagnosis not present

## 2020-10-08 DIAGNOSIS — I2510 Atherosclerotic heart disease of native coronary artery without angina pectoris: Secondary | ICD-10-CM | POA: Diagnosis not present

## 2020-10-08 DIAGNOSIS — E1159 Type 2 diabetes mellitus with other circulatory complications: Secondary | ICD-10-CM | POA: Diagnosis not present

## 2020-10-08 DIAGNOSIS — Z952 Presence of prosthetic heart valve: Secondary | ICD-10-CM | POA: Diagnosis not present

## 2020-10-08 DIAGNOSIS — M25512 Pain in left shoulder: Secondary | ICD-10-CM

## 2020-10-08 DIAGNOSIS — B029 Zoster without complications: Secondary | ICD-10-CM | POA: Diagnosis not present

## 2020-10-08 DIAGNOSIS — I5022 Chronic systolic (congestive) heart failure: Secondary | ICD-10-CM

## 2020-10-08 DIAGNOSIS — N1831 Chronic kidney disease, stage 3a: Secondary | ICD-10-CM | POA: Diagnosis not present

## 2020-10-08 DIAGNOSIS — E785 Hyperlipidemia, unspecified: Secondary | ICD-10-CM | POA: Diagnosis not present

## 2020-10-08 MED ORDER — LORAZEPAM 1 MG PO TABS: ORAL_TABLET | ORAL | 0 refills | Status: DC

## 2020-10-08 NOTE — Progress Notes (Signed)
Patient ID: [name] [name], male   DOB: [birth year], 87 y.o.   MRN: [id number]   Virtual Visit via telephone Note  This visit type was conducted due to national recommendations for restrictions regarding the COVID-19 pandemic (e.g. social distancing).  This format is felt to be most appropriate for this patient at this time.  All issues noted in this document were discussed and addressed.  No physical exam was performed (except for noted visual exam findings with Video Visits).   I connected with [name] [name] by telephone and verified that I am speaking with the correct person using two identifiers. Location patient: home Location provider: home office Persons participating in the telephone visit: patient, provider  The limitations, risks, security and privacy concerns of performing an evaluation and management service by telephone and the availability of in person appointments have been discussed.  It has also been discussed with the patient that there may be a patient responsible charge related to this service. The patient expressed understanding and agreed to proceed.   Reason for visit: scheduled follow up.   HPI: Recently diagnosed with shingles.  Treated.  Lesions have healed.  No significant residual problems from shingles.  Using a topical shingles skin care.  Having left shoulder pain. Was previously using a long handle rake and felt a pulling discomfort in his left shoulder.  Used heating pad, etc.  Persistent pain.  Saw [name] [name] recently.  Has been referred to see [name] [name].  Describes persistent left shoulder pain and inability to lift his arm - without increased pain.  Has appt with [name] [name] 5/16.  States he is otherwise doing well. Tries to stay active.  No chest pain.  Breathing stable.  Allegra controls allergies.  No cough or congestion.  Took a stool softener and noticed some loose stool after.  No significant diarrhea.  Request refill lorazepam.  Rarely takes, but had to take  when had shingles.     ROS: See pertinent positives and negatives per HPI.  Past Medical History:  Diagnosis Date  . Aortic stenosis   . Arthritis    right shoulder  . BPH (benign prostatic hypertrophy)   . Coronary artery disease   . Gout   . Heart murmur   . History of chicken pox   . Hypercholesterolemia   . Hypertension   . Systolic heart failure (HCC)    ischemic cardiomyopathy  . Wears dentures    full upper and lower    Past Surgical History:  Procedure Laterality Date  . AORTIC VALVE REPLACEMENT N/A 01/25/2019   Procedure: AORTIC VALVE REPLACEMENT (AVR) 59mm Inspiris valve;  Surgeon: [name] Borne, [name] [name];  Location: [facility] OR;  Service: Open Heart Surgery;  Laterality: N/A;  . arm surgery     right arm fx s/p "plate insertion"  . CARDIAC CATHETERIZATION    . CATARACT EXTRACTION W/PHACO Right 11/17/2016   Procedure: CATARACT EXTRACTION PHACO AND INTRAOCULAR LENS PLACEMENT ([facility])  Right;  Surgeon: [name] Mola, [name] [name];  Location: [facility];  Service: Ophthalmology;  Laterality: Right;  . CORONARY ARTERY BYPASS GRAFT N/A 01/25/2019   LIMA-LAD and sequential SVG-rPDA-rPL  . DENTAL SURGERY     all teeth extracted  . RIGHT HEART CATH AND CORONARY ANGIOGRAPHY N/A 01/02/2019   Procedure: RIGHT HEART CATH AND CORONARY ANGIOGRAPHY;  Surgeon: [name] Kendall, [name] [name];  Location: [facility] INVASIVE CV [facility];  Service: Cardiovascular;  Laterality: N/A;  . TEE WITHOUT CARDIOVERSION N/A 01/25/2019   Procedure: TRANSESOPHAGEAL ECHOCARDIOGRAM (TEE);  Surgeon: [name] Pollack, [name] [name];  Location: [place];  Service: Open Heart Surgery;  Laterality: N/A;    Family History  Problem Relation Age of Onset  . Leukemia Father   . Congestive Heart Failure Mother   . Cancer Daughter        Breast Cancer  . Prostate cancer Neg Hx   . Colon cancer Neg Hx     SOCIAL HX: reviewed.    Current Outpatient Medications:  .  allopurinol (ZYLOPRIM) 100 MG tablet, Take 1 tablet (100 mg total) by mouth daily.,  Disp: 90 tablet, Rfl: 3 .  amLODipine (NORVASC) 5 MG tablet, Take 1 tablet (5 mg total) by mouth daily., Disp: 90 tablet, Rfl: 3 .  aspirin EC 81 MG tablet, Take 1 tablet (81 mg total) by mouth daily., Disp: 90 tablet, Rfl: 0 .  atorvastatin (LIPITOR) 80 MG tablet, TAKE 1 TABLET EVERY MORNING, Disp: 90 tablet, Rfl: 0 .  carvedilol (COREG) 6.25 MG tablet, TAKE 1 TABLET BY MOUTH 2 TIMES DAILY WITH A MEAL., Disp: 30 tablet, Rfl: 1 .  LORazepam (ATIVAN) 1 MG tablet, Takes 1/2 to 1 tablet q day prn, Disp: 30 tablet, Rfl: 0 .  losartan (COZAAR) 100 MG tablet, Take 1 tablet (100 mg total) by mouth daily., Disp: 90 tablet, Rfl: 3  EXAM:  GENERAL: alert. Sounds to be in no acute distress.  Answering questions appropriately.   PSYCH/NEURO: pleasant and cooperative, no obvious depression or anxiety, speech and thought processing grossly intact  ASSESSMENT AND PLAN:  Discussed the following assessment and plan:  Problem List Items Addressed This Visit    Chronic HFrEF (heart failure with reduced ejection fraction) (HCC)    Continue losartan and coreg.  No sob.  No concern regarding volume overload.  Follow.        CKD (chronic kidney disease) stage 3, GFR 30-59 ml/min (HCC)    Avoid antiinflammatories.  Follow metabolic panel.       Coronary artery disease involving native coronary artery of native heart without angina pectoris    S/p CABG and AVR.  Continue risk factor modification.        Diabetes ([place])    On no medication.  Low carb diet and exercise.  Follow met b and a1c.   Lab Results  Component Value Date   HGBA1C 6.4 05/23/2020        Relevant Orders   Hemoglobin Z6X   Basic metabolic panel   Dilatation of thoracic aorta (HCC)    S/p surgery.  Continues to be followed by cardiology - monitoring.       Hyperlipidemia LDL goal <70    Continue lipitor.  Low cholesterol diet and exercise.  Follow lipid panel and liver function tests.       Relevant Orders   Lipid panel    Hepatic function panel   Hypertension    Continue losartan, amlodipine and coreg.  Just evaluated by cardiology.  No changes made.  Follow pressures.  Follow metabolic panel.       Relevant Orders   TSH   S/P aortic valve replacement    Last echo - normal functioning bioprosthetic valve.  Continue losartan and coreg.  Continue f/u with cardiology.       Shingles    Recent diagnosis of shingles. Treated.  Doing well.  Follow.        Shoulder pain, left    Persistent pain and limited rom.  Has seen Vance Peper.  Due to see  [name] Roland Rack for further evaluation.            I discussed the assessment and treatment plan with the patient. The patient was provided an opportunity to ask questions and all were answered. The patient agreed with the plan and demonstrated an understanding of the instructions.   The patient was advised to call back or seek an in-person evaluation if the symptoms worsen or if the condition fails to improve as anticipated.  I provided 22 minutes of non-face-to-face time during this encounter.   Einar Pheasant, [name] [name]

## 2020-10-12 ENCOUNTER — Encounter: Payer: Self-pay | Admitting: Internal Medicine

## 2020-10-12 DIAGNOSIS — B029 Zoster without complications: Secondary | ICD-10-CM | POA: Insufficient documentation

## 2020-10-12 DIAGNOSIS — M25512 Pain in left shoulder: Secondary | ICD-10-CM | POA: Insufficient documentation

## 2020-10-12 NOTE — Assessment & Plan Note (Signed)
On no medication.  Low carb diet and exercise.  Follow met b and a1c.   Lab Results  Component Value Date   HGBA1C 6.4 05/23/2020

## 2020-10-12 NOTE — Assessment & Plan Note (Signed)
Last echo - normal functioning bioprosthetic valve.  Continue losartan and coreg.  Continue f/u with cardiology.  

## 2020-10-12 NOTE — Assessment & Plan Note (Signed)
Recent diagnosis of shingles. Treated.  Doing well.  Follow.

## 2020-10-12 NOTE — Assessment & Plan Note (Signed)
S/p surgery.  Continues to be followed by cardiology - monitoring.  

## 2020-10-12 NOTE — Assessment & Plan Note (Signed)
Continue losartan and coreg.  No sob.  No concern regarding volume overload.  Follow.

## 2020-10-12 NOTE — Assessment & Plan Note (Signed)
S/p CABG and AVR.  Continue risk factor modification.

## 2020-10-12 NOTE — Assessment & Plan Note (Signed)
Continue lipitor.  Low cholesterol diet and exercise.  Follow lipid panel and liver function tests.   

## 2020-10-12 NOTE — Assessment & Plan Note (Signed)
Avoid antiinflammatories.  Follow metabolic panel.  

## 2020-10-12 NOTE — Assessment & Plan Note (Signed)
Persistent pain and limited rom.  Has seen Vance Peper.  Due to see Dr Roland Rack for further evaluation.

## 2020-10-12 NOTE — Assessment & Plan Note (Signed)
Continue losartan, amlodipine and coreg.  Just evaluated by cardiology.  No changes made.  Follow pressures.  Follow metabolic panel.

## 2020-10-16 ENCOUNTER — Other Ambulatory Visit: Payer: Self-pay

## 2020-10-16 ENCOUNTER — Ambulatory Visit (INDEPENDENT_AMBULATORY_CARE_PROVIDER_SITE_OTHER): Payer: Medicare HMO

## 2020-10-16 ENCOUNTER — Telehealth: Payer: Self-pay

## 2020-10-16 ENCOUNTER — Other Ambulatory Visit (INDEPENDENT_AMBULATORY_CARE_PROVIDER_SITE_OTHER): Payer: Medicare HMO

## 2020-10-16 VITALS — BP 150/79 | HR 77 | Temp 96.5°F | Resp 16 | Ht 70.0 in | Wt 223.0 lb

## 2020-10-16 DIAGNOSIS — E1159 Type 2 diabetes mellitus with other circulatory complications: Secondary | ICD-10-CM

## 2020-10-16 DIAGNOSIS — E785 Hyperlipidemia, unspecified: Secondary | ICD-10-CM

## 2020-10-16 DIAGNOSIS — I10 Essential (primary) hypertension: Secondary | ICD-10-CM | POA: Diagnosis not present

## 2020-10-16 DIAGNOSIS — Z0000 Encounter for general adult medical examination without abnormal findings: Secondary | ICD-10-CM | POA: Diagnosis not present

## 2020-10-16 LAB — HEPATIC FUNCTION PANEL
ALT: 25 U/L (ref 0–53)
AST: 26 U/L (ref 0–37)
Albumin: 3.6 g/dL (ref 3.5–5.2)
Alkaline Phosphatase: 92 U/L (ref 39–117)
Bilirubin, Direct: 0.2 mg/dL (ref 0.0–0.3)
Total Bilirubin: 0.7 mg/dL (ref 0.2–1.2)
Total Protein: 6.2 g/dL (ref 6.0–8.3)

## 2020-10-16 LAB — LIPID PANEL
Cholesterol: 78 mg/dL (ref 0–200)
HDL: 24.2 mg/dL — ABNORMAL LOW (ref >39.00)
LDL Cholesterol: 42 mg/dL (ref 0–99)
NonHDL: 53.57
Total CHOL/HDL Ratio: 3
Triglycerides: 57 mg/dL (ref 0.0–149.0)
VLDL: 11.4 mg/dL (ref 0.0–40.0)

## 2020-10-16 LAB — BASIC METABOLIC PANEL
BUN: 16 mg/dL (ref 6–23)
CO2: 28 mEq/L (ref 19–32)
Calcium: 9 mg/dL (ref 8.4–10.5)
Chloride: 100 mEq/L (ref 96–112)
Creatinine, Ser: 1.05 mg/dL (ref 0.40–1.50)
GFR: 63.52 mL/min (ref >60.00)
Glucose, Bld: 90 mg/dL (ref 70–99)
Potassium: 3.4 mEq/L — ABNORMAL LOW (ref 3.5–5.1)
Sodium: 138 mEq/L (ref 135–145)

## 2020-10-16 LAB — HEMOGLOBIN A1C: Hgb A1c MFr Bld: 6.6 % — ABNORMAL HIGH (ref 4.6–6.5)

## 2020-10-16 LAB — TSH: TSH: 4.91 u[IU]/mL — ABNORMAL HIGH (ref 0.35–4.50)

## 2020-10-16 NOTE — Progress Notes (Signed)
Subjective:   [name] [name] is a 87 y.o. male who presents for Medicare Annual/Subsequent preventive examination.  Review of Systems    No ROS.  Medicare Wellness  Cardiac Risk Factors include: advanced age (>65men, >48 women);male gender;hypertension     Objective:    Today's Vitals   10/16/20 0943  BP: (!) 150/79  Pulse: 77  Resp: 16  Temp: (!) 96.5 F (35.8 C)  Weight: 223 lb (101.2 kg)  Height: 5\' 10"  (1.778 m)   Body mass index is 32 kg/m.  Advanced Directives 10/16/2020 10/16/2019 04/18/2019 01/26/2019 01/23/2019 01/02/2019 12/11/2018  Does Patient Have a Medical Advance Directive? No No No No No No No  Would patient like information on creating a medical advance directive? No - Patient declined No - Patient declined No - Patient declined No - Patient declined - No - Patient declined No - Patient declined    Current Medications (verified) Outpatient Encounter Medications as of 10/16/2020  Medication Sig  . allopurinol (ZYLOPRIM) 100 MG tablet Take 1 tablet (100 mg total) by mouth daily.  Marland Kitchen amLODipine (NORVASC) 5 MG tablet Take 1 tablet (5 mg total) by mouth daily.  Marland Kitchen aspirin EC 81 MG tablet Take 1 tablet (81 mg total) by mouth daily.  Marland Kitchen atorvastatin (LIPITOR) 80 MG tablet TAKE 1 TABLET EVERY MORNING  . carvedilol (COREG) 6.25 MG tablet TAKE 1 TABLET BY MOUTH 2 TIMES DAILY WITH A MEAL.  Marland Kitchen LORazepam (ATIVAN) 1 MG tablet Takes 1/2 to 1 tablet q day prn  . losartan (COZAAR) 100 MG tablet Take 1 tablet (100 mg total) by mouth daily.   No facility-administered encounter medications on file as of 10/16/2020.    Allergies (verified) Patient has no known allergies.   History: Past Medical History:  Diagnosis Date  . Aortic stenosis   . Arthritis    right shoulder  . BPH (benign prostatic hypertrophy)   . Coronary artery disease   . Gout   . Heart murmur   . History of chicken pox   . Hypercholesterolemia   . Hypertension   . Systolic heart failure (HCC)     ischemic cardiomyopathy  . Wears dentures    full upper and lower   Past Surgical History:  Procedure Laterality Date  . AORTIC VALVE REPLACEMENT N/A 01/25/2019   Procedure: AORTIC VALVE REPLACEMENT (AVR) 88mm Inspiris valve;  Surgeon: Gaye Pollack, MD;  Location: [facility];  Service: Open Heart Surgery;  Laterality: N/A;  . arm surgery     right arm fx s/p "plate insertion"  . CARDIAC CATHETERIZATION    . CATARACT EXTRACTION W/PHACO Right 11/17/2016   Procedure: CATARACT EXTRACTION PHACO AND INTRAOCULAR LENS PLACEMENT ([place])  Right;  Surgeon: Leandrew Koyanagi, MD;  Location: [facility];  Service: Ophthalmology;  Laterality: Right;  . CORONARY ARTERY BYPASS GRAFT N/A 01/25/2019   LIMA-LAD and sequential SVG-rPDA-rPL  . DENTAL SURGERY     all teeth extracted  . RIGHT HEART CATH AND CORONARY ANGIOGRAPHY N/A 01/02/2019   Procedure: RIGHT HEART CATH AND CORONARY ANGIOGRAPHY;  Surgeon: Nelva Bush, MD;  Location: [facility] CV LAB;  Service: Cardiovascular;  Laterality: N/A;  . TEE WITHOUT CARDIOVERSION N/A 01/25/2019   Procedure: TRANSESOPHAGEAL ECHOCARDIOGRAM (TEE);  Surgeon: Gaye Pollack, MD;  Location: [facility];  Service: Open Heart Surgery;  Laterality: N/A;   Family History  Problem Relation Age of Onset  . Leukemia Father   . Congestive Heart Failure Mother   . Cancer Daughter  Breast Cancer  . Prostate cancer Neg Hx   . Colon cancer Neg Hx    Social History   Socioeconomic History  . Marital status: Married    Spouse name: Not on file  . Number of children: Not on file  . Years of education: Not on file  . Highest education level: Not on file  Occupational History  . Not on file  Tobacco Use  . Smoking status: Never Smoker  . Smokeless tobacco: Current User    Types: Chew  . Tobacco comment: not ready to quit currently  Vaping Use  . Vaping Use: Never used  Substance and Sexual Activity  . Alcohol use: No    Alcohol/week: 0.0 standard drinks  .  Drug use: No  . Sexual activity: Not Currently  Other Topics Concern  . Not on file  Social History Narrative  . Not on file   Social Determinants of Health   Financial Resource Strain: Low Risk   . Difficulty of Paying Living Expenses: Not hard at all  Food Insecurity: No Food Insecurity  . Worried About Charity fundraiser in the Last Year: Never true  . Ran Out of Food in the Last Year: Never true  Transportation Needs: No Transportation Needs  . Lack of Transportation (Medical): No  . Lack of Transportation (Non-Medical): No  Physical Activity: Not on file  Stress: No Stress Concern Present  . Feeling of Stress : Not at all  Social Connections: Unknown  . Frequency of Communication with Friends and Family: Not on file  . Frequency of Social Gatherings with Friends and Family: Not on file  . Attends Religious Services: Not on file  . Active Member of Clubs or Organizations: Not on file  . Attends Archivist Meetings: Not on file  . Marital Status: Married    Tobacco Counseling Ready to quit: Not Answered Counseling given: Not Answered Comment: not ready to quit currently   Clinical Intake:  Pre-visit preparation completed: Yes        Diabetes: No  How often do you need to have someone help you when you read instructions, pamphlets, or other written materials from your doctor or pharmacy?: 1 - Never  Nutrition Risk Assessment: Has the patient had any N/V/D within the last 2 weeks?  No  Does the patient have any non-healing wounds?  No  Has the patient had any unintentional weight loss or weight gain?  No   Diabetes: If diabetic, was a CBG obtained today?  No  Did the patient bring in their glucometer from home?  No  How often do you monitor your CBG's? Does not monitor.   Financial Strains and Diabetes Management: Are you having any financial strains with the device, your supplies or your medication? No .  Does the patient want to be seen by  Chronic Care Management for management of their diabetes?  No  Would the patient like to be referred to a Nutritionist or for Diabetic Management?  No   Interpreter Needed?: No      Activities of Daily Living In your present state of health, do you have any difficulty performing the following activities: 10/16/2020  Hearing? N  Vision? N  Difficulty concentrating or making decisions? N  Walking or climbing stairs? N  Dressing or bathing? N  Doing errands, shopping? N  Preparing Food and eating ? N  Using the Toilet? N  In the past six months, have you accidently leaked urine? N  Do you have problems with loss of bowel control? N  Managing your Medications? N  Managing your Finances? N  Housekeeping or managing your Housekeeping? N  Some recent data might be hidden    Patient Care Team: Einar Pheasant, MD as PCP - General (Internal Medicine) End, Harrell Gave, MD as PCP - Cardiology (Cardiology)  Indicate any recent Medical Services you may have received from other than [facility] providers in the past year (date may be approximate).     Assessment:   This is a routine wellness examination for [name] [name].  Hearing/Vision screen  Hearing Screening   125Hz  250Hz  500Hz  1000Hz  2000Hz  3000Hz  4000Hz  6000Hz  8000Hz   Right ear:           Left ear:           Comments: Patient is able to hear conversational tones without difficulty.  No issues reported.  Vision Screening Comments: Followed [facility]  Wears lenses when reading  Cataract extraction, R eye  Visual acuity not assessed per patient preference since they have regular follow up with the ophthalmologist  Dietary issues and exercise activities discussed: Current Exercise Habits: Home exercise routine, Intensity: Mild  Goals    . Follow up with Primary Care Provider     As needed      Depression Screen PHQ 2/9 Scores 10/16/2020 10/08/2020 10/16/2019 04/23/2019 10/09/2018 09/15/2017 11/11/2016  PHQ - 2 Score 0 0 0 0 0  0 0  PHQ- 9 Score - - - 1 - - 1    Fall Risk Fall Risk  10/16/2020 05/27/2020 10/16/2019 04/18/2019 10/09/2018  Falls in the past year? 0 0 0 0 0  Number falls in past yr: 0 - - 0 -  Injury with Fall? 0 - - 0 -  Follow up Falls evaluation completed Falls evaluation completed Falls evaluation completed - -    FALL RISK PREVENTION PERTAINING TO THE HOME: Handrails in use when climbing stairs? Yes Home free of loose throw rugs in walkways, pet beds, electrical cords, etc? Yes  Adequate lighting in your home to reduce risk of falls? Yes   ASSISTIVE DEVICES UTILIZED TO PREVENT FALLS: Life alert? No  Use of a cane, walker or w/c? No   TIMED UP AND GO: Was the test performed? Yes .  Length of time to ambulate 10 feet: 15 sec.   Gait slow and steady without use of assistive device  Cognitive Function: MMSE - Mini Mental State Exam 09/14/2016 09/15/2015  Orientation to time 5 5  Orientation to Place 5 5  Registration 3 3  Attention/ Calculation 5 5  Recall 3 3  Language- name 2 objects 2 2  Language- repeat 1 1  Language- follow 3 step command 3 3  Language- read & follow direction 1 1  Write a sentence 1 1  Copy design 1 1  Total score 30 30     6CIT Screen 10/16/2019 10/09/2018 09/15/2017  What Year? 0 points 0 points 0 points  What month? 0 points 0 points 0 points  What time? 0 points 0 points 0 points  Count back from 20 - 0 points 0 points  Months in reverse 0 points 0 points 0 points  Repeat phrase - 0 points 0 points  Total Score - 0 0    Immunizations Immunization History  Administered Date(s) Administered  . Fluad Quad(high Dose 65+) 04/11/2019, 03/05/2020  . Influenza Split 05/02/2012  . Influenza, High Dose Seasonal PF 03/19/2016, 03/18/2017, 05/22/2018  .  Influenza,inj,Quad PF,6+ Mos 05/01/2013, 02/13/2014, 05/08/2015  . PFIZER(Purple Top)SARS-COV-2 Vaccination 07/17/2019, 08/07/2019, 04/10/2020  . Pneumococcal Conjugate-13 10/05/2013  . Pneumococcal  Polysaccharide-23 11/05/2014     TDAP status: Due, Education has been provided regarding the importance of this vaccine. Advised may receive this vaccine at local pharmacy or Health Dept. Aware to provide a copy of the vaccination record if obtained from local pharmacy or Health Dept. Verbalized acceptance and understanding. Deferred.   Health Maintenance Health Maintenance  Topic Date Due  . URINE MICROALBUMIN  12/04/2020  . TETANUS/TDAP  10/16/2021 (Originally 08/09/1949)  . HEMOGLOBIN A1C  11/21/2020  . INFLUENZA VACCINE  01/19/2021  . FOOT EXAM  06/27/2021  . OPHTHALMOLOGY EXAM  09/03/2021  . COVID-19 Vaccine  Completed  . PNA vac Low Risk Adult  Completed  . HPV VACCINES  Aged Out   Colorectal cancer screening: No longer required.   DG Chest 2 View: last completed 02/21/19.  Vision Screening: Recommended annual ophthalmology exams for early detection of glaucoma and other disorders of the eye. Is the patient up to date with their annual eye exam?  Yes   Dental Screening: Recommended annual dental exams for proper oral hygiene. Dentures.   Community Resource Referral / Chronic Care Management: CRR required this visit?  No   CCM required this visit?  No      Plan:    Keep all routine maintenance appointments.   Follow up 01/08/21 @ 1:30  I have personally reviewed and noted the following in the patient's chart:   . Medical and social history . Use of alcohol, tobacco or illicit drugs  . Current medications and supplements . Functional ability and status . Nutritional status . Physical activity . Advanced directives . List of other physicians . Hospitalizations, surgeries, and ER visits in previous 12 months . Vitals . Screenings to include cognitive, depression, and falls . Referrals and appointments  In addition, I have reviewed and discussed with patient certain preventive protocols, quality metrics, and best practice recommendations. A written personalized care  plan for preventive services as well as general preventive health recommendations were provided to patient.     Ashok Pall, LPN   02/13/4157

## 2020-10-16 NOTE — Telephone Encounter (Signed)
Patient notes he is having a gout flare up in the L knee. He requests anything except colchicine be sent to local pharmacy. Notes this medication gives him extreme diarrhea and would rather avoid. Taking all scheduled medications as directed. No other issues. Deferred to pcp.

## 2020-10-16 NOTE — Telephone Encounter (Signed)
Patient declined 7 am appt tomorrow because that is too early. He says the knee is a little swollen and is painful when he walks but not like it was. He says he has had gout in his knee before and this is what it felt like.

## 2020-10-16 NOTE — Patient Instructions (Addendum)
[name] [name] , Thank you for taking time to come for your Medicare Wellness Visit. I appreciate your ongoing commitment to your health goals. Please review the following plan we discussed and let me know if I can assist you in the future.   These are the goals we discussed: Goals    . Follow up with Primary Care Provider     As needed       This is a list of the screening recommended for you and due dates:  Health Maintenance  Topic Date Due  . Urine Protein Check  12/04/2020  . Tetanus Vaccine  10/16/2021*  . Hemoglobin A1C  11/21/2020  . Flu Shot  01/19/2021  . Complete foot exam   06/27/2021  . Eye exam for diabetics  09/03/2021  . COVID-19 Vaccine  Completed  . Pneumonia vaccines  Completed  . HPV Vaccine  Aged Out  *Topic was postponed. The date shown is not the original due date.    Immunizations Immunization History  Administered Date(s) Administered  . Fluad Quad(high Dose 65+) 04/11/2019, 03/05/2020  . Influenza Split 05/02/2012  . Influenza, High Dose Seasonal PF 03/19/2016, 03/18/2017, 05/22/2018  . Influenza,inj,Quad PF,6+ Mos 05/01/2013, 02/13/2014, 05/08/2015  . PFIZER(Purple Top)SARS-COV-2 Vaccination 07/17/2019, 08/07/2019, 04/10/2020  . Pneumococcal Conjugate-13 10/05/2013  . Pneumococcal Polysaccharide-23 11/05/2014    Advanced directives: not yet completed  Next appointment: Follow up in one year for your annual wellness visit.   Preventive Care 71 Years and Older, Male Preventive care refers to lifestyle choices and visits with your health care provider that can promote health and wellness. What does preventive care include?  A yearly physical exam. This is also called an annual well check.  Dental exams once or twice a year.  Routine eye exams. Ask your health care provider how often you should have your eyes checked.  Personal lifestyle choices, including:  Daily care of your teeth and gums.  Regular physical activity.  Eating a healthy  diet.  Avoiding tobacco and drug use.  Limiting alcohol use.  Practicing safe sex.  Taking low doses of aspirin every day.  Taking vitamin and mineral supplements as recommended by your health care provider. What happens during an annual well check? The services and screenings done by your health care provider during your annual well check will depend on your age, overall health, lifestyle risk factors, and family history of disease. Counseling  Your health care provider may ask you questions about your:  Alcohol use.  Tobacco use.  Drug use.  Emotional well-being.  Home and relationship well-being.  Sexual activity.  Eating habits.  History of falls.  Memory and ability to understand (cognition).  Work and work Statistician. Screening  You may have the following tests or measurements:  Height, weight, and BMI.  Blood pressure.  Lipid and cholesterol levels. These may be checked every 5 years, or more frequently if you are over 42 years old.  Skin check.  Lung cancer screening. You may have this screening every year starting at age 70 if you have a 30-pack-year history of smoking and currently smoke or have quit within the past 15 years.  Fecal occult blood test (FOBT) of the stool. You may have this test every year starting at age 48.  Flexible sigmoidoscopy or colonoscopy. You may have a sigmoidoscopy every 5 years or a colonoscopy every 10 years starting at age 65.  Prostate cancer screening. Recommendations will vary depending on your family history and other risks.  Hepatitis C blood test.  Hepatitis B blood test.  Sexually transmitted disease (STD) testing.  Diabetes screening. This is done by checking your blood sugar (glucose) after you have not eaten for a while (fasting). You may have this done every 1-3 years.  Abdominal aortic aneurysm (AAA) screening. You may need this if you are a current or former smoker.  Osteoporosis. You may be screened  starting at age 41 if you are at high risk. Talk with your health care provider about your test results, treatment options, and if necessary, the need for more tests. Vaccines  Your health care provider may recommend certain vaccines, such as:  Influenza vaccine. This is recommended every year.  Tetanus, diphtheria, and acellular pertussis (Tdap, Td) vaccine. You may need a Td booster every 10 years.  Zoster vaccine. You may need this after age 29.  Pneumococcal 13-valent conjugate (PCV13) vaccine. One dose is recommended after age 69.  Pneumococcal polysaccharide (PPSV23) vaccine. One dose is recommended after age 53. Talk to your health care provider about which screenings and vaccines you need and how often you need them. This information is not intended to replace advice given to you by your health care provider. Make sure you discuss any questions you have with your health care provider. Document Released: 07/04/2015 Document Revised: 02/25/2016 Document Reviewed: 04/08/2015 Elsevier Interactive Patient Education  2017 [place] Prevention in the Home Falls can cause injuries. They can happen to people of all ages. There are many things you can do to make your home safe and to help prevent falls. What can I do on the outside of my home?  Regularly fix the edges of walkways and driveways and fix any cracks.  Remove anything that might make you trip as you walk through a door, such as a raised step or threshold.  Trim any bushes or trees on the path to your home.  Use bright outdoor lighting.  Clear any walking paths of anything that might make someone trip, such as rocks or tools.  Regularly check to see if handrails are loose or broken. Make sure that both sides of any steps have handrails.  Any raised decks and porches should have guardrails on the edges.  Have any leaves, snow, or ice cleared regularly.  Use sand or salt on walking paths during  winter.  Clean up any spills in your garage right away. This includes oil or grease spills. What can I do in the bathroom?  Use night lights.  Install grab bars by the toilet and in the tub and shower. Do not use towel bars as grab bars.  Use non-skid mats or decals in the tub or shower.  If you need to sit down in the shower, use a plastic, non-slip stool.  Keep the floor dry. Clean up any water that spills on the floor as soon as it happens.  Remove soap buildup in the tub or shower regularly.  Attach bath mats securely with double-sided non-slip rug tape.  Do not have throw rugs and other things on the floor that can make you trip. What can I do in the bedroom?  Use night lights.  Make sure that you have a light by your bed that is easy to reach.  Do not use any sheets or blankets that are too big for your bed. They should not hang down onto the floor.  Have a firm chair that has side arms. You can use this for support while you  get dressed.  Do not have throw rugs and other things on the floor that can make you trip. What can I do in the kitchen?  Clean up any spills right away.  Avoid walking on wet floors.  Keep items that you use a lot in easy-to-reach places.  If you need to reach something above you, use a strong step stool that has a grab bar.  Keep electrical cords out of the way.  Do not use floor polish or wax that makes floors slippery. If you must use wax, use non-skid floor wax.  Do not have throw rugs and other things on the floor that can make you trip. What can I do with my stairs?  Do not leave any items on the stairs.  Make sure that there are handrails on both sides of the stairs and use them. Fix handrails that are broken or loose. Make sure that handrails are as long as the stairways.  Check any carpeting to make sure that it is firmly attached to the stairs. Fix any carpet that is loose or worn.  Avoid having throw rugs at the top or  bottom of the stairs. If you do have throw rugs, attach them to the floor with carpet tape.  Make sure that you have a light switch at the top of the stairs and the bottom of the stairs. If you do not have them, ask someone to add them for you. What else can I do to help prevent falls?  Wear shoes that:  Do not have high heels.  Have rubber bottoms.  Are comfortable and fit you well.  Are closed at the toe. Do not wear sandals.  If you use a stepladder:  Make sure that it is fully opened. Do not climb a closed stepladder.  Make sure that both sides of the stepladder are locked into place.  Ask someone to hold it for you, if possible.  Clearly mark and make sure that you can see:  Any grab bars or handrails.  First and last steps.  Where the edge of each step is.  Use tools that help you move around (mobility aids) if they are needed. These include:  Canes.  Walkers.  Scooters.  Crutches.  Turn on the lights when you go into a dark area. Replace any light bulbs as soon as they burn out.  Set up your furniture so you have a clear path. Avoid moving your furniture around.  If any of your floors are uneven, fix them.  If there are any pets around you, be aware of where they are.  Review your medicines with your doctor. Some medicines can make you feel dizzy. This can increase your chance of falling. Ask your doctor what other things that you can do to help prevent falls. This information is not intended to replace advice given to you by your health care provider. Make sure you discuss any questions you have with your health care provider. Document Released: 04/03/2009 Document Revised: 11/13/2015 Document Reviewed: 07/12/2014 Elsevier Interactive Patient Education  2017 [employer].

## 2020-10-16 NOTE — Telephone Encounter (Signed)
Can the 1:30 do a different time since is virtual and have him come in at 1:30.

## 2020-10-16 NOTE — Telephone Encounter (Signed)
Patient has been added to schedule and appt moved.

## 2020-10-16 NOTE — Telephone Encounter (Signed)
Need to confirm symptoms - swelling?  Redness?.  If any acute symptoms, will need to be seen.  I would like to see it and evaluate and then determine what is best treatment.  If he passes screening, can he come in tomorrow am at 7:00.

## 2020-10-17 ENCOUNTER — Other Ambulatory Visit: Payer: Self-pay

## 2020-10-17 ENCOUNTER — Ambulatory Visit (INDEPENDENT_AMBULATORY_CARE_PROVIDER_SITE_OTHER): Payer: Medicare HMO | Admitting: Internal Medicine

## 2020-10-17 VITALS — BP 122/70 | HR 65 | Temp 97.2°F | Resp 16 | Ht 70.0 in | Wt 222.8 lb

## 2020-10-17 DIAGNOSIS — I2510 Atherosclerotic heart disease of native coronary artery without angina pectoris: Secondary | ICD-10-CM

## 2020-10-17 DIAGNOSIS — I77810 Thoracic aortic ectasia: Secondary | ICD-10-CM

## 2020-10-17 DIAGNOSIS — I5022 Chronic systolic (congestive) heart failure: Secondary | ICD-10-CM

## 2020-10-17 DIAGNOSIS — B029 Zoster without complications: Secondary | ICD-10-CM

## 2020-10-17 DIAGNOSIS — M1A9XX0 Chronic gout, unspecified, without tophus (tophi): Secondary | ICD-10-CM

## 2020-10-17 DIAGNOSIS — I10 Essential (primary) hypertension: Secondary | ICD-10-CM

## 2020-10-17 DIAGNOSIS — M25512 Pain in left shoulder: Secondary | ICD-10-CM

## 2020-10-17 DIAGNOSIS — E876 Hypokalemia: Secondary | ICD-10-CM

## 2020-10-17 DIAGNOSIS — E1159 Type 2 diabetes mellitus with other circulatory complications: Secondary | ICD-10-CM

## 2020-10-17 DIAGNOSIS — N1831 Chronic kidney disease, stage 3a: Secondary | ICD-10-CM

## 2020-10-17 DIAGNOSIS — E1122 Type 2 diabetes mellitus with diabetic chronic kidney disease: Secondary | ICD-10-CM | POA: Diagnosis not present

## 2020-10-17 MED ORDER — METHYLPREDNISOLONE 4 MG PO TBPK: ORAL_TABLET | ORAL | 0 refills | Status: DC

## 2020-10-17 NOTE — Progress Notes (Signed)
Patient ID: CORLISS LAMARTINA, male   DOB: May 05, 1931, 87 y.o.   MRN: [id number]   Subjective:    Patient ID: [name], male    DOB: 12-Dec-1930, 87 y.o.   MRN: [id number]  HPI This visit occurred during the SARS-CoV-2 public health emergency.  Safety protocols were in place, including screening questions prior to the visit, additional usage of staff PPE, and extensive cleaning of exam room while observing appropriate contact time as indicated for disinfecting solutions.  Patient here for work in appt.  Work in for knee pain - concern regarding gout flare.  States recently noticed increased pain/swelling - left knee. No injury.  No increased pressure on the knee.  States feels similar to his previous gout flare.  Unable to take colchicine - intolerance - diarrhea.  Unable to take antiinflammatories.  Discussed steroids.  Taking allopurinol. No chest pain.  Breathing stable.  Eating.  Increased left shoulder pain. Unable to lift arm - at shoulder.  Limits his activity and unable to do ADLs.    Past Medical History:  Diagnosis Date  . Aortic stenosis   . Arthritis    right shoulder  . BPH (benign prostatic hypertrophy)   . Coronary artery disease   . Gout   . Heart murmur   . History of chicken pox   . Hypercholesterolemia   . Hypertension   . Systolic heart failure (HCC)    ischemic cardiomyopathy  . Wears dentures    full upper and lower   Past Surgical History:  Procedure Laterality Date  . AORTIC VALVE REPLACEMENT N/A 01/25/2019   Procedure: AORTIC VALVE REPLACEMENT (AVR) 17mm Inspiris valve;  Surgeon: Gaye Pollack, MD;  Location: [facility];  Service: Open Heart Surgery;  Laterality: N/A;  . arm surgery     right arm fx s/p "plate insertion"  . CARDIAC CATHETERIZATION    . CATARACT EXTRACTION W/PHACO Right 11/17/2016   Procedure: CATARACT EXTRACTION PHACO AND INTRAOCULAR LENS PLACEMENT ([place])  Right;  Surgeon: Leandrew Koyanagi, MD;  Location: [facility];  Service:  Ophthalmology;  Laterality: Right;  . CORONARY ARTERY BYPASS GRAFT N/A 01/25/2019   LIMA-LAD and sequential [id number]  . DENTAL SURGERY     all teeth extracted  . RIGHT HEART CATH AND CORONARY ANGIOGRAPHY N/A 01/02/2019   Procedure: RIGHT HEART CATH AND CORONARY ANGIOGRAPHY;  Surgeon: Nelva Bush, MD;  Location: [facility];  Service: Cardiovascular;  Laterality: N/A;  . TEE WITHOUT CARDIOVERSION N/A 01/25/2019   Procedure: TRANSESOPHAGEAL ECHOCARDIOGRAM (TEE);  Surgeon: Gaye Pollack, MD;  Location: [facility];  Service: Open Heart Surgery;  Laterality: N/A;   Family History  Problem Relation Age of Onset  . Leukemia Father   . Congestive Heart Failure Mother   . Cancer Daughter        Breast Cancer  . Prostate cancer Neg Hx   . Colon cancer Neg Hx    Social History   Socioeconomic History  . Marital status: Married    Spouse name: Not on file  . Number of children: Not on file  . Years of education: Not on file  . Highest education level: Not on file  Occupational History  . Not on file  Tobacco Use  . Smoking status: Never Smoker  . Smokeless tobacco: Current User    Types: Chew  . Tobacco comment: not ready to quit currently  Vaping Use  . Vaping Use: Never used  Substance and Sexual Activity  . Alcohol  use: No    Alcohol/week: 0.0 standard drinks  . Drug use: No  . Sexual activity: Not Currently  Other Topics Concern  . Not on file  Social History Narrative  . Not on file   Social Determinants of Health   Financial Resource Strain: Low Risk   . Difficulty of Paying Living Expenses: Not hard at all  Food Insecurity: No Food Insecurity  . Worried About Charity fundraiser in the Last Year: Never true  . Ran Out of Food in the Last Year: Never true  Transportation Needs: No Transportation Needs  . Lack of Transportation (Medical): No  . Lack of Transportation (Non-Medical): No  Physical Activity: Not on file  Stress: No Stress Concern Present  .  Feeling of Stress : Not at all  Social Connections: Unknown  . Frequency of Communication with Friends and Family: Not on file  . Frequency of Social Gatherings with Friends and Family: Not on file  . Attends Religious Services: Not on file  . Active Member of Clubs or Organizations: Not on file  . Attends Archivist Meetings: Not on file  . Marital Status: Married    Outpatient Encounter Medications as of 10/17/2020  Medication Sig  . allopurinol (ZYLOPRIM) 100 MG tablet Take 1 tablet (100 mg total) by mouth daily.  Marland Kitchen amLODipine (NORVASC) 5 MG tablet Take 1 tablet (5 mg total) by mouth daily.  Marland Kitchen aspirin EC 81 MG tablet Take 1 tablet (81 mg total) by mouth daily.  Marland Kitchen atorvastatin (LIPITOR) 80 MG tablet TAKE 1 TABLET EVERY MORNING  . carvedilol (COREG) 6.25 MG tablet TAKE 1 TABLET BY MOUTH 2 TIMES DAILY WITH A MEAL.  Marland Kitchen LORazepam (ATIVAN) 1 MG tablet Takes 1/2 to 1 tablet q day prn  . methylPREDNISolone (MEDROL DOSEPAK) 4 MG TBPK tablet Medrol dosepack - six day taper.  Take as directed.  Marland Kitchen losartan (COZAAR) 100 MG tablet Take 1 tablet (100 mg total) by mouth daily.   No facility-administered encounter medications on file as of 10/17/2020.    Review of Systems  Constitutional: Negative for appetite change and unexpected weight change.  HENT: Negative for congestion and sinus pressure.   Respiratory: Negative for cough and chest tightness.        Breathing stable.   Cardiovascular: Negative for chest pain and palpitations.       No increased swelling.   Gastrointestinal: Negative for abdominal pain, diarrhea, nausea and vomiting.  Genitourinary: Negative for difficulty urinating and dysuria.  Musculoskeletal: Negative for myalgias.       Increased knee pain and swelling as outlined.   Skin: Negative for color change and rash.  Neurological: Negative for dizziness, light-headedness and headaches.  Psychiatric/Behavioral: Negative for agitation and dysphoric mood.        Objective:    Physical Exam Vitals reviewed.  Constitutional:      General: He is not in acute distress.    Appearance: Normal appearance. He is well-developed.  HENT:     Head: Normocephalic and atraumatic.     Right Ear: External ear normal.     Left Ear: External ear normal.  Eyes:     General: No scleral icterus.       Right eye: No discharge.        Left eye: No discharge.     Conjunctiva/sclera: Conjunctivae normal.  Cardiovascular:     Rate and Rhythm: Normal rate and regular rhythm.     Comments: Rhythm appears to be  regular with premature beats.  Pulmonary:     Effort: Pulmonary effort is normal. No respiratory distress.     Breath sounds: Normal breath sounds.  Abdominal:     General: Bowel sounds are normal.     Palpations: Abdomen is soft.     Tenderness: There is no abdominal tenderness.  Musculoskeletal:     Cervical back: Neck supple. No tenderness.     Comments: Left knee - minimal erythema with increased soft tissue - left anterior knee.  Increased pain to palpation - left anterior knee.  No pain with flexion and extension.   Lymphadenopathy:     Cervical: No cervical adenopathy.  Skin:    Findings: No erythema or rash.  Neurological:     Mental Status: He is alert.  Psychiatric:        Mood and Affect: Mood normal.        Behavior: Behavior normal.     BP 122/70   Pulse 65   Temp (!) 97.2 F (36.2 C) (Oral)   Resp 16   Ht $R'5\' 10"'TN$  (1.778 m)   Wt 222 lb 12.8 oz (101.1 kg)   SpO2 98%   BMI 31.97 kg/m  Wt Readings from Last 3 Encounters:  10/17/20 222 lb 12.8 oz (101.1 kg)  10/16/20 223 lb (101.2 kg)  10/08/20 236 lb (107 kg)     Lab Results  Component Value Date   WBC 9.6 05/23/2020   HGB 13.2 05/23/2020   HCT 39.4 05/23/2020   PLT 166.0 05/23/2020   GLUCOSE 90 10/16/2020   CHOL 78 10/16/2020   TRIG 57.0 10/16/2020   HDL 24.20 (L) 10/16/2020   LDLCALC 42 10/16/2020   ALT 25 10/16/2020   AST 26 10/16/2020   NA 138 10/16/2020   K  3.4 (L) 10/16/2020   CL 100 10/16/2020   CREATININE 1.05 10/16/2020   BUN 16 10/16/2020   CO2 28 10/16/2020   TSH 4.91 (H) 10/16/2020   PSA 4.15 (H) 02/08/2014   INR 1.7 (H) 01/25/2019   HGBA1C 6.6 (H) 10/16/2020   MICROALBUR 46.0 (H) 12/05/2019       Assessment & Plan:   Problem List Items Addressed This Visit    Chronic HFrEF (heart failure with reduced ejection fraction) ([place])    Continue losartan and coreg.  No evidence of volume overload on exam.  Follow.       CKD (chronic kidney disease) stage 3, GFR 30-59 ml/min (HCC)    Avoid antiinflammatories.  Follow metabolic panel.       Coronary artery disease involving native coronary artery of native heart without angina pectoris - Primary    EKG - today.  On exam - rhythm appeared to be regular with premature beats.  EKG to confirm regular rhythm.  EKG - SR with PVCs.  Symptoms stable.  Continue coreg, losartan and lipitor.  Follow.       Relevant Orders   EKG 12-Lead (Completed)   Diabetes ([place])    On no medication.  Low carb diet and exercise.  Discussed steroids - increase blood sugar.  Follow met b and a1c.       Dilatation of thoracic aorta (HCC)    S/p surgery.  Followed by cardiology.  They are following.       Gout    Has been on allopurinol.  Recent flare - left knee.  States feels similar to his previous flares.  Unable to take colchicine.  Avoid antiinflammatories.  Medrol dosepack as  directed.  Follow.       Hypertension    Continue losartan, amlodipine and coreg.  Blood pressure doing well.  Follow pressures.  Follow metabolic panel.       Shingles    Recent diagnosis of shingles.  Lesions have healed.  Continues on gabapentin.       Shoulder pain, left    Seeing ortho. Limited rom.  Has appt with Dr Roland Rack 11/03/20.         Other Visit Diagnoses    Hypokalemia       Relevant Orders   Potassium       Einar Pheasant, MD

## 2020-10-18 ENCOUNTER — Encounter: Payer: Self-pay | Admitting: Internal Medicine

## 2020-10-18 NOTE — Assessment & Plan Note (Signed)
Has been on allopurinol.  Recent flare - left knee.  States feels similar to his previous flares.  Unable to take colchicine.  Avoid antiinflammatories.  Medrol dosepack as directed.  Follow.

## 2020-10-18 NOTE — Assessment & Plan Note (Signed)
On no medication.  Low carb diet and exercise.  Discussed steroids - increase blood sugar.  Follow met b and a1c.

## 2020-10-18 NOTE — Assessment & Plan Note (Signed)
Avoid antiinflammatories.  Follow metabolic panel.  

## 2020-10-18 NOTE — Assessment & Plan Note (Signed)
Continue losartan and coreg.  No evidence of volume overload on exam.  Follow.

## 2020-10-18 NOTE — Assessment & Plan Note (Signed)
EKG - today.  On exam - rhythm appeared to be regular with premature beats.  EKG to confirm regular rhythm.  EKG - SR with PVCs.  Symptoms stable.  Continue coreg, losartan and lipitor.  Follow.

## 2020-10-18 NOTE — Assessment & Plan Note (Signed)
S/p surgery.  Followed by cardiology.  They are following.

## 2020-10-18 NOTE — Assessment & Plan Note (Signed)
Recent diagnosis of shingles.  Lesions have healed.  Continues on gabapentin.

## 2020-10-18 NOTE — Assessment & Plan Note (Signed)
Seeing ortho. Limited rom.  Has appt with Dr Roland Rack 11/03/20.

## 2020-10-18 NOTE — Assessment & Plan Note (Signed)
Continue losartan, amlodipine and coreg.  Blood pressure doing well.  Follow pressures.  Follow metabolic panel.

## 2020-10-30 ENCOUNTER — Other Ambulatory Visit: Payer: Medicare HMO

## 2020-10-31 ENCOUNTER — Other Ambulatory Visit: Payer: Self-pay

## 2020-10-31 ENCOUNTER — Other Ambulatory Visit (INDEPENDENT_AMBULATORY_CARE_PROVIDER_SITE_OTHER): Payer: Medicare HMO

## 2020-10-31 DIAGNOSIS — E876 Hypokalemia: Secondary | ICD-10-CM | POA: Diagnosis not present

## 2020-10-31 IMAGING — CR CHEST - 2 VIEW
2 series · 2 of 2 positions shown · non-contrast
Comparison: Chest CT angiograph December 11, 2018

CLINICAL DATA: Pre-admission radiograph for CABG

EXAM:
CHEST - 2 VIEW

[w chest pa]
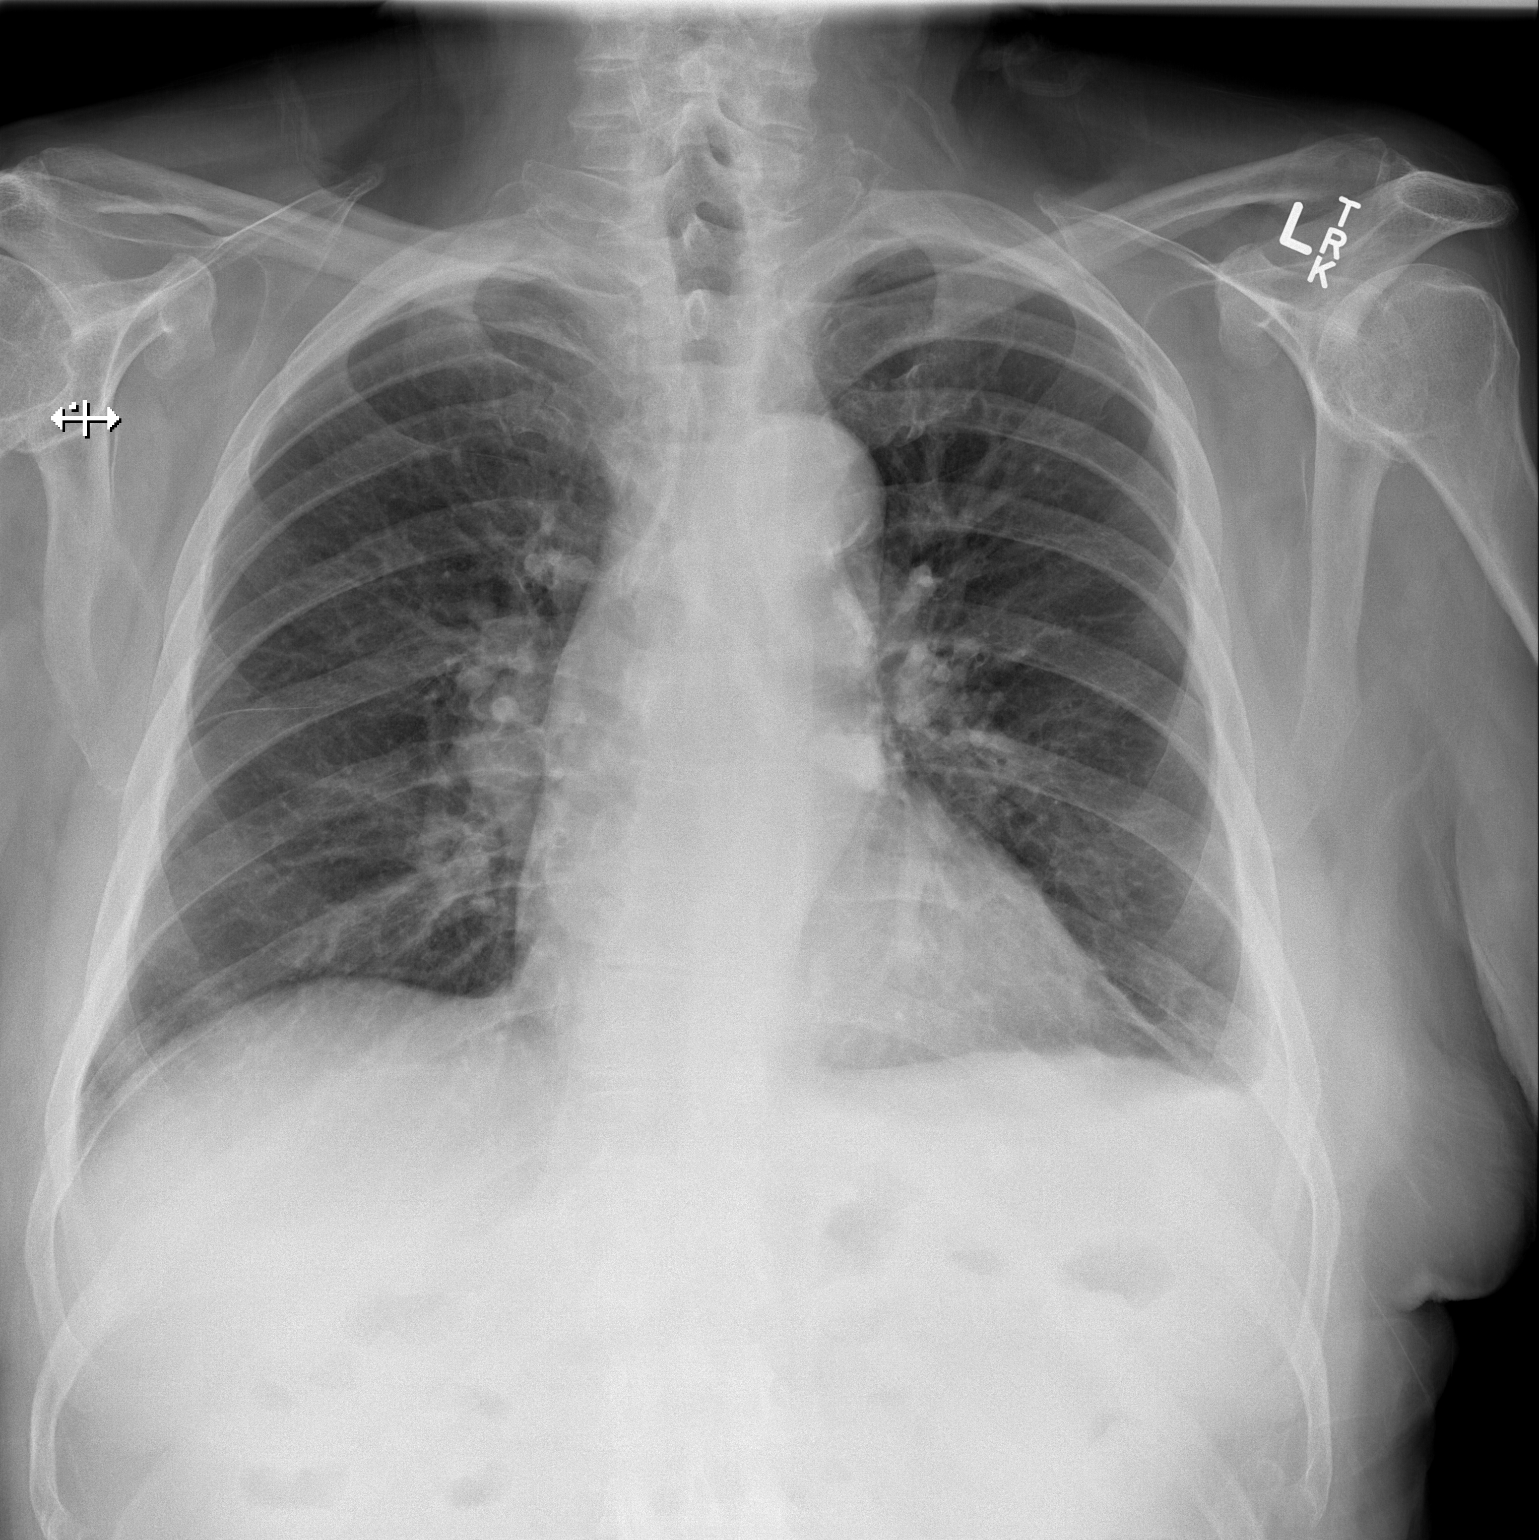

[w chest lat]
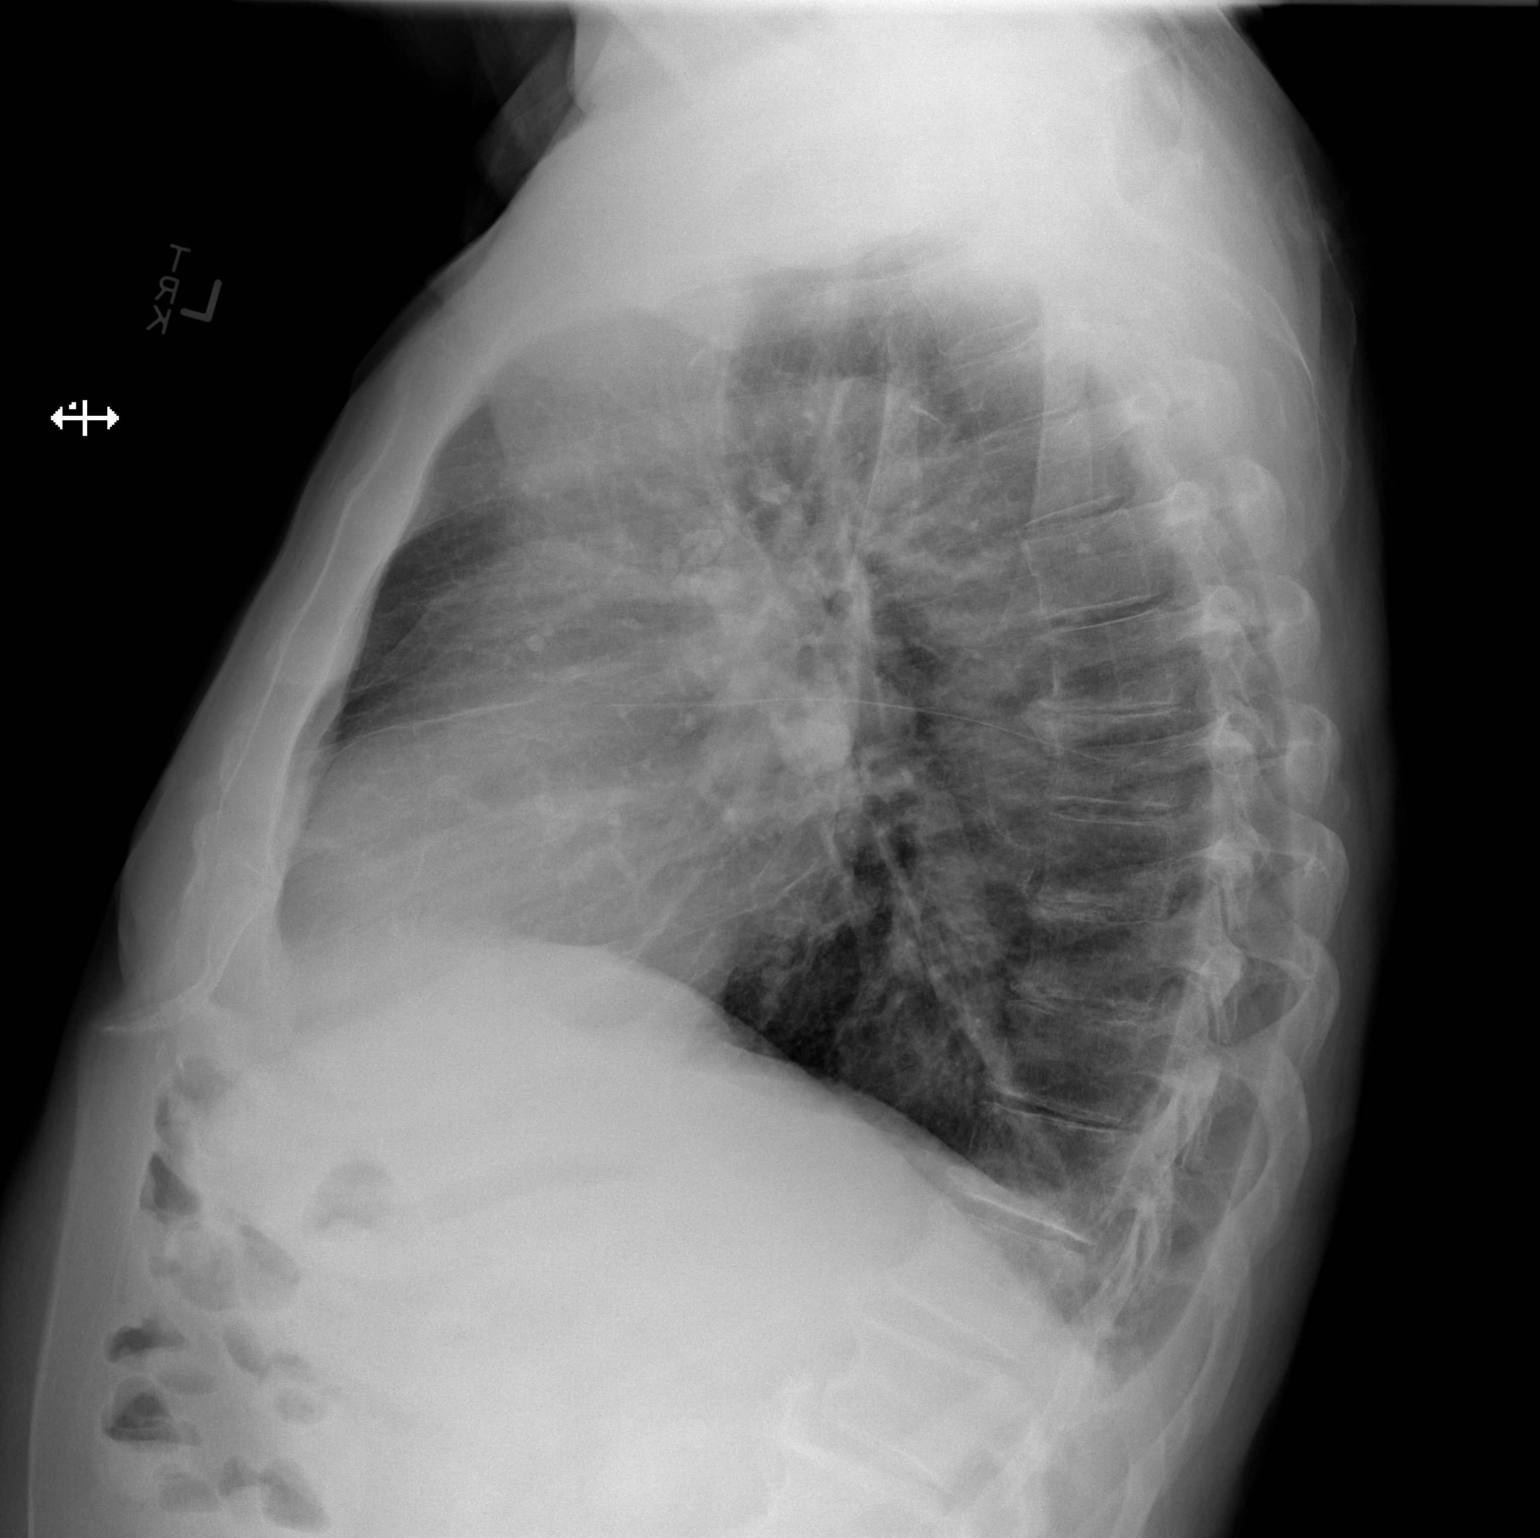

[2 of 2 positions shown; findings below may reference images not displayed]

FINDINGS: Small residual pleural effusions, left greater than right with some
hazy basilar opacities likely reflecting, likely reflecting a
combination of hypoventilation and chronic interstitial changes. No
consolidative opacity. No pneumothorax. Calcified tortuous aorta.
Cardiomediastinal contours are otherwise unremarkable. Degenerative
changes in the spine and shoulders. No acute osseous or soft tissue
abnormality.
IMPRESSION: Small residual pleural effusions, left greater than right.

Low volumes and chronic interstitial changes.

## 2020-11-01 LAB — POTASSIUM: Potassium: 3.5 mmol/L (ref 3.5–5.3)

## 2020-11-02 IMAGING — DX PORTABLE CHEST - 1 VIEW
1 series · 1 of 1 positions shown · non-contrast
Comparison: Portable exam 6032 hours compared to 01/23/2019

CLINICAL DATA: Post AVR

EXAM:
PORTABLE CHEST 1 VIEW

[chest ap]
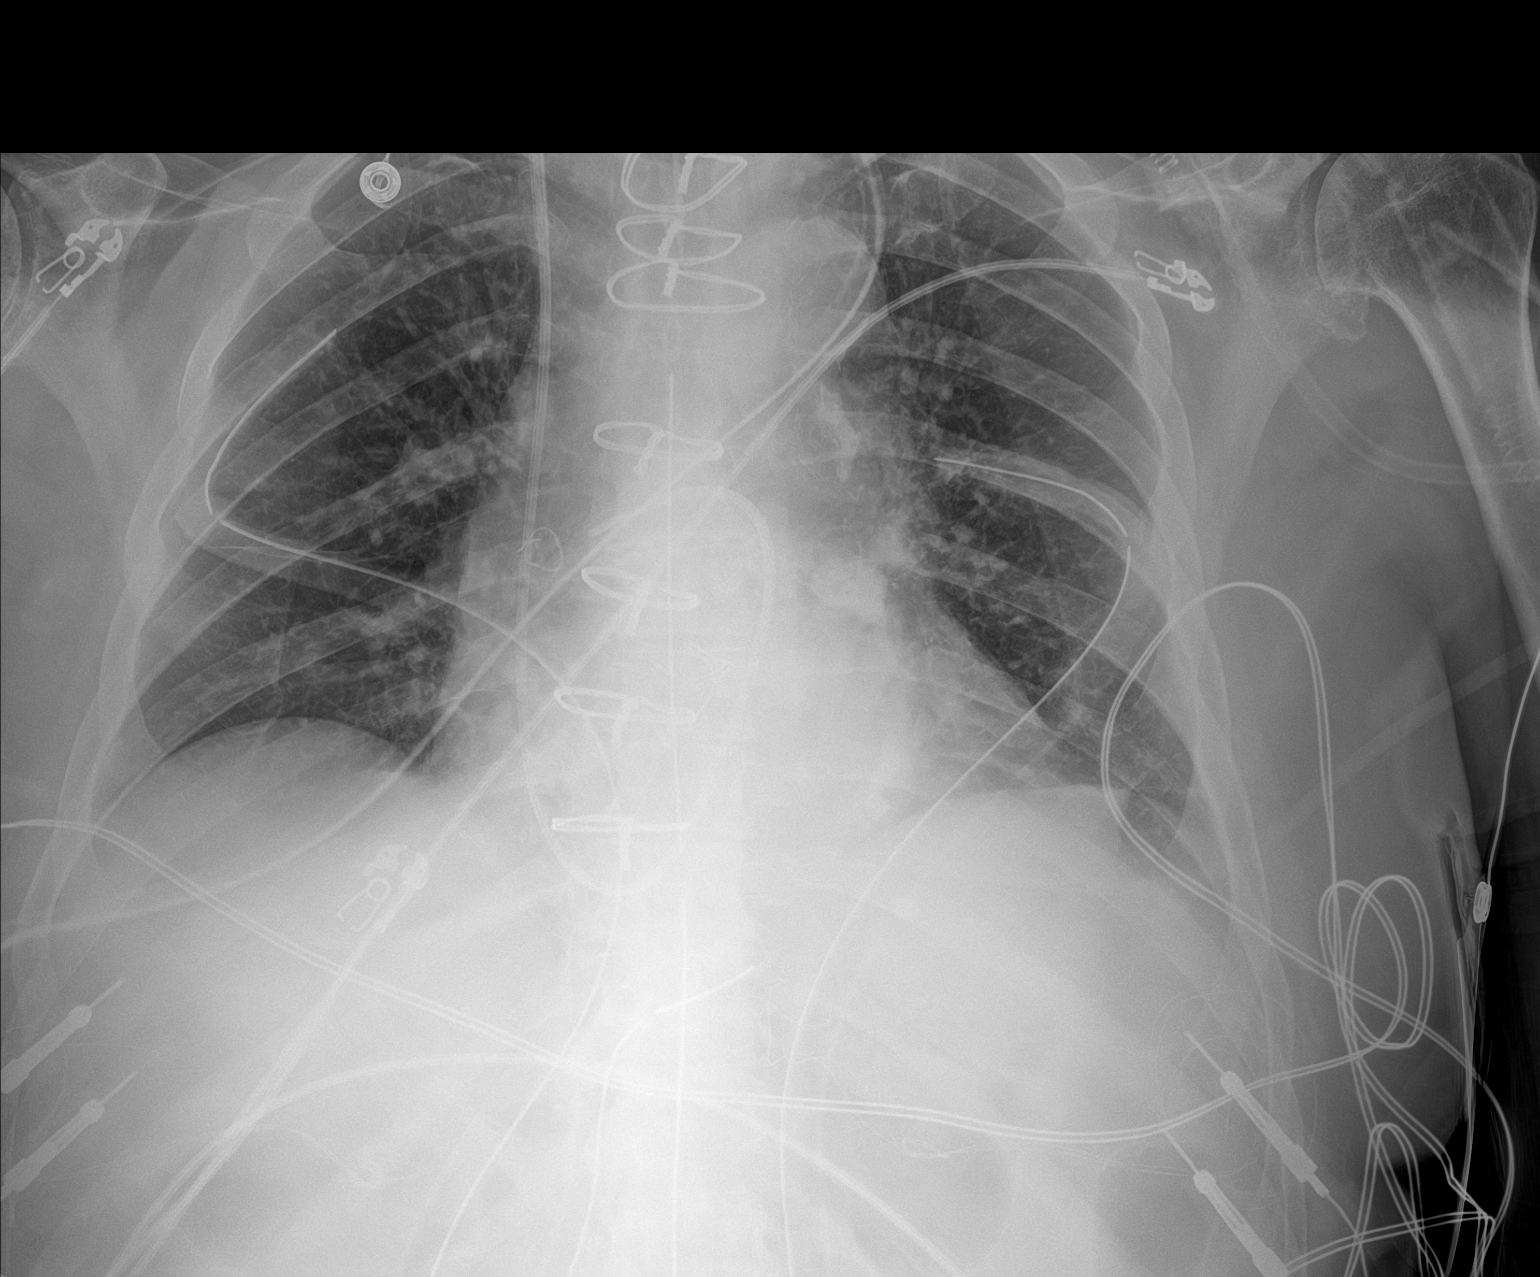

[1 of 1 positions shown; findings below may reference images not displayed]

FINDINGS: Tip of endotracheal tube projects 4.9 cm above carina.

RIGHT jugular Swan-Ganz catheter with tip projecting over RIGHT
pulmonary artery at hilum.

Mediastinal drains and BILATERAL thoracostomy tubes present.

No nasogastric tube seen.

Normal heart size post AVR.

Probable mitral annular calcification.

Minimal LEFT basilar atelectasis.

Lungs otherwise clear.

No infiltrate, pleural effusion, or pneumothorax.

Bones demineralized.
IMPRESSION: Postsurgical changes as above.

## 2020-11-03 IMAGING — DX PORTABLE CHEST - 1 VIEW
1 series · 1 of 1 positions shown · non-contrast
Comparison: 01/25/2019.

CLINICAL DATA: AVR.

EXAM:
PORTABLE CHEST 1 VIEW

[chest ap]
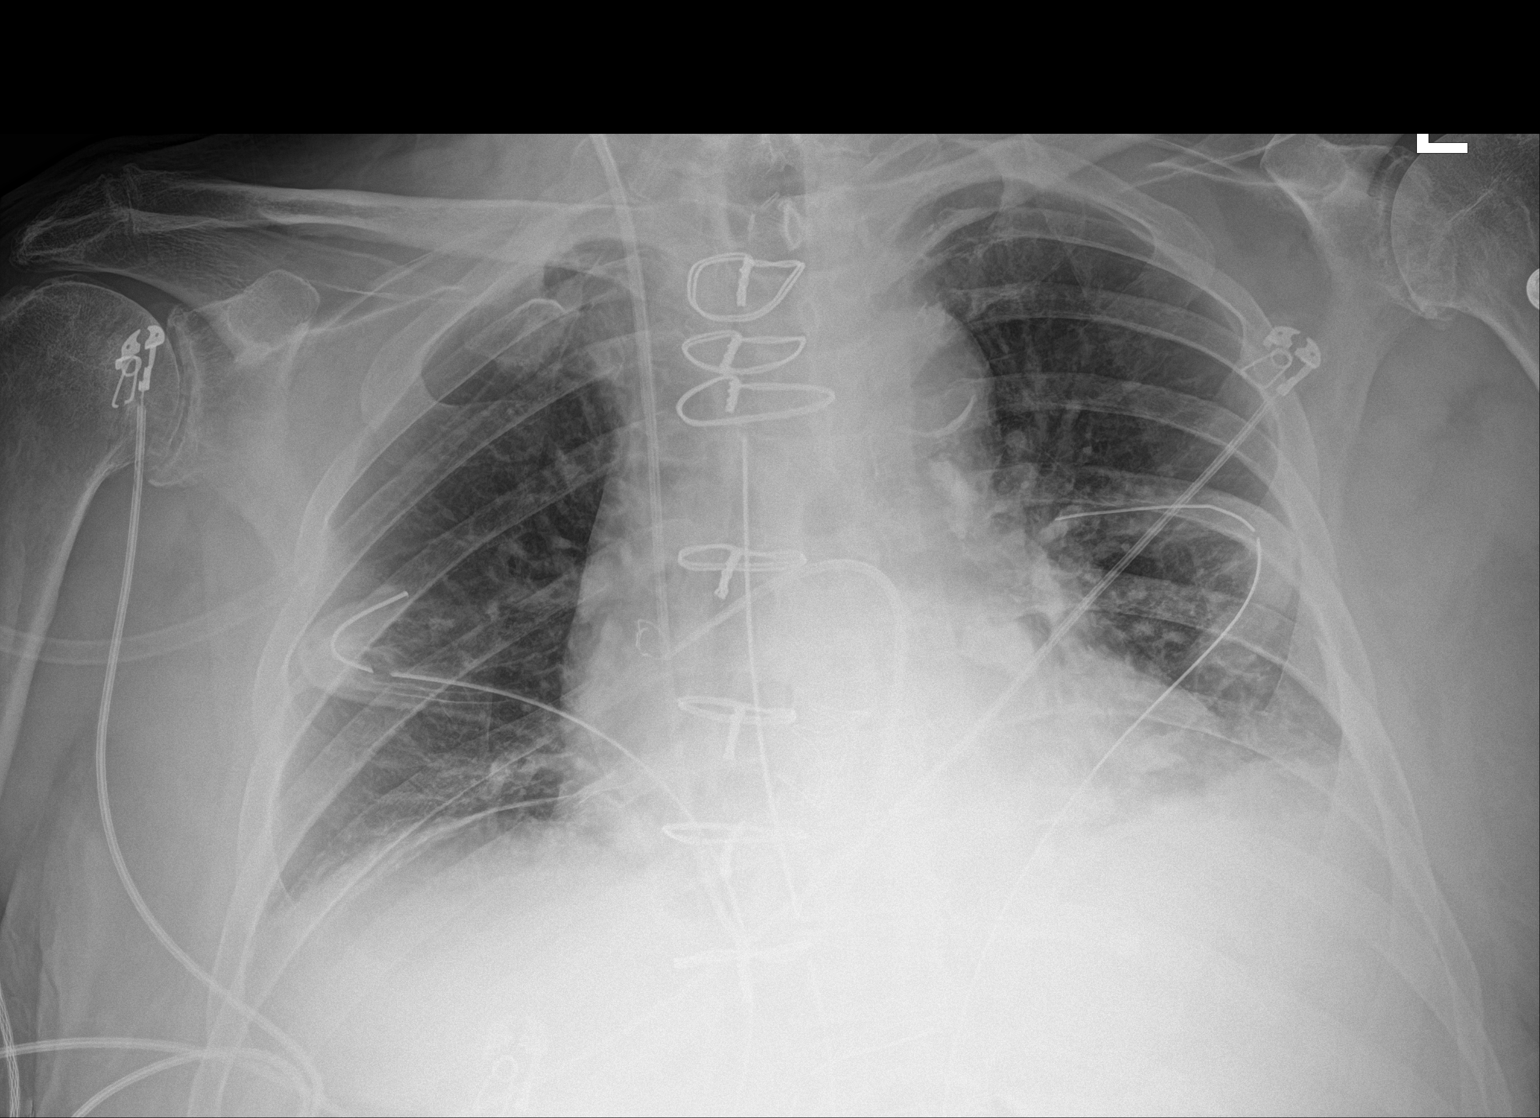

[1 of 1 positions shown; findings below may reference images not displayed]

FINDINGS: Interim extubation and removal of NG tube. Swan-Ganz catheter,
bilateral chest tubes, mediastinal drainage catheter in stable
position prior CABG. Stable cardiomegaly. Bibasilar atelectasis
noted. Tiny left pleural effusion cannot be excluded. No
pneumothorax.
IMPRESSION: 1. Interim extubation removal of NG tube. Swan-Ganz catheter,
bilateral chest tubes, mediastinal drainage catheter stable
position. No pneumothorax.

2.  Prior AVR.  Stable cardiomegaly.  Bibasilar atelectasis noted.

## 2020-11-05 DIAGNOSIS — B0223 Postherpetic polyneuropathy: Secondary | ICD-10-CM | POA: Diagnosis not present

## 2020-11-05 DIAGNOSIS — M4802 Spinal stenosis, cervical region: Secondary | ICD-10-CM | POA: Diagnosis not present

## 2020-11-05 DIAGNOSIS — S4992XA Unspecified injury of left shoulder and upper arm, initial encounter: Secondary | ICD-10-CM | POA: Diagnosis not present

## 2020-11-06 ENCOUNTER — Other Ambulatory Visit: Payer: Self-pay | Admitting: Surgery

## 2020-11-06 DIAGNOSIS — M4802 Spinal stenosis, cervical region: Secondary | ICD-10-CM

## 2020-11-06 DIAGNOSIS — B0223 Postherpetic polyneuropathy: Secondary | ICD-10-CM

## 2020-11-12 DIAGNOSIS — E559 Vitamin D deficiency, unspecified: Secondary | ICD-10-CM | POA: Diagnosis not present

## 2020-11-12 DIAGNOSIS — E038 Other specified hypothyroidism: Secondary | ICD-10-CM | POA: Diagnosis not present

## 2020-11-12 DIAGNOSIS — E538 Deficiency of other specified B group vitamins: Secondary | ICD-10-CM | POA: Diagnosis not present

## 2020-11-12 DIAGNOSIS — B0223 Postherpetic polyneuropathy: Secondary | ICD-10-CM | POA: Diagnosis not present

## 2020-11-12 DIAGNOSIS — R700 Elevated erythrocyte sedimentation rate: Secondary | ICD-10-CM | POA: Diagnosis not present

## 2020-11-12 DIAGNOSIS — Z79899 Other long term (current) drug therapy: Secondary | ICD-10-CM | POA: Diagnosis not present

## 2020-11-12 DIAGNOSIS — E519 Thiamine deficiency, unspecified: Secondary | ICD-10-CM | POA: Diagnosis not present

## 2020-11-12 DIAGNOSIS — R531 Weakness: Secondary | ICD-10-CM | POA: Diagnosis not present

## 2020-11-12 DIAGNOSIS — E531 Pyridoxine deficiency: Secondary | ICD-10-CM | POA: Diagnosis not present

## 2020-11-24 DIAGNOSIS — R531 Weakness: Secondary | ICD-10-CM | POA: Diagnosis not present

## 2020-11-24 DIAGNOSIS — M5000 Cervical disc disorder with myelopathy, unspecified cervical region: Secondary | ICD-10-CM | POA: Diagnosis not present

## 2020-11-24 DIAGNOSIS — M4802 Spinal stenosis, cervical region: Secondary | ICD-10-CM | POA: Diagnosis not present

## 2020-11-24 DIAGNOSIS — M5030 Other cervical disc degeneration, unspecified cervical region: Secondary | ICD-10-CM | POA: Diagnosis not present

## 2020-11-29 IMAGING — CR DG CHEST 2V
2 series · 2 of 2 positions shown · non-contrast
Comparison: 01/31/2019

CLINICAL DATA: Status post CABG

EXAM:
CHEST - 2 VIEW

[w chest pa]
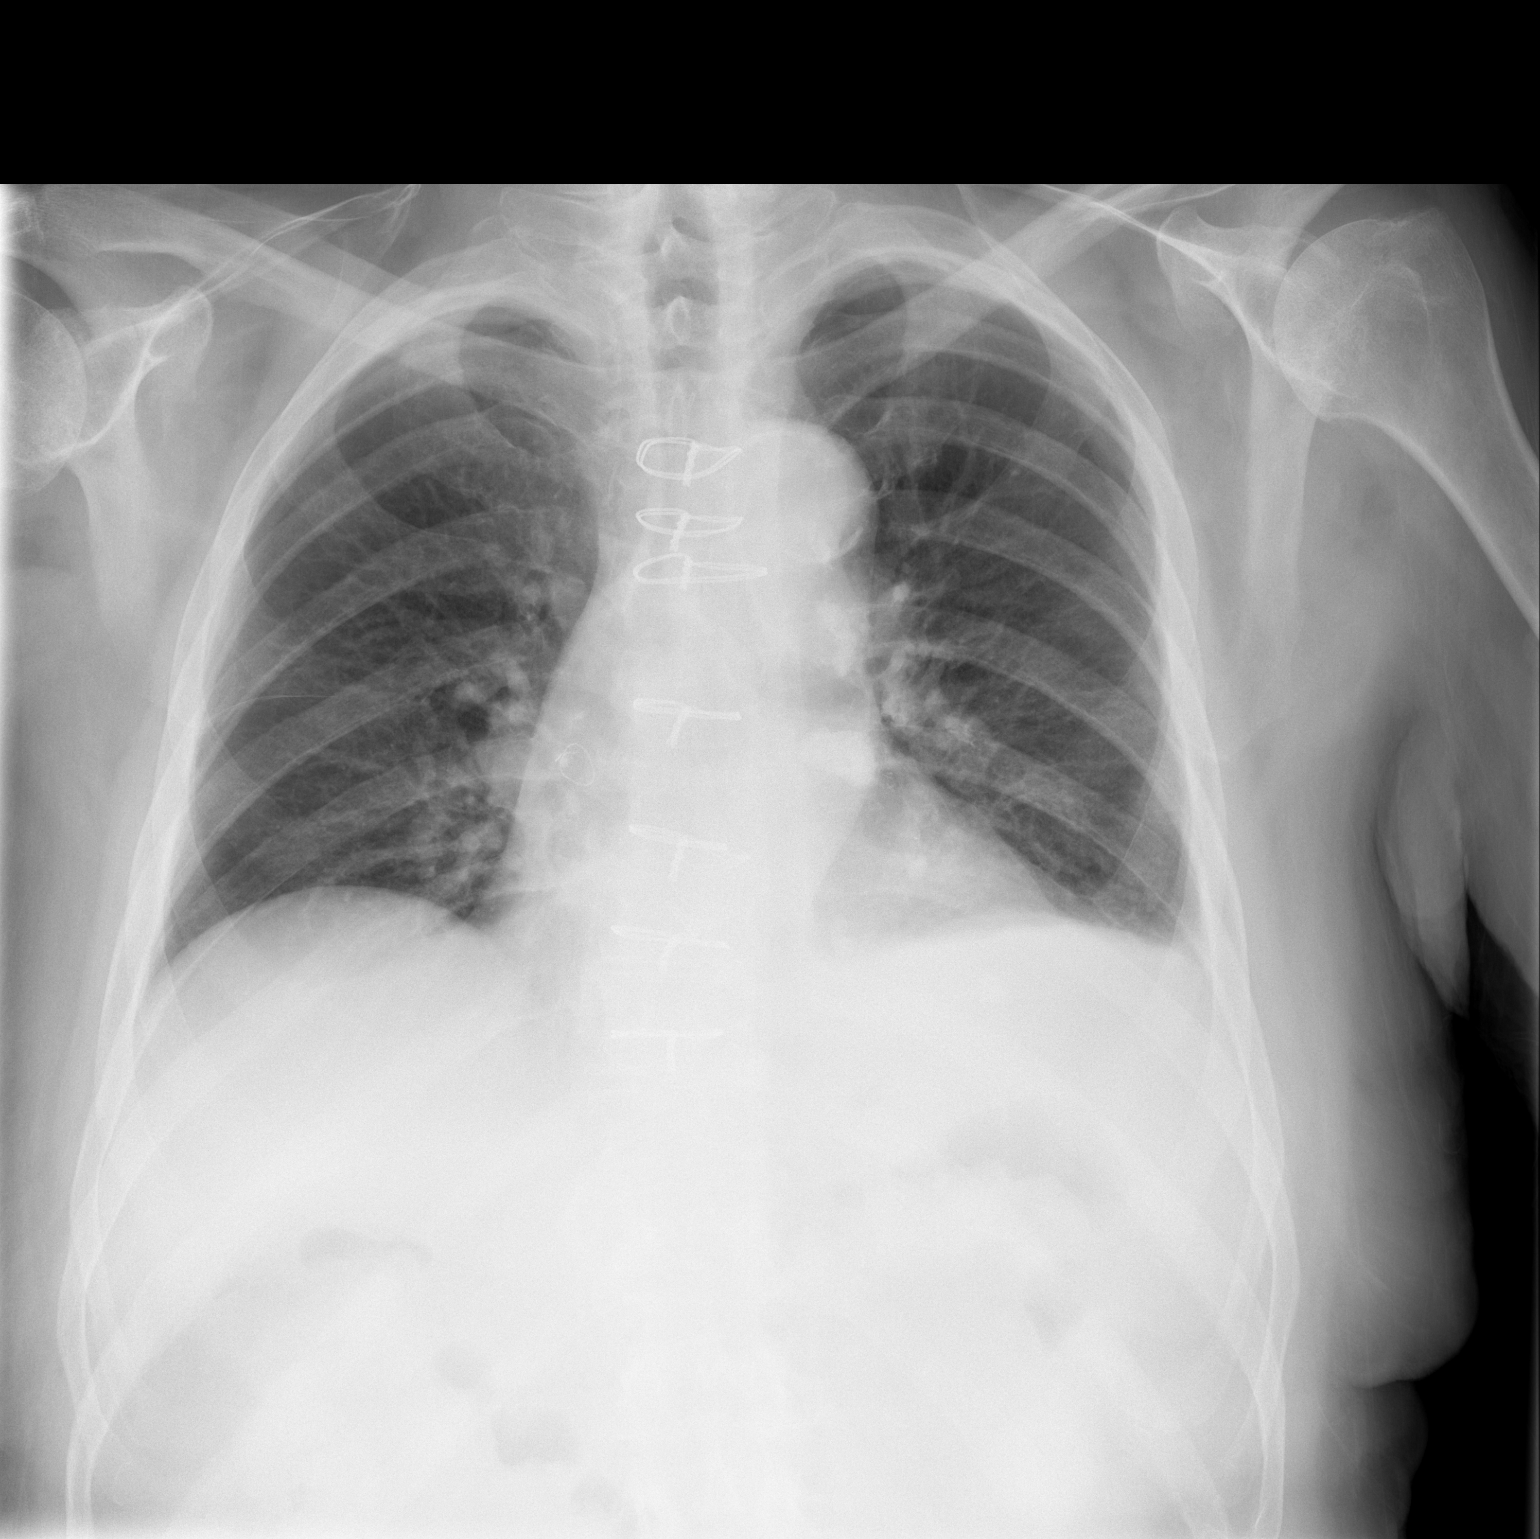

[w chest lat]
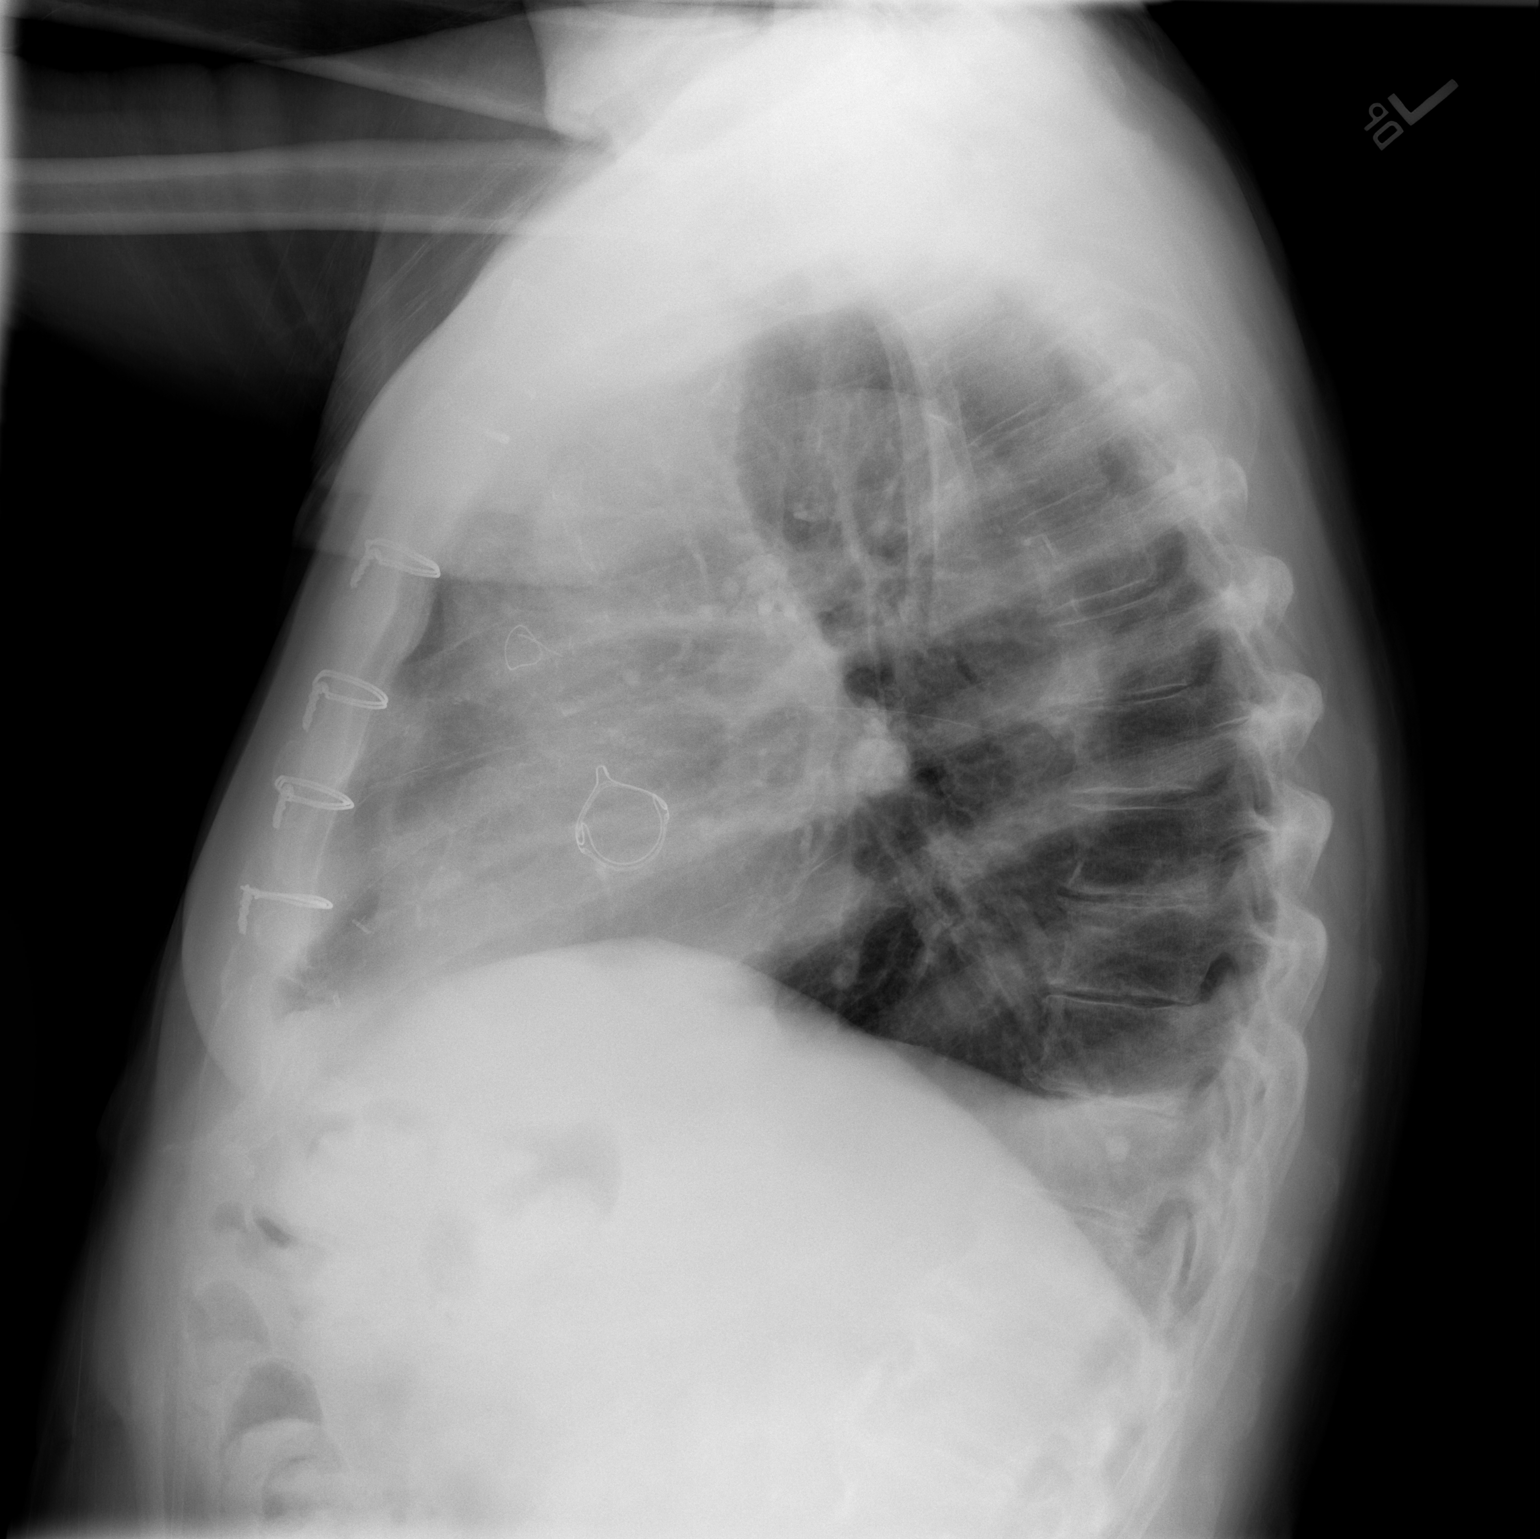

[2 of 2 positions shown; findings below may reference images not displayed]

FINDINGS: Status post median sternotomy. Calcified left hilar lymph nodes.
Unchanged small left pleural effusion with improved atelectasis at
the lung base. Disc degenerative disease of the thoracic spine.
IMPRESSION: Unchanged small left pleural effusion with improved atelectasis at
the left lung base. No new airspace opacity. Status post median
sternotomy.

## 2020-11-30 ENCOUNTER — Other Ambulatory Visit: Payer: Self-pay | Admitting: Physician Assistant

## 2020-12-08 ENCOUNTER — Telehealth (INDEPENDENT_AMBULATORY_CARE_PROVIDER_SITE_OTHER): Payer: Medicare HMO | Admitting: Internal Medicine

## 2020-12-08 ENCOUNTER — Telehealth: Payer: Self-pay

## 2020-12-08 DIAGNOSIS — E538 Deficiency of other specified B group vitamins: Secondary | ICD-10-CM | POA: Diagnosis not present

## 2020-12-08 DIAGNOSIS — N1831 Chronic kidney disease, stage 3a: Secondary | ICD-10-CM | POA: Diagnosis not present

## 2020-12-08 DIAGNOSIS — I10 Essential (primary) hypertension: Secondary | ICD-10-CM

## 2020-12-08 DIAGNOSIS — M1A9XX0 Chronic gout, unspecified, without tophus (tophi): Secondary | ICD-10-CM | POA: Diagnosis not present

## 2020-12-08 DIAGNOSIS — E1159 Type 2 diabetes mellitus with other circulatory complications: Secondary | ICD-10-CM

## 2020-12-08 DIAGNOSIS — R29898 Other symptoms and signs involving the musculoskeletal system: Secondary | ICD-10-CM | POA: Diagnosis not present

## 2020-12-08 MED ORDER — METHYLPREDNISOLONE 4 MG PO TBPK: ORAL_TABLET | ORAL | 0 refills | Status: DC

## 2020-12-08 NOTE — Telephone Encounter (Signed)
Would he be agreeable to do at least a virtual visit.  Given wanting prednisone, I usually do not prescribe this medication without talking to or seeing (especially in pts with diabetes)  see if agreeable - work in today.

## 2020-12-08 NOTE — Progress Notes (Signed)
Patient ID: [name] [name], male   DOB: 07/03/1930, 87 y.o.   MRN: [id number]   Virtual Visit via telephone Note  This visit type was conducted due to national recommendations for restrictions regarding the COVID-19 pandemic (e.g. social distancing).  This format is felt to be most appropriate for this patient at this time.  All issues noted in this document were discussed and addressed.  No physical exam was performed (except for noted visual exam findings with Video Visits).   I connected with [name] by telephone and verified that I am speaking with the correct person using two identifiers. Location patient: home Location provider: work Persons participating in the telephone visit: patient, provider  The limitations, risks, security and privacy concerns of performing an evaluation and management service by telephone and the availability of in person appointments have been discussed.  It has also been discussed with the patient that there may be a patient responsible charge related to this service. The patient expressed understanding and agreed to proceed.   Reason for visit: work in appt  HPI: Work in for foot/toe pain.  He has a history of gout.  States he feels he had a gout flare starting 3 to 4 days ago.  Reports his ankle/foot has been red.  Pain is localized around the base of the great toe.  Feels similar to his previous gout flare.  No known injury or trauma.  Has taken prednisone previously and this worked well for him.  Is diabetic.  Aware that steroids may increase his sugar.  Aware to stay hydrated and to monitor his sugars.  He has also been followed by neurology.  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  MRI C-spine ordered.  Instructed to use his shoulder sling.  Referred to physical therapy.  Receiving B12.  Placed on gabapentin.  Is also taking vitamin D.  States other than his foot he is doing relatively well.  Eating.  No  nausea or vomiting.  No abdominal pain.  Bowels moving.   ROS: See pertinent positives and negatives per HPI.  Past Medical History:  Diagnosis Date   Aortic stenosis    Arthritis    right shoulder   BPH (benign prostatic hypertrophy)    Coronary artery disease    Gout    Heart murmur    History of chicken pox    Hypercholesterolemia    Hypertension    Systolic heart failure (HCC)    ischemic cardiomyopathy   Wears dentures    full upper and lower    Past Surgical History:  Procedure Laterality Date   AORTIC VALVE REPLACEMENT N/A 01/25/2019   Procedure: AORTIC VALVE REPLACEMENT (AVR) 61mm Inspiris valve;  Surgeon: Gaye Pollack, MD;  [facility];  Service: Open Heart Surgery;  Laterality: N/A;   arm surgery     right arm fx s/p "plate insertion"   CARDIAC CATHETERIZATION     CATARACT EXTRACTION W/PHACO Right 11/17/2016   Procedure: CATARACT EXTRACTION PHACO AND INTRAOCULAR LENS PLACEMENT ([place])  Right;  Surgeon: Leandrew Koyanagi, MD;  Location: [place];  Service: Ophthalmology;  Laterality: Right;   CORONARY ARTERY BYPASS GRAFT N/A 01/25/2019   LIMA-LAD and sequential SVG-rPDA-rPL   DENTAL SURGERY     all teeth extracted   RIGHT HEART CATH AND CORONARY ANGIOGRAPHY N/A 01/02/2019   Procedure: RIGHT HEART CATH AND CORONARY ANGIOGRAPHY;  Surgeon: Nelva Bush, MD;  Location: [place] [facility];  Service:  Cardiovascular;  Laterality: N/A;   TEE WITHOUT CARDIOVERSION N/A 01/25/2019   Procedure: TRANSESOPHAGEAL ECHOCARDIOGRAM (TEE);  Surgeon: Gaye Pollack, MD;  Location: [place];  Service: Open Heart Surgery;  Laterality: N/A;    Family History  Problem Relation Age of Onset   Leukemia Father    Congestive Heart Failure Mother    Cancer Daughter        Breast Cancer   Prostate cancer Neg Hx    Colon cancer Neg Hx     SOCIAL HX: Reviewed   Current Outpatient Medications:    allopurinol (ZYLOPRIM) 100 MG tablet, Take 1 tablet (100 mg total) by mouth  daily., Disp: 90 tablet, Rfl: 3   amLODipine (NORVASC) 5 MG tablet, Take 1 tablet (5 mg total) by mouth daily., Disp: 90 tablet, Rfl: 3   aspirin EC 81 MG tablet, Take 1 tablet (81 mg total) by mouth daily., Disp: 90 tablet, Rfl: 0   atorvastatin (LIPITOR) 80 MG tablet, TAKE 1 TABLET EVERY MORNING, Disp: 90 tablet, Rfl: 0   carvedilol (COREG) 6.25 MG tablet, TAKE 1 TABLET BY MOUTH 2 TIMES DAILY WITH A MEAL., Disp: 30 tablet, Rfl: 1   LORazepam (ATIVAN) 1 MG tablet, Takes 1/2 to 1 tablet q day prn, Disp: 30 tablet, Rfl: 0   losartan (COZAAR) 100 MG tablet, Take 1 tablet (100 mg total) by mouth daily., Disp: 90 tablet, Rfl: 3   methylPREDNISolone (MEDROL DOSEPAK) 4 MG TBPK tablet, Medrol dosepack - six day taper.  Take as directed., Disp: 21 tablet, Rfl: 0  EXAM:  GENERAL: alert.  Sounds to be in no acute distress.  Answering questions appropriately.  PSYCH/NEURO: pleasant and cooperative, no obvious depression or anxiety, speech and thought processing grossly intact  ASSESSMENT AND PLAN:  Discussed the following assessment and plan:  Problem List Items Addressed This Visit     CKD (chronic kidney disease) stage 3, GFR 30-59 ml/min (HCC)    Avoid anti-inflammatories given chronic kidney disease.  Will use steroids to treat his presumed gout flare.       Diabetes ([place])    On no medication.  Aware that steroids can elevate his sugar.  Stay hydrated.  Monitor sugars.       Gout    Symptoms appear to be consistent with a gout flare.  Treat with a Medrol Dosepak as directed.  Avoid anti-inflammatories given his chronic kidney disease.  Unable to take colchicine.  Follow-up sugars as outlined.  Instructed him to notify me once this flare was complete and we will increase his allopurinol dose.       Hypertension    Continue losartan, amlodipine and Coreg.  Follow blood pressure.        Return if symptoms worsen or fail to improve, for keep scheduled.   I discussed the assessment  and treatment plan with the patient. The patient was provided an opportunity to ask questions and all were answered. The patient agreed with the plan and demonstrated an understanding of the instructions.   The patient was advised to call back or seek an in-person evaluation if the symptoms worsen or if the condition fails to improve as anticipated.  I provided 22 minutes of non-face-to-face time during this encounter.   Einar Pheasant, MD

## 2020-12-08 NOTE — Telephone Encounter (Signed)
Patient is agreeable to do telephone visit with you but he is on his way to have nerve conduction study done at [facility]. He was wanting to know if it will be ok if he is not ready right at 4:00? Advised that it should be fine but wanted to confirm

## 2020-12-08 NOTE — Telephone Encounter (Signed)
Pt has hx of gout. Was treated for flare in the L knee 09/2020. His now having a flare in his right ankle and right great toe. He is unable to take colchicine so you gave him a medrol dose pack last time he was treated.

## 2020-12-08 NOTE — Telephone Encounter (Signed)
Pt called and states that he needs medication sent in for gout in his right ankle and great toe. He has a flare up and states that he does not need an appt because he has had it long enough to know what it is. He wants med [facility] [place] so it will be local

## 2020-12-09 NOTE — Telephone Encounter (Signed)
Evaluated and treated.  See note.

## 2020-12-14 ENCOUNTER — Encounter: Payer: Self-pay | Admitting: Internal Medicine

## 2020-12-14 NOTE — Assessment & Plan Note (Signed)
Symptoms appear to be consistent with a gout flare.  Treat with a Medrol Dosepak as directed.  Avoid anti-inflammatories given his chronic kidney disease.  Unable to take colchicine.  Follow-up sugars as outlined.  Instructed him to notify me once this flare was complete and we will increase his allopurinol dose.

## 2020-12-14 NOTE — Assessment & Plan Note (Signed)
Avoid anti-inflammatories given chronic kidney disease.  Will use steroids to treat his presumed gout flare.

## 2020-12-14 NOTE — Assessment & Plan Note (Signed)
On no medication.  Aware that steroids can elevate his sugar.  Stay hydrated.  Monitor sugars.

## 2020-12-14 NOTE — Assessment & Plan Note (Signed)
Continue losartan, amlodipine and Coreg.  Follow blood pressure.

## 2020-12-16 DIAGNOSIS — E538 Deficiency of other specified B group vitamins: Secondary | ICD-10-CM | POA: Diagnosis not present

## 2020-12-19 ENCOUNTER — Other Ambulatory Visit: Payer: Self-pay | Admitting: Internal Medicine

## 2020-12-23 DIAGNOSIS — E538 Deficiency of other specified B group vitamins: Secondary | ICD-10-CM | POA: Diagnosis not present

## 2020-12-25 ENCOUNTER — Encounter: Payer: Self-pay | Admitting: Physical Therapy

## 2020-12-25 ENCOUNTER — Other Ambulatory Visit: Payer: Self-pay

## 2020-12-25 ENCOUNTER — Ambulatory Visit: Payer: Medicare HMO | Attending: Neurology

## 2020-12-25 DIAGNOSIS — R293 Abnormal posture: Secondary | ICD-10-CM | POA: Insufficient documentation

## 2020-12-25 DIAGNOSIS — M79602 Pain in left arm: Secondary | ICD-10-CM | POA: Diagnosis not present

## 2020-12-25 DIAGNOSIS — R29898 Other symptoms and signs involving the musculoskeletal system: Secondary | ICD-10-CM | POA: Insufficient documentation

## 2020-12-25 NOTE — Therapy (Signed)
[facility] [address] [place], [place], [address] Phone: [phone]   Fax:  [phone]  Physical Therapy Initial Evaluation   Patient Details  Name: [name] [name] MRN: [id number] Date of Birth: September 13, 1930 Referring Provider (PT): Ray Church, MD   Encounter Date: 12/25/2020   PT End of Session - 12/25/20 1550     Visit Number 1    Number of Visits 16    Date for PT Re-Evaluation 02/19/21    Authorization - Visit Number 1    Authorization - Number of Visits 10    Progress Note Due on Visit 10    PT Start Time 7371    PT Stop Time 0626    PT Time Calculation (min) 57 min    Equipment Utilized During Treatment Other (comment)   Sling   Activity Tolerance Patient tolerated treatment well;Patient limited by pain    Behavior During Therapy [facility] for tasks assessed/performed             Past Medical History:  Diagnosis Date   Aortic stenosis    Arthritis    right shoulder   BPH (benign prostatic hypertrophy)    Coronary artery disease    Gout    Heart murmur    History of chicken pox    Hypercholesterolemia    Hypertension    Systolic heart failure ([place])    ischemic cardiomyopathy   Wears dentures    full upper and lower    Past Surgical History:  Procedure Laterality Date   AORTIC VALVE REPLACEMENT N/A 01/25/2019   Procedure: AORTIC VALVE REPLACEMENT (AVR) 40mm Inspiris valve;  Surgeon: Gaye Pollack, MD;  [facility];  Service: Open Heart Surgery;  Laterality: N/A;   arm surgery     right arm fx s/p "plate insertion"   CARDIAC CATHETERIZATION     CATARACT EXTRACTION W/PHACO Right 11/17/2016   Procedure: CATARACT EXTRACTION PHACO AND INTRAOCULAR LENS PLACEMENT ([place])  Right;  Surgeon: Leandrew Koyanagi, MD;  Location: [place];  Service: Ophthalmology;  Laterality: Right;   CORONARY ARTERY BYPASS GRAFT N/A 01/25/2019   LIMA-LAD and sequential SVG-rPDA-rPL   DENTAL SURGERY      all teeth extracted   RIGHT HEART CATH AND CORONARY ANGIOGRAPHY N/A 01/02/2019   Procedure: RIGHT HEART CATH AND CORONARY ANGIOGRAPHY;  Surgeon: Nelva Bush, MD;  Location: [place] [facility];  Service: Cardiovascular;  Laterality: N/A;   TEE WITHOUT CARDIOVERSION N/A 01/25/2019   Procedure: TRANSESOPHAGEAL ECHOCARDIOGRAM (TEE);  Surgeon: Gaye Pollack, MD;  Location: [place];  Service: Open Heart Surgery;  Laterality: N/A;    Vitals:     Subjective Assessment - 12/25/20 1412     Subjective Pt presents to PT with L shoulder weakness post shingles infection in mid-May. Pt was working in his yard with a leaf rake. Pt felt a sudden onset of discomfort in the L shoulder, did not think of much of it and applied heat that night. Next morning pt woke up with a rash down the lateral arm.    Pertinent History Pt's MRI results were available 11/27/2020 with mild joint changes. Pt reported abnormal EMG studies that was conducted 2-3 weeks ago. Pt is retired from an [employer] job that required traveling. Hobbies include yards work and maintaining his home. Pt is R hand dominate. Pt stated moderate aggravation with current information that was made available to him during his current treatment. Pt requested transparency with all test  and measures as well as prognosis and progression.    Limitations House hold activities   Anything that requries L UE.   How long can you sit comfortably? Deferred to later tx    How long can you stand comfortably? Deferred to later tx    How long can you walk comfortably? Deferred to later tx    Diagnostic tests MRI and EMG    Patient Stated Goals " Get better"    Currently in Pain? Yes    Pain Score 6     Pain Location Shoulder    Pain Orientation Left    Pain Descriptors / Indicators Aching    Pain Type Acute pain    Pain Radiating Towards L lat/post UE    Pain Onset More than a month ago    Pain Frequency Constant    Aggravating Factors  Movement or poor support of  UE    Pain Relieving Factors Sleeping with lidocaine patch, sling, supported pacement in IR againest trunk.    Effect of Pain on Daily Activities Limits use of UE.    Multiple Pain Sites Yes    Pain Score 6    Pain Location Arm    Pain Orientation Left;Lateral    Pain Descriptors / Indicators Shooting    Pain Type Neuropathic pain    Pain Onset More than a month ago    Pain Frequency Intermittent    Aggravating Factors  Movement or poor support of UE    Pain Relieving Factors Sleeping with lidocaine patch, sling, supported pacement in IR againest trunk.    Effect of Pain on Daily Activities Reduced donning/doffing of clothening.                [facility] PT Assessment - 12/25/20 0001       Assessment   Medical Diagnosis L shoulder weakness    Referring Provider (PT) Ray Church, MD    Onset Date/Surgical Date 11/05/20    Hand Dominance Right    Next MD Visit 8/22    Prior Therapy Pt had schedule therapy post heart operation, never completed tx      Precautions   Required Braces or Orthoses Sling;Knee Immobilizer - Left      Balance Screen   Has the patient fallen in the past 6 months No      Greasy residence      Prior Function   Level of Independence Independent with basic ADLs      Sensation   Light Touch Impaired Detail    Light Touch Impaired Details Impaired LUE   C5/C6 light touch deminished LUE compared RUE.   Proprioception Impaired by gross assessment   Mild/mod difficulty with L thumb find test.     ROM / Strength   AROM / PROM / Strength AROM;PROM;Strength      AROM   Overall AROM  Deficits    AROM Assessment Site Shoulder;Cervical    Right/Left Shoulder Left;Right    Right Shoulder Flexion 110 Degrees    Right Shoulder ABduction 110 Degrees    Right Shoulder External Rotation 30 Degrees    Left Shoulder Flexion 0 Degrees    Left Shoulder ABduction 0 Degrees    Left Shoulder External Rotation 0  Degrees    Cervical Flexion 50    Cervical Extension 70    Cervical - Right Side Bend 35    Cervical - Left Side Bend 25    Cervical -  Right Rotation 45    Cervical - Left Rotation 50      PROM   Overall PROM  Deficits    PROM Assessment Site Shoulder    Right/Left Shoulder Right;Left    Right Shoulder Flexion 110 Degrees    Right Shoulder ABduction 110 Degrees    Left Shoulder Flexion 105 Degrees    Left Shoulder ABduction 68 Degrees    Left Shoulder External Rotation 45 Degrees   UE in neutral.     Strength   Overall Strength Deficits    Strength Assessment Site Shoulder;Elbow    Right/Left Shoulder Left    Left Shoulder Flexion 0/5    Left Shoulder ABduction 0/5    Left Shoulder External Rotation 2/5    Right/Left Elbow Left    Left Elbow Flexion 3/5    Left Elbow Extension 3/5      Palpation   Palpation comment Trp upper trap/rhomboid/poste RC L>R      Special Tests    Special Tests Cervical;Laxity/Instability Tests    Cervical Tests Dictraction;Spurling's    Laxity/Instability  Sulcus sign test at 0 degrees;Anterior Apprehension test;Relocation test      Spurling's   Findings Negative    Side Left      Distraction Test   Findngs Negative      Sulcus Sign At 0 Degrees   Findings Positive    Side Left    Comments 2 fingure width      Anterior Apprehension test   Findings Negative    Side Left      Relocation test   Findings Negative    Side Left              HEP sets and reps perform with pt to ensure proper technique for maximal L shoulder muscle activation and safety of movement.   Access Code: [email or web address] URL: [email or web address] Date: 12/25/2020 Prepared by: Dorcas Carrow  Exercises  Seated Shoulder Flexion AAROM with Pulley Behind - 2 x daily - 5 x weekly - 1 sets - 20 reps - 5 sec hold Seated Shoulder Abduction AAROM with Pulley Behind - 2 x daily - 5 x weekly - 1 sets - 20 reps - 5 sec hold Supine Elbow Flexion  Extension AROM - 2 x daily - 5 x weekly - 1 sets - 20 reps   Special Test Assessment:  Screen:  Cervical ROM: R/L   Lat flex: 35/25  Flex: 50  Extension: 70  Rotation: 45/50  shoulder AROM and PROM (R/L)  110 abd and flexion/   113 flexion and110 abduction PROM/ 105 flexion/68 abduction  45 ER AROM on L shoulder   22 deg ER AROM 30 PROM Sensation UQNS: Normal to LT except at C5-C6 dermatomes MMT   Shoulder elevation 5/5  Shoulder Abd: 5/0  Elbow flex: 3/5  Elbow extension: 3/5 Thumb find proprioception test: Mild deficits when finding thumb (RUE grabbing L thumb  Radial nerve tension test + at 50 deg of shoulder abd, sx reduced with ipsilateral L cervical lat flex. Sx reproducted at 68 deg. Cervical Distraction (-)  GH apprehension/relocation: (-) DTR: deferred to second tx  Hoffman's: deferred to second tx Spurling's: negative  Sulcus: 2 fingers on L shoulder. No sulcus noted on R GHJ.     PT Education - 12/25/20 1548     Education Details Pt education of possible POC and prognosis. HEP importance and proper dosage.    Person(s) Educated Patient    Methods Explanation;Demonstration;Tactile cues;Verbal cues;Handout  Comprehension Verbalized understanding;Returned demonstration                 PT Long Term Goals - 12/25/20 1625       PT LONG TERM GOAL #1   Title Pt will improve FOTO score to predicted value of 59 to improve independence with ADLs    Baseline 25    Time 8    Period Weeks    Status New    Target Date 02/19/21      PT LONG TERM GOAL #2   Title Pt will improve L shoulder strengt tested via MMT Flex/abd to 2/5 to promote increase LUE use during grooming and feeding.    Baseline 0/5    Time 8    Period Weeks    Status New    Target Date 02/19/21      PT LONG TERM GOAL #3   Title Pt will improve L PROM equal to R to maintain glenohumeral joint health for future functional strength gains.    Baseline L flex105  Abd 68  R flexion/abd  110    Time 8    Period Weeks    Status New    Target Date 02/19/21      PT LONG TERM GOAL #4   Title Pt reports ability to don/doff pants and shirts MI without spouse assist to improve dressing ADL's.    Baseline Spouse current Mod-Max assist during dressing.    Time 8    Period Weeks    Status New    Target Date 02/19/21                   Plan - 12/25/20 1620     Clinical Impression Statement Pt is a 87 y/o male presenting to clinic with chief complaints L shoulder weakness and pain due to  post-herpetic polyneuropathy with sudden onset after yard raking in mid-May. Pt MRI results where available 10/27/20 displaying minor retrolisthesis of c5/c6 and intervertebral disc bulging c3-c7. Pt reported EMG 2-3 weeks ago with abnormal finding of LUE compared to RUE. Upon examination, patient demonstrates deficits in thoracic/shoulder posture, bilat UE ROM, LUE strength due to poor neuro input, Cervical spine ROM, UE proprioception. Deficits evidenced by LUE MMT: L Shoulder abd/flex 0/5, Elbow ext/flex 3/5 ROM: AROM R flex/ext 110, PROM R=R AROM, unable L shoulder flex/ABD AROM, L PROM 105 flex 68 abd. C5/C6 light touch diminished on L side compared to R. Patient's responses on FOTO outcome measures (25) with a predicted improvement of 59. Pt presents as a high complexity case with fair prognosis due to lack of neurological imput to L shoulder and arm muscles with virus-based input as well as extensive past medical history and age. Pt will benefit from skilled PT 2x week for 8 weeks to progress POC for maximal independence with ADLs for greater safe quality of life.    Personal Factors and Comorbidities Age;Comorbidity 3+;Time since onset of injury/illness/exacerbation    Examination-Activity Limitations Bed Mobility;Carry;Lift;Dressing;Reach Overhead;Self Feeding;Transfers    Examination-Participation Restrictions Yard Work    Stability/Clinical Decision Making Unstable/Unpredictable     Clinical Decision Making High    Rehab Potential Fair    PT Frequency 2x / week    PT Duration 8 weeks    PT Treatment/Interventions ADLs/Self Care Home Management;Aquatic Therapy;Biofeedback;Cryotherapy;Electrical Stimulation;Moist Heat;Traction;Ultrasound;Fluidtherapy;Contrast Bath;Gait training;Stair training;Functional mobility training;Therapeutic activities;Therapeutic exercise;Balance training;Neuromuscular re-education;Patient/family education;Manual techniques;Passive range of motion;Dry needling;Energy conservation;Splinting;Taping;Joint Manipulations    PT Next Visit Plan Baseline for QuickDash, Hoffmans sign, and  wrist MMT. check adherence of HEP    PT Home Exercise Plan Access Code: [email or web address]    Consulted and Agree with Plan of Care Patient             Patient will benefit from skilled therapeutic intervention in order to improve the following deficits and impairments:  Decreased activity tolerance, Decreased coordination, Decreased strength, Impaired UE functional use, Pain, Postural dysfunction, Improper body mechanics, Decreased range of motion, Decreased endurance, Decreased mobility, Decreased skin integrity, Hypomobility, Impaired sensation  Visit Diagnosis: Shoulder weakness  Abnormal posture  Pain of left upper extremity     Problem List Patient Active Problem List   Diagnosis Date Noted   Shingles 10/12/2020   Shoulder pain, left 10/12/2020   AKI (acute kidney injury) ([place]) 10/20/2019   Dilatation of thoracic aorta ([place]) 10/19/2019   Pedal edema 07/11/2019   Daytime somnolence 06/02/2019   Coronary artery disease involving native coronary artery of native heart without angina pectoris 02/08/2019   Chronic HFrEF (heart failure with reduced ejection fraction) ([place]) 02/08/2019   S/P aortic valve replacement 02/08/2019   Postoperative anemia 02/08/2019   S/P CABG x 3 40/76/8088   Acute systolic congestive heart failure ([place]) 12/21/2018   Biventricular  failure ([place]) 12/21/2018   Nonrheumatic aortic valve stenosis 12/21/2018   SOB (shortness of breath) 12/11/2018   Hypoxia 12/11/2018   Shortness of breath 12/11/2018   Right shoulder pain 05/08/2015   Health care maintenance 11/12/2014   BMI 32.0-32.9,adult 06/18/2014   Elevated PSA 02/17/2014   CKD (chronic kidney disease) stage 3, GFR 30-59 ml/min (HCC) 05/21/2012   Gout 05/21/2012   Hypertension 05/02/2012   Hyperlipidemia LDL goal <70 05/02/2012   Diabetes ([place]) 05/02/2012    Fara Olden SPT  [place]. Fairly IV, PT, DPT Physical Therapist- [facility]  12/25/2020, 4:57 PM  [place] [name] [address] [place], [place], [address] Phone: [phone]   Fax:  [phone]  Name: [name] [name] MRN: [id number] Date of Birth: 1931-03-21

## 2020-12-29 ENCOUNTER — Ambulatory Visit: Payer: Medicare HMO | Admitting: Physical Therapy

## 2020-12-29 DIAGNOSIS — E538 Deficiency of other specified B group vitamins: Secondary | ICD-10-CM | POA: Diagnosis not present

## 2020-12-31 ENCOUNTER — Other Ambulatory Visit: Payer: Self-pay

## 2020-12-31 ENCOUNTER — Ambulatory Visit: Payer: Medicare HMO | Admitting: Physical Therapy

## 2020-12-31 ENCOUNTER — Encounter: Payer: Self-pay | Admitting: Physical Therapy

## 2020-12-31 DIAGNOSIS — R29898 Other symptoms and signs involving the musculoskeletal system: Secondary | ICD-10-CM

## 2020-12-31 DIAGNOSIS — M79602 Pain in left arm: Secondary | ICD-10-CM

## 2020-12-31 DIAGNOSIS — R293 Abnormal posture: Secondary | ICD-10-CM | POA: Diagnosis not present

## 2020-12-31 NOTE — Therapy (Signed)
[facility] [address] [place], [place], [address] Phone: [phone]   Fax:  [phone]  Physical Therapy Treatment  Patient Details  Name: [name] [name] MRN: [id number] Date of Birth: 05-18-1931 Referring Provider (PT): Ray Church, MD   Encounter Date: 12/31/2020   PT End of Session - 12/31/20 1217     Visit Number 2    Number of Visits 16    Date for PT Re-Evaluation 02/19/21    Authorization - Visit Number 2    Authorization - Number of Visits 10    Progress Note Due on Visit 10    PT Start Time 1116    PT Stop Time 2595    PT Time Calculation (min) 48 min    Equipment Utilized During Treatment Other (comment)   TENS   Activity Tolerance Patient tolerated treatment well;Patient limited by pain    Behavior During Therapy [facility] for tasks assessed/performed             Past Medical History:  Diagnosis Date   Aortic stenosis    Arthritis    right shoulder   BPH (benign prostatic hypertrophy)    Coronary artery disease    Gout    Heart murmur    History of chicken pox    Hypercholesterolemia    Hypertension    Systolic heart failure ([place])    ischemic cardiomyopathy   Wears dentures    full upper and lower    Past Surgical History:  Procedure Laterality Date   AORTIC VALVE REPLACEMENT N/A 01/25/2019   Procedure: AORTIC VALVE REPLACEMENT (AVR) 45mm Inspiris valve;  Surgeon: Gaye Pollack, MD;  [facility];  Service: Open Heart Surgery;  Laterality: N/A;   arm surgery     right arm fx s/p "plate insertion"   CARDIAC CATHETERIZATION     CATARACT EXTRACTION W/PHACO Right 11/17/2016   Procedure: CATARACT EXTRACTION PHACO AND INTRAOCULAR LENS PLACEMENT ([place])  Right;  Surgeon: Leandrew Koyanagi, MD;  Location: [place];  Service: Ophthalmology;  Laterality: Right;   CORONARY ARTERY BYPASS GRAFT N/A 01/25/2019   LIMA-LAD and sequential SVG-rPDA-rPL   DENTAL SURGERY     all teeth  extracted   RIGHT HEART CATH AND CORONARY ANGIOGRAPHY N/A 01/02/2019   Procedure: RIGHT HEART CATH AND CORONARY ANGIOGRAPHY;  Surgeon: Nelva Bush, MD;  Location: [place] CV LAB;  Service: Cardiovascular;  Laterality: N/A;   TEE WITHOUT CARDIOVERSION N/A 01/25/2019   Procedure: TRANSESOPHAGEAL ECHOCARDIOGRAM (TEE);  Surgeon: Gaye Pollack, MD;  Location: [place];  Service: Open Heart Surgery;  Laterality: N/A;    There were no vitals filed for this visit.   Subjective Assessment - 12/31/20 1129     Subjective Pt present to tx with 0/10 pain and no sling. Pt expects increase pain and need for the sling as the day progresses. Pt has a question about the shoulder abduction exercise with pulleys, he states that its painful and does not think he is doing it right.    Pertinent History Pt's MRI results were available 11/27/2020 with mild joint changes. Pt reported abnormal EMG studies that was conducted 2-3 weeks ago. Pt is retired from an [employer] job that required traveling. Hobbies include yards work and maintaining his home. Pt is R hand dominate. Pt stated moderate aggravation with current information that was made available to him during his current treatment. Pt requested transparency with all test and measures as well as prognosis and  progression.    Limitations House hold activities   Anything that requries L UE.   How long can you sit comfortably? Deferred to later tx    How long can you stand comfortably? Deferred to later tx    How long can you walk comfortably? Deferred to later tx    Diagnostic tests MRI and EMG    Patient Stated Goals " Get better"    Currently in Pain? No/denies    Pain Onset More than a month ago    Pain Onset More than a month ago             Therapeutic Exercise:  *Seated exercises done in chair with pillow support the back and L arm for protection of L GH joint.*   Seated Pulleys flex/abd x30 with verbal and contact cueing needed for abd for proper  mechanics for proper shoulder movement within a pain free range.   Seated L elbow flexion 2x20, wrist ext/flex radial deviation x20. Verbal and contact cueing needed with CGA-MIN assist to ensure movement in a correct plane that is safe for the shoulder.   Supine Shoulder abd Min A with FES 10 sec on/30 sec off for 5 mins  Supine shoulder press with dowel with FES 10 sec on 30 sec of for 10 mins. CGA assist to ensure safety of shoulder stability  during the 10 sec on phase. X10 without FES to test/retest use of TENS   Shirt Donning/doffing with SBA for doff Min A for donning. Verbal and contact cueing to ensure safety of movement.      Electric Stim Attended:   EMPI  Continuum: 2 channel parallel set up with 2''/2'' pads located supraspinous, ant deltoid, mid deltoid, and infraspinatus. On/off time: 10sec/30sec. Duration 10 mins. Rate:  35 Hz.  Pulse width: 300 micro-sec. Asymmetric wave from. 3 sec ramp on/off. Intensity 48. Pt skin displayed minor redness that reduced to baseline within 5 mins post tx.   PT assist with AAROM ex. (See above)          PT Education - 12/31/20 1219     Education Details Pt educated on therapeutic benefit of functional electrial stimuluation to encourage muscle activation. Pt was education on completely pulleys within a pain-free/limited range. Pt educated on safe sleeping posture to decrease change of dislocation.    Person(s) Educated Patient    Methods Explanation;Demonstration;Tactile cues;Verbal cues    Comprehension Verbalized understanding;Returned demonstration                 PT Long Term Goals - 12/25/20 1625       PT LONG TERM GOAL #1   Title Pt will improve FOTO score to predicted value of 59 to improve independence with ADLs    Baseline 25    Time 8    Period Weeks    Status New    Target Date 02/19/21      PT LONG TERM GOAL #2   Title Pt will improve L shoulder strengt tested via MMT Flex/abd to 2/5 to promote increase  LUE use during grooming and feeding.    Baseline 0/5    Time 8    Period Weeks    Status New    Target Date 02/19/21      PT LONG TERM GOAL #3   Title Pt will improve L PROM equal to R to maintain glenohumeral joint health for future functional strength gains.    Baseline L flex105  Abd 68  R  flexion/abd 110    Time 8    Period Weeks    Status New    Target Date 02/19/21      PT LONG TERM GOAL #4   Title Pt reports ability to don/doff pants and shirts MI without spouse assist to improve dressing ADL's.    Baseline Spouse current Mod-Max assist during dressing.    Time 8    Period Weeks    Status New    Target Date 02/19/21                   Plan - 12/31/20 1221     Clinical Impression Statement Pt displayed better understanding and form during shoulder abd with pulley's after verbal and contact cueing. Pt requires SBA-Min A of all shoulder movements conducted during tx due to poor voluntary control of the L shoulder. Pt stated increased difficulty with supine dowel push up without FES compared to with estim. Pt also stated decreased "heaviness" post FES compared to before. Decrease palpable sulcus sign post FES compared to before (1.5 finger width).  Pt. will continue to benefit from skilled physical therapy to progress POC to address remaining deficits to facilitate maximum functional capacity for optimal personal health and wellness for ADLs.    Personal Factors and Comorbidities Age;Comorbidity 3+;Time since onset of injury/illness/exacerbation    Examination-Activity Limitations Bed Mobility;Carry;Lift;Dressing;Reach Overhead;Self Feeding;Transfers    Examination-Participation Restrictions Yard Work    Stability/Clinical Decision Making Unstable/Unpredictable    Clinical Decision Making High    Rehab Potential Fair    PT Frequency 2x / week    PT Duration 8 weeks    PT Treatment/Interventions ADLs/Self Care Home Management;Aquatic  Therapy;Biofeedback;Cryotherapy;Electrical Stimulation;Moist Heat;Traction;Ultrasound;Fluidtherapy;Contrast Bath;Gait training;Stair training;Functional mobility training;Therapeutic activities;Therapeutic exercise;Balance training;Neuromuscular re-education;Patient/family education;Manual techniques;Passive range of motion;Dry needling;Energy conservation;Splinting;Taping;Joint Manipulations    PT Next Visit Plan Hoffmans sign, Wrist MMT, reassess soreness within 48 hrs of tx for proper tissue loading.    PT Home Exercise Plan Access Code: [email or web address]    Consulted and Agree with Plan of Care Patient             Patient will benefit from skilled therapeutic intervention in order to improve the following deficits and impairments:  Decreased activity tolerance, Decreased coordination, Decreased strength, Impaired UE functional use, Pain, Postural dysfunction, Improper body mechanics, Decreased range of motion, Decreased endurance, Decreased mobility, Decreased skin integrity, Hypomobility, Impaired sensation  Visit Diagnosis: Shoulder weakness  Pain of left upper extremity  Abnormal posture     Problem List Patient Active Problem List   Diagnosis Date Noted   Shingles 10/12/2020   Shoulder pain, left 10/12/2020   AKI (acute kidney injury) ([place]) 10/20/2019   Dilatation of thoracic aorta ([place]) 10/19/2019   Pedal edema 07/11/2019   Daytime somnolence 06/02/2019   Coronary artery disease involving native coronary artery of native heart without angina pectoris 02/08/2019   Chronic HFrEF (heart failure with reduced ejection fraction) ([place]) 02/08/2019   S/P aortic valve replacement 02/08/2019   Postoperative anemia 02/08/2019   S/P CABG x 3 08/65/7846   Acute systolic congestive heart failure ([place]) 12/21/2018   Biventricular failure ([place]) 12/21/2018   Nonrheumatic aortic valve stenosis 12/21/2018   SOB (shortness of breath) 12/11/2018   Hypoxia 12/11/2018   Shortness of breath  12/11/2018   Right shoulder pain 05/08/2015   Health care maintenance 11/12/2014   BMI 32.0-32.9,adult 06/18/2014   Elevated PSA 02/17/2014   CKD (chronic kidney disease) stage 3, GFR 30-59 ml/min (HCC) 05/21/2012  Gout 05/21/2012   Hypertension 05/02/2012   Hyperlipidemia LDL goal <70 05/02/2012   Diabetes ([place]) 05/02/2012   Pura Spice, PT, DPT # [id number] Fara Olden SPT 12/31/2020, 1:33 PM   [facility] [address] [place], [place], [address] Phone: [phone]   Fax:  [phone]  Name: [name] [name] MRN: [id number] Date of Birth: [birth year]

## 2021-01-05 ENCOUNTER — Other Ambulatory Visit: Payer: Self-pay

## 2021-01-05 ENCOUNTER — Ambulatory Visit: Payer: Medicare HMO | Admitting: Physical Therapy

## 2021-01-05 DIAGNOSIS — R293 Abnormal posture: Secondary | ICD-10-CM | POA: Diagnosis not present

## 2021-01-05 DIAGNOSIS — R29898 Other symptoms and signs involving the musculoskeletal system: Secondary | ICD-10-CM

## 2021-01-05 DIAGNOSIS — M79602 Pain in left arm: Secondary | ICD-10-CM

## 2021-01-06 ENCOUNTER — Encounter: Payer: Self-pay | Admitting: Physical Therapy

## 2021-01-06 NOTE — Therapy (Signed)
[facility] [address]. [place], [place], [address] Phone: [phone]   Fax:  [phone]  Physical Therapy Treatment  Patient Details  Name: [name] [name] MRN: [id number] Date of Birth: [birth year] Referring Provider (PT): Ray Church, MD   Encounter Date: 01/05/2021   PT End of Session - 01/06/21 0803     Visit Number 3    Number of Visits 16    Date for PT Re-Evaluation 02/19/21    Authorization - Visit Number 3    Authorization - Number of Visits 10    Progress Note Due on Visit 10    PT Start Time 4008    PT Stop Time 1429    PT Time Calculation (min) 48 min    Equipment Utilized During Treatment Other (comment)   TENS   Activity Tolerance Patient tolerated treatment well;Patient limited by pain    Behavior During Therapy [facility] for tasks assessed/performed             Past Medical History:  Diagnosis Date   Aortic stenosis    Arthritis    right shoulder   BPH (benign prostatic hypertrophy)    Coronary artery disease    Gout    Heart murmur    History of chicken pox    Hypercholesterolemia    Hypertension    Systolic heart failure ([place])    ischemic cardiomyopathy   Wears dentures    full upper and lower    Past Surgical History:  Procedure Laterality Date   AORTIC VALVE REPLACEMENT N/A 01/25/2019   Procedure: AORTIC VALVE REPLACEMENT (AVR) 43mm Inspiris valve;  Surgeon: Gaye Pollack, MD;  [facility];  Service: Open Heart Surgery;  Laterality: N/A;   arm surgery     right arm fx s/p "plate insertion"   CARDIAC CATHETERIZATION     CATARACT EXTRACTION W/PHACO Right 11/17/2016   Procedure: CATARACT EXTRACTION PHACO AND INTRAOCULAR LENS PLACEMENT ([place])  Right;  Surgeon: Leandrew Koyanagi, MD;  Location: [place];  Service: Ophthalmology;  Laterality: Right;   CORONARY ARTERY BYPASS GRAFT N/A 01/25/2019   LIMA-LAD and sequential SVG-rPDA-rPL   DENTAL SURGERY     all teeth  extracted   RIGHT HEART CATH AND CORONARY ANGIOGRAPHY N/A 01/02/2019   Procedure: RIGHT HEART CATH AND CORONARY ANGIOGRAPHY;  Surgeon: Nelva Bush, MD;  Location: [place] [facility];  Service: Cardiovascular;  Laterality: N/A;   TEE WITHOUT CARDIOVERSION N/A 01/25/2019   Procedure: TRANSESOPHAGEAL ECHOCARDIOGRAM (TEE);  Surgeon: Gaye Pollack, MD;  Location: [place];  Service: Open Heart Surgery;  Laterality: N/A;    There were no vitals filed for this visit.   Subjective Assessment - 01/05/21 1339     Subjective Pt present to tx with 5/10 L shoulder pain and no sling at the start of tx. Pt states that he doesn't wear his sling to PT tx, however, he does wear it during other community outing to protect the shoulder. Pt asked if he can use lidocaine patches on he L shoulder.    Pertinent History Pt's MRI results were available 11/27/2020 with mild joint changes. Pt reported abnormal EMG studies that was conducted 2-3 weeks ago. Pt is retired from an [employer] job that required traveling. Hobbies include yards work and maintaining his home. Pt is R hand dominate. Pt stated moderate aggravation with current information that was made available to him during his current treatment. Pt requested transparency with all test and measures  as well as prognosis and progression.    Limitations House hold activities   Anything that requries L UE.   How long can you sit comfortably? Deferred to later tx    How long can you stand comfortably? Deferred to later tx    How long can you walk comfortably? Deferred to later tx    Diagnostic tests MRI and EMG    Patient Stated Goals " Get better"    Currently in Pain? Yes    Pain Score 6     Pain Location Shoulder    Pain Orientation Left    Pain Descriptors / Indicators Aching    Pain Type Acute pain    Pain Radiating Towards L lat/post UE    Pain Onset More than a month ago    Pain Onset More than a month ago                Therapeutic Exercise:    *Seated exercises done in chair with pillow support the back and L arm for protection of L GH joint.*    Seated Pulleys flex x30 with verbal and contact cueing needed for abd for proper mechanics for proper shoulder movement within a pain free range.   Seated L elbow flexion 2x20, wrist ext/flex radial deviation x20. Verbal and contact cueing needed with CGA-MIN assist to ensure movement in a correct plane that is safe for the shoulder.     Supine shoulder press and bilat shoulder flexion with dowel with FES 10 sec on 30 sec of for 10 min each movement. CGA assist to ensure safety of shoulder stability  during the 10 sec on phase.     Shirt Donning/doffing with SBA for doff Min A for donning. Verbal and contact cueing to ensure safety of movement.          Electric Stim Unattended:   EMPI  Continuum: 2 channel parallel set up with 2''/2'' pads located supraspinous, ant deltoid, mid deltoid, and infraspinatus. On/off time: 10sec/30sec. Duration 20 mins. Rate:  35 Hz.  Pulse width: 300 micro-sec. Asymmetric wave from. 3 sec ramp on/off. Intensity 30. Pt skin displayed minor redness that reduced to baseline within 5 mins post tx.   PT assist with AAROM ex. (See above)             PT Education - 01/05/21 1401     Education Details Pt educated on continued TENs benefit to improve L shoulder muscle firing.    Person(s) Educated Patient    Methods Explanation;Demonstration;Tactile cues;Verbal cues    Comprehension Verbalized understanding;Returned demonstration                 PT Long Term Goals - 12/25/20 1625       PT LONG TERM GOAL #1   Title Pt will improve FOTO score to predicted value of 59 to improve independence with ADLs    Baseline 25    Time 8    Period Weeks    Status New    Target Date 02/19/21      PT LONG TERM GOAL #2   Title Pt will improve L shoulder strengt tested via MMT Flex/abd to 2/5 to promote increase LUE use during grooming and feeding.     Baseline 0/5    Time 8    Period Weeks    Status New    Target Date 02/19/21      PT LONG TERM GOAL #3   Title Pt will improve L PROM  equal to R to maintain glenohumeral joint health for future functional strength gains.    Baseline L flex105  Abd 68  R flexion/abd 110    Time 8    Period Weeks    Status New    Target Date 02/19/21      PT LONG TERM GOAL #4   Title Pt reports ability to don/doff pants and shirts MI without spouse assist to improve dressing ADL's.    Baseline Spouse current Mod-Max assist during dressing.    Time 8    Period Weeks    Status New    Target Date 02/19/21                   Plan - 01/05/21 1412     Clinical Impression Statement Observation: Bilat axillary rash, pt stated that probable was the decreased ability to dry body after a shower. Pt was instructed to inform PCP of rash on 7/21. Pt tolerated FES to the L shoulder without adverse skin reaction and increase supraspinatus,deltoid, and infraspinatus muscle firing with dowel AAROM UE exercises. Pt continues to display poor motor stimulation of the C5-C6 motor nerves. Pt. will continue to benefit from skilled physical therapy to progress POC to address remaining deficits to facilitate maximum functional capacity for optimal personal health and wellness for ADLs.    Personal Factors and Comorbidities Age;Comorbidity 3+;Time since onset of injury/illness/exacerbation    Examination-Activity Limitations Bed Mobility;Carry;Lift;Dressing;Reach Overhead;Self Feeding;Transfers    Examination-Participation Restrictions Yard Work    Stability/Clinical Decision Making Unstable/Unpredictable    Clinical Decision Making Moderate    Rehab Potential Fair    PT Frequency 2x / week    PT Duration 8 weeks    PT Treatment/Interventions ADLs/Self Care Home Management;Aquatic Therapy;Biofeedback;Cryotherapy;Electrical Stimulation;Moist Heat;Traction;Ultrasound;Fluidtherapy;Contrast Bath;Gait training;Stair  training;Functional mobility training;Therapeutic activities;Therapeutic exercise;Balance training;Neuromuscular re-education;Patient/family education;Manual techniques;Passive range of motion;Dry needling;Energy conservation;Splinting;Taping;Joint Manipulations    PT Next Visit Plan Hoffmans sign, Wrist MMT, reassess soreness within 48 hrs of tx for proper tissue loading.    PT Home Exercise Plan Access Code: [email or web address]    Consulted and Agree with Plan of Care Patient             Patient will benefit from skilled therapeutic intervention in order to improve the following deficits and impairments:  Decreased activity tolerance, Decreased coordination, Decreased strength, Impaired UE functional use, Pain, Postural dysfunction, Improper body mechanics, Decreased range of motion, Decreased endurance, Decreased mobility, Decreased skin integrity, Hypomobility, Impaired sensation  Visit Diagnosis: Shoulder weakness  Pain of left upper extremity  Abnormal posture     Problem List Patient Active Problem List   Diagnosis Date Noted   Shingles 10/12/2020   Shoulder pain, left 10/12/2020   AKI (acute kidney injury) ([place]) 10/20/2019   Dilatation of thoracic aorta ([place]) 10/19/2019   Pedal edema 07/11/2019   Daytime somnolence 06/02/2019   Coronary artery disease involving native coronary artery of native heart without angina pectoris 02/08/2019   Chronic HFrEF (heart failure with reduced ejection fraction) ([place]) 02/08/2019   S/P aortic valve replacement 02/08/2019   Postoperative anemia 02/08/2019   S/P CABG x 3 59/93/5701   Acute systolic congestive heart failure ([place]) 12/21/2018   Biventricular failure ([place]) 12/21/2018   Nonrheumatic aortic valve stenosis 12/21/2018   SOB (shortness of breath) 12/11/2018   Hypoxia 12/11/2018   Shortness of breath 12/11/2018   Right shoulder pain 05/08/2015   Health care maintenance 11/12/2014   BMI 32.0-32.9,adult 06/18/2014   Elevated PSA  02/17/2014  CKD (chronic kidney disease) stage 3, GFR 30-59 ml/min (HCC) 05/21/2012   Gout 05/21/2012   Hypertension 05/02/2012   Hyperlipidemia LDL goal <70 05/02/2012   Diabetes ([place]) 05/02/2012   Pura Spice, PT, DPT # [id number] Fara Olden SPT 01/06/2021, 9:10 AM  [facility] [address] [place], [place], [address] Phone: [phone]   Fax:  [phone]  Name: [name] [name] MRN: [id number] Date of Birth: 1931-04-14

## 2021-01-07 ENCOUNTER — Other Ambulatory Visit: Payer: Self-pay | Admitting: Internal Medicine

## 2021-01-07 ENCOUNTER — Other Ambulatory Visit: Payer: Self-pay

## 2021-01-07 ENCOUNTER — Ambulatory Visit: Payer: Medicare HMO | Admitting: Physical Therapy

## 2021-01-07 ENCOUNTER — Encounter: Payer: Self-pay | Admitting: Physical Therapy

## 2021-01-07 DIAGNOSIS — R293 Abnormal posture: Secondary | ICD-10-CM | POA: Diagnosis not present

## 2021-01-07 DIAGNOSIS — M79602 Pain in left arm: Secondary | ICD-10-CM

## 2021-01-07 DIAGNOSIS — R29898 Other symptoms and signs involving the musculoskeletal system: Secondary | ICD-10-CM

## 2021-01-07 NOTE — Therapy (Signed)
[facility] [address] [place], [place], [address] Phone: [phone]   Fax:  [phone]  Physical Therapy Treatment  Patient Details  Name: [name] [name] MRN: [id number] Date of Birth: Aug 29, 1930 Referring Provider (PT): Ray Church, MD   Encounter Date: 01/07/2021   PT End of Session - 01/07/21 1216     Visit Number 4    Number of Visits 16    Date for PT Re-Evaluation 02/19/21    Authorization - Visit Number 4    Authorization - Number of Visits 10    Progress Note Due on Visit 10    PT Start Time 1116    PT Stop Time 1206    PT Time Calculation (min) 50 min    Equipment Utilized During Treatment Other (comment)   TENS   Activity Tolerance Patient tolerated treatment well;Patient limited by pain    Behavior During Therapy [facility] for tasks assessed/performed             Past Medical History:  Diagnosis Date   Aortic stenosis    Arthritis    right shoulder   BPH (benign prostatic hypertrophy)    Coronary artery disease    Gout    Heart murmur    History of chicken pox    Hypercholesterolemia    Hypertension    Systolic heart failure ([place])    ischemic cardiomyopathy   Wears dentures    full upper and lower    Past Surgical History:  Procedure Laterality Date   AORTIC VALVE REPLACEMENT N/A 01/25/2019   Procedure: AORTIC VALVE REPLACEMENT (AVR) 25mm Inspiris valve;  Surgeon: Gaye Pollack, MD;  Location: [facility] OR;  Service: Open Heart Surgery;  Laterality: N/A;   arm surgery     right arm fx s/p "plate insertion"   CARDIAC CATHETERIZATION     CATARACT EXTRACTION W/PHACO Right 11/17/2016   Procedure: CATARACT EXTRACTION PHACO AND INTRAOCULAR LENS PLACEMENT ([place])  Right;  Surgeon: Leandrew Koyanagi, MD;  Location: [place];  Service: Ophthalmology;  Laterality: Right;   CORONARY ARTERY BYPASS GRAFT N/A 01/25/2019   LIMA-LAD and sequential SVG-rPDA-rPL   DENTAL SURGERY     all teeth  extracted   RIGHT HEART CATH AND CORONARY ANGIOGRAPHY N/A 01/02/2019   Procedure: RIGHT HEART CATH AND CORONARY ANGIOGRAPHY;  Surgeon: Nelva Bush, MD;  Location: [place] CV [facility];  Service: Cardiovascular;  Laterality: N/A;   TEE WITHOUT CARDIOVERSION N/A 01/25/2019   Procedure: TRANSESOPHAGEAL ECHOCARDIOGRAM (TEE);  Surgeon: Gaye Pollack, MD;  Location: [place];  Service: Open Heart Surgery;  Laterality: N/A;    There were no vitals filed for this visit.   Subjective Assessment - 01/07/21 1120     Subjective Pt reports a little increase in soreness. No pain reported. Pt states the rash in axillary area is getting better.    Pertinent History Pt's MRI results were available 11/27/2020 with mild joint changes. Pt reported abnormal EMG studies that was conducted 2-3 weeks ago. Pt is retired from an [employer] job that required traveling. Hobbies include yards work and maintaining his home. Pt is R hand dominate. Pt stated moderate aggravation with current information that was made available to him during his current treatment. Pt requested transparency with all test and measures as well as prognosis and progression.    Limitations House hold activities    How long can you sit comfortably? Deferred to later tx    How long can you  stand comfortably? Deferred to later tx    How long can you walk comfortably? Deferred to later tx    Diagnostic tests MRI and EMG    Patient Stated Goals " Get better"    Currently in Pain? No/denies    Pain Score 0-No pain              Therapeutic Exercise:   *Seated exercises done in chair with pillow support the back and L arm for protection of L GH joint.*    Seated Pulleys flex x30 with verbal and contact cueing needed for abd for proper mechanics for proper shoulder movement within a pain free range.   Seated L elbow flexion 2x20, wrist ext/flex radial deviation x20. Verbal and contact cueing needed with CGA-MIN assist to prevent involuntary L  shoulder internal rotation.      Supine shoulder press 35min and bilat shoulder flexion 57min with dowel with NMES 10 sec on 30 sec; B shoulder press limited by pain to 6/10 (NPS) in L proximal bicep tendon. Supervision to ensure safety of shoulder stability during the 10 sec on phase. Pt was unable to tolerated repetitive movement d/t pain stated above, and instructed to isometric hold -- no pain reported.     Shirt Donning/doffing with SBA for doff, Min A for donning. Verbal and contact cueing to ensure safety of movement.    Isometric flexion/extension/IR to L shoulder added to HEP; ER isometric deferred d/t lack of RC muscles activation.   HEP updated and reviewed     Electric Stim attended:   EMPI  Continuum: 2 channel parallel set up with 2''/2'' pads located supraspinous, ant deltoid, mid deltoid, and infraspinatus. On/off time: 10sec/30sec. Duration 20 mins. Rate:  35 Hz.  Pulse width: 300 micro-sec. Asymmetric wave from. 3 sec ramp on/off. Intensity 30. Pt skin displayed minor redness that reduced to baseline within 5 mins post tx. PT assist with AAROM ex. (See above)   Assessemtn:  PT session focused on repetitive shoulder AROM with FES to L shoulder. Pt tolerated 10 minutes of supine shoulder flexion/chest press before it was limited by pain around L bicep tendon. Pt was able to finished FES with isometric hold, no pain reported. Pt demonstrated ability to perform isometric hold against L shoulder flexion/extension/internal rotation (added to HEP); unable to perform external rotation. Pt educated to use pain as indicator during exercise to prevent further shoulder subluxation.      Access Code: [email or web address]  PT Education - 01/07/21 1226     Education Details pt educated on Isometric holding and use pain as indicator to modified exercise intensity. Access Code: [email or web address]    Person(s) Educated Patient    Methods Explanation;Demonstration;Tactile cues;Verbal cues    Comprehension  Verbalized understanding;Returned demonstration                 PT Long Term Goals - 12/25/20 1625       PT LONG TERM GOAL #1   Title Pt will improve FOTO score to predicted value of 59 to improve independence with ADLs    Baseline 25    Time 8    Period Weeks    Status New    Target Date 02/19/21      PT LONG TERM GOAL #2   Title Pt will improve L shoulder strengt tested via MMT Flex/abd to 2/5 to promote increase LUE use during grooming and feeding.    Baseline 0/5    Time 8    Period Weeks  Status New    Target Date 02/19/21      PT LONG TERM GOAL #3   Title Pt will improve L PROM equal to R to maintain glenohumeral joint health for future functional strength gains.    Baseline L flex105  Abd 68  R flexion/abd 110    Time 8    Period Weeks    Status New    Target Date 02/19/21      PT LONG TERM GOAL #4   Title Pt reports ability to don/doff pants and shirts MI without spouse assist to improve dressing ADL's.    Baseline Spouse current Mod-Max assist during dressing.    Time 8    Period Weeks    Status New    Target Date 02/19/21                   Plan - 01/07/21 1326     Clinical Impression Statement PT session focused on repetitive shoulder AROM with FES to L shoulder. Pt tolerated 10 minutes of supine shoulder flexion/chest press before it was limited by pain around L bicep tendon. Pt was finished FES with isometric hold, no pain reported. Pt demonstrated ability to perform isometric hold against L shoulder flexion/extension/internal rotation (added to HEP); unable to perform external rotation. Pt educated to use pain as indicator during exercise to prevent further shoulder subluxation.    Personal Factors and Comorbidities Age;Comorbidity 3+;Time since onset of injury/illness/exacerbation    Examination-Activity Limitations Bed Mobility;Carry;Lift;Dressing;Reach Overhead;Self Feeding;Transfers    Examination-Participation Restrictions Yard Work     Stability/Clinical Decision Making Unstable/Unpredictable    Clinical Decision Making Moderate    Rehab Potential Fair    PT Frequency 2x / week    PT Duration 8 weeks    PT Treatment/Interventions ADLs/Self Care Home Management;Aquatic Therapy;Biofeedback;Cryotherapy;Electrical Stimulation;Moist Heat;Traction;Ultrasound;Fluidtherapy;Contrast Bath;Gait training;Stair training;Functional mobility training;Therapeutic activities;Therapeutic exercise;Balance training;Neuromuscular re-education;Patient/family education;Manual techniques;Passive range of motion;Dry needling;Energy conservation;Splinting;Taping;Joint Manipulations    PT Next Visit Plan Hoffmans sign, Wrist MMT, reassess soreness within 48 hrs of tx for proper tissue loading.    PT Home Exercise Plan Access Code: [email or web address]    Consulted and Agree with Plan of Care Patient             Patient will benefit from skilled therapeutic intervention in order to improve the following deficits and impairments:  Decreased activity tolerance, Decreased coordination, Decreased strength, Impaired UE functional use, Pain, Postural dysfunction, Improper body mechanics, Decreased range of motion, Decreased endurance, Decreased mobility, Decreased skin integrity, Hypomobility, Impaired sensation  Visit Diagnosis: Shoulder weakness  Pain of left upper extremity  Abnormal posture     Problem List Patient Active Problem List   Diagnosis Date Noted   Shingles 10/12/2020   Shoulder pain, left 10/12/2020   AKI (acute kidney injury) ([place]) 10/20/2019   Dilatation of thoracic aorta ([place]) 10/19/2019   Pedal edema 07/11/2019   Daytime somnolence 06/02/2019   Coronary artery disease involving native coronary artery of native heart without angina pectoris 02/08/2019   Chronic HFrEF (heart failure with reduced ejection fraction) ([place]) 02/08/2019   S/P aortic valve replacement 02/08/2019   Postoperative anemia 02/08/2019   S/P CABG x 3 87/86/7672    Acute systolic congestive heart failure ([place]) 12/21/2018   Biventricular failure ([place]) 12/21/2018   Nonrheumatic aortic valve stenosis 12/21/2018   SOB (shortness of breath) 12/11/2018   Hypoxia 12/11/2018   Shortness of breath 12/11/2018   Right shoulder pain 05/08/2015   Health care maintenance 11/12/2014  BMI 32.0-32.9,adult 06/18/2014   Elevated PSA 02/17/2014   CKD (chronic kidney disease) stage 3, GFR 30-59 ml/min (HCC) 05/21/2012   Gout 05/21/2012   Hypertension 05/02/2012   Hyperlipidemia LDL goal <70 05/02/2012   Diabetes ([place]) 05/02/2012   Pura Spice, PT, DPT # [id number] Shirley Friar, SPT 01/08/2021, 7:25 AM  [facility] [address] [place], [place], [address] Phone: [phone]   Fax:  [phone]  Name: VYOM BRASS MRN: [id number] Date of Birth: 1931-05-14

## 2021-01-08 ENCOUNTER — Ambulatory Visit (INDEPENDENT_AMBULATORY_CARE_PROVIDER_SITE_OTHER): Payer: Medicare HMO | Admitting: Internal Medicine

## 2021-01-08 ENCOUNTER — Other Ambulatory Visit: Payer: Self-pay

## 2021-01-08 DIAGNOSIS — I5022 Chronic systolic (congestive) heart failure: Secondary | ICD-10-CM

## 2021-01-08 DIAGNOSIS — N1831 Chronic kidney disease, stage 3a: Secondary | ICD-10-CM

## 2021-01-08 DIAGNOSIS — E1159 Type 2 diabetes mellitus with other circulatory complications: Secondary | ICD-10-CM

## 2021-01-08 DIAGNOSIS — M1A9XX0 Chronic gout, unspecified, without tophus (tophi): Secondary | ICD-10-CM | POA: Diagnosis not present

## 2021-01-08 DIAGNOSIS — I10 Essential (primary) hypertension: Secondary | ICD-10-CM

## 2021-01-08 DIAGNOSIS — I77810 Thoracic aortic ectasia: Secondary | ICD-10-CM | POA: Diagnosis not present

## 2021-01-08 DIAGNOSIS — M25512 Pain in left shoulder: Secondary | ICD-10-CM | POA: Diagnosis not present

## 2021-01-08 DIAGNOSIS — E785 Hyperlipidemia, unspecified: Secondary | ICD-10-CM | POA: Diagnosis not present

## 2021-01-08 DIAGNOSIS — I2510 Atherosclerotic heart disease of native coronary artery without angina pectoris: Secondary | ICD-10-CM

## 2021-01-08 LAB — HM DIABETES FOOT EXAM

## 2021-01-08 MED ORDER — ALLOPURINOL 100 MG PO TABS: ORAL_TABLET | ORAL | 1 refills | Status: DC

## 2021-01-08 MED ORDER — LORAZEPAM 1 MG PO TABS: ORAL_TABLET | ORAL | 1 refills | Status: DC

## 2021-01-08 NOTE — Progress Notes (Signed)
Subjective:    Patient ID: [name] [name], male    DOB: Jul 19, 1930, 87 y.o.   MRN: [id number]  HPI This visit occurred during the SARS-CoV-2 public health emergency.  Safety protocols were in place, including screening questions prior to the visit, additional usage of staff PPE, and extensive cleaning of exam room while observing appropriate contact time as indicated for disinfecting solutions.   Patient here for a scheduled follow up. Here to follow up regarding diabetes, hypercholesterolemia and hypertension.  He saw neurology and has been going to PT for shoulder weakness.  MRI c-spine ordered.  No chest pain or sob reported.  Breathing stable.  No increased cough or congestion.  No acid reflux.  No abdominal pain.  Bowels moving.  Handling stress.    Past Medical History:  Diagnosis Date   Aortic stenosis    Arthritis    right shoulder   BPH (benign prostatic hypertrophy)    Coronary artery disease    Gout    Heart murmur    History of chicken pox    Hypercholesterolemia    Hypertension    Systolic heart failure (HCC)    ischemic cardiomyopathy   Wears dentures    full upper and lower   Past Surgical History:  Procedure Laterality Date   AORTIC VALVE REPLACEMENT N/A 01/25/2019   Procedure: AORTIC VALVE REPLACEMENT (AVR) 32mm Inspiris valve;  Surgeon: Alleen Borne, MD;  [facility];  Service: Open Heart Surgery;  Laterality: N/A;   arm surgery     right arm fx s/p "plate insertion"   CARDIAC CATHETERIZATION     CATARACT EXTRACTION W/PHACO Right 11/17/2016   Procedure: CATARACT EXTRACTION PHACO AND INTRAOCULAR LENS PLACEMENT ([facility])  Right;  Surgeon: Lockie Mola, MD;  Location: [facility];  Service: Ophthalmology;  Laterality: Right;   CORONARY ARTERY BYPASS GRAFT N/A 01/25/2019   LIMA-LAD and sequential SVG-rPDA-rPL   DENTAL SURGERY     all teeth extracted   RIGHT HEART CATH AND CORONARY ANGIOGRAPHY N/A 01/02/2019   Procedure: RIGHT HEART CATH AND  CORONARY ANGIOGRAPHY;  Surgeon: Yvonne Kendall, MD;  Location: [facility];  Service: Cardiovascular;  Laterality: N/A;   TEE WITHOUT CARDIOVERSION N/A 01/25/2019   Procedure: TRANSESOPHAGEAL ECHOCARDIOGRAM (TEE);  Surgeon: Alleen Borne, MD;  Location: [facility] OR;  Service: Open Heart Surgery;  Laterality: N/A;   Family History  Problem Relation Age of Onset   Leukemia Father    Congestive Heart Failure Mother    Cancer Daughter        Breast Cancer   Prostate cancer Neg Hx    Colon cancer Neg Hx    Social History   Socioeconomic History   Marital status: Married    Spouse name: Not on file   Number of children: Not on file   Years of education: Not on file   Highest education level: Not on file  Occupational History   Not on file  Tobacco Use   Smoking status: Never   Smokeless tobacco: Current   Tobacco comments:    not ready to quit currently  Vaping Use   Vaping Use: Never used  Substance and Sexual Activity   Alcohol use: No    Alcohol/week: 0.0 standard drinks   Drug use: No   Sexual activity: Not Currently  Other Topics Concern   Not on file  Social History Narrative   Not on file   Social Determinants of Health   Financial Resource Strain: Low  Risk    Difficulty of Paying Living Expenses: Not hard at all  Food Insecurity: No Food Insecurity   Worried About [place] in the Last Year: Never true   Ran Out of Food in the Last Year: Never true  Transportation Needs: No Transportation Needs   Lack of Transportation (Medical): No   Lack of Transportation (Non-Medical): No  Physical Activity: Not on file  Stress: No Stress Concern Present   Feeling of Stress : Not at all  Social Connections: Unknown   Frequency of Communication with Friends and Family: Not on file   Frequency of Social Gatherings with Friends and Family: Not on file   Attends Religious Services: Not on file   Active Member of Clubs or Organizations: Not on file   Attends  Archivist Meetings: Not on file   Marital Status: Married    Review of Systems  Constitutional:  Negative for appetite change and unexpected weight change.  HENT:  Negative for congestion and sinus pressure.   Respiratory:  Negative for cough, chest tightness and shortness of breath.   Cardiovascular:  Negative for chest pain, palpitations and leg swelling.  Gastrointestinal:  Negative for abdominal pain, diarrhea, nausea and vomiting.  Genitourinary:  Negative for difficulty urinating and dysuria.  Musculoskeletal:  Negative for joint swelling and myalgias.  Skin:  Negative for color change and rash.  Neurological:  Negative for dizziness, light-headedness and headaches.  Psychiatric/Behavioral:  Negative for agitation and dysphoric mood.       Objective:    Physical Exam Vitals reviewed.  Constitutional:      General: He is not in acute distress.    Appearance: Normal appearance. He is well-developed.  HENT:     Head: Normocephalic and atraumatic.     Right Ear: External ear normal.     Left Ear: External ear normal.  Eyes:     General: No scleral icterus.       Right eye: No discharge.        Left eye: No discharge.     Conjunctiva/sclera: Conjunctivae normal.  Cardiovascular:     Rate and Rhythm: Normal rate and regular rhythm.  Pulmonary:     Effort: Pulmonary effort is normal. No respiratory distress.     Breath sounds: Normal breath sounds.  Abdominal:     General: Bowel sounds are normal.     Palpations: Abdomen is soft.     Tenderness: There is no abdominal tenderness.  Musculoskeletal:        General: No swelling or tenderness.     Cervical back: Neck supple. No tenderness.  Lymphadenopathy:     Cervical: No cervical adenopathy.  Skin:    Findings: No erythema or rash.  Neurological:     Mental Status: He is alert.  Psychiatric:        Mood and Affect: Mood normal.        Behavior: Behavior normal.    BP 130/84   Pulse 70   Temp 97.8 F  (36.6 C)   Resp 16   Ht $R'5\' 10"'xg$  (1.778 m)   Wt 227 lb (103 kg)   SpO2 98%   BMI 32.57 kg/m  Wt Readings from Last 3 Encounters:  01/08/21 227 lb (103 kg)  10/17/20 222 lb 12.8 oz (101.1 kg)  10/16/20 223 lb (101.2 kg)    Outpatient Encounter Medications as of 01/08/2021  Medication Sig   ergocalciferol (VITAMIN D2) 1.25 MG (50000 UT) capsule Take 1  capsule by mouth once a week.   gabapentin (NEURONTIN) 300 MG capsule Take by mouth.   allopurinol (ZYLOPRIM) 100 MG tablet Take two tablets daily   amLODipine (NORVASC) 5 MG tablet TAKE 1 TABLET EVERY DAY   aspirin EC 81 MG tablet Take 1 tablet (81 mg total) by mouth daily.   atorvastatin (LIPITOR) 80 MG tablet TAKE 1 TABLET EVERY MORNING   carvedilol (COREG) 6.25 MG tablet TAKE 1 TABLET TWICE DAILY WITH A MEAL   LORazepam (ATIVAN) 1 MG tablet Takes 1/2 to 1 tablet q day prn   losartan (COZAAR) 100 MG tablet Take 1 tablet (100 mg total) by mouth daily.   [DISCONTINUED] allopurinol (ZYLOPRIM) 100 MG tablet Take 1 tablet (100 mg total) by mouth daily.   [DISCONTINUED] LORazepam (ATIVAN) 1 MG tablet Takes 1/2 to 1 tablet q day prn   [DISCONTINUED] methylPREDNISolone (MEDROL DOSEPAK) 4 MG TBPK tablet Medrol dosepack - six day taper.  Take as directed.   No facility-administered encounter medications on file as of 01/08/2021.     Lab Results  Component Value Date   WBC 9.6 05/23/2020   HGB 13.2 05/23/2020   HCT 39.4 05/23/2020   PLT 166.0 05/23/2020   GLUCOSE 90 10/16/2020   CHOL 78 10/16/2020   TRIG 57.0 10/16/2020   HDL 24.20 (L) 10/16/2020   LDLCALC 42 10/16/2020   ALT 25 10/16/2020   AST 26 10/16/2020   NA 138 10/16/2020   K 3.5 10/31/2020   CL 100 10/16/2020   CREATININE 1.05 10/16/2020   BUN 16 10/16/2020   CO2 28 10/16/2020   TSH 4.91 (H) 10/16/2020   PSA 4.15 (H) 02/08/2014   INR 1.7 (H) 01/25/2019   HGBA1C 6.6 (H) 10/16/2020   MICROALBUR 46.0 (H) 12/05/2019      Assessment & Plan:   Problem List Items Addressed  This Visit     Chronic HFrEF (heart failure with reduced ejection fraction) (HCC)    Continue losartan and coreg.  No evidence of volume overload on exam.  Follow.        CKD (chronic kidney disease) stage 3, GFR 30-59 ml/min (HCC)    Avoid antiinflammatories.  Stay hydrated.  Follow metabolic panel.  Continue losartan.        Coronary artery disease involving native coronary artery of native heart without angina pectoris    Continue coreg, losartan and lipitor.  No chest pain.  Continue risk factor modification.        Diabetes ([place])    On no medication.  Low carb diet and exercise.  Stay hydrated.  Monitor sugars. Follow met b and a1c.        Dilatation of thoracic aorta (HCC)    S/p surgery.  Continues to be followed by cardiology - monitoring.        Gout    Recent gout flare.  No pain now.  Follow.  Remains on allopurinol.        Hyperlipidemia LDL goal <70    Continue lipitor.  Low cholesterol diet and exercise.  Follow lipid panel and liver function tests.        Hypertension    Continue losartan, amlodipine and Coreg.  Follow blood pressure.       Shoulder pain, left    Has seen ortho.  Has seen neurology.  Going to PT.  Follow.          Einar Pheasant, MD

## 2021-01-12 ENCOUNTER — Other Ambulatory Visit: Payer: Self-pay

## 2021-01-12 ENCOUNTER — Ambulatory Visit: Payer: Medicare HMO | Admitting: Physical Therapy

## 2021-01-12 ENCOUNTER — Encounter: Payer: Self-pay | Admitting: Physical Therapy

## 2021-01-12 DIAGNOSIS — R293 Abnormal posture: Secondary | ICD-10-CM

## 2021-01-12 DIAGNOSIS — R29898 Other symptoms and signs involving the musculoskeletal system: Secondary | ICD-10-CM | POA: Diagnosis not present

## 2021-01-12 DIAGNOSIS — M79602 Pain in left arm: Secondary | ICD-10-CM | POA: Diagnosis not present

## 2021-01-12 NOTE — Therapy (Signed)
[facility] [address] [place], [place], [address] Phone: [phone]   Fax:  [phone]  Physical Therapy Treatment  Patient Details  Name: [name] [name] MRN: [id number] Date of Birth: 1930-06-29 Referring Provider (PT): Ray Church, MD   Encounter Date: 01/12/2021   PT End of Session - 01/12/21 1517     Visit Number 5    Number of Visits 16    Date for PT Re-Evaluation 02/19/21    Authorization - Visit Number 5    Authorization - Number of Visits 10    Progress Note Due on Visit 10    PT Start Time [id number]    PT Stop Time 1515    PT Time Calculation (min) 46 min    Equipment Utilized During Treatment Other (comment)    Activity Tolerance Patient tolerated treatment well;Patient limited by pain    Behavior During Therapy [facility] for tasks assessed/performed             Past Medical History:  Diagnosis Date   Aortic stenosis    Arthritis    right shoulder   BPH (benign prostatic hypertrophy)    Coronary artery disease    Gout    Heart murmur    History of chicken pox    Hypercholesterolemia    Hypertension    Systolic heart failure ([place])    ischemic cardiomyopathy   Wears dentures    full upper and lower    Past Surgical History:  Procedure Laterality Date   AORTIC VALVE REPLACEMENT N/A 01/25/2019   Procedure: AORTIC VALVE REPLACEMENT (AVR) 6m Inspiris valve;  Surgeon: BGaye Pollack MD;  [facility]: [facility];  Service: Open Heart Surgery;  Laterality: N/A;   arm surgery     right arm fx s/p "plate insertion"   CARDIAC CATHETERIZATION     CATARACT EXTRACTION W/PHACO Right 11/17/2016   Procedure: CATARACT EXTRACTION PHACO AND INTRAOCULAR LENS PLACEMENT ([place]  Right;  Surgeon: BLeandrew Koyanagi MD;  [facility]  Service: Ophthalmology;  Laterality: Right;   CORONARY ARTERY BYPASS GRAFT N/A 01/25/2019   LIMA-LAD and sequential SVG-rPDA-rPL   DENTAL SURGERY     all teeth  extracted   RIGHT HEART CATH AND CORONARY ANGIOGRAPHY N/A 01/02/2019   Procedure: RIGHT HEART CATH AND CORONARY ANGIOGRAPHY;  Surgeon: ENelva Bush MD;  [facility];  Service: Cardiovascular;  Laterality: N/A;   TEE WITHOUT CARDIOVERSION N/A 01/25/2019   Procedure: TRANSESOPHAGEAL ECHOCARDIOGRAM (TEE);  Surgeon: BGaye Pollack MD;  [facility]  Service: Open Heart Surgery;  Laterality: N/A;    There were no vitals filed for this visit.   Subjective Assessment - 01/12/21 1500     Subjective Pt reports no increase in soreness. No pain reported. Pt unable to perform ISO HEP    Limitations House hold activities    How long can you sit comfortably? Deferred to later tx    How long can you stand comfortably? Deferred to later tx    How long can you walk comfortably? Deferred to later tx    Diagnostic tests MRI and EMG    Patient Stated Goals " Get better"    Currently in Pain? No/denies              Therapeutic Exercise:   *Seated exercises done in chair with pillow support the back and L arm for protection of L GH joint.*   Continue seated Pulleys flex x30 with verbal  and contact cueing needed for abd for proper mechanics for proper shoulder movement within a pain free range.      Seated 1) AAROM L shoulder abduction with dowel 10 min and 2) bilat shoulder flexion 74mn with dowel with NMES 10 sec on 30 sec; B shoulder press limited by pain to 4/10 (NPS) in L medial [place]joint line Supervision to ensure safety of shoulder stability during the 10 sec on phase. Pt was able to tolerate repetitive movement and instructed to isometric hold -- no pain reported.      Shirt Donning/doffing with SBA for doff, Min A for donning. Verbal and contact cueing to ensure safety of movement.    HEP review: Isometric flexion/extension/IR to L shoulder added to HEP; ER isometric deferred d/t lack of RC muscles activation.   UE bike set up: seat 8; L 2; R 5, 284m fwd, 43m46m bwd.      Electric Stim attended:   EMPI  Continuum: 2 channel parallel set up with 2''/2'' pads located supraspinous, ant deltoid, mid deltoid, and infraspinatus. On/off time: 10sec/30sec. Duration 20 mins. Rate:  35 Hz.  Pulse width: 300 micro-sec. Asymmetric wave from. 3 sec ramp on/off. Intensity 30. Pt skin displayed minor redness that reduced to baseline within 5 mins post tx. PT assist with AAROM ex. (See above)     PT Long Term Goals - 12/25/20 1625       PT LONG TERM GOAL #1   Title Pt will improve FOTO score to predicted value of 59 to improve independence with ADLs    Baseline 25    Time 8    Period Weeks    Status New    Target Date 02/19/21      PT LONG TERM GOAL #2   Title Pt will improve L shoulder strengt tested via MMT Flex/abd to 2/5 to promote increase LUE use during grooming and feeding.    Baseline 0/5    Time 8    Period Weeks    Status New    Target Date 02/19/21      PT LONG TERM GOAL #3   Title Pt will improve L PROM equal to R to maintain glenohumeral joint health for future functional strength gains.    Baseline L flex105  Abd 68  R flexion/abd 110    Time 8    Period Weeks    Status New    Target Date 02/19/21      PT LONG TERM GOAL #4   Title Pt reports ability to don/doff pants and shirts MI without spouse assist to improve dressing ADL's.    Baseline Spouse current Mod-Max assist during dressing.    Time 8    Period Weeks    Status New    Target Date 02/19/21                   Plan - 01/12/21 1534     Clinical Impression Statement PT session focused on repetitive shoulder AROM with FES to L shoulder in seated position. Isometric HEP reviewed and modified. Pt was able to perform FES shoulder AAROM in seated position without increase of pain. UE bike attempted and modified settings to movement with pain. Pt educated to use pain as indicator during exercise to prevent further shoulder subluxation.    Personal Factors and  Comorbidities Age;Comorbidity 3+;Time since onset of injury/illness/exacerbation    Examination-Activity Limitations Bed Mobility;Carry;Lift;Dressing;Reach Overhead;Self Feeding;Transfers    Examination-Participation Restrictions Yard Work  Stability/Clinical Decision Making Unstable/Unpredictable    Clinical Decision Making Moderate    Rehab Potential Fair    PT Frequency 2x / week    PT Duration 8 weeks    PT Treatment/Interventions ADLs/Self Care Home Management;Aquatic Therapy;Biofeedback;Cryotherapy;Electrical Stimulation;Moist Heat;Traction;Ultrasound;Fluidtherapy;Contrast Bath;Gait training;Stair training;Functional mobility training;Therapeutic activities;Therapeutic exercise;Balance training;Neuromuscular re-education;Patient/family education;Manual techniques;Passive range of motion;Dry needling;Energy conservation;Splinting;Taping;Joint Manipulations    PT Next Visit Plan Reassess soreness within 48 hrs of tx for proper tissue loading.    PT Home Exercise Plan Access Code: [email or web address]    Consulted and Agree with Plan of Care Patient             Patient will benefit from skilled therapeutic intervention in order to improve the following deficits and impairments:  Decreased activity tolerance, Decreased coordination, Decreased strength, Impaired UE functional use, Pain, Postural dysfunction, Improper body mechanics, Decreased range of motion, Decreased endurance, Decreased mobility, Decreased skin integrity, Hypomobility, Impaired sensation  Visit Diagnosis: Shoulder weakness  Pain of left upper extremity  Abnormal posture     Problem List Patient Active Problem List   Diagnosis Date Noted   Shingles 10/12/2020   Shoulder pain, left 10/12/2020   AKI (acute kidney injury) ([place]) 10/20/2019   Dilatation of thoracic aorta ([place]) 10/19/2019   Pedal edema 07/11/2019   Daytime somnolence 06/02/2019   Coronary artery disease involving native coronary artery of native heart  without angina pectoris 02/08/2019   Chronic HFrEF (heart failure with reduced ejection fraction) ([place]) 02/08/2019   S/P aortic valve replacement 02/08/2019   Postoperative anemia 02/08/2019   S/P CABG x 3 [id number]   Acute systolic congestive heart failure ([place]) 12/21/2018   Biventricular failure ([place]) 12/21/2018   Nonrheumatic aortic valve stenosis 12/21/2018   SOB (shortness of breath) 12/11/2018   Hypoxia 12/11/2018   Shortness of breath 12/11/2018   Right shoulder pain 05/08/2015   Health care maintenance 11/12/2014   BMI 32.0-32.9,adult 06/18/2014   Elevated PSA 02/17/2014   CKD (chronic kidney disease) stage 3, GFR 30-59 ml/min (HCC) 05/21/2012   Gout 05/21/2012   Hypertension 05/02/2012   Hyperlipidemia LDL goal <70 05/02/2012   Diabetes ([place]) 05/02/2012   Pura Spice, PT, DPT # [id number] Shirley Friar, SPT 01/13/2021, 9:10 AM  [place] [address] [place], [place], [address] Phone: [phone]   Fax:  [phone]  Name: JEKAI BARBERI MRN: [id number] Date of Birth: Mar 13, 1931

## 2021-01-14 ENCOUNTER — Other Ambulatory Visit: Payer: Self-pay

## 2021-01-14 ENCOUNTER — Ambulatory Visit: Payer: Medicare HMO | Admitting: Physical Therapy

## 2021-01-14 ENCOUNTER — Encounter: Payer: Self-pay | Admitting: Physical Therapy

## 2021-01-14 DIAGNOSIS — R29898 Other symptoms and signs involving the musculoskeletal system: Secondary | ICD-10-CM | POA: Diagnosis not present

## 2021-01-14 DIAGNOSIS — R293 Abnormal posture: Secondary | ICD-10-CM | POA: Diagnosis not present

## 2021-01-14 DIAGNOSIS — M79602 Pain in left arm: Secondary | ICD-10-CM | POA: Diagnosis not present

## 2021-01-14 NOTE — Therapy (Signed)
[facility] [address] [place], [place], [address] Phone: [phone]   Fax:  [phone]  Physical Therapy Treatment  Patient Details  Name: [name] [name] MRN: [id number] Date of Birth: 02/14/1931 Referring Provider (PT): Ray Church, MD   Encounter Date: 01/14/2021   PT End of Session - 01/14/21 1107     Visit Number 6    Number of Visits 16    Date for PT Re-Evaluation 02/19/21    Authorization - Visit Number 6    Authorization - Number of Visits 10    Progress Note Due on Visit 10    PT Start Time 1029    PT Stop Time 1120    PT Time Calculation (min) 51 min    Equipment Utilized During Treatment Other (comment)    Activity Tolerance Patient tolerated treatment well;Patient limited by pain    Behavior During Therapy [facility] for tasks assessed/performed             Past Medical History:  Diagnosis Date   Aortic stenosis    Arthritis    right shoulder   BPH (benign prostatic hypertrophy)    Coronary artery disease    Gout    Heart murmur    History of chicken pox    Hypercholesterolemia    Hypertension    Systolic heart failure ([place])    ischemic cardiomyopathy   Wears dentures    full upper and lower    Past Surgical History:  Procedure Laterality Date   AORTIC VALVE REPLACEMENT N/A 01/25/2019   Procedure: AORTIC VALVE REPLACEMENT (AVR) 42m Inspiris valve;  Surgeon: BGaye Pollack MD;  [facility];  Service: Open Heart Surgery;  Laterality: N/A;   arm surgery     right arm fx s/p "plate insertion"   CARDIAC CATHETERIZATION     CATARACT EXTRACTION W/PHACO Right 11/17/2016   Procedure: CATARACT EXTRACTION PHACO AND INTRAOCULAR LENS PLACEMENT ([place]  Right;  Surgeon: BLeandrew Koyanagi MD;  [facility]  Service: Ophthalmology;  Laterality: Right;   CORONARY ARTERY BYPASS GRAFT N/A 01/25/2019   LIMA-LAD and sequential SVG-rPDA-rPL   DENTAL SURGERY     all teeth  extracted   RIGHT HEART CATH AND CORONARY ANGIOGRAPHY N/A 01/02/2019   Procedure: RIGHT HEART CATH AND CORONARY ANGIOGRAPHY;  Surgeon: ENelva Bush MD;  [facility];  Service: Cardiovascular;  Laterality: N/A;   TEE WITHOUT CARDIOVERSION N/A 01/25/2019   Procedure: TRANSESOPHAGEAL ECHOCARDIOGRAM (TEE);  Surgeon: BGaye Pollack MD;  [facility]  Service: Open Heart Surgery;  Laterality: N/A;    There were no vitals filed for this visit.   Subjective Assessment - 01/14/21 1036     Subjective Pt present to tx without any pain and minor soreness. Pt stated that has has noticed greater ability to reach with the LUE and decreased stiffness/pain during movements.    Pertinent History Pt's MRI results were available 11/27/2020 with mild joint changes. Pt reported abnormal EMG studies that was conducted 2-3 weeks ago. Pt is retired from an [employer] that required traveling. Hobbies include yards work and maintaining his home. Pt is R hand dominate. Pt stated moderate aggravation with current information that was made available to him during his current treatment. Pt requested transparency with all test and measures as well as prognosis and progression.    Limitations House hold activities    How long can you sit comfortably? Deferred to later tx  How long can you stand comfortably? Deferred to later tx    How long can you walk comfortably? Deferred to later tx    Diagnostic tests MRI and EMG    Patient Stated Goals " Get better"    Currently in Pain? No/denies    Pain Score 0-No pain                Therapeutic Exercise:   *Seated exercises done in chair with pillow support the back and L arm for protection of L GH joint.*   Continue seated Pulleys flex x30 with verbal and contact cueing needed for abd for proper mechanics for proper shoulder movement within a pain free range.       Supine:1) AAROM L shoulder abduction with dowel 10 min  2) Supine: bilat  shoulder flexion (seated attempted unable to keep shoulder depressed) 15 min with dowel with NMES 10 sec on 30 sec; B shoulder press limited by pain to 4/10 (NPS) in L medial [place] joint line Supervision to ensure safety of shoulder stability during the 10 sec on phase. Pt was able to tolerate repetitive movement and instructed to isometric hold -- no pain reported. verbal cue and tactile cue throughout FES to prevent shoulder elevation.      Shirt Donning/doffing with SBA for doff, Min A for donning.     HEP review: Isometric flexion/extension/IR to L shoulder added to HEP; ER isometric deferred d/t lack of RC muscles activation.   UE bike set up: seat 8; L 2; R 5, 2.36mn fwd, 2.531m bwd.   Isometric HEP reviewed, tactile cuing required to prevent shoulder elevation.    Next session will decrease off time using FES. Electric Stim attended:   EMPI  Continuum: 2 channel parallel set up with 2''/2'' pads located supraspinous, ant deltoid, mid deltoid, and infraspinatus. On/off time: 10sec/30sec. Duration 20 mins. Rate:  35 Hz.  Pulse width: 300 micro-sec. Asymmetric wave from. 3 sec ramp on/off. Intensity 30. Pt skin displayed minor redness that reduced to baseline within 5 mins post tx. PT assist with AAROM ex. (See above)  Manual:   STM to R distal biceps, infraspinatus and teres minor to promote AAROM to flexion and abduction, 10 degree of AAROM observed without pain.      PT Long Term Goals - 12/25/20 1625       PT LONG TERM GOAL #1   Title Pt will improve FOTO score to predicted value of 59 to improve independence with ADLs    Baseline 25    Time 8    Period Weeks    Status New    Target Date 02/19/21      PT LONG TERM GOAL #2   Title Pt will improve L shoulder strengt tested via MMT Flex/abd to 2/5 to promote increase LUE use during grooming and feeding.    Baseline 0/5    Time 8    Period Weeks    Status New    Target Date 02/19/21      PT LONG TERM GOAL #3   Title Pt will  improve L PROM equal to R to maintain glenohumeral joint health for future functional strength gains.    Baseline L flex105  Abd 68  R flexion/abd 110    Time 8    Period Weeks    Status New    Target Date 02/19/21      PT LONG TERM GOAL #4   Title Pt reports ability to don/doff pants and  shirts MI without spouse assist to improve dressing ADL's.    Baseline Spouse current Mod-Max assist during dressing.    Time 8    Period Weeks    Status New    Target Date 02/19/21                   Plan - 01/14/21 1143     Clinical Impression Statement Session focused on correcting glenohumeroscapular rhythmic motion by providing tactile cues for shoulder depression. Pt was able to increase AROM in flexion to touch the counter (approximately 40 degree). Education on maintain ROM with pulley at home was understood with verbal confirmation. Manual therapy was performed to promote pain free AROM to flexion and abduction with success of ~10 degree improvement. Next session will decrease off time using FES.    Personal Factors and Comorbidities Age;Comorbidity 3+;Time since onset of injury/illness/exacerbation    Examination-Activity Limitations Bed Mobility;Carry;Lift;Dressing;Reach Overhead;Self Feeding;Transfers    Examination-Participation Restrictions Yard Work    Stability/Clinical Decision Making Unstable/Unpredictable    Clinical Decision Making High    Rehab Potential Fair    PT Frequency 2x / week    PT Duration 8 weeks    PT Treatment/Interventions ADLs/Self Care Home Management;Aquatic Therapy;Biofeedback;Cryotherapy;Electrical Stimulation;Moist Heat;Traction;Ultrasound;Fluidtherapy;Contrast Bath;Gait training;Stair training;Functional mobility training;Therapeutic activities;Therapeutic exercise;Balance training;Neuromuscular re-education;Patient/family education;Manual techniques;Passive range of motion;Dry needling;Energy conservation;Splinting;Taping;Joint Manipulations    PT Next  Visit Plan Reassess soreness within 48 hrs of tx for proper tissue loading.    PT Home Exercise Plan Access Code: [email or web address]    Consulted and Agree with Plan of Care Patient             Patient will benefit from skilled therapeutic intervention in order to improve the following deficits and impairments:  Decreased activity tolerance, Decreased coordination, Decreased strength, Impaired UE functional use, Pain, Postural dysfunction, Improper body mechanics, Decreased range of motion, Decreased endurance, Decreased mobility, Decreased skin integrity, Hypomobility, Impaired sensation  Visit Diagnosis: Shoulder weakness  Abnormal posture  Pain of left upper extremity     Problem List Patient Active Problem List   Diagnosis Date Noted   Shingles 10/12/2020   Shoulder pain, left 10/12/2020   AKI (acute kidney injury) ([place]) 10/20/2019   Dilatation of thoracic aorta ([place]) 10/19/2019   Pedal edema 07/11/2019   Daytime somnolence 06/02/2019   Coronary artery disease involving native coronary artery of native heart without angina pectoris 02/08/2019   Chronic HFrEF (heart failure with reduced ejection fraction) ([place]) 02/08/2019   S/P aortic valve replacement 02/08/2019   Postoperative anemia 02/08/2019   S/P CABG x 3 [id number]   Acute systolic congestive heart failure ([place]) 12/21/2018   Biventricular failure ([place]) 12/21/2018   Nonrheumatic aortic valve stenosis 12/21/2018   SOB (shortness of breath) 12/11/2018   Hypoxia 12/11/2018   Shortness of breath 12/11/2018   Right shoulder pain 05/08/2015   Health care maintenance 11/12/2014   BMI 32.0-32.9,adult 06/18/2014   Elevated PSA 02/17/2014   CKD (chronic kidney disease) stage 3, GFR 30-59 ml/min (HCC) 05/21/2012   Gout 05/21/2012   Hypertension 05/02/2012   Hyperlipidemia LDL goal <70 05/02/2012   Diabetes ([place]) 05/02/2012   Pura Spice, PT, DPT # [id number] Shirley Friar, SPT 01/15/2021, 8:37 AM  [facility] [address]. [place], [place], [address] Phone: [phone]   Fax:  [phone]  Name: NARVELL TOLLIS MRN: [id number] Date of Birth: December 14, 1930

## 2021-01-18 ENCOUNTER — Encounter: Payer: Self-pay | Admitting: Internal Medicine

## 2021-01-18 NOTE — Assessment & Plan Note (Signed)
Continue coreg, losartan and lipitor.  No chest pain.  Continue risk factor modification.  

## 2021-01-18 NOTE — Assessment & Plan Note (Signed)
Recent gout flare.  No pain now.  Follow.  Remains on allopurinol.

## 2021-01-18 NOTE — Assessment & Plan Note (Signed)
Avoid antiinflammatories.  Stay hydrated.  Follow metabolic panel. Continue losartan.  

## 2021-01-18 NOTE — Assessment & Plan Note (Signed)
Continue losartan and coreg.  No evidence of volume overload on exam.  Follow.

## 2021-01-18 NOTE — Assessment & Plan Note (Signed)
Continue losartan, amlodipine and Coreg.  Follow blood pressure.

## 2021-01-18 NOTE — Assessment & Plan Note (Signed)
On no medication.  Low carb diet and exercise.  Stay hydrated.  Monitor sugars. Follow met b and a1c.

## 2021-01-18 NOTE — Assessment & Plan Note (Signed)
S/p surgery.  Continues to be followed by cardiology - monitoring.  

## 2021-01-18 NOTE — Assessment & Plan Note (Signed)
Has seen ortho.  Has seen neurology.  Going to PT.  Follow.

## 2021-01-18 NOTE — Assessment & Plan Note (Signed)
Continue lipitor.  Low cholesterol diet and exercise.  Follow lipid panel and liver function tests.   

## 2021-01-19 ENCOUNTER — Other Ambulatory Visit: Payer: Self-pay

## 2021-01-19 ENCOUNTER — Encounter: Payer: Self-pay | Admitting: Physical Therapy

## 2021-01-19 ENCOUNTER — Ambulatory Visit: Payer: Medicare HMO | Attending: Neurology | Admitting: Physical Therapy

## 2021-01-19 DIAGNOSIS — R293 Abnormal posture: Secondary | ICD-10-CM | POA: Diagnosis not present

## 2021-01-19 DIAGNOSIS — M79602 Pain in left arm: Secondary | ICD-10-CM | POA: Insufficient documentation

## 2021-01-19 DIAGNOSIS — R29898 Other symptoms and signs involving the musculoskeletal system: Secondary | ICD-10-CM | POA: Insufficient documentation

## 2021-01-19 NOTE — Therapy (Signed)
[facility] [address] [place], [place], [address] Phone: [phone]   Fax:  [phone]  Physical Therapy Treatment  Patient Details  Name: [name] [name] MRN: [id number] Date of Birth: [birth year] Referring Provider (PT): Ray Church, MD   Encounter Date: 01/19/2021   PT End of Session - 01/19/21 1438     Visit Number 7    Number of Visits 16    Date for PT Re-Evaluation 02/19/21    Authorization - Visit Number 7    Authorization - Number of Visits 10    Progress Note Due on Visit 10    PT Start Time [id number]    PT Stop Time [id number]    PT Time Calculation (min) 49 min    Activity Tolerance Patient tolerated treatment well;Patient limited by pain    Behavior During Therapy [facility] for tasks assessed/performed             Past Medical History:  Diagnosis Date   Aortic stenosis    Arthritis    right shoulder   BPH (benign prostatic hypertrophy)    Coronary artery disease    Gout    Heart murmur    History of chicken pox    Hypercholesterolemia    Hypertension    Systolic heart failure ([place])    ischemic cardiomyopathy   Wears dentures    full upper and lower    Past Surgical History:  Procedure Laterality Date   AORTIC VALVE REPLACEMENT N/A 01/25/2019   Procedure: AORTIC VALVE REPLACEMENT (AVR) 69m Inspiris valve;  Surgeon: BGaye Pollack MD;  [facility];  Service: Open Heart Surgery;  Laterality: N/A;   arm surgery     right arm fx s/p "plate insertion"   CARDIAC CATHETERIZATION     CATARACT EXTRACTION W/PHACO Right 11/17/2016   Procedure: CATARACT EXTRACTION PHACO AND INTRAOCULAR LENS PLACEMENT ([place]  Right;  Surgeon: BLeandrew Koyanagi MD;  [place]  Service: Ophthalmology;  Laterality: Right;   CORONARY ARTERY BYPASS GRAFT N/A 01/25/2019   LIMA-LAD and sequential SVG-rPDA-rPL   DENTAL SURGERY     all teeth extracted   RIGHT HEART CATH AND CORONARY ANGIOGRAPHY N/A  01/02/2019   Procedure: RIGHT HEART CATH AND CORONARY ANGIOGRAPHY;  Surgeon: ENelva Bush MD;  [facility];  Service: Cardiovascular;  Laterality: N/A;   TEE WITHOUT CARDIOVERSION N/A 01/25/2019   Procedure: TRANSESOPHAGEAL ECHOCARDIOGRAM (TEE);  Surgeon: BGaye Pollack MD;  [place]  Service: Open Heart Surgery;  Laterality: N/A;    There were no vitals filed for this visit.   Subjective Assessment - 01/19/21 1432     Subjective Pt present to tx without any pain and soreness. Pt stated that he is able to manage stiffness during non-clinic time with use of the pulleys. Pt stated good adherence to isometric HEP.    Pertinent History Pt's MRI results were available 11/27/2020 with mild joint changes. Pt reported abnormal EMG studies that was conducted 2-3 weeks ago. Pt is retired from an [employer] that required traveling. Hobbies include yards work and maintaining his home. Pt is R hand dominate. Pt stated moderate aggravation with current information that was made available to him during his current treatment. Pt requested transparency with all test and measures as well as prognosis and progression.    Limitations House hold activities    How long can you sit comfortably? Deferred to later tx    How long  can you stand comfortably? Deferred to later tx    How long can you walk comfortably? Deferred to later tx    Diagnostic tests MRI and EMG    Patient Stated Goals " Get better"    Currently in Pain? No/denies    Pain Score 0-No pain                Therapeutic Exercise:   Continue seated Pulleys flex/abd x4 mins each direction with verbal and contact cueing needed for abd for proper mechanics for proper shoulder movement within a pain free range.    Supine:1) AAROM L shoulder abduction with dowel and therapist assist 45 degs of partial sitting.10 min  2) Supine: bilat shoulder flexion  10 min with dowel. NMES 10 sec on/off ; Supervision to ensure safety  of shoulder stability during the 10 sec on phase. verbal cue and tactile cue throughout FES to prevent shoulder elevation.      Shirt Donning/doffing with SBA for doff, Min A for donning.     UE bike set up: seat 8; L 2; R 5, 2.16mn fwd, 2.55m bwd.      Electric Stim unattended: No charge   EMPI  Continuum: 2 channel parallel set up with 2''/2'' pads located supraspinous, ant deltoid, mid deltoid, and infraspinatus. On/off time: 10sec/10sec. Duration 20 mins. Rate:  35 Hz.  Pulse width: 300 micro-sec. Asymmetric wave from. 3 sec ramp on/off. Intensity 30. Pt skin displayed minor redness that reduced to baseline within 5 mins post tx. PT assist with AAROM ex. (See above)         PT Education - 01/19/21 1437     Education Details Pt educated on increase FES intensity with 10 sec on/off cycle compared to previous 10/30 on/off cycle for optimal rehabiliation.    Person(s) Educated Patient    Methods Explanation;Demonstration;Tactile cues;Verbal cues    Comprehension Verbalized understanding                 PT Long Term Goals - 12/25/20 1625       PT LONG TERM GOAL #1   Title Pt will improve FOTO score to predicted value of 59 to improve independence with ADLs    Baseline 25    Time 8    Period Weeks    Status New    Target Date 02/19/21      PT LONG TERM GOAL #2   Title Pt will improve L shoulder strengt tested via MMT Flex/abd to 2/5 to promote increase LUE use during grooming and feeding.    Baseline 0/5    Time 8    Period Weeks    Status New    Target Date 02/19/21      PT LONG TERM GOAL #3   Title Pt will improve L PROM equal to R to maintain glenohumeral joint health for future functional strength gains.    Baseline L flex105  Abd 68  R flexion/abd 110    Time 8    Period Weeks    Status New    Target Date 02/19/21      PT LONG TERM GOAL #4   Title Pt reports ability to don/doff pants and shirts MI without spouse assist to improve dressing ADL's.     Baseline Spouse current Mod-Max assist during dressing.    Time 8    Period Weeks    Status New    Target Date 02/19/21  Plan - 01/19/21 1452     Clinical Impression Statement Pt displayed excellent tolerance to tx with increase in pulley duration to 4 mins each direction and duty cycle to 10 sec on/off compared to 10/20 on/off cycle. Pt stated decreased pain during abd movement with PT assist for long axis compression at the distal humerus for greater GH approximation. Pt's display mild redness after FES which return to baseline after mins. Pt. will continue to benefit from skilled physical therapy to progress POC to address remaining deficits to facilitate maximum functional capacity for optimal personal health and wellness for ADLs.    Personal Factors and Comorbidities Age;Comorbidity 3+;Time since onset of injury/illness/exacerbation    Examination-Activity Limitations Bed Mobility;Carry;Lift;Dressing;Reach Overhead;Self Feeding;Transfers    Examination-Participation Restrictions Yard Work    Stability/Clinical Decision Making Unstable/Unpredictable    Clinical Decision Making High    Rehab Potential Fair    PT Frequency 2x / week    PT Duration 8 weeks    PT Treatment/Interventions ADLs/Self Care Home Management;Aquatic Therapy;Biofeedback;Cryotherapy;Electrical Stimulation;Moist Heat;Traction;Ultrasound;Fluidtherapy;Contrast Bath;Gait training;Stair training;Functional mobility training;Therapeutic activities;Therapeutic exercise;Balance training;Neuromuscular re-education;Patient/family education;Manual techniques;Passive range of motion;Dry needling;Energy conservation;Splinting;Taping;Joint Manipulations    PT Next Visit Plan Reassess soreness within 48 hrs of tx for proper tissue loading.    PT Home Exercise Plan Access Code: [email or web address]    Consulted and Agree with Plan of Care Patient             Patient will benefit from skilled therapeutic  intervention in order to improve the following deficits and impairments:  Decreased activity tolerance, Decreased coordination, Decreased strength, Impaired UE functional use, Pain, Postural dysfunction, Improper body mechanics, Decreased range of motion, Decreased endurance, Decreased mobility, Decreased skin integrity, Hypomobility, Impaired sensation  Visit Diagnosis: Shoulder weakness  Abnormal posture  Pain of left upper extremity     Problem List Patient Active Problem List   Diagnosis Date Noted   Shingles 10/12/2020   Shoulder pain, left 10/12/2020   AKI (acute kidney injury) ([place]) 10/20/2019   Dilatation of thoracic aorta ([place]) 10/19/2019   Pedal edema 07/11/2019   Daytime somnolence 06/02/2019   Coronary artery disease involving native coronary artery of native heart without angina pectoris 02/08/2019   Chronic HFrEF (heart failure with reduced ejection fraction) ([place]) 02/08/2019   S/P aortic valve replacement 02/08/2019   Postoperative anemia 02/08/2019   S/P CABG x 3 [id number]   Acute systolic congestive heart failure ([place]) 12/21/2018   Biventricular failure ([place]) 12/21/2018   Nonrheumatic aortic valve stenosis 12/21/2018   SOB (shortness of breath) 12/11/2018   Hypoxia 12/11/2018   Shortness of breath 12/11/2018   Right shoulder pain 05/08/2015   Health care maintenance 11/12/2014   BMI 32.0-32.9,adult 06/18/2014   Elevated PSA 02/17/2014   CKD (chronic kidney disease) stage 3, GFR 30-59 ml/min (HCC) 05/21/2012   Gout 05/21/2012   Hypertension 05/02/2012   Hyperlipidemia LDL goal <70 05/02/2012   Diabetes ([place]) 05/02/2012   Pura Spice, PT, DPT # [id number] Fara Olden SPT 01/20/2021, 8:00 AM   [facility] [address] [place], [place], [address] Phone: [phone]   Fax:  [phone]  Name: YUSSEF VONASEK MRN: [id number] Date of Birth: 17-Oct-1930

## 2021-01-21 ENCOUNTER — Ambulatory Visit: Payer: Medicare HMO | Admitting: Physical Therapy

## 2021-01-21 ENCOUNTER — Other Ambulatory Visit: Payer: Self-pay

## 2021-01-21 DIAGNOSIS — R29898 Other symptoms and signs involving the musculoskeletal system: Secondary | ICD-10-CM | POA: Diagnosis not present

## 2021-01-21 DIAGNOSIS — R293 Abnormal posture: Secondary | ICD-10-CM | POA: Diagnosis not present

## 2021-01-21 DIAGNOSIS — M79602 Pain in left arm: Secondary | ICD-10-CM | POA: Diagnosis not present

## 2021-01-22 ENCOUNTER — Encounter: Payer: Self-pay | Admitting: Physical Therapy

## 2021-01-22 NOTE — Therapy (Signed)
[facility] [address] [place], [place], [address] Phone: [phone]   Fax:  [phone]  Physical Therapy Treatment  Patient Details  Name: [name] [name] MRN: [id number] Date of Birth: 01-29-1931 Referring Provider (PT): Ray Church, MD   Encounter Date: 01/21/2021   PT End of Session - 01/22/21 0733     Visit Number 8    Number of Visits 16    Date for PT Re-Evaluation 02/19/21    Authorization - Visit Number 8    Authorization - Number of Visits 10    Progress Note Due on Visit 10    PT Start Time [id number]    PT Stop Time 1513    PT Time Calculation (min) 48 min    Equipment Utilized During Treatment Other (comment)    Activity Tolerance Patient tolerated treatment well;Patient limited by pain    Behavior During Therapy [facility] for tasks assessed/performed             Past Medical History:  Diagnosis Date   Aortic stenosis    Arthritis    right shoulder   BPH (benign prostatic hypertrophy)    Coronary artery disease    Gout    Heart murmur    History of chicken pox    Hypercholesterolemia    Hypertension    Systolic heart failure ([place])    ischemic cardiomyopathy   Wears dentures    full upper and lower    Past Surgical History:  Procedure Laterality Date   AORTIC VALVE REPLACEMENT N/A 01/25/2019   Procedure: AORTIC VALVE REPLACEMENT (AVR) 53m Inspiris valve;  Surgeon: BGaye Pollack MD;  [facility];  Service: Open Heart Surgery;  Laterality: N/A;   arm surgery     right arm fx s/p "plate insertion"   CARDIAC CATHETERIZATION     CATARACT EXTRACTION W/PHACO Right 11/17/2016   Procedure: CATARACT EXTRACTION PHACO AND INTRAOCULAR LENS PLACEMENT ([place]  Right;  Surgeon: BLeandrew Koyanagi MD;  [place]  Service: Ophthalmology;  Laterality: Right;   CORONARY ARTERY BYPASS GRAFT N/A 01/25/2019   LIMA-LAD and sequential SVG-rPDA-rPL   DENTAL SURGERY     all teeth  extracted   RIGHT HEART CATH AND CORONARY ANGIOGRAPHY N/A 01/02/2019   Procedure: RIGHT HEART CATH AND CORONARY ANGIOGRAPHY;  Surgeon: ENelva Bush MD;  [facility];  Service: Cardiovascular;  Laterality: N/A;   TEE WITHOUT CARDIOVERSION N/A 01/25/2019   Procedure: TRANSESOPHAGEAL ECHOCARDIOGRAM (TEE);  Surgeon: BGaye Pollack MD;  [place]  Service: Open Heart Surgery;  Laterality: N/A;    There were no vitals filed for this visit.   Subjective Assessment - 01/21/21 1505     Subjective Pt presents to tx without any pain and soreness. Pt stated good adherence to isometric HEP. No pain currently at the clinic.    Pertinent History Pt's MRI results were available 11/27/2020 with mild joint changes. Pt reported abnormal EMG studies that was conducted 2-3 weeks ago. Pt is retired from an [employer] that required traveling. Hobbies include yards work and maintaining his home. Pt is R hand dominate. Pt stated moderate aggravation with current information that was made available to him during his current treatment. Pt requested transparency with all test and measures as well as prognosis and progression.    Limitations House hold activities    How long can you sit comfortably? Deferred to later tx    How long can you  stand comfortably? Deferred to later tx    How long can you walk comfortably? Deferred to later tx    Diagnostic tests MRI and EMG    Patient Stated Goals " Get better"    Currently in Pain? No/denies    Pain Score 0-No pain             Therapeutic Exercise:   Continue seated Pulleys flex/abd x4 mins each direction with verbal and contact cueing needed for abd for proper mechanics for proper shoulder movement within a pain free range.     sitting:1) AAROM L shoulder abduction with dowel and therapist assist 45 degs of partial sitting.10 min  2) sitting: bilat shoulder flexion  10 min with dowel. NMES 10 sec on/off ; Supervision to ensure safety of  shoulder stability during the 10 sec on phase. verbal cue and tactile cue throughout FES to prevent shoulder elevation.    Abnormal mass observed in L middle-proximal arm. FES with 2 electrodes placed around the mass performed with up to 34 mA to r/o deltoid/biceps complete tear. No muscle contraction/Fasciculation observed.   Shirt Donning/doffing with SBA for doff, Min A for donning.     UE bike set up: seat 8; L 2; R 5, 71mn fwd, 369m bwd.      Electric Stim unattended: No charge   EMPI  Continuum: 1 channel parallel set up with 2''/2'' pads located ant deltoid, post deltoid. On/off time: 5sec/5sec. Duration 20 mins. Rate:  35 Hz.  Pulse width: 300 micro-sec. Asymmetric wave from. 0 sec ramp on/off. Intensity 35 mA. Pt skin displayed minor redness that reduced to baseline within 5 mins post tx. PT assist with AAROM ex. (See above)        PT Long Term Goals - 12/25/20 1625       PT LONG TERM GOAL #1   Title Pt will improve FOTO score to predicted value of 59 to improve independence with ADLs    Baseline 25    Time 8    Period Weeks    Status New    Target Date 02/19/21      PT LONG TERM GOAL #2   Title Pt will improve L shoulder strengt tested via MMT Flex/abd to 2/5 to promote increase LUE use during grooming and feeding.    Baseline 0/5    Time 8    Period Weeks    Status New    Target Date 02/19/21      PT LONG TERM GOAL #3   Title Pt will improve L PROM equal to R to maintain glenohumeral joint health for future functional strength gains.    Baseline L flex105  Abd 68  R flexion/abd 110    Time 8    Period Weeks    Status New    Target Date 02/19/21      PT LONG TERM GOAL #4   Title Pt reports ability to don/doff pants and shirts MI without spouse assist to improve dressing ADL's.    Baseline Spouse current Mod-Max assist during dressing.    Time 8    Period Weeks    Status New    Target Date 02/19/21                   Plan - 01/22/21 1004      Clinical Impression Statement PT session focused on AAROM with bike and FES. FES up to 34 mA with 2 electrodes placed around the mass performed to r/o  deltoid/biceps complete tear. No muscle contraction/Fasciculation observed. NMES intensity increased by reducing the ramp time and off time, pt tolerated AAROM with NMES without pain. No observed AAROM improvement with flexion and abduction. Future session will focus on shoulder AAROM and NMES to facilitate nerve activation.    Personal Factors and Comorbidities Age;Comorbidity 3+;Time since onset of injury/illness/exacerbation    Examination-Activity Limitations Bed Mobility;Carry;Lift;Dressing;Reach Overhead;Self Feeding;Transfers    Examination-Participation Restrictions Yard Work    Stability/Clinical Decision Making Unstable/Unpredictable    Clinical Decision Making High    Rehab Potential Fair    PT Frequency 2x / week    PT Duration 8 weeks    PT Treatment/Interventions ADLs/Self Care Home Management;Aquatic Therapy;Biofeedback;Cryotherapy;Electrical Stimulation;Moist Heat;Traction;Ultrasound;Fluidtherapy;Contrast Bath;Gait training;Stair training;Functional mobility training;Therapeutic activities;Therapeutic exercise;Balance training;Neuromuscular re-education;Patient/family education;Manual techniques;Passive range of motion;Dry needling;Energy conservation;Splinting;Taping;Joint Manipulations    PT Next Visit Plan Reassess soreness within 48 hrs of tx for proper tissue loading.    PT Home Exercise Plan Access Code: [email or web address]    Consulted and Agree with Plan of Care Patient             Patient will benefit from skilled therapeutic intervention in order to improve the following deficits and impairments:  Decreased activity tolerance, Decreased coordination, Decreased strength, Impaired UE functional use, Pain, Postural dysfunction, Improper body mechanics, Decreased range of motion, Decreased endurance, Decreased mobility, Decreased skin  integrity, Hypomobility, Impaired sensation  Visit Diagnosis: Shoulder weakness  Pain of left upper extremity  Abnormal posture     Problem List Patient Active Problem List   Diagnosis Date Noted   Shingles 10/12/2020   Shoulder pain, left 10/12/2020   AKI (acute kidney injury) ([place]) 10/20/2019   Dilatation of thoracic aorta ([place]) 10/19/2019   Pedal edema 07/11/2019   Daytime somnolence 06/02/2019   Coronary artery disease involving native coronary artery of native heart without angina pectoris 02/08/2019   Chronic HFrEF (heart failure with reduced ejection fraction) ([place]) 02/08/2019   S/P aortic valve replacement 02/08/2019   Postoperative anemia 02/08/2019   S/P CABG x 3 [id number]   Acute systolic congestive heart failure ([place]) 12/21/2018   Biventricular failure ([place]) 12/21/2018   Nonrheumatic aortic valve stenosis 12/21/2018   SOB (shortness of breath) 12/11/2018   Hypoxia 12/11/2018   Shortness of breath 12/11/2018   Right shoulder pain 05/08/2015   Health care maintenance 11/12/2014   BMI 32.0-32.9,adult 06/18/2014   Elevated PSA 02/17/2014   CKD (chronic kidney disease) stage 3, GFR 30-59 ml/min (HCC) 05/21/2012   Gout 05/21/2012   Hypertension 05/02/2012   Hyperlipidemia LDL goal <70 05/02/2012   Diabetes ([place]) 05/02/2012   Pura Spice, PT, DPT # [id number] Shirley Friar, SPT 01/22/2021, 11:26 AM   [facility] [address] [place], [place], [address] Phone: [phone]   Fax:  [phone]  Name: [name] [name] MRN: [id number] Date of Birth: 15-Dec-1930

## 2021-01-26 ENCOUNTER — Ambulatory Visit: Payer: Medicare HMO | Admitting: Physical Therapy

## 2021-01-26 ENCOUNTER — Other Ambulatory Visit: Payer: Self-pay | Admitting: Physician Assistant

## 2021-01-26 ENCOUNTER — Other Ambulatory Visit: Payer: Self-pay

## 2021-01-26 DIAGNOSIS — R293 Abnormal posture: Secondary | ICD-10-CM

## 2021-01-26 DIAGNOSIS — R29898 Other symptoms and signs involving the musculoskeletal system: Secondary | ICD-10-CM | POA: Diagnosis not present

## 2021-01-26 DIAGNOSIS — M79602 Pain in left arm: Secondary | ICD-10-CM | POA: Diagnosis not present

## 2021-01-26 NOTE — Therapy (Signed)
[place] [facility] [address] [place], [place], [address] Phone: [phone]   Fax:  [phone]  Physical Therapy Treatment  Patient Details  Name: [name] [name] MRN: [id number] Date of Birth: [birth year] Referring Provider (PT): Ray Church, MD   Encounter Date: 01/26/2021   PT End of Session - 01/26/21 1432     Visit Number 9    Number of Visits 16    Date for PT Re-Evaluation 02/19/21    Authorization - Visit Number 9    Authorization - Number of Visits 10    Progress Note Due on Visit 10    PT Start Time [id number]    PT Stop Time [id number]    PT Time Calculation (min) 54 min    Equipment Utilized During Treatment Other (comment)    Activity Tolerance Patient tolerated treatment well;Patient limited by pain    Behavior During Therapy [facility] for tasks assessed/performed             Past Medical History:  Diagnosis Date   Aortic stenosis    Arthritis    right shoulder   BPH (benign prostatic hypertrophy)    Coronary artery disease    Gout    Heart murmur    History of chicken pox    Hypercholesterolemia    Hypertension    Systolic heart failure ([place])    ischemic cardiomyopathy   Wears dentures    full upper and lower    Past Surgical History:  Procedure Laterality Date   AORTIC VALVE REPLACEMENT N/A 01/25/2019   Procedure: AORTIC VALVE REPLACEMENT (AVR) 62m Inspiris valve;  Surgeon: BGaye Pollack MD;  [facility]: [facility];  Service: Open Heart Surgery;  Laterality: N/A;   arm surgery     right arm fx s/p "plate insertion"   CARDIAC CATHETERIZATION     CATARACT EXTRACTION W/PHACO Right 11/17/2016   Procedure: CATARACT EXTRACTION PHACO AND INTRAOCULAR LENS PLACEMENT ([place]  Right;  Surgeon: BLeandrew Koyanagi MD;  [place]  Service: Ophthalmology;  Laterality: Right;   CORONARY ARTERY BYPASS GRAFT N/A 01/25/2019   LIMA-LAD and sequential SVG-rPDA-rPL   DENTAL SURGERY     all teeth  extracted   RIGHT HEART CATH AND CORONARY ANGIOGRAPHY N/A 01/02/2019   Procedure: RIGHT HEART CATH AND CORONARY ANGIOGRAPHY;  Surgeon: ENelva Bush MD;  [facility];  Service: Cardiovascular;  Laterality: N/A;   TEE WITHOUT CARDIOVERSION N/A 01/25/2019   Procedure: TRANSESOPHAGEAL ECHOCARDIOGRAM (TEE);  Surgeon: BGaye Pollack MD;  [place]  Service: Open Heart Surgery;  Laterality: N/A;    There were no vitals filed for this visit.   Subjective Assessment - 01/26/21 1426     Subjective Pt continues to report no pain in L shoulder but has "zinger" sensation occasionally in biceps and down to fingers. Pt reports L thumb has been numb since shingle break out. Neurologist visit on 08/31.    Pertinent History Pt's MRI results were available 11/27/2020 with mild joint changes. Pt reported abnormal EMG studies that was conducted 2-3 weeks ago. Pt is retired from an [employer] that required traveling. Hobbies include yards work and maintaining his home. Pt is R hand dominate. Pt stated moderate aggravation with current information that was made available to him during his current treatment. Pt requested transparency with all test and measures as well as prognosis and progression.    Limitations House hold activities    How long can you  sit comfortably? Deferred to later tx    How long can you stand comfortably? Deferred to later tx    How long can you walk comfortably? Deferred to later tx    Diagnostic tests MRI and EMG    Patient Stated Goals " Get better"    Currently in Pain? No/denies             MMT R Shoulder  Flexion: 2-/5 Abduction: 2-/5  AROM R shoulder: deg. Sitting Flex: 0 Abd: 0 Supine:  Flex: 29 Abd: 20 ER: 10  PROM R shoulder: deg. Supine:  Flex: 109 Abd: 101 ER: 26  Therapeutic Exercise:      Sitting:1) AAROM L shoulder abduction with dowel and therapist assist 45 degs of partial sitting.10 min  2) sitting: bilat shoulder  flexion  10 min with dowel. NMES 5sec on/off ; Supervision to ensure safety of shoulder stability during the 10 sec on phase. 3) Supine: dowel ER to L shoulder--tactile cues required to prevent elbow extension. Continue verbal cue and tactile cue throughout FES to prevent shoulder elevation.    Abnormal mass observed in L middle-proximal arm. FES with 2 electrodes placed around the mass performed with up to 34 mA to r/o deltoid/biceps complete tear. No muscle contraction/Fasciculation observed.   Shirt Donning/doffing with SBA for doff, Min A for donning.     UE bike set up: seat 8; L 4; R 5, 48mn fwd, 381m bwd.      Electric Stim unattended: No charge   EMPI  Continuum: 10 min: 1 channel parallel set up with 2''/2'' pads located Origin and insertion of middle deltoid. On/off time: 5sec/5sec.10 min: 1 channel parallel set up with 2''/2'' pads located Origin and insertion of infraspinatus. On/off time: 5sec/5seDuration 20 mins. Rate:  35 Hz.  Pulse width: 300 micro-sec. Asymmetric wave from. 0 sec ramp on/off. Intensity 35 mA. Pt skin displayed minor redness that reduced to baseline within 5 mins post tx. PT assist with AAROM ex. (See above)     PT Long Term Goals - 12/25/20 1625       PT LONG TERM GOAL #1   Title Pt will improve FOTO score to predicted value of 59 to improve independence with ADLs    Baseline 25    Time 8    Period Weeks    Status New    Target Date 02/19/21      PT LONG TERM GOAL #2   Title Pt will improve L shoulder strengt tested via MMT Flex/abd to 2/5 to promote increase LUE use during grooming and feeding.    Baseline 0/5    Time 8    Period Weeks    Status New    Target Date 02/19/21      PT LONG TERM GOAL #3   Title Pt will improve L PROM equal to R to maintain glenohumeral joint health for future functional strength gains.    Baseline L flex105  Abd 68  R flexion/abd 110    Time 8    Period Weeks    Status New    Target Date 02/19/21      PT LONG  TERM GOAL #4   Title Pt reports ability to don/doff pants and shirts MI without spouse assist to improve dressing ADL's.    Baseline Spouse current Mod-Max assist during dressing.    Time 8    Period Weeks    Status New    Target Date 02/19/21  Plan - 01/26/21 1530     Clinical Impression Statement PT session has focused on utilizing FES to promote muscle activation in L shoulder. Pt demonstrated slight improvement in AROM in gravity eliminated position. No improvement in AROM against gravity. Compensatory movement persists with AROM as indicated by shoulder elevation. Pt and PT agreed on referring to orthopedic specialist to further diagnose mass in posterior L arm.    Personal Factors and Comorbidities Age;Comorbidity 3+;Time since onset of injury/illness/exacerbation    Examination-Activity Limitations Bed Mobility;Carry;Lift;Dressing;Reach Overhead;Self Feeding;Transfers    Examination-Participation Restrictions Yard Work    Stability/Clinical Decision Making Unstable/Unpredictable    Clinical Decision Making High    Rehab Potential Fair    PT Frequency 2x / week    PT Duration 8 weeks    PT Treatment/Interventions ADLs/Self Care Home Management;Aquatic Therapy;Biofeedback;Cryotherapy;Electrical Stimulation;Moist Heat;Traction;Ultrasound;Fluidtherapy;Contrast Bath;Gait training;Stair training;Functional mobility training;Therapeutic activities;Therapeutic exercise;Balance training;Neuromuscular re-education;Patient/family education;Manual techniques;Passive range of motion;Dry needling;Energy conservation;Splinting;Taping;Joint Manipulations    PT Next Visit Plan Reassess soreness within 48 hrs of tx for proper tissue loading.  Check on Ortho MD referral.  10th visit progress noted next tx. session.    PT Home Exercise Plan Access Code: [email or web address]    Consulted and Agree with Plan of Care Patient             Patient will benefit from skilled therapeutic  intervention in order to improve the following deficits and impairments:  Decreased activity tolerance, Decreased coordination, Decreased strength, Impaired UE functional use, Pain, Postural dysfunction, Improper body mechanics, Decreased range of motion, Decreased endurance, Decreased mobility, Decreased skin integrity, Hypomobility, Impaired sensation  Visit Diagnosis: Shoulder weakness  Pain of left upper extremity  Abnormal posture     Problem List Patient Active Problem List   Diagnosis Date Noted   Shingles 10/12/2020   Shoulder pain, left 10/12/2020   AKI (acute kidney injury) ([place]) 10/20/2019   Dilatation of thoracic aorta ([place]) 10/19/2019   Pedal edema 07/11/2019   Daytime somnolence 06/02/2019   Coronary artery disease involving native coronary artery of native heart without angina pectoris 02/08/2019   Chronic HFrEF (heart failure with reduced ejection fraction) ([place]) 02/08/2019   S/P aortic valve replacement 02/08/2019   Postoperative anemia 02/08/2019   S/P CABG x 3 [id number]   Acute systolic congestive heart failure ([place]) 12/21/2018   Biventricular failure ([place]) 12/21/2018   Nonrheumatic aortic valve stenosis 12/21/2018   SOB (shortness of breath) 12/11/2018   Hypoxia 12/11/2018   Shortness of breath 12/11/2018   Right shoulder pain 05/08/2015   Health care maintenance 11/12/2014   BMI 32.0-32.9,adult 06/18/2014   Elevated PSA 02/17/2014   CKD (chronic kidney disease) stage 3, GFR 30-59 ml/min (HCC) 05/21/2012   Gout 05/21/2012   Hypertension 05/02/2012   Hyperlipidemia LDL goal <70 05/02/2012   Diabetes ([place]) 05/02/2012   Pura Spice, PT, DPT # [id number] Shirley Friar, SPT 01/27/2021, 12:56 PM  [place] [facility] [address] [place], [place], [address] Phone: [phone]   Fax:  [phone]  Name: [name] [name] MRN: [id number] Date of Birth: 10/09/1930

## 2021-01-27 ENCOUNTER — Encounter: Payer: Self-pay | Admitting: Physical Therapy

## 2021-01-28 ENCOUNTER — Ambulatory Visit: Payer: Medicare HMO | Admitting: Physical Therapy

## 2021-01-28 ENCOUNTER — Encounter: Payer: Self-pay | Admitting: Internal Medicine

## 2021-01-28 ENCOUNTER — Encounter: Payer: Self-pay | Admitting: Physical Therapy

## 2021-01-28 ENCOUNTER — Other Ambulatory Visit: Payer: Self-pay

## 2021-01-28 ENCOUNTER — Ambulatory Visit: Payer: Medicare HMO | Admitting: Internal Medicine

## 2021-01-28 VITALS — BP 140/80 | HR 66 | Ht 70.0 in | Wt 225.0 lb

## 2021-01-28 DIAGNOSIS — E785 Hyperlipidemia, unspecified: Secondary | ICD-10-CM | POA: Diagnosis not present

## 2021-01-28 DIAGNOSIS — M79602 Pain in left arm: Secondary | ICD-10-CM

## 2021-01-28 DIAGNOSIS — Z952 Presence of prosthetic heart valve: Secondary | ICD-10-CM

## 2021-01-28 DIAGNOSIS — R293 Abnormal posture: Secondary | ICD-10-CM

## 2021-01-28 DIAGNOSIS — I350 Nonrheumatic aortic (valve) stenosis: Secondary | ICD-10-CM

## 2021-01-28 DIAGNOSIS — I10 Essential (primary) hypertension: Secondary | ICD-10-CM

## 2021-01-28 DIAGNOSIS — R29898 Other symptoms and signs involving the musculoskeletal system: Secondary | ICD-10-CM | POA: Diagnosis not present

## 2021-01-28 DIAGNOSIS — I2510 Atherosclerotic heart disease of native coronary artery without angina pectoris: Secondary | ICD-10-CM | POA: Diagnosis not present

## 2021-01-28 NOTE — Patient Instructions (Signed)

## 2021-01-28 NOTE — Progress Notes (Signed)
Follow-up Outpatient Visit Date: 01/28/2021  Primary Care Provider: Einar Pheasant, [place] [id number] [place] [id number]  Chief Complaint: Left arm weakness  HPI:  [name] [name] is a 87 y.o. male with history of coronary artery disease and systolic heart failure secondary to ischemic cardiomyopathy complicated by severe aortic stenosis status post CABG (LIMA to LAD and sequential SVG to RPDA and RPL) and bioprosthetic aortic valve replacement on 01/25/2019, hypertension, hyperlipidemia, diabetes mellitus, asthma, arthritis, gout, and BPH, who presents for follow-up of CAD, cardiomyopathy, and aortic stenosis.  He was last seen in our office in February by Christell Faith, PA, at which time he was doing well from a heart standpoint.  No medication changes or additional testing were pursued.  Today, [name] [name]'s main complaint is of weakness involving the left arm that began after he developed shingles a couple of months ago.  He now has difficulty abducting the left arm beyond approximately 20 degrees.  He is currently following with neurology and physical therapy and is also due to see an orthopedist.  He has occasional "stinging" pains near the left shoulder.  He is on gabapentin but feels like the medication is not agreeing with him.  He states that it affects his sleep and makes him "not feel right."  He plans to address this with his neurologist at their visit later this month.  From a heart standpoint, [name] [name] has been doing well without chest pain, shortness of breath, palpitations, or lightheadedness.  Chronic intermittent right calf edema since his SVG harvest is unchanged.  --------------------------------------------------------------------------------------------------  Past Medical History:  Diagnosis Date   Aortic stenosis    Arthritis    right shoulder   BPH (benign prostatic hypertrophy)    Coronary artery disease    Gout    Heart murmur    History of  chicken pox    Hypercholesterolemia    Hypertension    Systolic heart failure (HCC)    ischemic cardiomyopathy   Wears dentures    full upper and lower   Past Surgical History:  Procedure Laterality Date   AORTIC VALVE REPLACEMENT N/A 01/25/2019   Procedure: AORTIC VALVE REPLACEMENT (AVR) 41m Inspiris valve;  Surgeon: BGaye Pollack MD;  [facility];  Service: Open Heart Surgery;  Laterality: N/A;   arm surgery     right arm fx s/p "plate insertion"   CARDIAC CATHETERIZATION     CATARACT EXTRACTION W/PHACO Right 11/17/2016   Procedure: CATARACT EXTRACTION PHACO AND INTRAOCULAR LENS PLACEMENT ([place]  Right;  Surgeon: BLeandrew Koyanagi MD;  [place]  Service: Ophthalmology;  Laterality: Right;   CORONARY ARTERY BYPASS GRAFT N/A 01/25/2019   LIMA-LAD and sequential SVG-rPDA-rPL   DENTAL SURGERY     all teeth extracted   RIGHT HEART CATH AND CORONARY ANGIOGRAPHY N/A 01/02/2019   Procedure: RIGHT HEART CATH AND CORONARY ANGIOGRAPHY;  Surgeon: ENelva Bush MD;  [facility];  Service: Cardiovascular;  Laterality: N/A;   TEE WITHOUT CARDIOVERSION N/A 01/25/2019   Procedure: TRANSESOPHAGEAL ECHOCARDIOGRAM (TEE);  Surgeon: BGaye Pollack MD;  [place] [facility]  Service: Open Heart Surgery;  Laterality: N/A;     Current Meds  Medication Sig   allopurinol (ZYLOPRIM) 100 MG tablet Take two tablets daily   amLODipine (NORVASC) 5 MG tablet TAKE 1 TABLET EVERY DAY   aspirin EC 81 MG tablet Take 1 tablet (81 mg total) by mouth daily.   atorvastatin (LIPITOR) 80 MG tablet TAKE  1 TABLET EVERY MORNING   carvedilol (COREG) 6.25 MG tablet TAKE 1 TABLET TWICE DAILY WITH A MEAL   cyanocobalamin (,VITAMIN B-12,) 1000 MCG/ML injection Inject 1,000 mcg into the muscle every 30 (thirty) days.   ergocalciferol (VITAMIN D2) 1.25 MG (50000 UT) capsule Take 1 capsule by mouth once a week.   gabapentin (NEURONTIN) 300 MG capsule Take 300 mg by mouth at bedtime.    LORazepam (ATIVAN) 1 MG tablet Takes 1/2 to 1 tablet q day prn   losartan (COZAAR) 100 MG tablet TAKE 1 TABLET (100 MG TOTAL) BY MOUTH DAILY.    Allergies: Patient has no known allergies.  Social History   Tobacco Use   Smoking status: Never   Smokeless tobacco: Current    Types: Chew   Tobacco comments:    not ready to quit currently  Vaping Use   Vaping Use: Never used  Substance Use Topics   Alcohol use: No    Alcohol/week: 0.0 standard drinks   Drug use: No    Family History  Problem Relation Age of Onset   Leukemia Father    Congestive Heart Failure Mother    Cancer Daughter        Breast Cancer   Prostate cancer Neg Hx    Colon cancer Neg Hx     Review of Systems: A 12-system review of systems was performed and was negative except as noted in the HPI.  --------------------------------------------------------------------------------------------------  Physical Exam: BP 140/80 (BP Location: Right Arm, Patient Position: Sitting, Cuff Size: Large)   Pulse 66   Ht '5\' 10"'$  (1.778 m)   Wt 225 lb (102.1 kg)   SpO2 98%   BMI 32.28 kg/m  Repeat BP: 134/80  General:  NAD. Neck: No JVD or HJR. Lungs: Clear to auscultation bilaterally without wheezes or crackles. Heart: Regular rate and rhythm with 1/6 systolic murmur. Abdomen: Soft, nontender, nondistended. Extremities: Trace right calf edema. Neuro: Unable to abduct right arm beyond 20 degrees.  EKG: Normal sinus rhythm with sinus arrhythmia, left axis deviation, and LVH with borderline QRS widening.  No significant change from previous tracing on 10/17/2020.  Lab Results  Component Value Date   WBC 9.6 05/23/2020   HGB 13.2 05/23/2020   HCT 39.4 05/23/2020   MCV 99.6 05/23/2020   PLT 166.0 05/23/2020    Lab Results  Component Value Date   NA 138 10/16/2020   K 3.5 10/31/2020   CL 100 10/16/2020   CO2 28 10/16/2020   BUN 16 10/16/2020   CREATININE 1.05 10/16/2020   GLUCOSE 90 10/16/2020   ALT 25  10/16/2020    Lab Results  Component Value Date   CHOL 78 10/16/2020   HDL 24.20 (L) 10/16/2020   LDLCALC 42 10/16/2020   TRIG 57.0 10/16/2020   CHOLHDL 3 10/16/2020    --------------------------------------------------------------------------------------------------  ASSESSMENT AND PLAN: Coronary artery disease: Mr. Toupin continues to do well from a heart standpoint following CABG/AVR without recurrent angina.  Continue current medications for secondary prevention, including aspirin, atorvastatin, carvedilol, and amlodipine.  Aortic stenosis status post bioprosthetic AVR: No signs or symptoms of heart failure or valve dysfunction.  Echocardiogram earlier this year with appropriate valve function.  Continue low-dose aspirin.  No need for SBE prophylaxis as the patient wears dentures.  Hypertension: Blood pressure borderline today.  We we will continue current regimen of metoprolol amlodipine, and losartan.  Sodium restriction encouraged.  Hyperlipidemia: LDL well controlled on last check in April.  Continue atorvastatin 80 mg daily.  Left arm weakness: Attributed to her recent shingles.  Continue gabapentin for now pending follow-up with neurology later this month.  Follow-up: Return to clinic in 6 months.  Nelva Bush, MD 01/28/2021 11:45 AM

## 2021-01-28 NOTE — Therapy (Signed)
[facility] [address] [place], [place], [address] Phone: [phone]   Fax:  [phone]  Physical Therapy Treatment Physical Therapy Progress Note   Dates of reporting period  7/72022  to  01/28/2021  Patient Details  Name: [name] [name] MRN: [id number] Date of Birth: 1930-10-22 Referring Provider (PT): Ray Church, MD   Encounter Date: 01/28/2021   PT End of Session - 01/28/21 1726     Visit Number 10    Number of Visits 16    Date for PT Re-Evaluation 02/19/21    Authorization - Visit Number 10    Authorization - Number of Visits 10    Progress Note Due on Visit 10    PT Start Time 1426    PT Stop Time 8891    PT Time Calculation (min) 50 min    Equipment Utilized During Treatment Other (comment)    Activity Tolerance Patient tolerated treatment well;Patient limited by pain    Behavior During Therapy [facility] for tasks assessed/performed             Past Medical History:  Diagnosis Date   Aortic stenosis    Arthritis    right shoulder   BPH (benign prostatic hypertrophy)    Coronary artery disease    Gout    Heart murmur    History of chicken pox    Hypercholesterolemia    Hypertension    Systolic heart failure ([place])    ischemic cardiomyopathy   Wears dentures    full upper and lower    Past Surgical History:  Procedure Laterality Date   AORTIC VALVE REPLACEMENT N/A 01/25/2019   Procedure: AORTIC VALVE REPLACEMENT (AVR) 41mm Inspiris valve;  Surgeon: Gaye Pollack, MD;  [facility];  Service: Open Heart Surgery;  Laterality: N/A;   arm surgery     right arm fx s/p "plate insertion"   CARDIAC CATHETERIZATION     CATARACT EXTRACTION W/PHACO Right 11/17/2016   Procedure: CATARACT EXTRACTION PHACO AND INTRAOCULAR LENS PLACEMENT ([place])  Right;  Surgeon: Leandrew Koyanagi, MD;  Location: [place];  Service: Ophthalmology;  Laterality: Right;   CORONARY ARTERY BYPASS GRAFT N/A  01/25/2019   LIMA-LAD and sequential SVG-rPDA-rPL   DENTAL SURGERY     all teeth extracted   RIGHT HEART CATH AND CORONARY ANGIOGRAPHY N/A 01/02/2019   Procedure: RIGHT HEART CATH AND CORONARY ANGIOGRAPHY;  Surgeon: Nelva Bush, MD;  Location: [place] [facility];  Service: Cardiovascular;  Laterality: N/A;   TEE WITHOUT CARDIOVERSION N/A 01/25/2019   Procedure: TRANSESOPHAGEAL ECHOCARDIOGRAM (TEE);  Surgeon: Gaye Pollack, MD;  Location: [place];  Service: Open Heart Surgery;  Laterality: N/A;    There were no vitals filed for this visit.   Subjective Assessment - 01/28/21 1437     Subjective Pt continues to report no pain in L shoulder but has "zinger" sensation mostly in the L biceps. Neurologist visit on 08/31.    Pertinent History Pt's MRI results were available 11/27/2020 with mild joint changes. Pt reported abnormal EMG studies that was conducted 2-3 weeks ago. Pt is retired from an [employer] job that required traveling. Hobbies include yards work and maintaining his home. Pt is R hand dominate. Pt stated moderate aggravation with current information that was made available to him during his current treatment. Pt requested transparency with all test and measures as well as prognosis and progression.    Limitations House hold activities    How  long can you sit comfortably? Deferred to later tx    How long can you stand comfortably? Deferred to later tx    How long can you walk comfortably? Deferred to later tx    Diagnostic tests MRI and EMG    Patient Stated Goals " Get better"    Currently in Pain? No/denies                Therapeutic Exercise:   Sitting:1) AAROM L shoulder abduction with dowel and therapist assist 45 degs of partial sitting.10 min  2) sitting: bilat shoulder flexion  10 min with dowel. NMES 5sec on/off ; Supervision to ensure safety of shoulder stability during the 10 sec on phase. 3) Supine: dowel ER to L shoulder--tactile cues required to prevent elbow  extension. Continue verbal cue and tactile cue throughout FES to prevent shoulder elevation.    Multiple pad placement attempted around insertion and origin of RC muscles and deltoid. Unable to elicit contraction/fasiciculation.     Shirt Donning/doffing with SBA for doff, Min A for donning.     UE bike set up: seat 8; L 4; R 5, fwd, bwd.      Electric Stim unattended: No charge   EMPI  Continuum: 10 min: 1 channel parallel set up with 2''/2'' pads located Origin and insertion of middle deltoid. On/off time: 5sec/5sec.10 min: 1 channel parallel set up with 2''/2'' pads located Origin and insertion of infraspinatus. On/off time: 5sec/5seDuration 20 mins. Rate:  35 Hz.  Pulse width: 300 micro-sec. Asymmetric wave from. 0 sec ramp on/off. Intensity 35 mA. Pt skin displayed minor redness that reduced to baseline within 5 mins post tx. PT assist with AAROM ex. (See above)     PT Long Term Goals - 01/28/21 1724       PT LONG TERM GOAL #1   Title Pt will improve FOTO score to predicted value of 59 to improve independence with ADLs    Baseline 25    Time 8    Period Weeks    Status Not Met    Target Date 02/19/21      PT LONG TERM GOAL #2   Title Pt will improve L shoulder strengt tested via MMT Flex/abd to 2/5 to promote increase LUE use during grooming and feeding.    Baseline 0/5 08/10: MMT R Shoulder   Flexion: 2-/5  Abduction: 2-/5  in supine    Time 8    Period Weeks    Status Partially Met    Target Date 02/19/21      PT LONG TERM GOAL #3   Title Pt will improve L PROM equal to R to maintain glenohumeral joint health for future functional strength gains.    Baseline L flex105  Abd 68  R flexion/abd 110; 08/10 L shoulder: AROM L shoulder: deg.  Sitting  Flex: 0  Abd: 0  Supine:  Flex: 29  Abd: 20  ER: 10     PROM L shoulder: deg.  Supine:  Flex: 109  Abd: 101  ER: 26    Time 8    Period Weeks    Status Partially Met    Target Date 02/19/21      PT LONG TERM GOAL #4    Title Pt reports ability to don/doff pants and shirts MI without spouse assist to improve dressing ADL's.    Baseline Spouse current Mod-Max assist during dressing.    Time 8    Period Weeks  Status On-going    Target Date 02/19/21                   Plan - 01/28/21 1759     Clinical Impression Statement Session focused on maintain PROM with pulley and UE bike as well as FES experimentation to elicit observable muscle fasciculation. PROM in L shoulder has improved with HEP and UE bike to almost equal to R shoulder. Still no AROM against gravity. Pt continued to use L scapula elevation to compensate with shoulder movement. Pt and PT discussed and agreed on referral plan to orthopedics.    Personal Factors and Comorbidities Age;Comorbidity 3+;Time since onset of injury/illness/exacerbation    Examination-Activity Limitations Bed Mobility;Carry;Lift;Dressing;Reach Overhead;Self Feeding;Transfers    Examination-Participation Restrictions Yard Work    Stability/Clinical Decision Making Unstable/Unpredictable    Rehab Potential Fair    PT Frequency 2x / week    PT Duration 8 weeks    PT Treatment/Interventions ADLs/Self Care Home Management;Aquatic Therapy;Biofeedback;Cryotherapy;Electrical Stimulation;Moist Heat;Traction;Ultrasound;Fluidtherapy;Contrast Bath;Gait training;Stair training;Functional mobility training;Therapeutic activities;Therapeutic exercise;Balance training;Neuromuscular re-education;Patient/family education;Manual techniques;Passive range of motion;Dry needling;Energy conservation;Splinting;Taping;Joint Manipulations    PT Next Visit Plan Reassess soreness within 48 hrs of tx for proper tissue loading.  Check on Ortho MD referral.  10th visit progress noted next tx. session.    PT Home Exercise Plan Access Code: [email or web address]    Consulted and Agree with Plan of Care Patient             Patient will benefit from skilled therapeutic intervention in order to improve  the following deficits and impairments:  Decreased activity tolerance, Decreased coordination, Decreased strength, Impaired UE functional use, Pain, Postural dysfunction, Improper body mechanics, Decreased range of motion, Decreased endurance, Decreased mobility, Decreased skin integrity, Hypomobility, Impaired sensation  Visit Diagnosis: Shoulder weakness  Pain of left upper extremity  Abnormal posture     Problem List Patient Active Problem List   Diagnosis Date Noted   Shingles 10/12/2020   Shoulder pain, left 10/12/2020   AKI (acute kidney injury) ([place]) 10/20/2019   Dilatation of thoracic aorta ([place]) 10/19/2019   Pedal edema 07/11/2019   Daytime somnolence 06/02/2019   Coronary artery disease involving native coronary artery of native heart without angina pectoris 02/08/2019   Chronic HFrEF (heart failure with reduced ejection fraction) ([place]) 02/08/2019   S/P aortic valve replacement 02/08/2019   Postoperative anemia 02/08/2019   S/P CABG x 3 33/83/2919   Acute systolic congestive heart failure ([place]) 12/21/2018   Biventricular failure ([place]) 12/21/2018   Nonrheumatic aortic valve stenosis 12/21/2018   SOB (shortness of breath) 12/11/2018   Hypoxia 12/11/2018   Shortness of breath 12/11/2018   Right shoulder pain 05/08/2015   Health care maintenance 11/12/2014   BMI 32.0-32.9,adult 06/18/2014   Elevated PSA 02/17/2014   CKD (chronic kidney disease) stage 3, GFR 30-59 ml/min (HCC) 05/21/2012   Gout 05/21/2012   Hypertension 05/02/2012   Hyperlipidemia LDL goal <70 05/02/2012   Diabetes ([place]) 05/02/2012   Pura Spice, PT, DPT # [id number] Shirley Friar, SPT 01/29/2021, 10:44 AM  [facility] [address] [place], [place], [address] Phone: [phone]   Fax:  [phone]  Name: [name] [name] MRN: [id number] Date of Birth: 1930-07-03

## 2021-02-02 ENCOUNTER — Other Ambulatory Visit: Payer: Self-pay

## 2021-02-02 ENCOUNTER — Ambulatory Visit: Payer: Medicare HMO | Admitting: Physical Therapy

## 2021-02-02 DIAGNOSIS — R29898 Other symptoms and signs involving the musculoskeletal system: Secondary | ICD-10-CM | POA: Diagnosis not present

## 2021-02-02 DIAGNOSIS — R293 Abnormal posture: Secondary | ICD-10-CM | POA: Diagnosis not present

## 2021-02-02 DIAGNOSIS — M79602 Pain in left arm: Secondary | ICD-10-CM | POA: Diagnosis not present

## 2021-02-03 ENCOUNTER — Encounter: Payer: Self-pay | Admitting: Physical Therapy

## 2021-02-03 DIAGNOSIS — E538 Deficiency of other specified B group vitamins: Secondary | ICD-10-CM | POA: Diagnosis not present

## 2021-02-03 NOTE — Therapy (Signed)
[facility] [address] [place], [place], [address] Phone: [phone]   Fax:  [phone]  Physical Therapy Treatment  Patient Details  Name: [name] [name] MRN: [id number] Date of Birth: July 19, 1930 Referring Provider (PT): Ray Church, MD   Encounter Date: 02/02/2021   PT End of Session - 02/03/21 0902     Visit Number 11    Number of Visits 16    Date for PT Re-Evaluation 02/19/21    Authorization - Visit Number 1    Authorization - Number of Visits 10    Progress Note Due on Visit 10    PT Start Time 5681    PT Stop Time 1516    PT Time Calculation (min) 48 min    Equipment Utilized During Treatment Other (comment)    Activity Tolerance Patient tolerated treatment well;Patient limited by pain    Behavior During Therapy [facility] for tasks assessed/performed             Past Medical History:  Diagnosis Date   Aortic stenosis    Arthritis    right shoulder   BPH (benign prostatic hypertrophy)    Coronary artery disease    Gout    Heart murmur    History of chicken pox    Hypercholesterolemia    Hypertension    Systolic heart failure ([place])    ischemic cardiomyopathy   Wears dentures    full upper and lower    Past Surgical History:  Procedure Laterality Date   AORTIC VALVE REPLACEMENT N/A 01/25/2019   Procedure: AORTIC VALVE REPLACEMENT (AVR) 39m Inspiris valve;  Surgeon: BGaye Pollack MD;  [facility];  Service: Open Heart Surgery;  Laterality: N/A;   arm surgery     right arm fx s/p "plate insertion"   CARDIAC CATHETERIZATION     CATARACT EXTRACTION W/PHACO Right 11/17/2016   Procedure: CATARACT EXTRACTION PHACO AND INTRAOCULAR LENS PLACEMENT ([place]  Right;  Surgeon: BLeandrew Koyanagi MD;  [place]  Service: Ophthalmology;  Laterality: Right;   CORONARY ARTERY BYPASS GRAFT N/A 01/25/2019   LIMA-LAD and sequential SVG-rPDA-rPL   DENTAL SURGERY     all teeth  extracted   RIGHT HEART CATH AND CORONARY ANGIOGRAPHY N/A 01/02/2019   Procedure: RIGHT HEART CATH AND CORONARY ANGIOGRAPHY;  Surgeon: ENelva Bush MD;  [place];  Service: Cardiovascular;  Laterality: N/A;   TEE WITHOUT CARDIOVERSION N/A 01/25/2019   Procedure: TRANSESOPHAGEAL ECHOCARDIOGRAM (TEE);  Surgeon: BGaye Pollack MD;  [place]  Service: Open Heart Surgery;  Laterality: N/A;    There were no vitals filed for this visit.   Subjective Assessment - 02/03/21 0859     Subjective Pt presents to tx with no updates on increased function of the L UE. Pt stated recent onset of occ L thumb numbness. Pt is hopeful for a ortho referral soon for a better understanding of L shoulder impairments.    Pertinent History Pt's MRI results were available 11/27/2020 with mild joint changes. Pt reported abnormal EMG studies that was conducted 2-3 weeks ago. Pt is retired from an [employer] that required traveling. Hobbies include yards work and maintaining his home. Pt is R hand dominate. Pt stated moderate aggravation with current information that was made available to him during his current treatment. Pt requested transparency with all test and measures as well as prognosis and progression.    Limitations House hold activities  How long can you sit comfortably? Deferred to later tx    How long can you stand comfortably? Deferred to later tx    How long can you walk comfortably? Deferred to later tx    Diagnostic tests MRI and EMG    Patient Stated Goals " Get better"    Currently in Pain? Yes    Pain Score 4     Pain Location Shoulder    Pain Orientation Left    Pain Descriptors / Indicators Aching    Pain Type Acute pain               Therapeutic Exercise:    Supine to partial Sitting up to 50 deg of HEB elevation:1) 4x20 L shoulder flexion and abd with verbal cueing for isometric holds at 90 degs of elevation. Shoulder ER with mod assit at 30 deg of  shoulder abd.   Continue verbal cue, tactile cue, and mod/max assist throughout FES to prevent shoulder elevation.        Shirt Donning/doffing with SBA for doff, Min A for donning.     UE bike set up: seat 8; L 4; R 5, 32mn fwd, 315m bwd.      Electric Stim unattended: No charge   Line-powered E-Stim ([employer] 20 min: 2 channel parallel set up with 2''/2'' pads located supraspinatus and ant deltoid. Infraspinatus and post deltoid. On/off time: 5sec/5sec with alternating reciprocal stimulation . Rate:  35 Hz.  Pulse width: 340 micro-sec. Asymmetric wave from. 2 sec ramp on/off. Intensity 35 mA. Pt skin displayed minor redness that reduced to baseline within 5 mins post tx. PT assist with AAROM ex. (See above)            PT Long Term Goals - 01/28/21 1724       PT LONG TERM GOAL #1   Title Pt will improve FOTO score to predicted value of 59 to improve independence with ADLs    Baseline 25    Time 8    Period Weeks    Status Not Met    Target Date 02/19/21      PT LONG TERM GOAL #2   Title Pt will improve L shoulder strengt tested via MMT Flex/abd to 2/5 to promote increase LUE use during grooming and feeding.    Baseline 0/5 08/10: MMT R Shoulder   Flexion: 2-/5  Abduction: 2-/5  in supine    Time 8    Period Weeks    Status Partially Met    Target Date 02/19/21      PT LONG TERM GOAL #3   Title Pt will improve L PROM equal to R to maintain glenohumeral joint health for future functional strength gains.    Baseline L flex105  Abd 68  R flexion/abd 110; 08/10 L shoulder: AROM L shoulder: deg.  Sitting  Flex: 0  Abd: 0  Supine:  Flex: 29  Abd: 20  ER: 10     PROM L shoulder: deg.  Supine:  Flex: 109  Abd: 101  ER: 26    Time 8    Period Weeks    Status Partially Met    Target Date 02/19/21      PT LONG TERM GOAL #4   Title Pt reports ability to don/doff pants and shirts MI without spouse assist to improve dressing ADL's.    Baseline Spouse current Mod-Max  assist during dressing.    Time 8    Period Weeks  Status On-going    Target Date 02/19/21                   Plan - 02/03/21 0904     Clinical Impression Statement Tx was focused on alternated reciprocal E-stim to facilitate constant stimulation in the shoulder for optimal muscle activation. It is still unclear if muscle activation is facilitated with E-stim. Pt displays frustration with progression and is hopeful to obtain a orthopedic visit for further screening to possibly improve the prognosis. PT will continued at this time. Pt continues to display mark LUE strength, endurance, and stability deficits resulting in poor functional use of the L shoulder. Pt. will continue to benefit from skilled physical therapy to progress POC to address remaining deficits to facilitate maximum functional capacity for optimal personal health and wellness for ADLs.    Personal Factors and Comorbidities Age;Comorbidity 3+;Time since onset of injury/illness/exacerbation    Examination-Activity Limitations Bed Mobility;Carry;Lift;Dressing;Reach Overhead;Self Feeding;Transfers    Examination-Participation Restrictions Yard Work    Stability/Clinical Decision Making Unstable/Unpredictable    Clinical Decision Making High    Rehab Potential Fair    PT Frequency 2x / week    PT Duration 8 weeks    PT Treatment/Interventions ADLs/Self Care Home Management;Aquatic Therapy;Biofeedback;Cryotherapy;Electrical Stimulation;Moist Heat;Traction;Ultrasound;Fluidtherapy;Contrast Bath;Gait training;Stair training;Functional mobility training;Therapeutic activities;Therapeutic exercise;Balance training;Neuromuscular re-education;Patient/family education;Manual techniques;Passive range of motion;Dry needling;Energy conservation;Splinting;Taping;Joint Manipulations    PT Next Visit Plan Reassess soreness within 48 hrs of tx for proper tissue loading.  Check on Ortho MD referral.  10th visit progress noted next tx.  session.    PT Home Exercise Plan Access Code: [email or web address]    Consulted and Agree with Plan of Care Patient             Patient will benefit from skilled therapeutic intervention in order to improve the following deficits and impairments:  Decreased activity tolerance, Decreased coordination, Decreased strength, Impaired UE functional use, Pain, Postural dysfunction, Improper body mechanics, Decreased range of motion, Decreased endurance, Decreased mobility, Decreased skin integrity, Hypomobility, Impaired sensation  Visit Diagnosis: Shoulder weakness  Pain of left upper extremity  Abnormal posture     Problem List Patient Active Problem List   Diagnosis Date Noted   Shingles 10/12/2020   Shoulder pain, left 10/12/2020   AKI (acute kidney injury) ([place]) 10/20/2019   Dilatation of thoracic aorta ([place]) 10/19/2019   Pedal edema 07/11/2019   Daytime somnolence 06/02/2019   Coronary artery disease involving native coronary artery of native heart without angina pectoris 02/08/2019   Chronic HFrEF (heart failure with reduced ejection fraction) ([place]) 02/08/2019   S/P aortic valve replacement 02/08/2019   Postoperative anemia 02/08/2019   S/P CABG x 3 96/78/9381   Acute systolic congestive heart failure ([place]) 12/21/2018   Biventricular failure ([place]) 12/21/2018   Nonrheumatic aortic valve stenosis 12/21/2018   SOB (shortness of breath) 12/11/2018   Hypoxia 12/11/2018   Shortness of breath 12/11/2018   Right shoulder pain 05/08/2015   Health care maintenance 11/12/2014   BMI 32.0-32.9,adult 06/18/2014   Elevated PSA 02/17/2014   CKD (chronic kidney disease) stage 3, GFR 30-59 ml/min (HCC) 05/21/2012   Gout 05/21/2012   Hypertension 05/02/2012   Hyperlipidemia LDL goal <70 05/02/2012   Diabetes ([place]) 05/02/2012   Pura Spice, PT, DPT # [id number] Fara Olden SPT 02/03/2021, 11:48 AM  [facility] [address] [place], [place],  [address] Phone: [phone]   Fax:  [phone]  Name: [name] [name] MRN:  [id number] Date of Birth: 10-Nov-1930

## 2021-02-04 ENCOUNTER — Other Ambulatory Visit: Payer: Self-pay

## 2021-02-04 ENCOUNTER — Ambulatory Visit: Payer: Medicare HMO | Admitting: Physical Therapy

## 2021-02-04 DIAGNOSIS — M79602 Pain in left arm: Secondary | ICD-10-CM | POA: Diagnosis not present

## 2021-02-04 DIAGNOSIS — R293 Abnormal posture: Secondary | ICD-10-CM

## 2021-02-04 DIAGNOSIS — R29898 Other symptoms and signs involving the musculoskeletal system: Secondary | ICD-10-CM

## 2021-02-04 NOTE — Therapy (Signed)
[facility] [address] [place], [place], [address] Phone: [phone]   Fax:  [phone]  Physical Therapy Treatment  Patient Details  Name: [name] [name] MRN: [id number] Date of Birth: 1931/04/05 Referring Provider (PT): Ray Church, MD   Encounter Date: 02/04/2021   PT End of Session - 02/04/21 1526     Visit Number 12    Number of Visits 16    Date for PT Re-Evaluation 02/19/21    Authorization - Visit Number 2    Authorization - Number of Visits 10    Progress Note Due on Visit 10    PT Start Time 1426    PT Stop Time 4496    PT Time Calculation (min) 48 min    Equipment Utilized During Treatment Other (comment)    Activity Tolerance Patient tolerated treatment well;Patient limited by pain    Behavior During Therapy [facility] for tasks assessed/performed             Past Medical History:  Diagnosis Date   Aortic stenosis    Arthritis    right shoulder   BPH (benign prostatic hypertrophy)    Coronary artery disease    Gout    Heart murmur    History of chicken pox    Hypercholesterolemia    Hypertension    Systolic heart failure ([place])    ischemic cardiomyopathy   Wears dentures    full upper and lower    Past Surgical History:  Procedure Laterality Date   AORTIC VALVE REPLACEMENT N/A 01/25/2019   Procedure: AORTIC VALVE REPLACEMENT (AVR) 65mm Inspiris valve;  Surgeon: Gaye Pollack, MD;  [facility];  Service: Open Heart Surgery;  Laterality: N/A;   arm surgery     right arm fx s/p "plate insertion"   CARDIAC CATHETERIZATION     CATARACT EXTRACTION W/PHACO Right 11/17/2016   Procedure: CATARACT EXTRACTION PHACO AND INTRAOCULAR LENS PLACEMENT ([place])  Right;  Surgeon: Leandrew Koyanagi, MD;  Location: [place];  Service: Ophthalmology;  Laterality: Right;   CORONARY ARTERY BYPASS GRAFT N/A 01/25/2019   LIMA-LAD and sequential SVG-rPDA-rPL   DENTAL SURGERY     all teeth  extracted   RIGHT HEART CATH AND CORONARY ANGIOGRAPHY N/A 01/02/2019   Procedure: RIGHT HEART CATH AND CORONARY ANGIOGRAPHY;  Surgeon: Nelva Bush, MD;  Location: [place] [facility];  Service: Cardiovascular;  Laterality: N/A;   TEE WITHOUT CARDIOVERSION N/A 01/25/2019   Procedure: TRANSESOPHAGEAL ECHOCARDIOGRAM (TEE);  Surgeon: Gaye Pollack, MD;  Location: [place];  Service: Open Heart Surgery;  Laterality: N/A;    There were no vitals filed for this visit.   Subjective Assessment - 02/04/21 1523     Subjective Pt present to tx with 3/10 L shoulder soreness. Pt display mark elevation of L scapula compared to R. Pt stated that he received B12 shot yesterday, however, unable to speak with Dr. Manuella Ghazi about an ortho referral.    Pertinent History Pt's MRI results were available 11/27/2020 with mild joint changes. Pt reported abnormal EMG studies that was conducted 2-3 weeks ago. Pt is retired from an [employer] job that required traveling. Hobbies include yards work and maintaining his home. Pt is R hand dominate. Pt stated moderate aggravation with current information that was made available to him during his current treatment. Pt requested transparency with all test and measures as well as prognosis and progression.    Limitations House hold activities    How  long can you sit comfortably? Deferred to later tx    How long can you stand comfortably? Deferred to later tx    How long can you walk comfortably? Deferred to later tx    Diagnostic tests MRI and EMG    Patient Stated Goals " Get better"    Currently in Pain? Yes    Pain Score 3     Pain Location Shoulder    Pain Orientation Left    Pain Descriptors / Indicators Aching    Pain Type Acute pain    Pain Onset More than a month ago              Treatment:  Therapeutic Exercise:   UE bike set up: seat 8; L 4; R 5, 3min fwd, 24min bwd.   Finger ladder x6 flexion/abd/scaption. Therapist was SBA to ensure no rapid dropping of the  LUE. Pt was able to tolerate flexion and scaption to shoulder elevation of ~130 deg, however abd was only able to achieve 90 deg of elevation.   Pulleys: 4 minutes in flexion and abd with verbal cueing to attempt isometric holds of the LUE at end range of elevation.   Finger ladder post Manual intervention: Shoulder abd increased 7 step to equal flexion and scaption elevation levels.    Manual Therapy: 28 mins   Position 1/4 side-lying with pillow support due to inability to lay fully on R side. 1) Grade 2-3 scapular depression with occ sustained holds at end range  2) STM to L infraspinatus, teres minor, upper trap, and levator scapulae.  -alternating bouts between 1 and 2.        PT Education - 02/04/21 1525     Education Details Pt was educated on the roll of scapular mobility to support optimal shoulder movement    Person(s) Educated Patient    Methods Explanation;Demonstration    Comprehension Verbalized understanding;Need further instruction                 PT Long Term Goals - 01/28/21 1724       PT LONG TERM GOAL #1   Title Pt will improve FOTO score to predicted value of 59 to improve independence with ADLs    Baseline 25    Time 8    Period Weeks    Status Not Met    Target Date 02/19/21      PT LONG TERM GOAL #2   Title Pt will improve L shoulder strengt tested via MMT Flex/abd to 2/5 to promote increase LUE use during grooming and feeding.    Baseline 0/5 08/10: MMT R Shoulder   Flexion: 2-/5  Abduction: 2-/5  in supine    Time 8    Period Weeks    Status Partially Met    Target Date 02/19/21      PT LONG TERM GOAL #3   Title Pt will improve L PROM equal to R to maintain glenohumeral joint health for future functional strength gains.    Baseline L flex105  Abd 68  R flexion/abd 110; 08/10 L shoulder: AROM L shoulder: deg.  Sitting  Flex: 0  Abd: 0  Supine:  Flex: 29  Abd: 20  ER: 10     PROM L shoulder: deg.  Supine:  Flex: 109  Abd: 101  ER: 26     Time 8    Period Weeks    Status Partially Met    Target Date 02/19/21  PT LONG TERM GOAL #4   Title Pt reports ability to don/doff pants and shirts MI without spouse assist to improve dressing ADL's.    Baseline Spouse current Mod-Max assist during dressing.    Time 8    Period Weeks    Status On-going    Target Date 02/19/21                   Plan - 02/04/21 1526     Clinical Impression Statement Tx focus was on regaining scapular mobility and decreasing L shoulder stiffness for greater mobility of the complete shoulder complex. Pt stated decrease in shoulder stiffness to 0/10 post manual intervention with reduction in L shoulder elevation to equal R shoulder and 7 additional levels on the finger ladder with abd. Pt continues to display mark LUE weakness and poor muscle support the L GH joint. Pt. will continue to benefit from skilled physical therapy to progress POC to address remaining deficits to facilitate maximum functional capacity for optimal personal health and wellness for ADLs.    Personal Factors and Comorbidities Age;Comorbidity 3+;Time since onset of injury/illness/exacerbation    Examination-Activity Limitations Bed Mobility;Carry;Lift;Dressing;Reach Overhead;Self Feeding;Transfers    Examination-Participation Restrictions Yard Work    Stability/Clinical Decision Making Unstable/Unpredictable    Clinical Decision Making High    Rehab Potential Fair    PT Frequency 2x / week    PT Duration 8 weeks    PT Treatment/Interventions ADLs/Self Care Home Management;Aquatic Therapy;Biofeedback;Cryotherapy;Electrical Stimulation;Moist Heat;Traction;Ultrasound;Fluidtherapy;Contrast Bath;Gait training;Stair training;Functional mobility training;Therapeutic activities;Therapeutic exercise;Balance training;Neuromuscular re-education;Patient/family education;Manual techniques;Passive range of motion;Dry needling;Energy conservation;Splinting;Taping;Joint Manipulations    PT  Next Visit Plan Reassess sx response to manual intervention.    PT Home Exercise Plan Access Code: [email or web address]    Consulted and Agree with Plan of Care Patient             Patient will benefit from skilled therapeutic intervention in order to improve the following deficits and impairments:  Decreased activity tolerance, Decreased coordination, Decreased strength, Impaired UE functional use, Pain, Postural dysfunction, Improper body mechanics, Decreased range of motion, Decreased endurance, Decreased mobility, Decreased skin integrity, Hypomobility, Impaired sensation  Visit Diagnosis: Shoulder weakness  Abnormal posture  Pain of left upper extremity     Problem List Patient Active Problem List   Diagnosis Date Noted   Shingles 10/12/2020   Shoulder pain, left 10/12/2020   AKI (acute kidney injury) ([place]) 10/20/2019   Dilatation of thoracic aorta ([place]) 10/19/2019   Pedal edema 07/11/2019   Daytime somnolence 06/02/2019   Coronary artery disease involving native coronary artery of native heart without angina pectoris 02/08/2019   Chronic HFrEF (heart failure with reduced ejection fraction) ([place]) 02/08/2019   S/P aortic valve replacement 02/08/2019   Postoperative anemia 02/08/2019   S/P CABG x 3 86/76/1950   Acute systolic congestive heart failure ([place]) 12/21/2018   Biventricular failure ([place]) 12/21/2018   Nonrheumatic aortic valve stenosis 12/21/2018   SOB (shortness of breath) 12/11/2018   Hypoxia 12/11/2018   Shortness of breath 12/11/2018   Right shoulder pain 05/08/2015   Health care maintenance 11/12/2014   BMI 32.0-32.9,adult 06/18/2014   Elevated PSA 02/17/2014   CKD (chronic kidney disease) stage 3, GFR 30-59 ml/min (HCC) 05/21/2012   Gout 05/21/2012   Hypertension 05/02/2012   Hyperlipidemia LDL goal <70 05/02/2012   Diabetes ([place]) 05/02/2012   Pura Spice, PT, DPT # [id number] Fara Olden SPT 02/05/2021, 7:59 AM  [place]  [facility]  [address] [place], [place], [address] Phone: [phone]   Fax:  [phone]  Name: [name] [name] MRN: [id number] Date of Birth: 1931-06-05

## 2021-02-04 NOTE — Therapy (Deleted)
[facility] [address] [place], [place], [address] Phone: [phone]   Fax:  [phone]  Physical Therapy Treatment  Patient Details  Name: [name] [name] MRN: [id number] Date of Birth: 1931-02-28 Referring Provider (PT): Ray Church, MD   Encounter Date: 02/04/2021   PT End of Session - 02/04/21 1526     Visit Number 12    Number of Visits 16    Date for PT Re-Evaluation 02/19/21    Authorization - Visit Number 2    Authorization - Number of Visits 10    Progress Note Due on Visit 10    PT Start Time 1426    PT Stop Time 6812    PT Time Calculation (min) 48 min    Equipment Utilized During Treatment Other (comment)    Activity Tolerance Patient tolerated treatment well;Patient limited by pain    Behavior During Therapy [facility] for tasks assessed/performed             Past Medical History:  Diagnosis Date   Aortic stenosis    Arthritis    right shoulder   BPH (benign prostatic hypertrophy)    Coronary artery disease    Gout    Heart murmur    History of chicken pox    Hypercholesterolemia    Hypertension    Systolic heart failure ([place])    ischemic cardiomyopathy   Wears dentures    full upper and lower    Past Surgical History:  Procedure Laterality Date   AORTIC VALVE REPLACEMENT N/A 01/25/2019   Procedure: AORTIC VALVE REPLACEMENT (AVR) 46mm Inspiris valve;  Surgeon: Gaye Pollack, MD;  [facility];  Service: Open Heart Surgery;  Laterality: N/A;   arm surgery     right arm fx s/p "plate insertion"   CARDIAC CATHETERIZATION     CATARACT EXTRACTION W/PHACO Right 11/17/2016   Procedure: CATARACT EXTRACTION PHACO AND INTRAOCULAR LENS PLACEMENT ([place])  Right;  Surgeon: Leandrew Koyanagi, MD;  Location: [place];  Service: Ophthalmology;  Laterality: Right;   CORONARY ARTERY BYPASS GRAFT N/A 01/25/2019   LIMA-LAD and sequential SVG-rPDA-rPL   DENTAL SURGERY     all teeth  extracted   RIGHT HEART CATH AND CORONARY ANGIOGRAPHY N/A 01/02/2019   Procedure: RIGHT HEART CATH AND CORONARY ANGIOGRAPHY;  Surgeon: Nelva Bush, MD;  Location: [place] [facility];  Service: Cardiovascular;  Laterality: N/A;   TEE WITHOUT CARDIOVERSION N/A 01/25/2019   Procedure: TRANSESOPHAGEAL ECHOCARDIOGRAM (TEE);  Surgeon: Gaye Pollack, MD;  Location: [place];  Service: Open Heart Surgery;  Laterality: N/A;    There were no vitals filed for this visit.   Subjective Assessment - 02/04/21 1523     Subjective Pt present to tx with 3/10 L shoulder soreness. Pt display mark elevation of L scapula compared to R. Pt stated that he received B-12 shot yesterday, however, unable to speak with Dr. Manuella Ghazi about an ortho referral.    Pertinent History Pt's MRI results were available 11/27/2020 with mild joint changes. Pt reported abnormal EMG studies that was conducted 2-3 weeks ago. Pt is retired from an [employer] job that required traveling. Hobbies include yards work and maintaining his home. Pt is R hand dominate. Pt stated moderate aggravation with current information that was made available to him during his current treatment. Pt requested transparency with all test and measures as well as prognosis and progression.    Limitations House hold activities    How  long can you sit comfortably? Deferred to later tx    How long can you stand comfortably? Deferred to later tx    How long can you walk comfortably? Deferred to later tx    Diagnostic tests MRI and EMG    Patient Stated Goals " Get better"    Currently in Pain? Yes    Pain Score 3     Pain Location Shoulder    Pain Orientation Left    Pain Descriptors / Indicators Aching    Pain Type Acute pain    Pain Onset More than a month ago                                       PT Education - 02/04/21 1525     Education Details Pt was educated on the roll of scapular mobility to support optimal shoulder movement     Person(s) Educated Patient    Methods Explanation;Demonstration    Comprehension Verbalized understanding;Need further instruction                 PT Long Term Goals - 01/28/21 1724       PT LONG TERM GOAL #1   Title Pt will improve FOTO score to predicted value of 59 to improve independence with ADLs    Baseline 25    Time 8    Period Weeks    Status Not Met    Target Date 02/19/21      PT LONG TERM GOAL #2   Title Pt will improve L shoulder strengt tested via MMT Flex/abd to 2/5 to promote increase LUE use during grooming and feeding.    Baseline 0/5 08/10: MMT R Shoulder   Flexion: 2-/5  Abduction: 2-/5  in supine    Time 8    Period Weeks    Status Partially Met    Target Date 02/19/21      PT LONG TERM GOAL #3   Title Pt will improve L PROM equal to R to maintain glenohumeral joint health for future functional strength gains.    Baseline L flex105  Abd 68  R flexion/abd 110; 08/10 L shoulder: AROM L shoulder: deg.  Sitting  Flex: 0  Abd: 0  Supine:  Flex: 29  Abd: 20  ER: 10     PROM L shoulder: deg.  Supine:  Flex: 109  Abd: 101  ER: 26    Time 8    Period Weeks    Status Partially Met    Target Date 02/19/21      PT LONG TERM GOAL #4   Title Pt reports ability to don/doff pants and shirts MI without spouse assist to improve dressing ADL's.    Baseline Spouse current Mod-Max assist during dressing.    Time 8    Period Weeks    Status On-going    Target Date 02/19/21                   Plan - 02/04/21 1526     Personal Factors and Comorbidities Age;Comorbidity 3+;Time since onset of injury/illness/exacerbation    Examination-Activity Limitations Bed Mobility;Carry;Lift;Dressing;Reach Overhead;Self Feeding;Transfers    Examination-Participation Restrictions Yard Work    Stability/Clinical Decision Making Unstable/Unpredictable    Clinical Decision Making High    Rehab Potential Fair    PT Frequency 2x / week    PT Duration 8 weeks  PT  Treatment/Interventions ADLs/Self Care Home Management;Aquatic Therapy;Biofeedback;Cryotherapy;Electrical Stimulation;Moist Heat;Traction;Ultrasound;Fluidtherapy;Contrast Bath;Gait training;Stair training;Functional mobility training;Therapeutic activities;Therapeutic exercise;Balance training;Neuromuscular re-education;Patient/family education;Manual techniques;Passive range of motion;Dry needling;Energy conservation;Splinting;Taping;Joint Manipulations    PT Next Visit Plan Reassess soreness within 48 hrs of tx for proper tissue loading.  Check on Ortho MD referral.  10th visit progress noted next tx. session.    PT Home Exercise Plan Access Code: [email or web address]    Consulted and Agree with Plan of Care Patient             Patient will benefit from skilled therapeutic intervention in order to improve the following deficits and impairments:  Decreased activity tolerance, Decreased coordination, Decreased strength, Impaired UE functional use, Pain, Postural dysfunction, Improper body mechanics, Decreased range of motion, Decreased endurance, Decreased mobility, Decreased skin integrity, Hypomobility, Impaired sensation  Visit Diagnosis: Shoulder weakness  Abnormal posture  Pain of left upper extremity     Problem List Patient Active Problem List   Diagnosis Date Noted   Shingles 10/12/2020   Shoulder pain, left 10/12/2020   AKI (acute kidney injury) ([place]) 10/20/2019   Dilatation of thoracic aorta ([place]) 10/19/2019   Pedal edema 07/11/2019   Daytime somnolence 06/02/2019   Coronary artery disease involving native coronary artery of native heart without angina pectoris 02/08/2019   Chronic HFrEF (heart failure with reduced ejection fraction) ([place]) 02/08/2019   S/P aortic valve replacement 02/08/2019   Postoperative anemia 02/08/2019   S/P CABG x 3 86/77/3736   Acute systolic congestive heart failure ([place]) 12/21/2018   Biventricular failure ([place]) 12/21/2018   Nonrheumatic aortic valve  stenosis 12/21/2018   SOB (shortness of breath) 12/11/2018   Hypoxia 12/11/2018   Shortness of breath 12/11/2018   Right shoulder pain 05/08/2015   Health care maintenance 11/12/2014   BMI 32.0-32.9,adult 06/18/2014   Elevated PSA 02/17/2014   CKD (chronic kidney disease) stage 3, GFR 30-59 ml/min (HCC) 05/21/2012   Gout 05/21/2012   Hypertension 05/02/2012   Hyperlipidemia LDL goal <70 05/02/2012   Diabetes ([place]) 05/02/2012    Fara Olden 02/04/2021, 3:27 PM  [place] [facility] [address] [place], [place], [address] Phone: [phone]   Fax:  [phone]  Name: [name] [name] MRN: [id number] Date of Birth: 06-Jun-1931

## 2021-02-05 ENCOUNTER — Encounter: Payer: Self-pay | Admitting: Physical Therapy

## 2021-02-09 ENCOUNTER — Encounter: Payer: Medicare HMO | Admitting: Physical Therapy

## 2021-02-09 ENCOUNTER — Encounter: Payer: Self-pay | Admitting: Physical Therapy

## 2021-02-09 ENCOUNTER — Other Ambulatory Visit: Payer: Self-pay

## 2021-02-09 ENCOUNTER — Ambulatory Visit: Payer: Medicare HMO | Admitting: Physical Therapy

## 2021-02-09 DIAGNOSIS — R293 Abnormal posture: Secondary | ICD-10-CM | POA: Diagnosis not present

## 2021-02-09 DIAGNOSIS — R29898 Other symptoms and signs involving the musculoskeletal system: Secondary | ICD-10-CM

## 2021-02-09 DIAGNOSIS — M79602 Pain in left arm: Secondary | ICD-10-CM | POA: Diagnosis not present

## 2021-02-09 NOTE — Therapy (Signed)
[facility] [address] [place], [place], [address] Phone: [phone]   Fax:  [phone]  Physical Therapy Treatment  Patient Details  Name: [name] [name] MRN: [id number] Date of Birth: Jun 21, 1931 Referring Provider (PT): Ray Church, MD   Encounter Date: 02/09/2021   PT End of Session - 02/09/21 1452     Visit Number 13    Number of Visits 16    Date for PT Re-Evaluation 02/19/21    Authorization - Visit Number 3    Authorization - Number of Visits 10    Progress Note Due on Visit 10    PT Start Time 5809    PT Stop Time 1459    PT Time Calculation (min) 37 min    Equipment Utilized During Treatment Other (comment)    Activity Tolerance Patient tolerated treatment well;Patient limited by pain    Behavior During Therapy [facility] for tasks assessed/performed             Past Medical History:  Diagnosis Date   Aortic stenosis    Arthritis    right shoulder   BPH (benign prostatic hypertrophy)    Coronary artery disease    Gout    Heart murmur    History of chicken pox    Hypercholesterolemia    Hypertension    Systolic heart failure ([place])    ischemic cardiomyopathy   Wears dentures    full upper and lower    Past Surgical History:  Procedure Laterality Date   AORTIC VALVE REPLACEMENT N/A 01/25/2019   Procedure: AORTIC VALVE REPLACEMENT (AVR) 73m Inspiris valve;  Surgeon: BGaye Pollack MD;  [facility]: [facility];  Service: Open Heart Surgery;  Laterality: N/A;   arm surgery     right arm fx s/p "plate insertion"   CARDIAC CATHETERIZATION     CATARACT EXTRACTION W/PHACO Right 11/17/2016   Procedure: CATARACT EXTRACTION PHACO AND INTRAOCULAR LENS PLACEMENT ([place]  Right;  Surgeon: BLeandrew Koyanagi MD;  [place]  Service: Ophthalmology;  Laterality: Right;   CORONARY ARTERY BYPASS GRAFT N/A 01/25/2019   LIMA-LAD and sequential SVG-rPDA-rPL   DENTAL SURGERY     all teeth  extracted   RIGHT HEART CATH AND CORONARY ANGIOGRAPHY N/A 01/02/2019   Procedure: RIGHT HEART CATH AND CORONARY ANGIOGRAPHY;  Surgeon: ENelva Bush MD;  [facility];  Service: Cardiovascular;  Laterality: N/A;   TEE WITHOUT CARDIOVERSION N/A 01/25/2019   Procedure: TRANSESOPHAGEAL ECHOCARDIOGRAM (TEE);  Surgeon: BGaye Pollack MD;  [place]  Service: Open Heart Surgery;  Laterality: N/A;    There were no vitals filed for this visit.   Subjective Assessment - 02/09/21 1428     Subjective Pt presents to session with intermittent shooting pain 7/10 into L arm since this morning.    Pertinent History Pt's MRI results were available 11/27/2020 with mild joint changes. Pt reported abnormal EMG studies that was conducted 2-3 weeks ago. Pt is retired from an [employer] that required traveling. Hobbies include yards work and maintaining his home. Pt is R hand dominate. Pt stated moderate aggravation with current information that was made available to him during his current treatment. Pt requested transparency with all test and measures as well as prognosis and progression.    How long can you sit comfortably? Deferred to later tx    How long can you stand comfortably? Deferred to later tx    How long can you walk  comfortably? Deferred to later tx    Diagnostic tests MRI and EMG    Patient Stated Goals " Get better"    Currently in Pain? Yes    Pain Score 6     Pain Location Shoulder    Pain Orientation Left    Pain Descriptors / Indicators Shooting   shooting down to arm and finger   Pain Onset More than a month ago             Treatment:   UE bike set up: seat 8; L 4; R 5, 53mn fwd, 419m bwd. STM    Pulleys: 4 minutes in flexion and abd in standing with verbal cueing to attempt isometric holds of the LUE at end range of elevation.  Reassessment of shoulder ROM  Manual tx.:  Discussed scapular elevation.  No manual tx.  Tx. Cut short per pt. Request  today.      PT Long Term Goals - 01/28/21 1724       PT LONG TERM GOAL #1   Title Pt will improve FOTO score to predicted value of 59 to improve independence with ADLs    Baseline 25    Time 8    Period Weeks    Status Not Met    Target Date 02/19/21      PT LONG TERM GOAL #2   Title Pt will improve L shoulder strengt tested via MMT Flex/abd to 2/5 to promote increase LUE use during grooming and feeding.    Baseline 0/5 08/10: MMT R Shoulder   Flexion: 2-/5  Abduction: 2-/5  in supine    Time 8    Period Weeks    Status Partially Met    Target Date 02/19/21      PT LONG TERM GOAL #3   Title Pt will improve L PROM equal to R to maintain glenohumeral joint health for future functional strength gains.    Baseline L flex105  Abd 68  R flexion/abd 110; 08/10 L shoulder: AROM L shoulder: deg.  Sitting  Flex: 0  Abd: 0  Supine:  Flex: 29  Abd: 20  ER: 10     PROM L shoulder: deg.  Supine:  Flex: 109  Abd: 101  ER: 26    Time 8    Period Weeks    Status Partially Met    Target Date 02/19/21      PT LONG TERM GOAL #4   Title Pt reports ability to don/doff pants and shirts MI without spouse assist to improve dressing ADL's.    Baseline Spouse current Mod-Max assist during dressing.    Time 8    Period Weeks    Status On-going    Target Date 02/19/21                   Plan - 02/09/21 1529     Clinical Impression Statement Pt presented to session reporting intermittent pain shooting down from L shoulder to L hand. STM to supra/infraspinatus prescribed during AAROM. Pt reported pain/tenderness at trigger point and educated on trigger point release technique. Pt responded to education with flat affect. Pt stated that he had enough of the session and preferred to not continue with manual treatment. Pt educated on self postural control with shoulder depression. Pt stated he was planning on returning to next treatment session.    Personal Factors and Comorbidities Age;Comorbidity  3+;Time since onset of injury/illness/exacerbation    Examination-Activity Limitations Bed Mobility;Carry;Lift;Dressing;Reach Overhead;Self Feeding;Transfers  Examination-Participation Restrictions Yard Work    Stability/Clinical Decision Making Unstable/Unpredictable    Surveyor, mining    Rehab Potential Fair    PT Frequency 2x / week    PT Duration 8 weeks    PT Treatment/Interventions ADLs/Self Care Home Management;Aquatic Therapy;Biofeedback;Cryotherapy;Electrical Stimulation;Moist Heat;Traction;Ultrasound;Fluidtherapy;Contrast Bath;Gait training;Stair training;Functional mobility training;Therapeutic activities;Therapeutic exercise;Balance training;Neuromuscular re-education;Patient/family education;Manual techniques;Passive range of motion;Dry needling;Energy conservation;Splinting;Taping;Joint Manipulations    PT Next Visit Plan Reassess sx response to manual intervention.    PT Home Exercise Plan Access Code: [email or web address]    Consulted and Agree with Plan of Care Patient             Patient will benefit from skilled therapeutic intervention in order to improve the following deficits and impairments:  Decreased activity tolerance, Decreased coordination, Decreased strength, Impaired UE functional use, Pain, Postural dysfunction, Improper body mechanics, Decreased range of motion, Decreased endurance, Decreased mobility, Decreased skin integrity, Hypomobility, Impaired sensation  Visit Diagnosis: Shoulder weakness  Abnormal posture  Pain of left upper extremity     Problem List Patient Active Problem List   Diagnosis Date Noted   Shingles 10/12/2020   Shoulder pain, left 10/12/2020   AKI (acute kidney injury) ([place]) 10/20/2019   Dilatation of thoracic aorta ([place]) 10/19/2019   Pedal edema 07/11/2019   Daytime somnolence 06/02/2019   Coronary artery disease involving native coronary artery of native heart without angina pectoris 02/08/2019   Chronic HFrEF (heart  failure with reduced ejection fraction) ([place]) 02/08/2019   S/P aortic valve replacement 02/08/2019   Postoperative anemia 02/08/2019   S/P CABG x 3 39/79/5369   Acute systolic congestive heart failure ([place]) 12/21/2018   Biventricular failure ([place]) 12/21/2018   Nonrheumatic aortic valve stenosis 12/21/2018   SOB (shortness of breath) 12/11/2018   Hypoxia 12/11/2018   Shortness of breath 12/11/2018   Right shoulder pain 05/08/2015   Health care maintenance 11/12/2014   BMI 32.0-32.9,adult 06/18/2014   Elevated PSA 02/17/2014   CKD (chronic kidney disease) stage 3, GFR 30-59 ml/min (HCC) 05/21/2012   Gout 05/21/2012   Hypertension 05/02/2012   Hyperlipidemia LDL goal <70 05/02/2012   Diabetes ([place]) 05/02/2012   Pura Spice, PT, DPT # [id number] Shirley Friar, SPT 02/09/2021, 3:49 PM  [facility] [address] [place], [place], [address] Phone: [phone]   Fax:  [phone]  Name: [name] [name] MRN: [id number] Date of Birth: 1930-11-16

## 2021-02-11 ENCOUNTER — Other Ambulatory Visit: Payer: Self-pay

## 2021-02-11 ENCOUNTER — Encounter: Payer: Self-pay | Admitting: Physical Therapy

## 2021-02-11 ENCOUNTER — Encounter: Payer: Medicare HMO | Admitting: Physical Therapy

## 2021-02-11 ENCOUNTER — Ambulatory Visit: Payer: Medicare HMO | Admitting: Physical Therapy

## 2021-02-11 DIAGNOSIS — M79602 Pain in left arm: Secondary | ICD-10-CM

## 2021-02-11 DIAGNOSIS — R29898 Other symptoms and signs involving the musculoskeletal system: Secondary | ICD-10-CM | POA: Diagnosis not present

## 2021-02-11 DIAGNOSIS — R293 Abnormal posture: Secondary | ICD-10-CM | POA: Diagnosis not present

## 2021-02-12 NOTE — Therapy (Signed)
[facility] [address] [place], [place], [address] Phone: [phone]   Fax:  [phone]  Physical Therapy Treatment  Patient Details  Name: [name] [name] MRN: [id number] Date of Birth: [birth year] Referring Provider (PT): Ray Church, MD   Encounter Date: 02/11/2021   PT End of Session - 02/12/21 0716     Visit Number 14    Number of Visits 16    Date for PT Re-Evaluation 02/19/21    Authorization - Visit Number 4    Authorization - Number of Visits 10    Progress Note Due on Visit 10    PT Start Time 9450    PT Stop Time 1516    PT Time Calculation (min) 48 min    Equipment Utilized During Treatment Other (comment)    Activity Tolerance Patient tolerated treatment well;Patient limited by pain    Behavior During Therapy [facility] for tasks assessed/performed             Past Medical History:  Diagnosis Date   Aortic stenosis    Arthritis    right shoulder   BPH (benign prostatic hypertrophy)    Coronary artery disease    Gout    Heart murmur    History of chicken pox    Hypercholesterolemia    Hypertension    Systolic heart failure ([place])    ischemic cardiomyopathy   Wears dentures    full upper and lower    Past Surgical History:  Procedure Laterality Date   AORTIC VALVE REPLACEMENT N/A 01/25/2019   Procedure: AORTIC VALVE REPLACEMENT (AVR) 35mm Inspiris valve;  Surgeon: Gaye Pollack, MD;  [facility];  Service: Open Heart Surgery;  Laterality: N/A;   arm surgery     right arm fx s/p "plate insertion"   CARDIAC CATHETERIZATION     CATARACT EXTRACTION W/PHACO Right 11/17/2016   Procedure: CATARACT EXTRACTION PHACO AND INTRAOCULAR LENS PLACEMENT ([place])  Right;  Surgeon: Leandrew Koyanagi, MD;  Location: [place];  Service: Ophthalmology;  Laterality: Right;   CORONARY ARTERY BYPASS GRAFT N/A 01/25/2019   LIMA-LAD and sequential SVG-rPDA-rPL   DENTAL SURGERY     all teeth  extracted   RIGHT HEART CATH AND CORONARY ANGIOGRAPHY N/A 01/02/2019   Procedure: RIGHT HEART CATH AND CORONARY ANGIOGRAPHY;  Surgeon: Nelva Bush, MD;  Location: [place] [facility];  Service: Cardiovascular;  Laterality: N/A;   TEE WITHOUT CARDIOVERSION N/A 01/25/2019   Procedure: TRANSESOPHAGEAL ECHOCARDIOGRAM (TEE);  Surgeon: Gaye Pollack, MD;  Location: [place];  Service: Open Heart Surgery;  Laterality: N/A;    There were no vitals filed for this visit.   Subjective Assessment - 02/12/21 0717     Subjective Pt stated that he as 4/10 soreness in the L shoulder and lateral arm. Pt apologize about the short session last tx and stated that he was having a mentally tough day. Pt states that he is more mentally and physically prepared at today's tx. Pt stated he would like to return to manual treatment for the shoulder.    Pertinent History Pt's MRI results were available 11/27/2020 with mild joint changes. Pt reported abnormal EMG studies that was conducted 2-3 weeks ago. Pt is retired from an [employer] job that required traveling. Hobbies include yards work and maintaining his home. Pt is R hand dominate. Pt stated moderate aggravation with current information that was made available to him during his current treatment. Pt requested transparency with all  test and measures as well as prognosis and progression.    How long can you sit comfortably? Deferred to later tx    How long can you stand comfortably? Deferred to later tx    How long can you walk comfortably? Deferred to later tx    Diagnostic tests MRI and EMG    Patient Stated Goals " Get better"    Currently in Pain? Yes    Pain Score 4     Pain Location Shoulder    Pain Orientation Left    Pain Descriptors / Indicators Aching    Pain Type Neuropathic pain    Pain Onset More than a month ago                Treatment:   Therapeutic Exercise:     UE bike set up: seat 8; L 4; R 5, 80min fwd, 75min bwd.    Shoulder scaption  AAROM through a full range with isometric holds. Pt was able to produce 70% of the strength need to hold the arm at 90 deg of elevation. Pt displayed greater ROM and muscle activation post manual intervention       Manual Therapy: 34 mins     Position 1/4 side-lying with pillow support due to inability to lay fully on R side. 1) Grade 2-3 scapular depression with occ sustained holds at end range  2) STM to L infraspinatus, teres minor, upper trap, and levator scapulae.  -alternating bouts between 1 and 2.         PT Education - 02/11/21 1431     Education Details Pt was educated on no longer focusing on E-stim as a main part of tx to facilitate shoulder activiation.    Person(s) Educated Patient    Methods Explanation    Comprehension Verbalized understanding                 PT Long Term Goals - 01/28/21 1724       PT LONG TERM GOAL #1   Title Pt will improve FOTO score to predicted value of 59 to improve independence with ADLs    Baseline 25    Time 8    Period Weeks    Status Not Met    Target Date 02/19/21      PT LONG TERM GOAL #2   Title Pt will improve L shoulder strengt tested via MMT Flex/abd to 2/5 to promote increase LUE use during grooming and feeding.    Baseline 0/5 08/10: MMT R Shoulder   Flexion: 2-/5  Abduction: 2-/5  in supine    Time 8    Period Weeks    Status Partially Met    Target Date 02/19/21      PT LONG TERM GOAL #3   Title Pt will improve L PROM equal to R to maintain glenohumeral joint health for future functional strength gains.    Baseline L flex105  Abd 68  R flexion/abd 110; 08/10 L shoulder: AROM L shoulder: deg.  Sitting  Flex: 0  Abd: 0  Supine:  Flex: 29  Abd: 20  ER: 10     PROM L shoulder: deg.  Supine:  Flex: 109  Abd: 101  ER: 26    Time 8    Period Weeks    Status Partially Met    Target Date 02/19/21      PT LONG TERM GOAL #4   Title Pt reports ability to don/doff pants and shirts MI  without spouse assist to  improve dressing ADL's.    Baseline Spouse current Mod-Max assist during dressing.    Time 8    Period Weeks    Status On-going    Target Date 02/19/21                   Plan - 02/12/21 0739     Clinical Impression Statement Today's tx was focused on manual STM and scapular mobilization to promote proper muscle activation of the L shoulder complex. Pt displayed level bilat scapular elevation, 15 deg of active shoulder abd, and decrease in pain to 0/10. Pt continues to display mark UE endurance, strength, and ROM deficits resulting in decreased safe home/community mobiity, increased pain, and limited access to QOL. Pt. will continue to benefit from skilled physical therapy to progress POC to address remaining deficits to facilitate maximum functional capacity for optimal personal health and wellness for ADLs.    Personal Factors and Comorbidities Age;Comorbidity 3+;Time since onset of injury/illness/exacerbation    Examination-Activity Limitations Bed Mobility;Carry;Lift;Dressing;Reach Overhead;Self Feeding;Transfers    Examination-Participation Restrictions Yard Work    Stability/Clinical Decision Making Unstable/Unpredictable    Clinical Decision Making High    Rehab Potential Fair    PT Frequency 2x / week    PT Duration 8 weeks    PT Treatment/Interventions ADLs/Self Care Home Management;Aquatic Therapy;Biofeedback;Cryotherapy;Electrical Stimulation;Moist Heat;Traction;Ultrasound;Fluidtherapy;Contrast Bath;Gait training;Stair training;Functional mobility training;Therapeutic activities;Therapeutic exercise;Balance training;Neuromuscular re-education;Patient/family education;Manual techniques;Passive range of motion;Dry needling;Energy conservation;Splinting;Taping;Joint Manipulations    PT Next Visit Plan Reassess sx response to manual intervention.    PT Home Exercise Plan Access Code: [email or web address]    Consulted and Agree with Plan of Care Patient             Patient will  benefit from skilled therapeutic intervention in order to improve the following deficits and impairments:  Decreased activity tolerance, Decreased coordination, Decreased strength, Impaired UE functional use, Pain, Postural dysfunction, Improper body mechanics, Decreased range of motion, Decreased endurance, Decreased mobility, Decreased skin integrity, Hypomobility, Impaired sensation  Visit Diagnosis: Shoulder weakness  Abnormal posture  Pain of left upper extremity     Problem List Patient Active Problem List   Diagnosis Date Noted   Shingles 10/12/2020   Shoulder pain, left 10/12/2020   AKI (acute kidney injury) ([place]) 10/20/2019   Dilatation of thoracic aorta ([place]) 10/19/2019   Pedal edema 07/11/2019   Daytime somnolence 06/02/2019   Coronary artery disease involving native coronary artery of native heart without angina pectoris 02/08/2019   Chronic HFrEF (heart failure with reduced ejection fraction) ([place]) 02/08/2019   S/P aortic valve replacement 02/08/2019   Postoperative anemia 02/08/2019   S/P CABG x 3 29/79/8921   Acute systolic congestive heart failure ([place]) 12/21/2018   Biventricular failure ([place]) 12/21/2018   Nonrheumatic aortic valve stenosis 12/21/2018   SOB (shortness of breath) 12/11/2018   Hypoxia 12/11/2018   Shortness of breath 12/11/2018   Right shoulder pain 05/08/2015   Health care maintenance 11/12/2014   BMI 32.0-32.9,adult 06/18/2014   Elevated PSA 02/17/2014   CKD (chronic kidney disease) stage 3, GFR 30-59 ml/min (HCC) 05/21/2012   Gout 05/21/2012   Hypertension 05/02/2012   Hyperlipidemia LDL goal <70 05/02/2012   Diabetes ([place]) 05/02/2012   Pura Spice, PT, DPT # [id number] Fara Olden SPT 02/12/2021, 7:41 AM  [facility] [address]. [place], [place], [address] Phone: [phone]   Fax:  [phone]  Name: ROCK SOBOL MRN: [id number] Date of  Birth: [birth year]

## 2021-02-16 ENCOUNTER — Ambulatory Visit: Payer: Medicare HMO | Admitting: Physical Therapy

## 2021-02-16 ENCOUNTER — Encounter: Payer: Self-pay | Admitting: Physical Therapy

## 2021-02-16 ENCOUNTER — Other Ambulatory Visit: Payer: Self-pay

## 2021-02-16 DIAGNOSIS — M79602 Pain in left arm: Secondary | ICD-10-CM | POA: Diagnosis not present

## 2021-02-16 DIAGNOSIS — R29898 Other symptoms and signs involving the musculoskeletal system: Secondary | ICD-10-CM

## 2021-02-16 DIAGNOSIS — R293 Abnormal posture: Secondary | ICD-10-CM | POA: Diagnosis not present

## 2021-02-16 NOTE — Therapy (Signed)
[facility] [address] [place], [place], [address] Phone: [phone]   Fax:  [phone]  Physical Therapy Treatment  Patient Details  Name: [name] [name] MRN: [id number] Date of Birth: 1930/10/23 Referring Provider (PT): Ray Church, MD   Encounter Date: 02/16/2021   PT End of Session - 02/16/21 6387     Visit Number 15    Number of Visits 16    Date for PT Re-Evaluation 02/19/21    Authorization - Visit Number 5    Authorization - Number of Visits 10    Progress Note Due on Visit 10    PT Start Time 5643    PT Stop Time 1517    PT Time Calculation (min) 46 min    Equipment Utilized During Treatment Other (comment)    Activity Tolerance Patient tolerated treatment well;Patient limited by pain    Behavior During Therapy [facility] for tasks assessed/performed             Past Medical History:  Diagnosis Date   Aortic stenosis    Arthritis    right shoulder   BPH (benign prostatic hypertrophy)    Coronary artery disease    Gout    Heart murmur    History of chicken pox    Hypercholesterolemia    Hypertension    Systolic heart failure ([place])    ischemic cardiomyopathy   Wears dentures    full upper and lower    Past Surgical History:  Procedure Laterality Date   AORTIC VALVE REPLACEMENT N/A 01/25/2019   Procedure: AORTIC VALVE REPLACEMENT (AVR) 53m Inspiris valve;  Surgeon: BGaye Pollack MD;  [facility];  Service: Open Heart Surgery;  Laterality: N/A;   arm surgery     right arm fx s/p "plate insertion"   CARDIAC CATHETERIZATION     CATARACT EXTRACTION W/PHACO Right 11/17/2016   Procedure: CATARACT EXTRACTION PHACO AND INTRAOCULAR LENS PLACEMENT ([place]  Right;  Surgeon: BLeandrew Koyanagi MD;  [place]  Service: Ophthalmology;  Laterality: Right;   CORONARY ARTERY BYPASS GRAFT N/A 01/25/2019   LIMA-LAD and sequential SVG-rPDA-rPL   DENTAL SURGERY     all teeth  extracted   RIGHT HEART CATH AND CORONARY ANGIOGRAPHY N/A 01/02/2019   Procedure: RIGHT HEART CATH AND CORONARY ANGIOGRAPHY;  Surgeon: ENelva Bush MD;  [facility];  Service: Cardiovascular;  Laterality: N/A;   TEE WITHOUT CARDIOVERSION N/A 01/25/2019   Procedure: TRANSESOPHAGEAL ECHOCARDIOGRAM (TEE);  Surgeon: BGaye Pollack MD;  [place]  Service: Open Heart Surgery;  Laterality: N/A;    There were no vitals filed for this visit.   Subjective Assessment - 02/16/21 1537     Subjective Pt present to tx with 4/10 pain in the L shoulder. Pt states that he continues to get "zinger" like pain with rapid onset/offset.  Pt's "zinger" only happen during resting posture and never during activity. Pt states a follow up with Dr. SManuella Ghazion Wedneday 8/31.    Pertinent History Pt's MRI results were available 11/27/2020 with mild joint changes. Pt reported abnormal EMG studies that was conducted 2-3 weeks ago. Pt is retired from an [employer] that required traveling. Hobbies include yards work and maintaining his home. Pt is R hand dominate. Pt stated moderate aggravation with current information that was made available to him during his current treatment. Pt requested transparency with all test and measures as well as prognosis and progression.  How long can you sit comfortably? Deferred to later tx    How long can you stand comfortably? Deferred to later tx    How long can you walk comfortably? Deferred to later tx    Diagnostic tests MRI and EMG    Patient Stated Goals " Get better"    Currently in Pain? Yes    Pain Score 4     Pain Location Shoulder    Pain Orientation Left    Pain Descriptors / Indicators Aching    Pain Type Neuropathic pain    Pain Onset More than a month ago              Treatment:   Therapeutic Exercise:     UE bike set up: seat 8; L 4; R 5, 64mn fwd, 328m bwd.    Shoulder scaption AAROM through a full range with isometric holds. Pt  was able to produce 10% of the strength need to hold the arm at 60 deg of elevation. Pt displayed greater ROM and muscle activation post manual intervention. 5 sec holdsx2x10     Manual Therapy: 33 mins     Position 1/4 side-lying with pillow support due to inability to lay fully on R side. 1) Grade 2-3 scapular depression with occ sustained holds at end range  2) STM at neutral and 90 deg of abd to L infraspinatus, teres minor, pectoralis minor/major upper trap, and levator scapulae.  -alternating bouts between 1 and 2.   ROM Shoulder abd 10 deg pre manual intervention 15 deg post manual intervention with decrease in L shoulder elevation.              PT Education - 02/16/21 1540     Education Details Pt was educated on the roll of rotator cuff muscles in facilitating superior support of [place].    Person(s) Educated Patient    Methods Explanation;Tactile cues;Demonstration;Verbal cues    Comprehension Verbalized understanding;Need further instruction                 PT Long Term Goals - 01/28/21 1724       PT LONG TERM GOAL #1   Title Pt will improve FOTO score to predicted value of 59 to improve independence with ADLs    Baseline 25    Time 8    Period Weeks    Status Not Met    Target Date 02/19/21      PT LONG TERM GOAL #2   Title Pt will improve L shoulder strengt tested via MMT Flex/abd to 2/5 to promote increase LUE use during grooming and feeding.    Baseline 0/5 08/10: MMT R Shoulder   Flexion: 2-/5  Abduction: 2-/5  in supine    Time 8    Period Weeks    Status Partially Met    Target Date 02/19/21      PT LONG TERM GOAL #3   Title Pt will improve L PROM equal to R to maintain glenohumeral joint health for future functional strength gains.    Baseline L flex105  Abd 68  R flexion/abd 110; 08/10 L shoulder: AROM L shoulder: deg.  Sitting  Flex: 0  Abd: 0  Supine:  Flex: 29  Abd: 20  ER: 10     PROM L shoulder: deg.  Supine:  Flex: 109  Abd: 101   ER: 26    Time 8    Period Weeks    Status Partially Met  Target Date 02/19/21      PT LONG TERM GOAL #4   Title Pt reports ability to don/doff pants and shirts MI without spouse assist to improve dressing ADL's.    Baseline Spouse current Mod-Max assist during dressing.    Time 8    Period Weeks    Status On-going    Target Date 02/19/21                   Plan - 02/16/21 1541     Clinical Impression Statement Today's tx included addition of STM at 90 degs of abd to facilitate L shoulder ROM. Pt displayed increase in 5 deg of active shoulder abd post manual intervention. Pt relies heavily  on supraspinatus for shoulder abduction. Pt continues to display a L GH sulcus sign that decrease post manual intervention and isometric holds. Pt would benefit from a shoulder MRI to rule out muscular skeletal involvement. Pt continues to display mark UE endurance, strength, and ROM deficits resulting in decreased safe home/community ADL adherence, increased pain, and limited access to QOL. Pt. will continue to benefit from skilled physical therapy to progress POC to address remaining deficits to facilitate maximum functional capacity for optimal personal health and wellness for ADLs.    Personal Factors and Comorbidities Age;Comorbidity 3+;Time since onset of injury/illness/exacerbation    Examination-Activity Limitations Bed Mobility;Carry;Lift;Dressing;Reach Overhead;Self Feeding;Transfers    Examination-Participation Restrictions Yard Work    Stability/Clinical Decision Making Unstable/Unpredictable    Clinical Decision Making High    Rehab Potential Fair    PT Frequency 2x / week    PT Duration 8 weeks    PT Treatment/Interventions ADLs/Self Care Home Management;Aquatic Therapy;Biofeedback;Cryotherapy;Electrical Stimulation;Moist Heat;Traction;Ultrasound;Fluidtherapy;Contrast Bath;Gait training;Stair training;Functional mobility training;Therapeutic activities;Therapeutic  exercise;Balance training;Neuromuscular re-education;Patient/family education;Manual techniques;Passive range of motion;Dry needling;Energy conservation;Splinting;Taping;Joint Manipulations    PT Next Visit Plan Discuss MD appt.    PT Home Exercise Plan Access Code: [email or web address]    Consulted and Agree with Plan of Care Patient             Patient will benefit from skilled therapeutic intervention in order to improve the following deficits and impairments:  Decreased activity tolerance, Decreased coordination, Decreased strength, Impaired UE functional use, Pain, Postural dysfunction, Improper body mechanics, Decreased range of motion, Decreased endurance, Decreased mobility, Decreased skin integrity, Hypomobility, Impaired sensation  Visit Diagnosis: Shoulder weakness  Pain of left upper extremity  Abnormal posture     Problem List Patient Active Problem List   Diagnosis Date Noted   Shingles 10/12/2020   Shoulder pain, left 10/12/2020   AKI (acute kidney injury) ([place]) 10/20/2019   Dilatation of thoracic aorta ([place]) 10/19/2019   Pedal edema 07/11/2019   Daytime somnolence 06/02/2019   Coronary artery disease involving native coronary artery of native heart without angina pectoris 02/08/2019   Chronic HFrEF (heart failure with reduced ejection fraction) ([place]) 02/08/2019   S/P aortic valve replacement 02/08/2019   Postoperative anemia 02/08/2019   S/P CABG x 3 86/76/1950   Acute systolic congestive heart failure ([place]) 12/21/2018   Biventricular failure ([place]) 12/21/2018   Nonrheumatic aortic valve stenosis 12/21/2018   SOB (shortness of breath) 12/11/2018   Hypoxia 12/11/2018   Shortness of breath 12/11/2018   Right shoulder pain 05/08/2015   Health care maintenance 11/12/2014   BMI 32.0-32.9,adult 06/18/2014   Elevated PSA 02/17/2014   CKD (chronic kidney disease) stage 3, GFR 30-59 ml/min (HCC) 05/21/2012   Gout 05/21/2012   Hypertension 05/02/2012   Hyperlipidemia LDL  goal <70  05/02/2012   Diabetes ([place]) 05/02/2012   Pura Spice, PT, DPT # [id number] Fara Olden SPT 02/16/2021, 6:36 PM  [place] [facility] [address] [place], [place], [address] Phone: [phone]   Fax:  [phone]  Name: TYRICE HEWITT MRN: [id number] Date of Birth: 05/12/1931

## 2021-02-18 ENCOUNTER — Other Ambulatory Visit: Payer: Self-pay

## 2021-02-18 ENCOUNTER — Encounter: Payer: Self-pay | Admitting: Physical Therapy

## 2021-02-18 ENCOUNTER — Ambulatory Visit: Payer: Medicare HMO | Admitting: Physical Therapy

## 2021-02-18 DIAGNOSIS — R293 Abnormal posture: Secondary | ICD-10-CM

## 2021-02-18 DIAGNOSIS — R29898 Other symptoms and signs involving the musculoskeletal system: Secondary | ICD-10-CM | POA: Diagnosis not present

## 2021-02-18 DIAGNOSIS — M5412 Radiculopathy, cervical region: Secondary | ICD-10-CM | POA: Diagnosis not present

## 2021-02-18 DIAGNOSIS — M79602 Pain in left arm: Secondary | ICD-10-CM | POA: Diagnosis not present

## 2021-02-18 NOTE — Therapy (Signed)
[place] [facility] [address] [place], [place], [address] Phone: [phone]   Fax:  [phone]  Physical Therapy Treatment  Patient Details  Name: [name] [name] MRN: [id number] Date of Birth: 1931-04-16 Referring Provider (PT): Jolene Provost, MD   Encounter Date: 02/18/2021   PT End of Session - 02/18/21 1431     Visit Number 16    Number of Visits 16    Date for PT Re-Evaluation 02/19/21    Authorization - Visit Number 5    Authorization - Number of Visits 6    Progress Note Due on Visit 10    PT Start Time 1431    PT Stop Time 1516    PT Time Calculation (min) 45 min    Equipment Utilized During Treatment Other (comment)    Activity Tolerance Patient tolerated treatment well;Patient limited by pain    Behavior During Therapy [facility] for tasks assessed/performed             Past Medical History:  Diagnosis Date   Aortic stenosis    Arthritis    right shoulder   BPH (benign prostatic hypertrophy)    Coronary artery disease    Gout    Heart murmur    History of chicken pox    Hypercholesterolemia    Hypertension    Systolic heart failure (HCC)    ischemic cardiomyopathy   Wears dentures    full upper and lower    Past Surgical History:  Procedure Laterality Date   AORTIC VALVE REPLACEMENT N/A 01/25/2019   Procedure: AORTIC VALVE REPLACEMENT (AVR) 41mm Inspiris valve;  Surgeon: Alleen Borne, MD;  [facility];  Service: Open Heart Surgery;  Laterality: N/A;   arm surgery     right arm fx s/p "plate insertion"   CARDIAC CATHETERIZATION     CATARACT EXTRACTION W/PHACO Right 11/17/2016   Procedure: CATARACT EXTRACTION PHACO AND INTRAOCULAR LENS PLACEMENT ([facility])  Right;  Surgeon: Lockie Mola, MD;  [facility];  Service: Ophthalmology;  Laterality: Right;   CORONARY ARTERY BYPASS GRAFT N/A 01/25/2019   LIMA-LAD and sequential SVG-rPDA-rPL   DENTAL SURGERY     all teeth  extracted   RIGHT HEART CATH AND CORONARY ANGIOGRAPHY N/A 01/02/2019   Procedure: RIGHT HEART CATH AND CORONARY ANGIOGRAPHY;  Surgeon: Yvonne Kendall, MD;  Location: [facility];  Service: Cardiovascular;  Laterality: N/A;   TEE WITHOUT CARDIOVERSION N/A 01/25/2019   Procedure: TRANSESOPHAGEAL ECHOCARDIOGRAM (TEE);  Surgeon: Alleen Borne, MD;  Location: [facility] OR;  Service: Open Heart Surgery;  Laterality: N/A;    There were no vitals filed for this visit.   Subjective Assessment - 02/18/21 1438     Subjective Pt present to tx with 4/10 pain in the L shoulder. Pt states that he continues to get "zinger" like pain with rapid onset/offset.  Pt's "zinger" only happen during resting posture and never during activity. Pt states a follow up with Dr. Sherryll Burger on Wedneday 8/31.    Pertinent History Pt's MRI results were available 11/27/2020 with mild joint changes. Pt reported abnormal EMG studies that was conducted 2-3 weeks ago. Pt is retired from an [employer] job that required traveling. Hobbies include yards work and maintaining his home. Pt is R hand dominate. Pt stated moderate aggravation with current information that was made available to him during his current treatment. Pt requested transparency with all test and measures as well as prognosis and progression.  How long can you sit comfortably? Deferred to later tx    How long can you stand comfortably? Deferred to later tx    How long can you walk comfortably? Deferred to later tx    Diagnostic tests MRI and EMG    Patient Stated Goals " Get better"    Pain Onset More than a month ago            Treatment:   Therapeutic Exercise:     UE bike set up: seat 8; L 4; R 5, 57min fwd, 73min bwd.    Shoulder flexion and abd AAROM with isometric holds. Pt was able to produce 80% of the strength needed to hold the arm at 60 deg of elevation. Pt displayed greater ROM and muscle activation post manual intervention. 5 sec holdsx2x10. Pt displayed  mark fatigue in the shoulder post therex with reduced ability to hold isometric holds.      Manual Therapy: 28 mins     Position 1/4 side-lying with pillow support due to inability to lay fully on R side. 1) Grade 2-3 scapular depression with occ sustained holds at end range  2) STM at neutral and 90 deg of abd to L infraspinatus, teres minor, pectoralis minor/major upper trap, and levator scapulae.  -alternating bouts between 1 and 2.       PT Long Term Goals - 01/28/21 1724       PT LONG TERM GOAL #1   Title Pt will improve FOTO score to predicted value of 59 to improve independence with ADLs    Baseline 25    Time 8    Period Weeks    Status Not Met    Target Date 02/19/21      PT LONG TERM GOAL #2   Title Pt will improve L shoulder strengt tested via MMT Flex/abd to 2/5 to promote increase LUE use during grooming and feeding.    Baseline 0/5 08/10: MMT R Shoulder   Flexion: 2-/5  Abduction: 2-/5  in supine    Time 8    Period Weeks    Status Partially Met    Target Date 02/19/21      PT LONG TERM GOAL #3   Title Pt will improve L PROM equal to R to maintain glenohumeral joint health for future functional strength gains.    Baseline L flex105  Abd 68  R flexion/abd 110; 08/10 L shoulder: AROM L shoulder: deg.  Sitting  Flex: 0  Abd: 0  Supine:  Flex: 29  Abd: 20  ER: 10     PROM L shoulder: deg.  Supine:  Flex: 109  Abd: 101  ER: 26    Time 8    Period Weeks    Status Partially Met    Target Date 02/19/21      PT LONG TERM GOAL #4   Title Pt reports ability to don/doff pants and shirts MI without spouse assist to improve dressing ADL's.    Baseline Spouse current Mod-Max assist during dressing.    Time 8    Period Weeks    Status On-going    Target Date 02/19/21                   Plan - 02/18/21 1519     Clinical Impression Statement Pt tolerated tx well with reduction in stiffness and decrease in scapular elevation post manual intervention. Pt  displays good tissue mobility in the posterior shoulder with increased stiffness in  the anterior shoulder, specifically pect major/minor. Pt continues to display mark UE endurance, strength, and ROM deficits resulting in decreased safe home/community mobiity, increased pain, and limited access to QOL. Pt would benefit form a updated HEP to promote optimal tissue mobility. Pt. will continue to benefit from skilled physical therapy to progress POC to address remaining deficits to facilitate maximum functional capacity for optimal personal health and wellness for ADLs.    Personal Factors and Comorbidities Age;Comorbidity 3+;Time since onset of injury/illness/exacerbation    Examination-Activity Limitations Bed Mobility;Carry;Lift;Dressing;Reach Overhead;Self Feeding;Transfers    Examination-Participation Restrictions Yard Work    Stability/Clinical Decision Making Unstable/Unpredictable    Clinical Decision Making High    Rehab Potential Fair    PT Frequency 2x / week    PT Duration 8 weeks    PT Treatment/Interventions ADLs/Self Care Home Management;Aquatic Therapy;Biofeedback;Cryotherapy;Electrical Stimulation;Moist Heat;Traction;Ultrasound;Fluidtherapy;Contrast Bath;Gait training;Stair training;Functional mobility training;Therapeutic activities;Therapeutic exercise;Balance training;Neuromuscular re-education;Patient/family education;Manual techniques;Passive range of motion;Dry needling;Energy conservation;Splinting;Taping;Joint Manipulations    PT Next Visit Plan Discuss MD appt.    PT Home Exercise Plan Access Code: [email or web address]    Consulted and Agree with Plan of Care Patient             Patient will benefit from skilled therapeutic intervention in order to improve the following deficits and impairments:  Decreased activity tolerance, Decreased coordination, Decreased strength, Impaired UE functional use, Pain, Postural dysfunction, Improper body mechanics, Decreased range of motion, Decreased  endurance, Decreased mobility, Decreased skin integrity, Hypomobility, Impaired sensation  Visit Diagnosis: Shoulder weakness  Pain of left upper extremity  Abnormal posture     Problem List Patient Active Problem List   Diagnosis Date Noted   Shingles 10/12/2020   Shoulder pain, left 10/12/2020   AKI (acute kidney injury) ([place]) 10/20/2019   Dilatation of thoracic aorta ([place]) 10/19/2019   Pedal edema 07/11/2019   Daytime somnolence 06/02/2019   Coronary artery disease involving native coronary artery of native heart without angina pectoris 02/08/2019   Chronic HFrEF (heart failure with reduced ejection fraction) ([place]) 02/08/2019   S/P aortic valve replacement 02/08/2019   Postoperative anemia 02/08/2019   S/P CABG x 3 56/25/6389   Acute systolic congestive heart failure ([place]) 12/21/2018   Biventricular failure ([place]) 12/21/2018   Nonrheumatic aortic valve stenosis 12/21/2018   SOB (shortness of breath) 12/11/2018   Hypoxia 12/11/2018   Shortness of breath 12/11/2018   Right shoulder pain 05/08/2015   Health care maintenance 11/12/2014   BMI 32.0-32.9,adult 06/18/2014   Elevated PSA 02/17/2014   CKD (chronic kidney disease) stage 3, GFR 30-59 ml/min (HCC) 05/21/2012   Gout 05/21/2012   Hypertension 05/02/2012   Hyperlipidemia LDL goal <70 05/02/2012   Diabetes ([place]) 05/02/2012    Fara Olden SPT 02/18/2021, 3:26 PM  [place] [facility] [address] [name], [place], [address] Phone: [phone]   Fax:  [phone]  Name: [name] [name] MRN: [id number] Date of Birth: 01/21/1931

## 2021-02-24 ENCOUNTER — Telehealth: Payer: Medicare HMO | Admitting: Internal Medicine

## 2021-02-25 ENCOUNTER — Encounter: Payer: Medicare HMO | Admitting: Physical Therapy

## 2021-02-25 ENCOUNTER — Other Ambulatory Visit: Payer: Self-pay | Admitting: Internal Medicine

## 2021-03-02 ENCOUNTER — Encounter: Payer: Self-pay | Admitting: Physical Therapy

## 2021-03-02 ENCOUNTER — Encounter: Payer: Self-pay | Admitting: Emergency Medicine

## 2021-03-02 ENCOUNTER — Other Ambulatory Visit: Payer: Self-pay

## 2021-03-02 ENCOUNTER — Ambulatory Visit
Admission: EM | Admit: 2021-03-02 | Discharge: 2021-03-02 | Disposition: A | Discharge status: Home / Self Care | Payer: Medicare HMO | Attending: Internal Medicine | Admitting: Internal Medicine

## 2021-03-02 ENCOUNTER — Telehealth: Payer: Self-pay | Admitting: Internal Medicine

## 2021-03-02 ENCOUNTER — Ambulatory Visit: Payer: Medicare HMO | Attending: Neurology | Admitting: Physical Therapy

## 2021-03-02 DIAGNOSIS — R293 Abnormal posture: Secondary | ICD-10-CM | POA: Diagnosis not present

## 2021-03-02 DIAGNOSIS — S61431A Puncture wound without foreign body of right hand, initial encounter: Secondary | ICD-10-CM | POA: Diagnosis not present

## 2021-03-02 DIAGNOSIS — L03113 Cellulitis of right upper limb: Secondary | ICD-10-CM | POA: Diagnosis not present

## 2021-03-02 DIAGNOSIS — M79602 Pain in left arm: Secondary | ICD-10-CM | POA: Diagnosis not present

## 2021-03-02 DIAGNOSIS — Z23 Encounter for immunization: Secondary | ICD-10-CM

## 2021-03-02 DIAGNOSIS — R29898 Other symptoms and signs involving the musculoskeletal system: Secondary | ICD-10-CM | POA: Diagnosis not present

## 2021-03-02 MED ORDER — DOXYCYCLINE HYCLATE 100 MG PO CAPS: 100.0000 mg | ORAL_CAPSULE | Freq: Two times a day (BID) | ORAL | 0 refills | Status: DC

## 2021-03-02 MED ORDER — TETANUS-DIPHTH-ACELL PERTUSSIS 5-2.5-18.5 LF-MCG/0.5 IM SUSY: 0.5000 mL | PREFILLED_SYRINGE | Freq: Once | INTRAMUSCULAR | Status: DC

## 2021-03-02 MED ORDER — CEFTRIAXONE SODIUM 1 G IJ SOLR
1.0000 g | Freq: Once | INTRAMUSCULAR | Status: AC
  Administered 2021-03-02: 1 g via INTRAMUSCULAR

## 2021-03-02 MED ORDER — TETANUS-DIPHTH-ACELL PERTUSSIS 5-2.5-18.5 LF-MCG/0.5 IM SUSY
0.5000 mL | PREFILLED_SYRINGE | Freq: Once | INTRAMUSCULAR | Status: AC
  Administered 2021-03-02: 0.5 mL via INTRAMUSCULAR

## 2021-03-02 NOTE — ED Triage Notes (Signed)
Pt presents today with redness/swelling and pain to right palm. He reports that he cut right hand with a piece of wire approx 1 week ago while gardening. Denies fever. Last Tetanus unknown.

## 2021-03-02 NOTE — ED Provider Notes (Signed)
[facility]    CSN: [id number] Arrival date & time: 03/02/21  1741      History   Chief Complaint Chief Complaint  Patient presents with   Puncture Wound    Rt hand    HPI [name] [name] is a 87 y.o. male who poked his R palm with a pointy metal and since this weekend this area is getting red and tender and noticed mild puss draining today. He washed it about one hour latera and thought things were fine. Denies possibility of FB in this area. Denies fever, chills or sweats . Denies hx of staph or MRSA infections.     Past Medical History:  Diagnosis Date   Aortic stenosis    Arthritis    right shoulder   BPH (benign prostatic hypertrophy)    Coronary artery disease    Gout    Heart murmur    History of chicken pox    Hypercholesterolemia    Hypertension    Systolic heart failure ([place])    ischemic cardiomyopathy   Wears dentures    full upper and lower    Patient Active Problem List   Diagnosis Date Noted   Shingles 10/12/2020   Shoulder pain, left 10/12/2020   AKI (acute kidney injury) ([place]) 10/20/2019   Dilatation of thoracic aorta ([place]) 10/19/2019   Pedal edema 07/11/2019   Daytime somnolence 06/02/2019   Coronary artery disease involving native coronary artery of native heart without angina pectoris 02/08/2019   Chronic HFrEF (heart failure with reduced ejection fraction) ([place]) 02/08/2019   S/P aortic valve replacement 02/08/2019   Postoperative anemia 02/08/2019   S/P CABG x 3 [id number]   Acute systolic congestive heart failure ([place]) 12/21/2018   Biventricular failure ([place]) 12/21/2018   Nonrheumatic aortic valve stenosis 12/21/2018   SOB (shortness of breath) 12/11/2018   Hypoxia 12/11/2018   Shortness of breath 12/11/2018   Right shoulder pain 05/08/2015   Health care maintenance 11/12/2014   BMI 32.0-32.9,adult 06/18/2014   Elevated PSA 02/17/2014   CKD (chronic kidney disease) stage 3, GFR 30-59 ml/min (HCC) 05/21/2012   Gout  05/21/2012   Hypertension 05/02/2012   Hyperlipidemia LDL goal <70 05/02/2012   Diabetes ([place]) 05/02/2012    Past Surgical History:  Procedure Laterality Date   AORTIC VALVE REPLACEMENT N/A 01/25/2019   Procedure: AORTIC VALVE REPLACEMENT (AVR) 45m Inspiris valve;  Surgeon: BGaye Pollack MD;  [facility];  Service: Open Heart Surgery;  Laterality: N/A;   arm surgery     right arm fx s/p "plate insertion"   CARDIAC CATHETERIZATION     CATARACT EXTRACTION W/PHACO Right 11/17/2016   Procedure: CATARACT EXTRACTION PHACO AND INTRAOCULAR LENS PLACEMENT ([place]  Right;  Surgeon: BLeandrew Koyanagi MD;  [place]  Service: Ophthalmology;  Laterality: Right;   CORONARY ARTERY BYPASS GRAFT N/A 01/25/2019   LIMA-LAD and sequential SVG-rPDA-rPL   DENTAL SURGERY     all teeth extracted   RIGHT HEART CATH AND CORONARY ANGIOGRAPHY N/A 01/02/2019   Procedure: RIGHT HEART CATH AND CORONARY ANGIOGRAPHY;  Surgeon: ENelva Bush MD;  [facility];  Service: Cardiovascular;  Laterality: N/A;   TEE WITHOUT CARDIOVERSION N/A 01/25/2019   Procedure: TRANSESOPHAGEAL ECHOCARDIOGRAM (TEE);  Surgeon: BGaye Pollack MD;  [facility]  Service: Open Heart Surgery;  Laterality: N/A;       Home Medications    Prior to Admission medications   Medication Sig Start Date End Date Taking? Authorizing  Provider  doxycycline (VIBRAMYCIN) 100 MG capsule Take 1 capsule (100 mg total) by mouth 2 (two) times daily. 03/02/21  Yes Rodriguez-Southworth, Sunday Spillers, PA-C  allopurinol (ZYLOPRIM) 100 MG tablet TAKE 1 TABLET EVERY DAY 02/25/21   Einar Pheasant, MD  amLODipine (NORVASC) 5 MG tablet TAKE 1 TABLET EVERY DAY 01/07/21   Einar Pheasant, MD  aspirin EC 81 MG tablet Take 1 tablet (81 mg total) by mouth daily. 07/13/19   End, Harrell Gave, MD  atorvastatin (LIPITOR) 80 MG tablet TAKE 1 TABLET EVERY MORNING 12/01/20   End, Harrell Gave, MD  carvedilol (COREG) 6.25 MG tablet TAKE 1  TABLET TWICE DAILY WITH A MEAL 12/19/20   Einar Pheasant, MD  cyanocobalamin (,VITAMIN B-12,) 1000 MCG/ML injection Inject 1,000 mcg into the muscle every 30 (thirty) days.    [provider]  ergocalciferol (VITAMIN D2) 1.25 MG (50000 UT) capsule Take 1 capsule by mouth once a week. 11/25/20   [provider]  gabapentin (NEURONTIN) 300 MG capsule Take 300 mg by mouth at bedtime. 11/12/20   [provider]  LORazepam (ATIVAN) 1 MG tablet Takes 1/2 to 1 tablet q day prn 01/08/21   Einar Pheasant, MD  losartan (COZAAR) 100 MG tablet TAKE 1 TABLET (100 MG TOTAL) BY MOUTH DAILY. 01/26/21 04/26/21  End, Harrell Gave, MD    Family History Family History  Problem Relation Age of Onset   Leukemia Father    Congestive Heart Failure Mother    Cancer Daughter        Breast Cancer   Prostate cancer Neg Hx    Colon cancer Neg Hx     Social History Social History   Tobacco Use   Smoking status: Never   Smokeless tobacco: Current    Types: Chew   Tobacco comments:    not ready to quit currently  Vaping Use   Vaping Use: Never used  Substance Use Topics   Alcohol use: No    Alcohol/week: 0.0 standard drinks   Drug use: No     Allergies   Patient has no known allergies.   Review of Systems Review of Systems  Constitutional:  Negative for chills, diaphoresis, fatigue and fever.  Skin:  Positive for color change and wound. Negative for pallor and rash.  Hematological:  Negative for adenopathy.    Physical Exam Triage Vital Signs ED Triage Vitals  Enc Vitals Group     BP 03/02/21 1852 (!) 176/102     Pulse Rate 03/02/21 1852 68     Resp 03/02/21 1852 18     Temp 03/02/21 1852 97.9 F (36.6 C)     Temp Source 03/02/21 1852 Oral     SpO2 03/02/21 1852 97 %     Weight --      Height --      Head Circumference --      Peak Flow --      Pain Score 03/02/21 1851 0     Pain Loc --      Pain Edu? --      Excl. in [place]? --    No data found.  Updated Vital  Signs BP (!) 176/102 (BP Location: Right Arm)   Pulse 68   Temp 97.9 F (36.6 C) (Oral)   Resp 18   SpO2 97%   Visual Acuity Right Eye Distance:   Left Eye Distance:   Bilateral Distance:    Right Eye Near:   Left Eye Near:    Bilateral Near:  Physical Exam Vitals and nursing note reviewed.  Constitutional:      General: He is not in acute distress.    Appearance: He is not toxic-appearing.  HENT:     Head: Normocephalic.     Right Ear: External ear normal.     Left Ear: External ear normal.  Eyes:     General: No scleral icterus.    Conjunctiva/sclera: Conjunctivae normal.  Cardiovascular:     Pulses: Normal pulses.  Pulmonary:     Effort: Pulmonary effort is normal.  Musculoskeletal:        General: Normal range of motion.     Cervical back: Neck supple.  Lymphadenopathy:     Upper Body:     Right upper body: No axillary adenopathy.  Skin:    General: Skin is warm and dry.     Capillary Refill: Capillary refill takes less than 2 seconds.     Findings: Erythema present.     Comments: R HAND- with puncture wound on medial proximal palm which is draining a little puss from this area, with erythema and warmth around it, and slightly pink streak going to the distal ventral forearm   Neurological:     Mental Status: He is alert and oriented to person, place, and time.     Gait: Gait normal.  Psychiatric:        Mood and Affect: Mood normal.        Behavior: Behavior normal.        Thought Content: Thought content normal.        Judgment: Judgment normal.     UC Treatments / Results  Labs (all labs ordered are listed, but only abnormal results are displayed) Labs Reviewed - No data to display  EKG   Radiology No results found.  Procedures Procedures (including critical care time)  Medications Ordered in UC Medications  Tdap (BOOSTRIX) injection 0.5 mL (0.5 mLs Intramuscular Given 03/02/21 1919)  cefTRIAXone (ROCEPHIN) injection 1 g (1 g  Intramuscular Given 03/02/21 1918)    Initial Impression / Assessment and Plan / UC Course  I have reviewed the triage vital signs and the nursing notes. Cellulitis of R hand. TDAP was give He was given Rocephin 1 G IM and I sent Doxycycline as noted. See instructions.    Final Clinical Impressions(s) / UC Diagnoses   Final diagnoses:  Cellulitis of hand, right     Discharge Instructions      If the redness advances up higher in your arm tomorrow, then you need to go to ER.  Start the oral antibiotic tonight      ED Prescriptions     Medication Sig Dispense Auth. Provider   doxycycline (VIBRAMYCIN) 100 MG capsule Take 1 capsule (100 mg total) by mouth 2 (two) times daily. 20 capsule Rodriguez-Southworth, Sunday Spillers, PA-C      PDMP not reviewed this encounter.   Shelby Mattocks, Hershal Coria 03/02/21 1943

## 2021-03-02 NOTE — Telephone Encounter (Signed)
Patient calling in and states he stuck a wire in his hand. States it is now red, swollen, and painful. Patient was asking to be seen.   Informed the Patient that there are not available appointments and I could transfer him to access nurse. [T declined be transferred and states he will be seen via urgent care or emergency room.

## 2021-03-02 NOTE — Discharge Instructions (Addendum)
If the redness advances up higher in your arm tomorrow, then you need to go to ER.  Start the oral antibiotic tonight

## 2021-03-03 NOTE — Therapy (Signed)
[facility] [address] [place], [place], [address] Phone: [phone]   Fax:  [phone]  Physical Therapy Treatment and recert 12/26/39-10/11/93  Patient Details  Name: JULIA KULZER MRN: [id number] Date of Birth: 03-Jun-1931 Referring Provider (PT): Ray Church, MD   Encounter Date: 03/02/2021   PT End of Session - 03/03/21 0959     Visit Number 17    Number of Visits 22    Date for PT Re-Evaluation 03/31/21    Authorization - Visit Number 6    Authorization - Number of Visits 10    Progress Note Due on Visit 10    PT Start Time 1431    PT Stop Time 1517    PT Time Calculation (min) 46 min    Equipment Utilized During Treatment Other (comment)    Activity Tolerance Patient tolerated treatment well;Patient limited by pain    Behavior During Therapy [facility] for tasks assessed/performed             Past Medical History:  Diagnosis Date   Aortic stenosis    Arthritis    right shoulder   BPH (benign prostatic hypertrophy)    Coronary artery disease    Gout    Heart murmur    History of chicken pox    Hypercholesterolemia    Hypertension    Systolic heart failure ([place])    ischemic cardiomyopathy   Wears dentures    full upper and lower    Past Surgical History:  Procedure Laterality Date   AORTIC VALVE REPLACEMENT N/A 01/25/2019   Procedure: AORTIC VALVE REPLACEMENT (AVR) 65mm Inspiris valve;  Surgeon: Gaye Pollack, MD;  [facility];  Service: Open Heart Surgery;  Laterality: N/A;   arm surgery     right arm fx s/p "plate insertion"   CARDIAC CATHETERIZATION     CATARACT EXTRACTION W/PHACO Right 11/17/2016   Procedure: CATARACT EXTRACTION PHACO AND INTRAOCULAR LENS PLACEMENT ([place])  Right;  Surgeon: Leandrew Koyanagi, MD;  Location: [place];  Service: Ophthalmology;  Laterality: Right;   CORONARY ARTERY BYPASS GRAFT N/A 01/25/2019   LIMA-LAD and sequential SVG-rPDA-rPL   DENTAL  SURGERY     all teeth extracted   RIGHT HEART CATH AND CORONARY ANGIOGRAPHY N/A 01/02/2019   Procedure: RIGHT HEART CATH AND CORONARY ANGIOGRAPHY;  Surgeon: Nelva Bush, MD;  Location: [place] [facility];  Service: Cardiovascular;  Laterality: N/A;   TEE WITHOUT CARDIOVERSION N/A 01/25/2019   Procedure: TRANSESOPHAGEAL ECHOCARDIOGRAM (TEE);  Surgeon: Gaye Pollack, MD;  Location: [place];  Service: Open Heart Surgery;  Laterality: N/A;    There were no vitals filed for this visit.   Subjective Assessment - 03/02/21 1716     Subjective Pt present to tx with L elbow skin tear and R thenar punction wound form house projects. Pt states 2/10 shoulder pain and 70% decrease in "stinger" pain with new gabapentin dosage.    Pertinent History Pt's MRI results were available 11/27/2020 with mild joint changes. Pt reported abnormal EMG studies that was conducted 2-3 weeks ago. Pt is retired from an [employer] job that required traveling. Hobbies include yards work and maintaining his home. Pt is R hand dominate. Pt stated moderate aggravation with current information that was made available to him during his current treatment. Pt requested transparency with all test and measures as well as prognosis and progression.    How long can you sit comfortably? Deferred to later tx  How long can you stand comfortably? Deferred to later tx    How long can you walk comfortably? Deferred to later tx    Diagnostic tests MRI and EMG    Patient Stated Goals " Get better"    Currently in Pain? Yes    Pain Score 2     Pain Location Shoulder    Pain Orientation Left    Pain Descriptors / Indicators Aching    Pain Onset More than a month ago               Reassessment:  FOTO: deferred 2/2 time   MMT:  MMT R Shoulder   Flexion: 2-/5  Abduction: 2-/5  in supine *pt is able to complete more ROM, however does not have the full ROM 2/2 poor strength  ROM: 9/12: PROM LSupine:  Flex: 93  Abd: 67  ER:  26  Dressing: Pt is currently min assist during dressing  Integumentary: Pt displayed signs of injection at R thenar with redness and warm traveling proximal up the ant forearm.   Observation: greater return to scapular depression compared to start of tx.   Treatment:   Therapeutic Exercise:   UE bike set up: seat 8; L 4; R 5, 52min fwd, 36min bwd.   BTB scapular row and and bilat shoulder ext x30 each movement. Pt requires contact and verbal cueing to ensure optimal loading and safe mechanics of the L shoulder     Manual Therapy: 18 mins   Position 1/4 side-lying with pillow support due to inability to lay fully on R side. 1) Grade 2-3 scapular depression with occ sustained holds at end range  2) STM at neutral and 90 deg of abd to L infraspinatus, teres minor, pectoralis minor/major upper trap, and levator scapulae.  -alternating bouts between 1 and 2.        PT Education - 03/02/21 1718     Education Details Pt was educated on proper scapular and paraspcapular muscle activation during banded exercises.    Person(s) Educated Patient    Methods Explanation;Demonstration;Tactile cues;Verbal cues    Comprehension Verbalized understanding;Need further instruction                 PT Long Term Goals - 03/03/21 1003       PT LONG TERM GOAL #1   Title Pt will improve FOTO score to predicted value of 59 to improve independence with ADLs    Baseline 25 9/13: Deferred    Time 4    Period Weeks    Status Not Met    Target Date 03/31/21      PT LONG TERM GOAL #2   Title Pt will improve L shoulder strengt tested via MMT Flex/abd to 2/5 to promote increase LUE use during grooming and feeding.    Baseline 0/5 08/10: MMT R Shoulder   Flexion: 2-/5  Abduction: 2-/5  in supine 9/12:  MMT R Shoulder   Flexion: 2-/5  Abduction: 2-/5  in supine *pt is able to complete more ROM, however does not have the full ROM 2/2 poor strength    Time 4    Period Weeks    Status Partially Met     Target Date 03/31/21      PT LONG TERM GOAL #3   Title Pt will improve L PROM equal to R to maintain glenohumeral joint health for future functional strength gains.    Baseline L flex105  Abd 68  R flexion/abd 110; 08/10  L shoulder: AROM L shoulder: deg.  Sitting  Flex: 0  Abd: 0  Supine:  Flex: 29  Abd: 20  ER: 10     PROM L shoulder: deg.  Supine:  Flex: 109  Abd: 101  ER: 26 9/12: PROM LSupine:  Flex: 93  Abd: 67  ER: 26    Time 4    Period Weeks    Status Partially Met    Target Date 03/31/21      PT LONG TERM GOAL #4   Title Pt reports ability to don/doff pants and shirts MI without spouse assist to improve dressing ADL's.    Baseline Spouse current Mod-Max assist during dressing. 9/12: Pt is currently min ass during dressing    Time 4    Period Weeks    Status Partially Met    Target Date 03/31/21                   Plan - 03/03/21 1000     Clinical Impression Statement Pt was reassess on the date. 03/03/2021. Pt assessment displayed the following:  FOTO: deferred 2/2 time.  MMT:  MMT R Shoulder   Flexion: 2-/5  Abduction: 2-/5  in supine *pt is able to complete more ROM, however does not have the full ROM 2/2 poor strength. ROM: 9/12: PROM LSupine:  Flex: 93  Abd: 67  ER: 26. Dressing: Pt is currently min ass during dressing.? Observation: greater return to scapular depression compared to start of tx. Integumentary: Pt displayed signs of injection at R thenar with redness and warm traveling proximal up the ant forearm. Pt was strongly encourage to f/u with ED for thenar infection. Pt has displayed in a slowing in progression of therapeutic gains and will benefit from a progression in tx and 4x week for optimal independence with HEP and functional mobility in the home and community. Pt. will continue to benefit from skilled physical therapy to progress POC to address remaining deficits to facilitate maximum functional capacity for optimal personal health and wellness for ADLs.     Personal Factors and Comorbidities Age;Comorbidity 3+;Time since onset of injury/illness/exacerbation    Examination-Activity Limitations Bed Mobility;Carry;Lift;Dressing;Reach Overhead;Self Feeding;Transfers    Examination-Participation Restrictions Yard Work    Stability/Clinical Decision Making Unstable/Unpredictable    Clinical Decision Making High    Rehab Potential Fair    PT Frequency 4x / week    PT Duration 8 weeks    PT Treatment/Interventions ADLs/Self Care Home Management;Aquatic Therapy;Biofeedback;Cryotherapy;Electrical Stimulation;Moist Heat;Traction;Ultrasound;Fluidtherapy;Contrast Bath;Gait training;Stair training;Functional mobility training;Therapeutic activities;Therapeutic exercise;Balance training;Neuromuscular re-education;Patient/family education;Manual techniques;Passive range of motion;Dry needling;Energy conservation;Splinting;Taping;Joint Manipulations    PT Next Visit Plan Reassess puncture wound infection. UPDATE HEP.    PT Home Exercise Plan Access Code: [email or web address]    Consulted and Agree with Plan of Care Patient             Patient will benefit from skilled therapeutic intervention in order to improve the following deficits and impairments:  Decreased activity tolerance, Decreased coordination, Decreased strength, Impaired UE functional use, Pain, Postural dysfunction, Improper body mechanics, Decreased range of motion, Decreased endurance, Decreased mobility, Decreased skin integrity, Hypomobility, Impaired sensation  Visit Diagnosis: Shoulder weakness  Abnormal posture     Problem List Patient Active Problem List   Diagnosis Date Noted   Shingles 10/12/2020   Shoulder pain, left 10/12/2020   AKI (acute kidney injury) (HCC) 10/20/2019   Dilatation of thoracic aorta (HCC) 10/19/2019   Pedal edema 07/11/2019   Daytime somnolence 06/02/2019  Coronary artery disease involving native coronary artery of native heart without angina pectoris 02/08/2019    Chronic HFrEF (heart failure with reduced ejection fraction) ([place]) 02/08/2019   S/P aortic valve replacement 02/08/2019   Postoperative anemia 02/08/2019   S/P CABG x 3 02/03/8386   Acute systolic congestive heart failure ([place]) 12/21/2018   Biventricular failure ([place]) 12/21/2018   Nonrheumatic aortic valve stenosis 12/21/2018   SOB (shortness of breath) 12/11/2018   Hypoxia 12/11/2018   Shortness of breath 12/11/2018   Right shoulder pain 05/08/2015   Health care maintenance 11/12/2014   BMI 32.0-32.9,adult 06/18/2014   Elevated PSA 02/17/2014   CKD (chronic kidney disease) stage 3, GFR 30-59 ml/min (HCC) 05/21/2012   Gout 05/21/2012   Hypertension 05/02/2012   Hyperlipidemia LDL goal <70 05/02/2012   Diabetes ([place]) 05/02/2012   Pura Spice, PT, DPT # 18 Fara Olden, SPT 03/03/2021, 10:18 AM  [facility] [address]. [place], [place], [address] Phone: [phone]   Fax:  [phone]  Name: LANNIS LICHTENWALNER MRN: [id number] Date of Birth: [birth year]

## 2021-03-03 NOTE — Telephone Encounter (Signed)
FYI-  Confirmed patient was evaluated at [facility] and was given a Tdap and rocephin injection.

## 2021-03-03 NOTE — Telephone Encounter (Signed)
Noted.  Let him know that I was not in the office yesterday pm.  Please call and confirm doing ok and to let us know if any problems.

## 2021-03-04 ENCOUNTER — Other Ambulatory Visit: Payer: Self-pay

## 2021-03-04 ENCOUNTER — Encounter: Payer: Self-pay | Admitting: Physical Therapy

## 2021-03-04 ENCOUNTER — Ambulatory Visit: Payer: Medicare HMO | Admitting: Physical Therapy

## 2021-03-04 DIAGNOSIS — R293 Abnormal posture: Secondary | ICD-10-CM

## 2021-03-04 DIAGNOSIS — M79602 Pain in left arm: Secondary | ICD-10-CM

## 2021-03-04 DIAGNOSIS — R29898 Other symptoms and signs involving the musculoskeletal system: Secondary | ICD-10-CM | POA: Diagnosis not present

## 2021-03-04 NOTE — Telephone Encounter (Signed)
Patient aware and doing ok. Will let us know if he needs anything

## 2021-03-04 NOTE — Therapy (Signed)
[facility] [address]. [place], [place], [address] Phone: [phone]   Fax:  [phone]  Physical Therapy Treatment  Patient Details  Name: [name] [name] MRN: [id number] Date of Birth: [birth year] Referring Provider (PT): Ray Church, MD   Encounter Date: 03/04/2021   PT End of Session - 03/04/21 1515     Visit Number 18    Number of Visits 33    Date for PT Re-Evaluation 04/27/21    Authorization - Visit Number 7    Authorization - Number of Visits 10    Progress Note Due on Visit 10    PT Start Time 0383    PT Stop Time 1505    PT Time Calculation (min) 23 min    Equipment Utilized During Treatment Other (comment)    Activity Tolerance Patient tolerated treatment well;Patient limited by pain    Behavior During Therapy [facility] for tasks assessed/performed             Past Medical History:  Diagnosis Date   Aortic stenosis    Arthritis    right shoulder   BPH (benign prostatic hypertrophy)    Coronary artery disease    Gout    Heart murmur    History of chicken pox    Hypercholesterolemia    Hypertension    Systolic heart failure ([place])    ischemic cardiomyopathy   Wears dentures    full upper and lower    Past Surgical History:  Procedure Laterality Date   AORTIC VALVE REPLACEMENT N/A 01/25/2019   Procedure: AORTIC VALVE REPLACEMENT (AVR) 78m Inspiris valve;  Surgeon: BGaye Pollack MD;  Location: [facility] OR;  Service: Open Heart Surgery;  Laterality: N/A;   arm surgery     right arm fx s/p "plate insertion"   CARDIAC CATHETERIZATION     CATARACT EXTRACTION W/PHACO Right 11/17/2016   Procedure: CATARACT EXTRACTION PHACO AND INTRAOCULAR LENS PLACEMENT ([place]  Right;  Surgeon: BLeandrew Koyanagi MD;  [place] [facility]  Service: Ophthalmology;  Laterality: Right;   CORONARY ARTERY BYPASS GRAFT N/A 01/25/2019   LIMA-LAD and sequential SVG-rPDA-rPL   DENTAL SURGERY     all teeth  extracted   RIGHT HEART CATH AND CORONARY ANGIOGRAPHY N/A 01/02/2019   Procedure: RIGHT HEART CATH AND CORONARY ANGIOGRAPHY;  Surgeon: ENelva Bush MD;  [facility];  Service: Cardiovascular;  Laterality: N/A;   TEE WITHOUT CARDIOVERSION N/A 01/25/2019   Procedure: TRANSESOPHAGEAL ECHOCARDIOGRAM (TEE);  Surgeon: BGaye Pollack MD;  [place] [facility]  Service: Open Heart Surgery;  Laterality: N/A;    There were no vitals filed for this visit.   Subjective Assessment - 03/04/21 1512     Subjective Pt presents to tx without L shoulder pain. Pt's R thenar displays decrease redness and signs of infection compared to previous tx. Pt went to the ED after last tx receiving; antibiotic shot, tetanus shot, and antibiotic prescription. Pt requests a shorter tx due to nausea like sx 2/2 antibiotic prescription.    Pertinent History Pt's MRI results were available 11/27/2020 with mild joint changes. Pt reported abnormal EMG studies that was conducted 2-3 weeks ago. Pt is retired from an [employer] that required traveling. Hobbies include yards work and maintaining his home. Pt is R hand dominate. Pt stated moderate aggravation with current information that was made available to him during his current treatment. Pt requested transparency with all test and measures as well as prognosis  and progression.    How long can you sit comfortably? Deferred to later tx    How long can you stand comfortably? Deferred to later tx    How long can you walk comfortably? Deferred to later tx    Diagnostic tests MRI and EMG    Patient Stated Goals " Get better"    Currently in Pain? No/denies    Pain Score 0-No pain    Pain Onset More than a month ago             Treatment:   Therapeutic Exercise:   UE bike set up: seat 8; L 4; R 5, 2.51mn fwd, 2.561m bwd.    RTB scapular row and and bilat shoulder ext x30 each movement. Pt requires contact and verbal cueing to ensure optimal loading and safe  mechanics of the L shoulder  Stair rail shoulder flexion slide x20 with contact cueing to ensure optimal GH and scapular mobility   ABD with ball on table, set to a height for 90 deg of shoulder flexion. Pt reports mild pain in ant GH joint.   Supine ABDs with shoulder at 90 deg of flexion   Supine shoulder flexion without contact assist x20      Manual Therapy: 18 mins   Position supine 2) STM at neutral to L bicep , tricep, pectoralis minor/major upper trap, and deltoid.  -alternating bouts between 1 and 2.        PT Education - 03/04/21 1515     Education Details Pt was educated on completing supine shoulder flexion x30 in the morning and night.    Person(s) Educated Patient    Methods Explanation;Demonstration;Tactile cues;Verbal cues    Comprehension Verbalized understanding;Returned demonstration                 PT Long Term Goals - 03/03/21 1003       PT LONG TERM GOAL #1   Title Pt will improve FOTO score to predicted value of 59 to improve independence with ADLs    Baseline 25 9/13: Deferred    Time 8    Period Weeks    Status Not Met    Target Date 04/27/21      PT LONG TERM GOAL #2   Title Pt will improve L shoulder strengt tested via MMT Flex/abd to 2/5 to promote increase LUE use during grooming and feeding.    Baseline 0/5 08/10: MMT R Shoulder   Flexion: 2-/5  Abduction: 2-/5  in supine 9/12:  MMT R Shoulder   Flexion: 2-/5  Abduction: 2-/5  in supine *pt is able to complete more ROM, however does not have the full ROM 2/2 poor strength    Time 8    Period Weeks    Status Partially Met    Target Date 04/27/21      PT LONG TERM GOAL #3   Title Pt will improve L PROM equal to R to maintain glenohumeral joint health for future functional strength gains.    Baseline L flex105  Abd 68  R flexion/abd 110; 08/10 L shoulder: AROM L shoulder: deg.  Sitting  Flex: 0  Abd: 0  Supine:  Flex: 29  Abd: 20  ER: 10     PROM L shoulder: deg.  Supine:  Flex:  109  Abd: 101  ER: 26 9/12: PROM LSupine:  Flex: 93  Abd: 67  ER: 26    Time 8    Period Weeks    Status  Partially Met    Target Date 04/27/21      PT LONG TERM GOAL #4   Title Pt reports ability to don/doff pants and shirts MI without spouse assist to improve dressing ADL's.    Baseline Spouse current Mod-Max assist during dressing. 9/12: Pt is currently min ass during dressing    Time 8    Period Weeks    Status Partially Met    Target Date 04/27/21                   Plan - 03/04/21 1516     Clinical Impression Statement Today's tx was focused on progression shoulder strengthening with banded and stability therex. Pt was able to complete supine shoulder flexion independently for the first tx today. Pt was able to complete all intervention without increase in pain and decreased stiffness post manual intervention. Pt continues to display mark UE endurance, strength, and ROM deficits resulting in decreased safe home/community mobiity, increased pain, and limited access to QOL.  Pt. will continue to benefit from skilled physical therapy to progress POC to address remaining deficits to facilitate maximum functional capacity for optimal personal health and wellness for ADLs.    Personal Factors and Comorbidities Age;Comorbidity 3+;Time since onset of injury/illness/exacerbation    Examination-Activity Limitations Bed Mobility;Carry;Lift;Dressing;Reach Overhead;Self Feeding;Transfers    Examination-Participation Restrictions Yard Work    Stability/Clinical Decision Making Unstable/Unpredictable    Clinical Decision Making High    Rehab Potential Fair    PT Frequency 2x / week    PT Duration 8 weeks    PT Treatment/Interventions ADLs/Self Care Home Management;Aquatic Therapy;Biofeedback;Cryotherapy;Electrical Stimulation;Moist Heat;Traction;Ultrasound;Fluidtherapy;Contrast Bath;Gait training;Stair training;Functional mobility training;Therapeutic activities;Therapeutic exercise;Balance  training;Neuromuscular re-education;Patient/family education;Manual techniques;Passive range of motion;Dry needling;Energy conservation;Splinting;Taping;Joint Manipulations    PT Next Visit Plan Reassess puncture wound infection. UPDATE HEP.    PT Home Exercise Plan Supine shoulder flexion x30 morning and night.    Consulted and Agree with Plan of Care Patient             Patient will benefit from skilled therapeutic intervention in order to improve the following deficits and impairments:  Decreased activity tolerance, Decreased coordination, Decreased strength, Impaired UE functional use, Pain, Postural dysfunction, Improper body mechanics, Decreased range of motion, Decreased endurance, Decreased mobility, Decreased skin integrity, Hypomobility, Impaired sensation  Visit Diagnosis: Shoulder weakness  Abnormal posture  Pain of left upper extremity     Problem List Patient Active Problem List   Diagnosis Date Noted   Shingles 10/12/2020   Shoulder pain, left 10/12/2020   AKI (acute kidney injury) ([place]) 10/20/2019   Dilatation of thoracic aorta ([place]) 10/19/2019   Pedal edema 07/11/2019   Daytime somnolence 06/02/2019   Coronary artery disease involving native coronary artery of native heart without angina pectoris 02/08/2019   Chronic HFrEF (heart failure with reduced ejection fraction) ([place]) 02/08/2019   S/P aortic valve replacement 02/08/2019   Postoperative anemia 02/08/2019   S/P CABG x 3 99/77/4142   Acute systolic congestive heart failure ([place]) 12/21/2018   Biventricular failure ([place]) 12/21/2018   Nonrheumatic aortic valve stenosis 12/21/2018   SOB (shortness of breath) 12/11/2018   Hypoxia 12/11/2018   Shortness of breath 12/11/2018   Right shoulder pain 05/08/2015   Health care maintenance 11/12/2014   BMI 32.0-32.9,adult 06/18/2014   Elevated PSA 02/17/2014   CKD (chronic kidney disease) stage 3, GFR 30-59 ml/min (HCC) 05/21/2012   Gout 05/21/2012    Hypertension 05/02/2012   Hyperlipidemia LDL goal <70 05/02/2012  Diabetes ([place]) 05/02/2012   Pura Spice, PT, DPT # [id number] Fara Olden, SPT 03/04/2021, 8:32 PM  [facility] [address] [place], [place], [address] Phone: [phone]   Fax:  [phone]  Name: [name] [name] MRN: [id number] Date of Birth: May 11, 1931

## 2021-03-06 DIAGNOSIS — E538 Deficiency of other specified B group vitamins: Secondary | ICD-10-CM | POA: Diagnosis not present

## 2021-03-09 ENCOUNTER — Encounter: Payer: Self-pay | Admitting: Physical Therapy

## 2021-03-09 ENCOUNTER — Other Ambulatory Visit: Payer: Self-pay

## 2021-03-09 ENCOUNTER — Ambulatory Visit: Payer: Medicare HMO | Admitting: Physical Therapy

## 2021-03-09 DIAGNOSIS — R293 Abnormal posture: Secondary | ICD-10-CM

## 2021-03-09 DIAGNOSIS — M79602 Pain in left arm: Secondary | ICD-10-CM | POA: Diagnosis not present

## 2021-03-09 DIAGNOSIS — R29898 Other symptoms and signs involving the musculoskeletal system: Secondary | ICD-10-CM

## 2021-03-09 NOTE — Therapy (Signed)
[facility] [address] [place], [place], [address] Phone: [phone]   Fax:  [phone]  Physical Therapy Treatment  Patient Details  Name: [name] [name] MRN: [id number] Date of Birth: 31-Mar-1931 Referring Provider (PT): Jolene Provost, MD   Encounter Date: 03/09/2021   PT End of Session - 03/09/21 1536     Visit Number 19    Number of Visits 33    Date for PT Re-Evaluation 04/27/21    Authorization - Visit Number 9    Authorization - Number of Visits 10    Progress Note Due on Visit 10    PT Start Time 1429    PT Stop Time 1516    PT Time Calculation (min) 47 min    Equipment Utilized During Treatment Other (comment)    Activity Tolerance Patient tolerated treatment well;Patient limited by pain    Behavior During Therapy [facility] for tasks assessed/performed             Past Medical History:  Diagnosis Date   Aortic stenosis    Arthritis    right shoulder   BPH (benign prostatic hypertrophy)    Coronary artery disease    Gout    Heart murmur    History of chicken pox    Hypercholesterolemia    Hypertension    Systolic heart failure (HCC)    ischemic cardiomyopathy   Wears dentures    full upper and lower    Past Surgical History:  Procedure Laterality Date   AORTIC VALVE REPLACEMENT N/A 01/25/2019   Procedure: AORTIC VALVE REPLACEMENT (AVR) 78mm Inspiris valve;  Surgeon: Alleen Borne, MD;  [facility];  Service: Open Heart Surgery;  Laterality: N/A;   arm surgery     right arm fx s/p "plate insertion"   CARDIAC CATHETERIZATION     CATARACT EXTRACTION W/PHACO Right 11/17/2016   Procedure: CATARACT EXTRACTION PHACO AND INTRAOCULAR LENS PLACEMENT ([facility])  Right;  Surgeon: Lockie Mola, MD;  [facility];  Service: Ophthalmology;  Laterality: Right;   CORONARY ARTERY BYPASS GRAFT N/A 01/25/2019   LIMA-LAD and sequential SVG-rPDA-rPL   DENTAL SURGERY     all teeth  extracted   RIGHT HEART CATH AND CORONARY ANGIOGRAPHY N/A 01/02/2019   Procedure: RIGHT HEART CATH AND CORONARY ANGIOGRAPHY;  Surgeon: Yvonne Kendall, MD;  Location: [facility];  Service: Cardiovascular;  Laterality: N/A;   TEE WITHOUT CARDIOVERSION N/A 01/25/2019   Procedure: TRANSESOPHAGEAL ECHOCARDIOGRAM (TEE);  Surgeon: Alleen Borne, MD;  Location[facility] OR;  Service: Open Heart Surgery;  Laterality: N/A;    There were no vitals filed for this visit.   Subjective Assessment - 03/09/21 1435     Subjective Pt presents to tx without any pain at the start of tx. Pt stated that he had an active weekend leading to moderate soreness that returned to baseline within 24 hours. Pt stated mod soreness after last tx that reduced to baseline within 48 hours as well.    Pertinent History Pt's MRI results were available 11/27/2020 with mild joint changes. Pt reported abnormal EMG studies that was conducted 2-3 weeks ago. Pt is retired from an [employer] job that required traveling. Hobbies include yards work and maintaining his home. Pt is R hand dominate. Pt stated moderate aggravation with current information that was made available to him during his current treatment. Pt requested transparency with all test and measures as well as prognosis and progression.  How long can you sit comfortably? --    How long can you stand comfortably? --    How long can you walk comfortably? --    Diagnostic tests MRI and EMG    Patient Stated Goals " Get better"    Currently in Pain? No/denies    Pain Score 0-No pain    Pain Onset More than a month ago               Treatment:   Therapeutic Exercise:   UE bike set up: seat 8; L 4; R 5, 11min fwd, 16min bwd.    RTB scapular row, bilat shoulder ext, R shoulder adduction  x20 each movement.   Nautilus 20# Pt requires contact and verbal cueing to ensure optimal loading and safe mechanics of the L shoulder  Supine shoulder flexion without contact assist  x20      Manual Therapy: 14 mins   Position supine  STM at neutral to L bicep , tricep, pectoralis minor/major upper trap, and deltoid.  -alternating inferior scapular mobs bouts between [facility]         PT Education - 03/09/21 1439     Education Details PT was educated on optimal form and technique with UE therex to promote strengthening without compromising safety of the Horatio joint.    Person(s) Educated Patient    Methods Explanation;Demonstration;Tactile cues;Verbal cues    Comprehension Verbalized understanding;Need further instruction                 PT Long Term Goals - 03/03/21 1003       PT LONG TERM GOAL #1   Title Pt will improve FOTO score to predicted value of 59 to improve independence with ADLs    Baseline 25 9/13: Deferred    Time 8    Period Weeks    Status Not Met    Target Date 04/27/21      PT LONG TERM GOAL #2   Title Pt will improve L shoulder strengt tested via MMT Flex/abd to 2/5 to promote increase LUE use during grooming and feeding.    Baseline 0/5 08/10: MMT R Shoulder   Flexion: 2-/5  Abduction: 2-/5  in supine 9/12:  MMT R Shoulder   Flexion: 2-/5  Abduction: 2-/5  in supine *pt is able to complete more ROM, however does not have the full ROM 2/2 poor strength    Time 8    Period Weeks    Status Partially Met    Target Date 04/27/21      PT LONG TERM GOAL #3   Title Pt will improve L PROM equal to R to maintain glenohumeral joint health for future functional strength gains.    Baseline L flex105  Abd 68  R flexion/abd 110; 08/10 L shoulder: AROM L shoulder: deg.  Sitting  Flex: 0  Abd: 0  Supine:  Flex: 29  Abd: 20  ER: 10     PROM L shoulder: deg.  Supine:  Flex: 109  Abd: 101  ER: 26 9/12: PROM LSupine:  Flex: 93  Abd: 67  ER: 26    Time 8    Period Weeks    Status Partially Met    Target Date 04/27/21      PT LONG TERM GOAL #4   Title Pt reports ability to don/doff pants and shirts MI without spouse assist to improve dressing ADL's.     Baseline Spouse current Mod-Max assist during dressing. 9/12: Pt is  currently min ass during dressing    Time 8    Period Weeks    Status Partially Met    Target Date 04/27/21                   Plan - 03/09/21 1537     Clinical Impression Statement Today's tx was focused on addition of Nautilus UE therex. Pt requires extensive contact and verbal cueing to ensure safe and proper GH and scapular mobility. Pt continues to display mark UE endurance, strength, and ROM deficits resulting in decreased safe home/community mobiity, increased pain, and limited access to QOL. Pt. will continue to benefit from skilled physical therapy to progress POC to address remaining deficits to facilitate maximum functional capacity for optimal personal health and wellness for ADLs.    Personal Factors and Comorbidities Age;Comorbidity 3+;Time since onset of injury/illness/exacerbation    Examination-Activity Limitations Bed Mobility;Carry;Lift;Dressing;Reach Overhead;Self Feeding;Transfers    Examination-Participation Restrictions Yard Work    Stability/Clinical Decision Making Unstable/Unpredictable    Clinical Decision Making High    Rehab Potential Fair    PT Frequency 2x / week    PT Duration 8 weeks    PT Treatment/Interventions ADLs/Self Care Home Management;Aquatic Therapy;Biofeedback;Cryotherapy;Electrical Stimulation;Moist Heat;Traction;Ultrasound;Fluidtherapy;Contrast Bath;Gait training;Stair training;Functional mobility training;Therapeutic activities;Therapeutic exercise;Balance training;Neuromuscular re-education;Patient/family education;Manual techniques;Passive range of motion;Dry needling;Energy conservation;Splinting;Taping;Joint Manipulations    PT Next Visit Plan Progress Naultilus therex.  10th visit progress note next tx.  Discuss ortho f/u visit    PT Home Exercise Plan Supine shoulder flexion x30 morning and night.    Consulted and Agree with Plan of Care Patient              Patient will benefit from skilled therapeutic intervention in order to improve the following deficits and impairments:  Decreased activity tolerance, Decreased coordination, Decreased strength, Impaired UE functional use, Pain, Postural dysfunction, Improper body mechanics, Decreased range of motion, Decreased endurance, Decreased mobility, Decreased skin integrity, Hypomobility, Impaired sensation  Visit Diagnosis: Shoulder weakness  Abnormal posture  Pain of left upper extremity     Problem List Patient Active Problem List   Diagnosis Date Noted   Shingles 10/12/2020   Shoulder pain, left 10/12/2020   AKI (acute kidney injury) ([place]) 10/20/2019   Dilatation of thoracic aorta ([place]) 10/19/2019   Pedal edema 07/11/2019   Daytime somnolence 06/02/2019   Coronary artery disease involving native coronary artery of native heart without angina pectoris 02/08/2019   Chronic HFrEF (heart failure with reduced ejection fraction) ([place]) 02/08/2019   S/P aortic valve replacement 02/08/2019   Postoperative anemia 02/08/2019   S/P CABG x 3 66/29/4765   Acute systolic congestive heart failure ([place]) 12/21/2018   Biventricular failure ([place]) 12/21/2018   Nonrheumatic aortic valve stenosis 12/21/2018   SOB (shortness of breath) 12/11/2018   Hypoxia 12/11/2018   Shortness of breath 12/11/2018   Right shoulder pain 05/08/2015   Health care maintenance 11/12/2014   BMI 32.0-32.9,adult 06/18/2014   Elevated PSA 02/17/2014   CKD (chronic kidney disease) stage 3, GFR 30-59 ml/min (HCC) 05/21/2012   Gout 05/21/2012   Hypertension 05/02/2012   Hyperlipidemia LDL goal <70 05/02/2012   Diabetes ([place]) 05/02/2012   Pura Spice, PT, DPT # [id number] Fara Olden, SPT 03/09/2021, 8:20 PM  [facility] [address] [place], [place], [address] Phone: [phone]   Fax:  [phone]  Name: [name] [name] MRN: [id number] Date of Birth:  1930/07/12

## 2021-03-11 ENCOUNTER — Encounter: Payer: Self-pay | Admitting: Physical Therapy

## 2021-03-11 ENCOUNTER — Other Ambulatory Visit (HOSPITAL_BASED_OUTPATIENT_CLINIC_OR_DEPARTMENT_OTHER): Payer: Self-pay | Admitting: Orthopedic Surgery

## 2021-03-11 ENCOUNTER — Other Ambulatory Visit: Payer: Self-pay | Admitting: Orthopedic Surgery

## 2021-03-11 ENCOUNTER — Ambulatory Visit: Payer: Medicare HMO | Admitting: Physical Therapy

## 2021-03-11 ENCOUNTER — Other Ambulatory Visit: Payer: Self-pay

## 2021-03-11 DIAGNOSIS — R29898 Other symptoms and signs involving the musculoskeletal system: Secondary | ICD-10-CM | POA: Diagnosis not present

## 2021-03-11 DIAGNOSIS — R293 Abnormal posture: Secondary | ICD-10-CM | POA: Diagnosis not present

## 2021-03-11 DIAGNOSIS — M25312 Other instability, left shoulder: Secondary | ICD-10-CM | POA: Diagnosis not present

## 2021-03-11 DIAGNOSIS — G8929 Other chronic pain: Secondary | ICD-10-CM | POA: Diagnosis not present

## 2021-03-11 DIAGNOSIS — M79602 Pain in left arm: Secondary | ICD-10-CM | POA: Diagnosis not present

## 2021-03-11 DIAGNOSIS — M25512 Pain in left shoulder: Secondary | ICD-10-CM

## 2021-03-11 DIAGNOSIS — E669 Obesity, unspecified: Secondary | ICD-10-CM | POA: Diagnosis not present

## 2021-03-11 DIAGNOSIS — M19012 Primary osteoarthritis, left shoulder: Secondary | ICD-10-CM | POA: Diagnosis not present

## 2021-03-11 DIAGNOSIS — E1369 Other specified diabetes mellitus with other specified complication: Secondary | ICD-10-CM | POA: Diagnosis not present

## 2021-03-11 NOTE — Therapy (Signed)
[facility] [address] [place], [place], [address] Phone: [phone]   Fax:  [phone]  Physical Therapy Treatment Physical Therapy Progress Note   Dates of reporting period  02/02/2021  to  03/11/2021  Patient Details  Name: [name] [name] MRN: [id number] Date of Birth: August 14, 1930 Referring Provider (PT): Ray Church, MD   Encounter Date: 03/11/2021   PT End of Session - 03/11/21 1445     Visit Number 20    Number of Visits 33    Date for PT Re-Evaluation 04/27/21    Authorization - Visit Number 10    Authorization - Number of Visits 10    Progress Note Due on Visit 10    PT Start Time 1027    PT Stop Time 2536    PT Time Calculation (min) 48 min    Equipment Utilized During Treatment Other (comment)    Activity Tolerance Patient tolerated treatment well;Patient limited by pain    Behavior During Therapy [facility] for tasks assessed/performed             Past Medical History:  Diagnosis Date   Aortic stenosis    Arthritis    right shoulder   BPH (benign prostatic hypertrophy)    Coronary artery disease    Gout    Heart murmur    History of chicken pox    Hypercholesterolemia    Hypertension    Systolic heart failure ([place])    ischemic cardiomyopathy   Wears dentures    full upper and lower    Past Surgical History:  Procedure Laterality Date   AORTIC VALVE REPLACEMENT N/A 01/25/2019   Procedure: AORTIC VALVE REPLACEMENT (AVR) 34m Inspiris valve;  Surgeon: BGaye Pollack MD;  [facility];  Service: Open Heart Surgery;  Laterality: N/A;   arm surgery     right arm fx s/p "plate insertion"   CARDIAC CATHETERIZATION     CATARACT EXTRACTION W/PHACO Right 11/17/2016   Procedure: CATARACT EXTRACTION PHACO AND INTRAOCULAR LENS PLACEMENT ([place]  Right;  Surgeon: BLeandrew Koyanagi MD;  [facility]  Service: Ophthalmology;  Laterality: Right;   CORONARY ARTERY BYPASS GRAFT  N/A 01/25/2019   LIMA-LAD and sequential SVG-rPDA-rPL   DENTAL SURGERY     all teeth extracted   RIGHT HEART CATH AND CORONARY ANGIOGRAPHY N/A 01/02/2019   Procedure: RIGHT HEART CATH AND CORONARY ANGIOGRAPHY;  Surgeon: ENelva Bush MD;  [facility];  Service: Cardiovascular;  Laterality: N/A;   TEE WITHOUT CARDIOVERSION N/A 01/25/2019   Procedure: TRANSESOPHAGEAL ECHOCARDIOGRAM (TEE);  Surgeon: BGaye Pollack MD;  [facility]  Service: Open Heart Surgery;  Laterality: N/A;    There were no vitals filed for this visit.   Subjective Assessment - 03/11/21 1436     Subjective Pt presents to tx with 3/10 L shoulder pain and stiffness. Pt stated that his visit with the orthopedic office went well with MRI to be scheduled soon, with possible f/u in 3-4 weeks.    Pertinent History Pt's MRI results were available 11/27/2020 with mild joint changes. Pt reported abnormal EMG studies that was conducted 2-3 weeks ago. Pt is retired from an [employer] that required traveling. Hobbies include yards work and maintaining his home. Pt is R hand dominate. Pt stated moderate aggravation with current information that was made available to him during his current treatment. Pt requested transparency with all test and measures as well as prognosis and  progression.    Diagnostic tests MRI and EMG    Patient Stated Goals " Get better"    Pain Score 4     Pain Location Shoulder    Pain Orientation Left    Pain Descriptors / Indicators Aching    Pain Onset More than a month ago              Treatment:   Therapeutic Exercise:   UE bike set up: seat 8; L 4; R 5, 79mn fwd, 384m bwd.    Supine shoulder flexion, abd, and ER with only touch contact assist 3x10 each movement.    Discussed HEP     Manual Therapy: 26 mins   Position supine  STM at neutral to L bicep , tricep, pectoralis minor/major upper trap, and deltoid.  -alternating inferior scapular mobs bouts between STM 30  second bouts. Grade mod 2-3      PT Long Term Goals - 03/03/21 1003       PT LONG TERM GOAL #1   Title Pt will improve FOTO score to predicted value of 59 to improve independence with ADLs    Baseline 25 9/13: Deferred    Time 8    Period Weeks    Status Not Met    Target Date 04/27/21      PT LONG TERM GOAL #2   Title Pt will improve L shoulder strengt tested via MMT Flex/abd to 2/5 to promote increase LUE use during grooming and feeding.    Baseline 0/5 08/10: MMT R Shoulder   Flexion: 2-/5  Abduction: 2-/5  in supine 9/12:  MMT R Shoulder   Flexion: 2-/5  Abduction: 2-/5  in supine *pt is able to complete more ROM, however does not have the full ROM 2/2 poor strength    Time 8    Period Weeks    Status Partially Met    Target Date 04/27/21      PT LONG TERM GOAL #3   Title Pt will improve L PROM equal to R to maintain glenohumeral joint health for future functional strength gains.    Baseline L flex105  Abd 68  R flexion/abd 110; 08/10 L shoulder: AROM L shoulder: deg.  Sitting  Flex: 0  Abd: 0  Supine:  Flex: 29  Abd: 20  ER: 10     PROM L shoulder: deg.  Supine:  Flex: 109  Abd: 101  ER: 26 9/12: PROM LSupine:  Flex: 93  Abd: 67  ER: 26    Time 8    Period Weeks    Status Partially Met    Target Date 04/27/21      PT LONG TERM GOAL #4   Title Pt reports ability to don/doff pants and shirts MI without spouse assist to improve dressing ADL's.    Baseline Spouse current Mod-Max assist during dressing. 9/12: Pt is currently min ass during dressing    Time 8    Period Weeks    Status Partially Met    Target Date 04/27/21                   Plan - 03/11/21 1518     Clinical Impression Statement Today's tx included addition of supine gravity reduced L shoulder AROM. Pt displayed greater ability to produce movement in flexion compared to abd and ER. Pt continues to display mark UE endurance, strength, and ROM deficits resulting in decreased safe home/community  mobiity, increased pain, and limited access to  QOL. Pt. will continue to benefit from skilled physical therapy to progress POC to address remaining deficits to facilitate maximum functional capacity for optimal personal health and wellness for ADLs.    Personal Factors and Comorbidities Age;Comorbidity 3+;Time since onset of injury/illness/exacerbation    Examination-Activity Limitations Bed Mobility;Carry;Lift;Dressing;Reach Overhead;Self Feeding;Transfers    Examination-Participation Restrictions Yard Work    Stability/Clinical Decision Making Unstable/Unpredictable    Clinical Decision Making High    Rehab Potential Fair    PT Frequency 2x / week    PT Duration 8 weeks    PT Treatment/Interventions ADLs/Self Care Home Management;Aquatic Therapy;Biofeedback;Cryotherapy;Electrical Stimulation;Moist Heat;Traction;Ultrasound;Fluidtherapy;Contrast Bath;Gait training;Stair training;Functional mobility training;Therapeutic activities;Therapeutic exercise;Balance training;Neuromuscular re-education;Patient/family education;Manual techniques;Passive range of motion;Dry needling;Energy conservation;Splinting;Taping;Joint Manipulations    PT Next Visit Plan Check MRI schedule date.  Progress HEP    PT Home Exercise Plan Supine shoulder flexion x30 morning and night.    Consulted and Agree with Plan of Care Patient             Patient will benefit from skilled therapeutic intervention in order to improve the following deficits and impairments:  Decreased activity tolerance, Decreased coordination, Decreased strength, Impaired UE functional use, Pain, Postural dysfunction, Improper body mechanics, Decreased range of motion, Decreased endurance, Decreased mobility, Decreased skin integrity, Hypomobility, Impaired sensation  Visit Diagnosis: Shoulder weakness  Abnormal posture  Pain of left upper extremity     Problem List Patient Active Problem List   Diagnosis Date Noted   Shingles  10/12/2020   Shoulder pain, left 10/12/2020   AKI (acute kidney injury) ([place]) 10/20/2019   Dilatation of thoracic aorta ([place]) 10/19/2019   Pedal edema 07/11/2019   Daytime somnolence 06/02/2019   Coronary artery disease involving native coronary artery of native heart without angina pectoris 02/08/2019   Chronic HFrEF (heart failure with reduced ejection fraction) ([place]) 02/08/2019   S/P aortic valve replacement 02/08/2019   Postoperative anemia 02/08/2019   S/P CABG x 3 44/06/270   Acute systolic congestive heart failure ([place]) 12/21/2018   Biventricular failure ([place]) 12/21/2018   Nonrheumatic aortic valve stenosis 12/21/2018   SOB (shortness of breath) 12/11/2018   Hypoxia 12/11/2018   Shortness of breath 12/11/2018   Right shoulder pain 05/08/2015   Health care maintenance 11/12/2014   BMI 32.0-32.9,adult 06/18/2014   Elevated PSA 02/17/2014   CKD (chronic kidney disease) stage 3, GFR 30-59 ml/min (HCC) 05/21/2012   Gout 05/21/2012   Hypertension 05/02/2012   Hyperlipidemia LDL goal <70 05/02/2012   Diabetes ([place]) 05/02/2012   Pura Spice, PT, DPT # [id number] Fara Olden, SPT 03/12/2021, 11:10 AM  [facility] [address] [place], [place], [address] Phone: [phone]   Fax:  [phone]  Name: [name] [name] MRN: [id number] Date of Birth: 11/24/1930

## 2021-03-16 ENCOUNTER — Encounter: Payer: Self-pay | Admitting: Physical Therapy

## 2021-03-16 ENCOUNTER — Ambulatory Visit: Payer: Medicare HMO | Admitting: Physical Therapy

## 2021-03-16 ENCOUNTER — Other Ambulatory Visit: Payer: Self-pay

## 2021-03-16 DIAGNOSIS — R293 Abnormal posture: Secondary | ICD-10-CM | POA: Diagnosis not present

## 2021-03-16 DIAGNOSIS — M79602 Pain in left arm: Secondary | ICD-10-CM

## 2021-03-16 DIAGNOSIS — R29898 Other symptoms and signs involving the musculoskeletal system: Secondary | ICD-10-CM | POA: Diagnosis not present

## 2021-03-16 NOTE — Therapy (Signed)
[facility] [address] [place], [place], [address] Phone: [phone]   Fax:  [phone]  Physical Therapy Treatment  Patient Details  Name: KELYN KOSKELA MRN: [id number] Date of Birth: December 21, 1930 Referring Provider (PT): Ray Church, MD   Encounter Date: 03/16/2021   PT End of Session - 03/16/21 1533     Visit Number 21    Number of Visits 33    Date for PT Re-Evaluation 04/27/21    Authorization - Visit Number 1    Authorization - Number of Visits 10    Progress Note Due on Visit 10    PT Start Time 1431    PT Stop Time 1516    PT Time Calculation (min) 45 min    Equipment Utilized During Treatment Other (comment)    Activity Tolerance Patient tolerated treatment well;Patient limited by pain    Behavior During Therapy [facility] for tasks assessed/performed             Past Medical History:  Diagnosis Date   Aortic stenosis    Arthritis    right shoulder   BPH (benign prostatic hypertrophy)    Coronary artery disease    Gout    Heart murmur    History of chicken pox    Hypercholesterolemia    Hypertension    Systolic heart failure ([place])    ischemic cardiomyopathy   Wears dentures    full upper and lower    Past Surgical History:  Procedure Laterality Date   AORTIC VALVE REPLACEMENT N/A 01/25/2019   Procedure: AORTIC VALVE REPLACEMENT (AVR) 42m Inspiris valve;  Surgeon: BGaye Pollack MD;  [facility];  Service: Open Heart Surgery;  Laterality: N/A;   arm surgery     right arm fx s/p "plate insertion"   CARDIAC CATHETERIZATION     CATARACT EXTRACTION W/PHACO Right 11/17/2016   Procedure: CATARACT EXTRACTION PHACO AND INTRAOCULAR LENS PLACEMENT ([place]  Right;  Surgeon: BLeandrew Koyanagi MD;  [facility]  Service: Ophthalmology;  Laterality: Right;   CORONARY ARTERY BYPASS GRAFT N/A 01/25/2019   LIMA-LAD and sequential SVG-rPDA-rPL   DENTAL SURGERY     all teeth  extracted   RIGHT HEART CATH AND CORONARY ANGIOGRAPHY N/A 01/02/2019   Procedure: RIGHT HEART CATH AND CORONARY ANGIOGRAPHY;  Surgeon: ENelva Bush MD;  [facility];  Service: Cardiovascular;  Laterality: N/A;   TEE WITHOUT CARDIOVERSION N/A 01/25/2019   Procedure: TRANSESOPHAGEAL ECHOCARDIOGRAM (TEE);  Surgeon: BGaye Pollack MD;  [facility]  Service: Open Heart Surgery;  Laterality: N/A;    There were no vitals filed for this visit.   Subjective Assessment - 03/16/21 1531     Subjective Pt present to tx without moderate stiffness in the L shoulder but no pain. Pt was able to complete yard work this weekend this some assist for lifting task. Pt has a scheduled MRI 03/20/21, with a f/u the first week november.    Pertinent History Pt's MRI results were available 11/27/2020 with mild joint changes. Pt reported abnormal EMG studies that was conducted 2-3 weeks ago. Pt is retired from an [employer] that required traveling. Hobbies include yards work and maintaining his home. Pt is R hand dominate. Pt stated moderate aggravation with current information that was made available to him during his current treatment. Pt requested transparency with all test and measures as well as prognosis and progression.    Diagnostic tests MRI and  EMG    Patient Stated Goals " Get better"    Currently in Pain? No/denies    Pain Score 0-No pain    Pain Onset More than a month ago                  Treatment:   Therapeutic Exercise:   NuStep L3 with verbal cueing to facilitate maximal LUE workload with contral UE and LE assist. Pt stated decrease in L GH pain and more muscle loading compared to arm bike.    Supine shoulder flexion at [facility] 20 deg, 15 deg, and 10 degs. Pt was only able to complete full ROM at [facility] of 10 degs and quickly fatigue resulting in compensatory activation to complete task.        Manual Therapy: 16 mins   Position supine  STM at neutral to L bicep ,  tricep, pectoralis minor/major upper trap, and deltoid.  -alternating inferior scapular mobs bouts between STM 30 second bouts. Grade mod 2-3         PT Long Term Goals - 03/03/21 1003       PT LONG TERM GOAL #1   Title Pt will improve FOTO score to predicted value of 59 to improve independence with ADLs    Baseline 25 9/13: Deferred    Time 8    Period Weeks    Status Not Met    Target Date 04/27/21      PT LONG TERM GOAL #2   Title Pt will improve L shoulder strengt tested via MMT Flex/abd to 2/5 to promote increase LUE use during grooming and feeding.    Baseline 0/5 08/10: MMT R Shoulder   Flexion: 2-/5  Abduction: 2-/5  in supine 9/12:  MMT R Shoulder   Flexion: 2-/5  Abduction: 2-/5  in supine *pt is able to complete more ROM, however does not have the full ROM 2/2 poor strength    Time 8    Period Weeks    Status Partially Met    Target Date 04/27/21      PT LONG TERM GOAL #3   Title Pt will improve L PROM equal to R to maintain glenohumeral joint health for future functional strength gains.    Baseline L flex105  Abd 68  R flexion/abd 110; 08/10 L shoulder: AROM L shoulder: deg.  Sitting  Flex: 0  Abd: 0  Supine:  Flex: 29  Abd: 20  ER: 10     PROM L shoulder: deg.  Supine:  Flex: 109  Abd: 101  ER: 26 9/12: PROM LSupine:  Flex: 93  Abd: 67  ER: 26    Time 8    Period Weeks    Status Partially Met    Target Date 04/27/21      PT LONG TERM GOAL #4   Title Pt reports ability to don/doff pants and shirts MI without spouse assist to improve dressing ADL's.    Baseline Spouse current Mod-Max assist during dressing. 9/12: Pt is currently min ass during dressing    Time 8    Period Weeks    Status Partially Met    Target Date 04/27/21                   Plan - 03/16/21 1534     Clinical Impression Statement Today's tx was focused on introduction of NuStep to promote UE/LE strength at low shoulder elevation. Pt was able to complete shoulder flexion ROM at 10  degs of HOB, however, unable to complete full ROM at 15 and 20 degs of HOB. Pt continues to display mark UE endurance, strength, and ROM deficits resulting in decreased safe home/community mobiity, increased pain, and limited access to QOL. Pt. will continue to benefit from skilled physical therapy to progress POC to address remaining deficits to facilitate maximum functional capacity for optimal personal health and wellness for ADLs.    Personal Factors and Comorbidities Age;Comorbidity 3+;Time since onset of injury/illness/exacerbation    Examination-Activity Limitations Bed Mobility;Carry;Lift;Dressing;Reach Overhead;Self Feeding;Transfers    Examination-Participation Restrictions Yard Work    Stability/Clinical Decision Making Unstable/Unpredictable    Clinical Decision Making High    Rehab Potential Fair    PT Frequency 2x / week    PT Duration 8 weeks    PT Treatment/Interventions ADLs/Self Care Home Management;Aquatic Therapy;Biofeedback;Cryotherapy;Electrical Stimulation;Moist Heat;Traction;Ultrasound;Fluidtherapy;Contrast Bath;Gait training;Stair training;Functional mobility training;Therapeutic activities;Therapeutic exercise;Balance training;Neuromuscular re-education;Patient/family education;Manual techniques;Passive range of motion;Dry needling;Energy conservation;Splinting;Taping;Joint Manipulations    PT Next Visit Plan Check MRI schedule date.  Progress HEP    PT Home Exercise Plan Supine shoulder flexion x30 morning and night.    Consulted and Agree with Plan of Care Patient             Patient will benefit from skilled therapeutic intervention in order to improve the following deficits and impairments:  Decreased activity tolerance, Decreased coordination, Decreased strength, Impaired UE functional use, Pain, Postural dysfunction, Improper body mechanics, Decreased range of motion, Decreased endurance, Decreased mobility, Decreased skin integrity, Hypomobility, Impaired  sensation  Visit Diagnosis: Shoulder weakness  Abnormal posture  Pain of left upper extremity     Problem List Patient Active Problem List   Diagnosis Date Noted   Shingles 10/12/2020   Shoulder pain, left 10/12/2020   AKI (acute kidney injury) ([place]) 10/20/2019   Dilatation of thoracic aorta ([place]) 10/19/2019   Pedal edema 07/11/2019   Daytime somnolence 06/02/2019   Coronary artery disease involving native coronary artery of native heart without angina pectoris 02/08/2019   Chronic HFrEF (heart failure with reduced ejection fraction) ([place]) 02/08/2019   S/P aortic valve replacement 02/08/2019   Postoperative anemia 02/08/2019   S/P CABG x 3 40/98/1191   Acute systolic congestive heart failure ([place]) 12/21/2018   Biventricular failure ([place]) 12/21/2018   Nonrheumatic aortic valve stenosis 12/21/2018   SOB (shortness of breath) 12/11/2018   Hypoxia 12/11/2018   Shortness of breath 12/11/2018   Right shoulder pain 05/08/2015   Health care maintenance 11/12/2014   BMI 32.0-32.9,adult 06/18/2014   Elevated PSA 02/17/2014   CKD (chronic kidney disease) stage 3, GFR 30-59 ml/min (HCC) 05/21/2012   Gout 05/21/2012   Hypertension 05/02/2012   Hyperlipidemia LDL goal <70 05/02/2012   Diabetes ([place]) 05/02/2012   Pura Spice, PT, DPT # [id number] Fara Olden, SPT 03/17/2021, 8:00 AM  [facility] [address] [place], [place], [address] Phone: [phone]   Fax:  [phone]  Name: REON HUNLEY MRN: [id number] Date of Birth: 12/22/1930

## 2021-03-18 ENCOUNTER — Encounter: Payer: Medicare HMO | Admitting: Physical Therapy

## 2021-03-20 ENCOUNTER — Ambulatory Visit
Admission: RE | Admit: 2021-03-20 | Discharge: 2021-03-20 | Disposition: A | Discharge status: Home / Self Care | Payer: Medicare HMO | Source: Ambulatory Visit | Attending: Orthopedic Surgery | Admitting: Orthopedic Surgery

## 2021-03-20 ENCOUNTER — Other Ambulatory Visit: Payer: Self-pay

## 2021-03-20 ENCOUNTER — Ambulatory Visit: Payer: Medicare HMO

## 2021-03-20 DIAGNOSIS — M25512 Pain in left shoulder: Secondary | ICD-10-CM | POA: Insufficient documentation

## 2021-03-20 DIAGNOSIS — G8929 Other chronic pain: Secondary | ICD-10-CM | POA: Insufficient documentation

## 2021-03-20 DIAGNOSIS — M75112 Incomplete rotator cuff tear or rupture of left shoulder, not specified as traumatic: Secondary | ICD-10-CM | POA: Diagnosis not present

## 2021-03-20 DIAGNOSIS — M19012 Primary osteoarthritis, left shoulder: Secondary | ICD-10-CM | POA: Diagnosis not present

## 2021-03-20 DIAGNOSIS — M25312 Other instability, left shoulder: Secondary | ICD-10-CM | POA: Insufficient documentation

## 2021-03-20 DIAGNOSIS — R600 Localized edema: Secondary | ICD-10-CM | POA: Diagnosis not present

## 2021-03-23 ENCOUNTER — Encounter: Payer: Medicare HMO | Admitting: Physical Therapy

## 2021-03-25 ENCOUNTER — Encounter: Payer: Medicare HMO | Admitting: Physical Therapy

## 2021-03-26 ENCOUNTER — Encounter: Payer: Medicare HMO | Admitting: Physical Therapy

## 2021-03-30 ENCOUNTER — Encounter: Payer: Medicare HMO | Admitting: Physical Therapy

## 2021-03-31 ENCOUNTER — Other Ambulatory Visit: Payer: Self-pay

## 2021-03-31 ENCOUNTER — Ambulatory Visit: Payer: Medicare HMO | Attending: Neurology

## 2021-03-31 ENCOUNTER — Encounter: Payer: Medicare HMO | Admitting: Physical Therapy

## 2021-03-31 DIAGNOSIS — R29898 Other symptoms and signs involving the musculoskeletal system: Secondary | ICD-10-CM

## 2021-03-31 DIAGNOSIS — M79602 Pain in left arm: Secondary | ICD-10-CM | POA: Diagnosis not present

## 2021-03-31 DIAGNOSIS — R293 Abnormal posture: Secondary | ICD-10-CM | POA: Diagnosis not present

## 2021-03-31 NOTE — Therapy (Signed)
[facility] [address] [place], [place], [address] Phone: [phone]   Fax:  [phone]  Physical Therapy Treatment  Patient Details  Name: [name] [name] MRN: [id number] Date of Birth: [birth year] Referring Provider (PT): Ray Church, MD   Encounter Date: 03/31/2021   PT End of Session - 03/31/21 1525     Visit Number 22    Number of Visits 33    Date for PT Re-Evaluation 04/27/21    Authorization - Visit Number 2    Authorization - Number of Visits 10    Progress Note Due on Visit 10    PT Start Time 0229    PT Stop Time 0316    PT Time Calculation (min) 47 min    Equipment Utilized During Treatment Other (comment)    Activity Tolerance Patient tolerated treatment well;Patient limited by pain    Behavior During Therapy [facility] for tasks assessed/performed             Past Medical History:  Diagnosis Date   Aortic stenosis    Arthritis    right shoulder   BPH (benign prostatic hypertrophy)    Coronary artery disease    Gout    Heart murmur    History of chicken pox    Hypercholesterolemia    Hypertension    Systolic heart failure ([place])    ischemic cardiomyopathy   Wears dentures    full upper and lower    Past Surgical History:  Procedure Laterality Date   AORTIC VALVE REPLACEMENT N/A 01/25/2019   Procedure: AORTIC VALVE REPLACEMENT (AVR) 49mm Inspiris valve;  Surgeon: Gaye Pollack, MD;  [facility];  Service: Open Heart Surgery;  Laterality: N/A;   arm surgery     right arm fx s/p "plate insertion"   CARDIAC CATHETERIZATION     CATARACT EXTRACTION W/PHACO Right 11/17/2016   Procedure: CATARACT EXTRACTION PHACO AND INTRAOCULAR LENS PLACEMENT ([place])  Right;  Surgeon: Leandrew Koyanagi, MD;  Location: [place];  Service: Ophthalmology;  Laterality: Right;   CORONARY ARTERY BYPASS GRAFT N/A 01/25/2019   LIMA-LAD and sequential SVG-rPDA-rPL   DENTAL SURGERY     all teeth  extracted   RIGHT HEART CATH AND CORONARY ANGIOGRAPHY N/A 01/02/2019   Procedure: RIGHT HEART CATH AND CORONARY ANGIOGRAPHY;  Surgeon: Nelva Bush, MD;  Location: [place] [facility];  Service: Cardiovascular;  Laterality: N/A;   TEE WITHOUT CARDIOVERSION N/A 01/25/2019   Procedure: TRANSESOPHAGEAL ECHOCARDIOGRAM (TEE);  Surgeon: Gaye Pollack, MD;  Location: [place];  Service: Open Heart Surgery;  Laterality: N/A;    There were no vitals filed for this visit.   Subjective Assessment - 03/31/21 1441     Subjective Pt present to tx with updates from orthopedic office. Pt states that shoulder MRI displayed severe OA without rotator cuff or other muscular tear. Pt would like to continue PT through current POC until 04/27/2021. Pt states no increase pain in the L shoulder with occ "zinger" pain traveling down the lateral arm. Pt states poor adherence to HEP 2/2 increase access to functional activities around the house and property.    Pertinent History Pt's MRI results were available 11/27/2020 with mild joint changes. Pt reported abnormal EMG studies that was conducted 2-3 weeks ago. Pt is retired from an [employer] job that required traveling. Hobbies include yards work and maintaining his home. Pt is R hand dominate. Pt stated moderate aggravation with current information that was made  available to him during his current treatment. Pt requested transparency with all test and measures as well as prognosis and progression.    Diagnostic tests MRI and EMG    Patient Stated Goals " Get better"    Currently in Pain? No/denies    Pain Score 0-No pain    Pain Onset More than a month ago                Treatment:   Therapeutic Exercise:   NuStep L4 for 10 min with verbal cueing to facilitate maximal LUE workload with contral UE and LE assist. Dicussed current POC, MRI findings, and pt goals with future tx.   R shoulder flexion along stair rail with wash cloth x30. Verbal and contact cueing to  facilitate optimal tissue loading and correct biomechanical alignment to ensure efficient and safe movement.    Supine shoulder flexion in supine 5x10 with intermittent manual intervention between sets. See below. Pt was able to complete great ROM with greater ease post manual intervention. Pt states no pain with movement.      Manual Therapy: 16 mins   Position supine Intermittent bouts of manual intervention between therex sets: L post [place] mobs grade 2, 30 sec bouts x3  with STM to proximal bicep between bouts.           PT Long Term Goals - 03/03/21 1003       PT LONG TERM GOAL #1   Title Pt will improve FOTO score to predicted value of 59 to improve independence with ADLs    Baseline 25 9/13: Deferred    Time 8    Period Weeks    Status Not Met    Target Date 04/27/21      PT LONG TERM GOAL #2   Title Pt will improve L shoulder strengt tested via MMT Flex/abd to 2/5 to promote increase LUE use during grooming and feeding.    Baseline 0/5 08/10: MMT R Shoulder   Flexion: 2-/5  Abduction: 2-/5  in supine 9/12:  MMT R Shoulder   Flexion: 2-/5  Abduction: 2-/5  in supine *pt is able to complete more ROM, however does not have the full ROM 2/2 poor strength    Time 8    Period Weeks    Status Partially Met    Target Date 04/27/21      PT LONG TERM GOAL #3   Title Pt will improve L PROM equal to R to maintain glenohumeral joint health for future functional strength gains.    Baseline L flex105  Abd 68  R flexion/abd 110; 08/10 L shoulder: AROM L shoulder: deg.  Sitting  Flex: 0  Abd: 0  Supine:  Flex: 29  Abd: 20  ER: 10     PROM L shoulder: deg.  Supine:  Flex: 109  Abd: 101  ER: 26 9/12: PROM LSupine:  Flex: 93  Abd: 67  ER: 26    Time 8    Period Weeks    Status Partially Met    Target Date 04/27/21      PT LONG TERM GOAL #4   Title Pt reports ability to don/doff pants and shirts MI without spouse assist to improve dressing ADL's.    Baseline Spouse current Mod-Max  assist during dressing. 9/12: Pt is currently min ass during dressing    Time 8    Period Weeks    Status Partially Met    Target Date 04/27/21  Plan - 03/31/21 1526     Clinical Impression Statement Today's tx was focused on R shoulder flexion in supine with intermittent manual intervention for great access to ROM. Pt continues to display poor L UE control with positive sulcus sign that is ISQ with current POC. Pt continues to display mark  L UE endurance, strength, and ROM deficits resulting in decreased safe home/community mobiity, increased pain, and limited access to QOL. Pt. will continue to benefit from skilled physical therapy to progress POC to address remaining deficits to facilitate maximum functional capacity for optimal personal health and wellness for ADLs.    Personal Factors and Comorbidities Age;Comorbidity 3+;Time since onset of injury/illness/exacerbation    Examination-Activity Limitations Bed Mobility;Carry;Lift;Dressing;Reach Overhead;Self Feeding;Transfers    Examination-Participation Restrictions Yard Work    Stability/Clinical Decision Making Unstable/Unpredictable    Clinical Decision Making High    Rehab Potential Fair    PT Frequency 2x / week    PT Duration 8 weeks    PT Treatment/Interventions ADLs/Self Care Home Management;Aquatic Therapy;Biofeedback;Cryotherapy;Electrical Stimulation;Moist Heat;Traction;Ultrasound;Fluidtherapy;Contrast Bath;Gait training;Stair training;Functional mobility training;Therapeutic activities;Therapeutic exercise;Balance training;Neuromuscular re-education;Patient/family education;Manual techniques;Passive range of motion;Dry needling;Energy conservation;Splinting;Taping;Joint Manipulations    PT Next Visit Plan Progress HEP    PT Home Exercise Plan Supine shoulder flexion x30 morning and night.    Consulted and Agree with Plan of Care Patient             Patient will benefit from skilled therapeutic  intervention in order to improve the following deficits and impairments:  Decreased activity tolerance, Decreased coordination, Decreased strength, Impaired UE functional use, Pain, Postural dysfunction, Improper body mechanics, Decreased range of motion, Decreased endurance, Decreased mobility, Decreased skin integrity, Hypomobility, Impaired sensation  Visit Diagnosis: Shoulder weakness  Abnormal posture  Pain of left upper extremity     Problem List Patient Active Problem List   Diagnosis Date Noted   Shingles 10/12/2020   Shoulder pain, left 10/12/2020   AKI (acute kidney injury) ([place]) 10/20/2019   Dilatation of thoracic aorta ([place]) 10/19/2019   Pedal edema 07/11/2019   Daytime somnolence 06/02/2019   Coronary artery disease involving native coronary artery of native heart without angina pectoris 02/08/2019   Chronic HFrEF (heart failure with reduced ejection fraction) ([place]) 02/08/2019   S/P aortic valve replacement 02/08/2019   Postoperative anemia 02/08/2019   S/P CABG x 3 84/16/6063   Acute systolic congestive heart failure ([place]) 12/21/2018   Biventricular failure ([place]) 12/21/2018   Nonrheumatic aortic valve stenosis 12/21/2018   SOB (shortness of breath) 12/11/2018   Hypoxia 12/11/2018   Shortness of breath 12/11/2018   Right shoulder pain 05/08/2015   Health care maintenance 11/12/2014   BMI 32.0-32.9,adult 06/18/2014   Elevated PSA 02/17/2014   CKD (chronic kidney disease) stage 3, GFR 30-59 ml/min (HCC) 05/21/2012   Gout 05/21/2012   Hypertension 05/02/2012   Hyperlipidemia LDL goal <70 05/02/2012   Diabetes ([place]) 05/02/2012   Fara Olden, SPT  Sal[place] Caster. Fairly IV, PT, DPT Physical Therapist- [facility]  03/31/2021, 3:35 PM  [facility] [address] [place], [place], [address] Phone: [phone]   Fax:  [phone]  Name: [name] [name] MRN: [id number] Date of  Birth: 02-28-1931

## 2021-04-02 ENCOUNTER — Other Ambulatory Visit: Payer: Self-pay

## 2021-04-02 ENCOUNTER — Encounter: Payer: Medicare HMO | Admitting: Physical Therapy

## 2021-04-02 ENCOUNTER — Ambulatory Visit: Payer: Medicare HMO

## 2021-04-02 DIAGNOSIS — R29898 Other symptoms and signs involving the musculoskeletal system: Secondary | ICD-10-CM | POA: Diagnosis not present

## 2021-04-02 DIAGNOSIS — R293 Abnormal posture: Secondary | ICD-10-CM | POA: Diagnosis not present

## 2021-04-02 DIAGNOSIS — M79602 Pain in left arm: Secondary | ICD-10-CM | POA: Diagnosis not present

## 2021-04-02 NOTE — Therapy (Signed)
[facility] [address] [place] , [place], [address] Phone: [phone]   Fax:  [phone]  Physical Therapy Treatment  Patient Details  Name: [name] [name] MRN: [id number] Date of Birth: 07/10/1930 Referring Provider (PT): Ray Church, MD   Encounter Date: 04/02/2021   PT End of Session - 04/02/21 1522     Visit Number 23    Number of Visits 33    Date for PT Re-Evaluation 04/27/21    Authorization - Visit Number 3    Authorization - Number of Visits 10    Progress Note Due on Visit 10    PT Start Time 6962    PT Stop Time 1516    PT Time Calculation (min) 45 min    Equipment Utilized During Treatment Other (comment)    Activity Tolerance Patient tolerated treatment well;Patient limited by pain    Behavior During Therapy [facility] for tasks assessed/performed             Past Medical History:  Diagnosis Date   Aortic stenosis    Arthritis    right shoulder   BPH (benign prostatic hypertrophy)    Coronary artery disease    Gout    Heart murmur    History of chicken pox    Hypercholesterolemia    Hypertension    Systolic heart failure ([place])    ischemic cardiomyopathy   Wears dentures    full upper and lower    Past Surgical History:  Procedure Laterality Date   AORTIC VALVE REPLACEMENT N/A 01/25/2019   Procedure: AORTIC VALVE REPLACEMENT (AVR) 44mm Inspiris valve;  Surgeon: Gaye Pollack, MD;  [facility];  Service: Open Heart Surgery;  Laterality: N/A;   arm surgery     right arm fx s/p "plate insertion"   CARDIAC CATHETERIZATION     CATARACT EXTRACTION W/PHACO Right 11/17/2016   Procedure: CATARACT EXTRACTION PHACO AND INTRAOCULAR LENS PLACEMENT ([place])  Right;  Surgeon: Leandrew Koyanagi, MD;  Location: [place];  Service: Ophthalmology;  Laterality: Right;   CORONARY ARTERY BYPASS GRAFT N/A 01/25/2019   LIMA-LAD and sequential SVG-rPDA-rPL   DENTAL SURGERY     all teeth  extracted   RIGHT HEART CATH AND CORONARY ANGIOGRAPHY N/A 01/02/2019   Procedure: RIGHT HEART CATH AND CORONARY ANGIOGRAPHY;  Surgeon: Nelva Bush, MD;  Location: [place] [facility];  Service: Cardiovascular;  Laterality: N/A;   TEE WITHOUT CARDIOVERSION N/A 01/25/2019   Procedure: TRANSESOPHAGEAL ECHOCARDIOGRAM (TEE);  Surgeon: Gaye Pollack, MD;  Location: [place];  Service: Open Heart Surgery;  Laterality: N/A;    There were no vitals filed for this visit.   Subjective Assessment - 04/02/21 1431     Subjective Pt presents to tx with 4/10 R lateral deltoid pain/soreness at the start of tx. Pt states that his shoulder felt excellent after last tx. Pt continues to states minor return to function with ADLs.    Pertinent History Pt's MRI results were available 11/27/2020 with mild joint changes. Pt reported abnormal EMG studies that was conducted 2-3 weeks ago. Pt is retired from an [employer] job that required traveling. Hobbies include yards work and maintaining his home. Pt is R hand dominate. Pt stated moderate aggravation with current information that was made available to him during his current treatment. Pt requested transparency with all test and measures as well as prognosis and progression.    Diagnostic tests MRI and EMG    Patient Stated Goals "  Get better"    Currently in Pain? Yes    Pain Score 4     Pain Location Arm    Pain Orientation Upper;Lateral    Pain Descriptors / Indicators Aching    Pain Onset More than a month ago             Treatment:   Therapeutic Exercise:   NuStep L4 for 10 min with verbal cueing to facilitate maximal LUE workload with contral UE and LE assist. Discussed soreness after previous tx and possible causes of increase lateral deltoid soreness.  Finger ladder 2x5 with close CGA/SBA to ensure the pt did not drop the LUE. Verbal cueing to facilitate optimal shoulder muscle activation.   1# ABCs at 90 deg shoulder flexion CGA/SBA to ensure safety  of movement.   Single LUE shoulder row 3x10. Verbal and contact cueing to facilitate optimal tissue loading and correct biomechanical alignment to ensure efficient and safe movement.    Supine shoulder flexion in supine 8x10 with intermittent manual intervention between sets. Sets 1-4 completed without weight, sets 4-6 completed with 0.5#, and sets 6-8 with 1#.  See below. Pt was able to complete great ROM with greater ease post manual intervention. Pt states no pain with movement.      Manual Therapy: 16 mins   Position supine Intermittent bouts of manual intervention between therex sets: L post [place] mobs grade 2, 30 sec bouts x3  with STM to proximal bicep between bouts.     PT Education - 04/02/21 1522     Education Details PT was educated on correct movement during shoulder therex to protect the laxity of the L GH joint.    Person(s) Educated Patient    Methods Explanation;Tactile cues;Verbal cues    Comprehension Verbalized understanding;Returned demonstration                 PT Long Term Goals - 03/03/21 1003       PT LONG TERM GOAL #1   Title Pt will improve FOTO score to predicted value of 59 to improve independence with ADLs    Baseline 25 9/13: Deferred    Time 8    Period Weeks    Status Not Met    Target Date 04/27/21      PT LONG TERM GOAL #2   Title Pt will improve L shoulder strengt tested via MMT Flex/abd to 2/5 to promote increase LUE use during grooming and feeding.    Baseline 0/5 08/10: MMT R Shoulder   Flexion: 2-/5  Abduction: 2-/5  in supine 9/12:  MMT R Shoulder   Flexion: 2-/5  Abduction: 2-/5  in supine *pt is able to complete more ROM, however does not have the full ROM 2/2 poor strength    Time 8    Period Weeks    Status Partially Met    Target Date 04/27/21      PT LONG TERM GOAL #3   Title Pt will improve L PROM equal to R to maintain glenohumeral joint health for future functional strength gains.    Baseline L flex105  Abd 68  R  flexion/abd 110; 08/10 L shoulder: AROM L shoulder: deg.  Sitting  Flex: 0  Abd: 0  Supine:  Flex: 29  Abd: 20  ER: 10     PROM L shoulder: deg.  Supine:  Flex: 109  Abd: 101  ER: 26 9/12: PROM LSupine:  Flex: 93  Abd: 67  ER: 26  Time 8    Period Weeks    Status Partially Met    Target Date 04/27/21      PT LONG TERM GOAL #4   Title Pt reports ability to don/doff pants and shirts MI without spouse assist to improve dressing ADL's.    Baseline Spouse current Mod-Max assist during dressing. 9/12: Pt is currently min ass during dressing    Time 8    Period Weeks    Status Partially Met    Target Date 04/27/21                   Plan - 04/02/21 1523     Clinical Impression Statement Today's tx was focused on increasing LUE strengthening with resisted shoulder flexion and ABC's for RTC strengthening. Pt tolerated tx well without increase in L shoulder pain. CGA/SBA is needed at this time during POC to ensure no rapid dropping movement of the LUE during fatiguing mobility. Pt continues to display mark LUE endurance, strength, and ROM deficits resulting in decreased safe home/community mobiity, increased pain, and limited access to QOL. Pt. will continue to benefit from skilled physical therapy to progress POC to address remaining deficits to facilitate maximum functional capacity for optimal personal health and wellness for ADLs.    Personal Factors and Comorbidities Age;Comorbidity 3+;Time since onset of injury/illness/exacerbation    Examination-Activity Limitations Bed Mobility;Carry;Lift;Dressing;Reach Overhead;Self Feeding;Transfers    Examination-Participation Restrictions Yard Work    Stability/Clinical Decision Making Unstable/Unpredictable    Clinical Decision Making High    Rehab Potential Fair    PT Frequency 2x / week    PT Duration 8 weeks    PT Treatment/Interventions ADLs/Self Care Home Management;Aquatic Therapy;Biofeedback;Cryotherapy;Electrical Stimulation;Moist  Heat;Traction;Ultrasound;Fluidtherapy;Contrast Bath;Gait training;Stair training;Functional mobility training;Therapeutic activities;Therapeutic exercise;Balance training;Neuromuscular re-education;Patient/family education;Manual techniques;Passive range of motion;Dry needling;Energy conservation;Splinting;Taping;Joint Manipulations    PT Next Visit Plan Reassess soreness after previous tx.    PT Home Exercise Plan Supine shoulder flexion x30 morning and night.    Consulted and Agree with Plan of Care Patient             Patient will benefit from skilled therapeutic intervention in order to improve the following deficits and impairments:  Decreased activity tolerance, Decreased coordination, Decreased strength, Impaired UE functional use, Pain, Postural dysfunction, Improper body mechanics, Decreased range of motion, Decreased endurance, Decreased mobility, Decreased skin integrity, Hypomobility, Impaired sensation  Visit Diagnosis: Shoulder weakness  Abnormal posture  Pain of left upper extremity     Problem List Patient Active Problem List   Diagnosis Date Noted   Shingles 10/12/2020   Shoulder pain, left 10/12/2020   AKI (acute kidney injury) ([place]) 10/20/2019   Dilatation of thoracic aorta ([place]) 10/19/2019   Pedal edema 07/11/2019   Daytime somnolence 06/02/2019   Coronary artery disease involving native coronary artery of native heart without angina pectoris 02/08/2019   Chronic HFrEF (heart failure with reduced ejection fraction) ([place]) 02/08/2019   S/P aortic valve replacement 02/08/2019   Postoperative anemia 02/08/2019   S/P CABG x 3 08/67/6195   Acute systolic congestive heart failure ([place]) 12/21/2018   Biventricular failure ([place]) 12/21/2018   Nonrheumatic aortic valve stenosis 12/21/2018   SOB (shortness of breath) 12/11/2018   Hypoxia 12/11/2018   Shortness of breath 12/11/2018   Right shoulder pain 05/08/2015   Health care maintenance 11/12/2014   BMI  32.0-32.9,adult 06/18/2014   Elevated PSA 02/17/2014   CKD (chronic kidney disease) stage 3, GFR 30-59 ml/min (HCC) 05/21/2012   Gout 05/21/2012  Hypertension 05/02/2012   Hyperlipidemia LDL goal <70 05/02/2012   Diabetes ([place]) 05/02/2012   Fara Olden, SPT  Sal[place] Caster. Fairly IV, PT, DPT Physical Therapist- [facility]  04/02/2021, 3:47 PM  [facility] [address] [place], [place], [address] Phone: [phone]   Fax:  [phone]  Name: [name] [name] MRN: [id number] Date of Birth: Jun 05, 1931

## 2021-04-06 ENCOUNTER — Encounter: Payer: Medicare HMO | Admitting: Physical Therapy

## 2021-04-06 DIAGNOSIS — E538 Deficiency of other specified B group vitamins: Secondary | ICD-10-CM | POA: Diagnosis not present

## 2021-04-07 ENCOUNTER — Encounter: Payer: Medicare HMO | Admitting: Physical Therapy

## 2021-04-07 ENCOUNTER — Ambulatory Visit: Payer: Medicare HMO

## 2021-04-07 ENCOUNTER — Other Ambulatory Visit: Payer: Self-pay

## 2021-04-07 DIAGNOSIS — R293 Abnormal posture: Secondary | ICD-10-CM

## 2021-04-07 DIAGNOSIS — R29898 Other symptoms and signs involving the musculoskeletal system: Secondary | ICD-10-CM

## 2021-04-07 DIAGNOSIS — M79602 Pain in left arm: Secondary | ICD-10-CM

## 2021-04-07 NOTE — Therapy (Signed)
[facility] [address] [place], [place], [address] Phone: [phone]   Fax:  [phone]  Physical Therapy Treatment  Patient Details  Name: [name] [name] MRN: [id number] Date of Birth: Sep 04, 1930 Referring Provider (PT): Ray Church, MD   Encounter Date: 04/07/2021   PT End of Session - 04/07/21 1522     Visit Number 24    Number of Visits 33    Date for PT Re-Evaluation 04/27/21    Authorization - Visit Number 4    Authorization - Number of Visits 10    Progress Note Due on Visit 10    PT Start Time 3235    PT Stop Time 1516    PT Time Calculation (min) 45 min    Equipment Utilized During Treatment Other (comment)    Activity Tolerance Patient tolerated treatment well;Patient limited by pain    Behavior During Therapy [facility] for tasks assessed/performed             Past Medical History:  Diagnosis Date   Aortic stenosis    Arthritis    right shoulder   BPH (benign prostatic hypertrophy)    Coronary artery disease    Gout    Heart murmur    History of chicken pox    Hypercholesterolemia    Hypertension    Systolic heart failure ([place])    ischemic cardiomyopathy   Wears dentures    full upper and lower    Past Surgical History:  Procedure Laterality Date   AORTIC VALVE REPLACEMENT N/A 01/25/2019   Procedure: AORTIC VALVE REPLACEMENT (AVR) 44m Inspiris valve;  Surgeon: BGaye Pollack MD;  [facility]: [facility];  Service: Open Heart Surgery;  Laterality: N/A;   arm surgery     right arm fx s/p "plate insertion"   CARDIAC CATHETERIZATION     CATARACT EXTRACTION W/PHACO Right 11/17/2016   Procedure: CATARACT EXTRACTION PHACO AND INTRAOCULAR LENS PLACEMENT ([place]  Right;  Surgeon: BLeandrew Koyanagi MD;  [facility]  Service: Ophthalmology;  Laterality: Right;   CORONARY ARTERY BYPASS GRAFT N/A 01/25/2019   LIMA-LAD and sequential SVG-rPDA-rPL   DENTAL SURGERY     all teeth  extracted   RIGHT HEART CATH AND CORONARY ANGIOGRAPHY N/A 01/02/2019   Procedure: RIGHT HEART CATH AND CORONARY ANGIOGRAPHY;  Surgeon: ENelva Bush MD;  [facility];  Service: Cardiovascular;  Laterality: N/A;   TEE WITHOUT CARDIOVERSION N/A 01/25/2019   Procedure: TRANSESOPHAGEAL ECHOCARDIOGRAM (TEE);  Surgeon: BGaye Pollack MD;  [facility]  Service: Open Heart Surgery;  Laterality: N/A;    There were no vitals filed for this visit.   Subjective Assessment - 04/07/21 1519     Subjective Pt present to tx with sx ISQ with minor improvment in L shoulder AROM with hand to face ADLs. Pt reports no pain at the start of tx.    Pertinent History Pt's MRI results were available 11/27/2020 with mild joint changes. Pt reported abnormal EMG studies that was conducted 2-3 weeks ago. Pt is retired from an [employer] that required traveling. Hobbies include yards work and maintaining his home. Pt is R hand dominate. Pt stated moderate aggravation with current information that was made available to him during his current treatment. Pt requested transparency with all test and measures as well as prognosis and progression.    Diagnostic tests MRI and EMG    Patient Stated Goals " Get better"    Currently  in Pain? No/denies    Pain Score 0-No pain    Pain Onset More than a month ago               Treatment:   Therapeutic Exercise:    Supine shoulder flexion in supine 8x10 with 1# ankle weight with intermittent manual intervention between sets. Pt was able to complete great ROM with greater ease post manual intervention. Pt states no pain with movement.    Chest press with weighted dowel x30. Verbal and contact cueing to facilitate optimal tissue loading and correct biomechanical alignment to ensure efficient and safe movement.    Shoulder flexion with YTB isometric holds 3x10. SBA to ensure that the LUE did not undergo rapid unsafe movement 2/2 poor stability in the L  GHJ.   Supine Shoulder abd with light CGA to ensure that the shoulder did not fall into horizontal abduction beyond neutral.      Manual Therapy: 16 mins   Position supine Intermittent bouts of manual intervention between therex sets: L post GH mobs grade 2, 30 sec bouts x3  with STM to L pectoralis.           PT Education - 04/07/21 1521     Education Details Pt was educated on proper form and technique with addition of new therex.    Person(s) Educated Patient    Methods Explanation;Demonstration;Tactile cues;Verbal cues    Comprehension Verbalized understanding;Returned demonstration;Tactile cues required;Verbal cues required                 PT Long Term Goals - 03/03/21 1003       PT LONG TERM GOAL #1   Title Pt will improve FOTO score to predicted value of 59 to improve independence with ADLs    Baseline 25 9/13: Deferred    Time 8    Period Weeks    Status Not Met    Target Date 04/27/21      PT LONG TERM GOAL #2   Title Pt will improve L shoulder strengt tested via MMT Flex/abd to 2/5 to promote increase LUE use during grooming and feeding.    Baseline 0/5 08/10: MMT R Shoulder   Flexion: 2-/5  Abduction: 2-/5  in supine 9/12:  MMT R Shoulder   Flexion: 2-/5  Abduction: 2-/5  in supine *pt is able to complete more ROM, however does not have the full ROM 2/2 poor strength    Time 8    Period Weeks    Status Partially Met    Target Date 04/27/21      PT LONG TERM GOAL #3   Title Pt will improve L PROM equal to R to maintain glenohumeral joint health for future functional strength gains.    Baseline L flex105  Abd 68  R flexion/abd 110; 08/10 L shoulder: AROM L shoulder: deg.  Sitting  Flex: 0  Abd: 0  Supine:  Flex: 29  Abd: 20  ER: 10     PROM L shoulder: deg.  Supine:  Flex: 109  Abd: 101  ER: 26 9/12: PROM LSupine:  Flex: 93  Abd: 67  ER: 26    Time 8    Period Weeks    Status Partially Met    Target Date 04/27/21      PT LONG TERM GOAL #4   Title  Pt reports ability to don/doff pants and shirts MI without spouse assist to improve dressing ADL's.    Baseline Spouse current Mod-Max assist  during dressing. 9/12: Pt is currently min ass during dressing    Time 8    Period Weeks    Status Partially Met    Target Date 04/27/21                   Plan - 04/07/21 1522     Clinical Impression Statement Pt tolerated tx well with addition of UE exercises in supine. Pt was able to complete AROM L shoulder abduction in supine for the first time with CGA to ensure no unsafe rapid movement. Pt states reduction in L shoulder stiffness and greater adherence to therex with intermittent manual intervention. Pt continues to display mark LUE endurance, strength, and ROM deficits resulting in decreased safe home/community mobiity, increased pain, and limited access to QOL. Pt. will continue to benefit from skilled physical therapy to progress POC to address remaining deficits to facilitate maximum functional capacity for optimal personal health and wellness for ADLs.    Personal Factors and Comorbidities Age;Comorbidity 3+;Time since onset of injury/illness/exacerbation    Examination-Activity Limitations Bed Mobility;Carry;Lift;Dressing;Reach Overhead;Self Feeding;Transfers    Examination-Participation Restrictions Yard Work    Stability/Clinical Decision Making Unstable/Unpredictable    Clinical Decision Making High    Rehab Potential Fair    PT Frequency 2x / week    PT Duration 8 weeks    PT Treatment/Interventions ADLs/Self Care Home Management;Aquatic Therapy;Biofeedback;Cryotherapy;Electrical Stimulation;Moist Heat;Traction;Ultrasound;Fluidtherapy;Contrast Bath;Gait training;Stair training;Functional mobility training;Therapeutic activities;Therapeutic exercise;Balance training;Neuromuscular re-education;Patient/family education;Manual techniques;Passive range of motion;Dry needling;Energy conservation;Splinting;Taping;Joint Manipulations    PT  Next Visit Plan Reassess soreness after previous tx.    PT Home Exercise Plan Supine shoulder flexion x30 morning and night.    Consulted and Agree with Plan of Care Patient             Patient will benefit from skilled therapeutic intervention in order to improve the following deficits and impairments:  Decreased activity tolerance, Decreased coordination, Decreased strength, Impaired UE functional use, Pain, Postural dysfunction, Improper body mechanics, Decreased range of motion, Decreased endurance, Decreased mobility, Decreased skin integrity, Hypomobility, Impaired sensation  Visit Diagnosis: Shoulder weakness  Abnormal posture  Pain of left upper extremity     Problem List Patient Active Problem List   Diagnosis Date Noted   Shingles 10/12/2020   Shoulder pain, left 10/12/2020   AKI (acute kidney injury) ([place]) 10/20/2019   Dilatation of thoracic aorta ([place]) 10/19/2019   Pedal edema 07/11/2019   Daytime somnolence 06/02/2019   Coronary artery disease involving native coronary artery of native heart without angina pectoris 02/08/2019   Chronic HFrEF (heart failure with reduced ejection fraction) ([place]) 02/08/2019   S/P aortic valve replacement 02/08/2019   Postoperative anemia 02/08/2019   S/P CABG x 3 99/83/3825   Acute systolic congestive heart failure ([place]) 12/21/2018   Biventricular failure ([place]) 12/21/2018   Nonrheumatic aortic valve stenosis 12/21/2018   SOB (shortness of breath) 12/11/2018   Hypoxia 12/11/2018   Shortness of breath 12/11/2018   Right shoulder pain 05/08/2015   Health care maintenance 11/12/2014   BMI 32.0-32.9,adult 06/18/2014   Elevated PSA 02/17/2014   CKD (chronic kidney disease) stage 3, GFR 30-59 ml/min (HCC) 05/21/2012   Gout 05/21/2012   Hypertension 05/02/2012   Hyperlipidemia LDL goal <70 05/02/2012   Diabetes ([place]) 05/02/2012   Fara Olden, SPT  [place] Caster. Fairly IV, PT, DPT Physical Therapist- [facility]  04/07/2021, 3:36 PM  [facility] [address]  [place], [place], [address] Phone: [phone]   Fax:  [phone]  Name: [name] [name] MRN: [id number] Date of Birth: 08/01/1930

## 2021-04-08 ENCOUNTER — Encounter: Payer: Medicare HMO | Admitting: Physical Therapy

## 2021-04-09 ENCOUNTER — Other Ambulatory Visit: Payer: Self-pay

## 2021-04-09 ENCOUNTER — Ambulatory Visit (INDEPENDENT_AMBULATORY_CARE_PROVIDER_SITE_OTHER): Payer: Medicare HMO | Admitting: Internal Medicine

## 2021-04-09 ENCOUNTER — Encounter: Payer: Medicare HMO | Admitting: Physical Therapy

## 2021-04-09 DIAGNOSIS — M25512 Pain in left shoulder: Secondary | ICD-10-CM | POA: Diagnosis not present

## 2021-04-09 DIAGNOSIS — I5022 Chronic systolic (congestive) heart failure: Secondary | ICD-10-CM | POA: Diagnosis not present

## 2021-04-09 DIAGNOSIS — I350 Nonrheumatic aortic (valve) stenosis: Secondary | ICD-10-CM

## 2021-04-09 DIAGNOSIS — N1831 Chronic kidney disease, stage 3a: Secondary | ICD-10-CM | POA: Diagnosis not present

## 2021-04-09 DIAGNOSIS — I10 Essential (primary) hypertension: Secondary | ICD-10-CM

## 2021-04-09 DIAGNOSIS — I77810 Thoracic aortic ectasia: Secondary | ICD-10-CM | POA: Diagnosis not present

## 2021-04-09 DIAGNOSIS — I2510 Atherosclerotic heart disease of native coronary artery without angina pectoris: Secondary | ICD-10-CM

## 2021-04-09 DIAGNOSIS — Z23 Encounter for immunization: Secondary | ICD-10-CM | POA: Diagnosis not present

## 2021-04-09 DIAGNOSIS — E1159 Type 2 diabetes mellitus with other circulatory complications: Secondary | ICD-10-CM

## 2021-04-09 DIAGNOSIS — E785 Hyperlipidemia, unspecified: Secondary | ICD-10-CM

## 2021-04-09 NOTE — Progress Notes (Signed)
Patient ID: [name] [name], male   DOB: 05-16-1931, 87 y.o.   MRN: [id number]   Subjective:    Patient ID: [name] [name], male    DOB: [birth year], 87 y.o.   MRN: [id number]  This visit occurred during the SARS-CoV-2 public health emergency.  Safety protocols were in place, including screening questions prior to the visit, additional usage of staff PPE, and extensive cleaning of exam room while observing appropriate contact time as indicated for disinfecting solutions.   Patient here for a scheduled follow up.   Chief Complaint  Patient presents with   Hypertension   Hyperlipidemia   .   HPI He reports he is doing relatively well.  Persistent pain and limited rom - shoulder.  Taking gabapentin.  Working with therapy.  Frustrating.  No chest pain reported.  Breathing stable.  No acid reflux or abdominal pain reported.  No bowel change.  Not checking his blood pressure.    Past Medical History:  Diagnosis Date   Aortic stenosis    Arthritis    right shoulder   BPH (benign prostatic hypertrophy)    Coronary artery disease    Gout    Heart murmur    History of chicken pox    Hypercholesterolemia    Hypertension    Systolic heart failure (HCC)    ischemic cardiomyopathy   Wears dentures    full upper and lower   Past Surgical History:  Procedure Laterality Date   AORTIC VALVE REPLACEMENT N/A 01/25/2019   Procedure: AORTIC VALVE REPLACEMENT (AVR) 62mm Inspiris valve;  Surgeon: Gaye Pollack, MD;  [facility];  Service: Open Heart Surgery;  Laterality: N/A;   arm surgery     right arm fx s/p "plate insertion"   CARDIAC CATHETERIZATION     CATARACT EXTRACTION W/PHACO Right 11/17/2016   Procedure: CATARACT EXTRACTION PHACO AND INTRAOCULAR LENS PLACEMENT ([place])  Right;  Surgeon: Leandrew Koyanagi, MD;  Location: [place];  Service: Ophthalmology;  Laterality: Right;   CORONARY ARTERY BYPASS GRAFT N/A 01/25/2019   LIMA-LAD and sequential SVG-rPDA-rPL   DENTAL  SURGERY     all teeth extracted   RIGHT HEART CATH AND CORONARY ANGIOGRAPHY N/A 01/02/2019   Procedure: RIGHT HEART CATH AND CORONARY ANGIOGRAPHY;  Surgeon: Nelva Bush, MD;  Location: [place] [facility];  Service: Cardiovascular;  Laterality: N/A;   TEE WITHOUT CARDIOVERSION N/A 01/25/2019   Procedure: TRANSESOPHAGEAL ECHOCARDIOGRAM (TEE);  Surgeon: Gaye Pollack, MD;  Location: [place];  Service: Open Heart Surgery;  Laterality: N/A;   Family History  Problem Relation Age of Onset   Leukemia Father    Congestive Heart Failure Mother    Cancer Daughter        Breast Cancer   Prostate cancer Neg Hx    Colon cancer Neg Hx    Social History   Socioeconomic History   Marital status: Married    Spouse name: Not on file   Number of children: Not on file   Years of education: Not on file   Highest education level: Not on file  Occupational History   Not on file  Tobacco Use   Smoking status: Never   Smokeless tobacco: Current    Types: Chew   Tobacco comments:    not ready to quit currently  Vaping Use   Vaping Use: Never used  Substance and Sexual Activity   Alcohol use: No    Alcohol/week: 0.0 standard drinks   Drug use: No  Sexual activity: Not Currently  Other Topics Concern   Not on file  Social History Narrative   Not on file   Social Determinants of Health   Financial Resource Strain: Low Risk    Difficulty of Paying Living Expenses: Not hard at all  Food Insecurity: No Food Insecurity   Worried About [place] in the Last Year: Never true   [place] in the Last Year: Never true  Transportation Needs: No Transportation Needs   Lack of Transportation (Medical): No   Lack of Transportation (Non-Medical): No  Physical Activity: Not on file  Stress: No Stress Concern Present   Feeling of Stress : Not at all  Social Connections: Unknown   Frequency of Communication with Friends and Family: Not on file   Frequency of Social Gatherings with  Friends and Family: Not on file   Attends Religious Services: Not on file   Active Member of Clubs or Organizations: Not on file   Attends Archivist Meetings: Not on file   Marital Status: Married    Review of Systems  Constitutional:  Negative for appetite change and unexpected weight change.  HENT:  Negative for congestion and sinus pressure.   Respiratory:  Negative for cough and chest tightness.        Breathing stable.  No increased sob.    Cardiovascular:  Negative for chest pain and palpitations.  Gastrointestinal:  Negative for abdominal pain, diarrhea, nausea and vomiting.  Genitourinary:  Negative for difficulty urinating and dysuria.  Musculoskeletal:  Negative for joint swelling and myalgias.  Skin:  Negative for color change and rash.  Neurological:  Negative for dizziness, light-headedness and headaches.  Psychiatric/Behavioral:  Negative for agitation and dysphoric mood.       Objective:     BP 136/78   Pulse 66   Temp 97.6 F (36.4 C)   Resp 16   Ht 5\' 10"  (1.778 m)   Wt 241 lb (109.3 kg)   SpO2 98%   BMI 34.58 kg/m  Wt Readings from Last 3 Encounters:  04/09/21 241 lb (109.3 kg)  03/20/21 225 lb (102.1 kg)  01/28/21 225 lb (102.1 kg)    Physical Exam Vitals reviewed.  Constitutional:      General: He is not in acute distress.    Appearance: Normal appearance. He is well-developed.  HENT:     Head: Normocephalic and atraumatic.     Right Ear: External ear normal.     Left Ear: External ear normal.  Eyes:     General: No scleral icterus.       Right eye: No discharge.        Left eye: No discharge.     Conjunctiva/sclera: Conjunctivae normal.  Cardiovascular:     Rate and Rhythm: Normal rate and regular rhythm.  Pulmonary:     Effort: Pulmonary effort is normal. No respiratory distress.     Breath sounds: Normal breath sounds.  Abdominal:     General: Bowel sounds are normal.     Palpations: Abdomen is soft.     Tenderness: There  is no abdominal tenderness.  Musculoskeletal:        General: No swelling or tenderness.     Cervical back: Neck supple. No tenderness.  Lymphadenopathy:     Cervical: No cervical adenopathy.  Skin:    Findings: No erythema or rash.  Neurological:     Mental Status: He is alert.  Psychiatric:  Mood and Affect: Mood normal.        Behavior: Behavior normal.     Outpatient Encounter Medications as of 04/09/2021  Medication Sig   allopurinol (ZYLOPRIM) 100 MG tablet TAKE 1 TABLET EVERY DAY   amLODipine (NORVASC) 5 MG tablet TAKE 1 TABLET EVERY DAY   aspirin EC 81 MG tablet Take 1 tablet (81 mg total) by mouth daily.   atorvastatin (LIPITOR) 80 MG tablet TAKE 1 TABLET EVERY MORNING   carvedilol (COREG) 6.25 MG tablet TAKE 1 TABLET TWICE DAILY WITH A MEAL   cyanocobalamin (,VITAMIN B-12,) 1000 MCG/ML injection Inject 1,000 mcg into the muscle every 30 (thirty) days.   ergocalciferol (VITAMIN D2) 1.25 MG (50000 UT) capsule Take 1 capsule by mouth once a week.   gabapentin (NEURONTIN) 300 MG capsule Take 300 mg by mouth at bedtime.   LORazepam (ATIVAN) 1 MG tablet Takes 1/2 to 1 tablet q day prn   losartan (COZAAR) 100 MG tablet TAKE 1 TABLET (100 MG TOTAL) BY MOUTH DAILY.   [DISCONTINUED] doxycycline (VIBRAMYCIN) 100 MG capsule Take 1 capsule (100 mg total) by mouth 2 (two) times daily.   No facility-administered encounter medications on file as of 04/09/2021.     Lab Results  Component Value Date   WBC 9.6 05/23/2020   HGB 13.2 05/23/2020   HCT 39.4 05/23/2020   PLT 166.0 05/23/2020   GLUCOSE 90 10/16/2020   CHOL 78 10/16/2020   TRIG 57.0 10/16/2020   HDL 24.20 (L) 10/16/2020   LDLCALC 42 10/16/2020   ALT 25 10/16/2020   AST 26 10/16/2020   NA 138 10/16/2020   K 3.5 10/31/2020   CL 100 10/16/2020   CREATININE 1.05 10/16/2020   BUN 16 10/16/2020   CO2 28 10/16/2020   TSH 4.91 (H) 10/16/2020   PSA 4.15 (H) 02/08/2014   INR 1.7 (H) 01/25/2019   HGBA1C 6.6 (H)  10/16/2020   MICROALBUR 46.0 (H) 12/05/2019    MR SHOULDER LEFT WO CONTRAST  Result Date: 03/22/2021 CLINICAL DATA:  Limited range of motion of the left arm since having shingles. No injury or prior surgery. EXAM: MRI OF THE LEFT SHOULDER WITHOUT CONTRAST TECHNIQUE: Multiplanar, multisequence MR imaging of the shoulder was performed. No intravenous contrast was administered. COMPARISON:  None. FINDINGS: Rotator cuff: Mild supraspinatus tendinosis with low-grade partial-thickness bursal surface tear anteriorly at the insertion. The infraspinatus, teres minor, and subscapularis tendons are intact. Muscles: Mild diffuse muscle edema involving the supraspinatus, infraspinatus, teres minor, and deltoid muscles. Mild diffuse rotator cuff muscle fatty infiltration without frank atrophy. Biceps long head:  Intact and normally positioned. Acromioclavicular Joint: Mild arthropathy of the acromioclavicular joint. Type I acromion. Trace subacromial/subdeltoid bursal fluid. Glenohumeral Joint: Small joint effusion. Extensive full-thickness cartilage loss over the humeral head and glenoid with subchondral marrow edema and cystic change. Labrum:  Diffusely degenerated. Bones: No acute fracture or dislocation. No suspicious bone lesion. Other: None. IMPRESSION: 1. Mild diffuse muscle edema involving the supraspinatus, infraspinatus, teres minor, and deltoid muscles. Findings are suggestive of acute denervation (brachial neuritis). 2. Mild supraspinatus tendinosis with low-grade partial-thickness bursal surface tear anteriorly at the insertion. 3. 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 Electronically Signed   By: Titus Dubin M.D.   On: 03/22/2021 10:45       Assessment & Plan:   Problem List Items Addressed This Visit     Chronic HFrEF (heart failure with reduced ejection fraction) (HCC)    Continue losartan and coreg.  No evidence of  volume overload on exam.  Follow.       CKD (chronic  kidney disease) stage 3, GFR 30-59 ml/min (HCC)    Avoid antiinflammatories.  Stay hydrated.  Follow metabolic panel.  Continue losartan.       Coronary artery disease involving native coronary artery of native heart without angina pectoris    Continue coreg, losartan and lipitor.  No chest pain.  Continue risk factor modification.       Diabetes ([place])    On no medication.  Aware that steroids can elevate his sugar.  Stay hydrated.  Monitor sugars.      Relevant Orders   Basic metabolic panel   Hemoglobin A1c   Microalbumin / creatinine urine ratio   Dilatation of thoracic aorta (HCC)    S/p surgery.  Continues to be followed by cardiology - monitoring.       Hyperlipidemia LDL goal <70    Continue lipitor.  Low cholesterol diet and exercise.  Follow lipid panel and liver function tests.       Relevant Orders   CBC with Differential/Platelet   Hepatic function panel   Lipid panel   TSH   Hypertension    Continue losartan, amlodipine and Coreg.  Follow blood pressure. Elevated on today's check.  Hold on making changes.  May need to adjust medication.  Follow pressures.  Follow metabolic panel.       Nonrheumatic aortic valve stenosis    Continue f/u with cardiology.       Shoulder pain, left    Has seen ortho.  Has seen neurology.  Persistent pain and limited rom. Going to PT.  Follow.       Other Visit Diagnoses     Need for immunization against influenza       Relevant Orders   Flu Vaccine QUAD High Dose(Fluad) (Completed)        Einar Pheasant, MD

## 2021-04-11 ENCOUNTER — Encounter: Payer: Self-pay | Admitting: Internal Medicine

## 2021-04-11 NOTE — Assessment & Plan Note (Signed)
Avoid antiinflammatories.  Stay hydrated.  Follow metabolic panel. Continue losartan.  

## 2021-04-11 NOTE — Assessment & Plan Note (Signed)
Continue losartan, amlodipine and Coreg.  Follow blood pressure. Elevated on today's check.  Hold on making changes.  May need to adjust medication.  Follow pressures.  Follow metabolic panel.

## 2021-04-11 NOTE — Assessment & Plan Note (Signed)
Continue losartan and coreg.  No evidence of volume overload on exam.  Follow.

## 2021-04-11 NOTE — Assessment & Plan Note (Signed)
Continue lipitor.  Low cholesterol diet and exercise.  Follow lipid panel and liver function tests.   

## 2021-04-11 NOTE — Assessment & Plan Note (Signed)
Continue f/u with cardiology.  

## 2021-04-11 NOTE — Assessment & Plan Note (Signed)
On no medication.  Aware that steroids can elevate his sugar.  Stay hydrated.  Monitor sugars.

## 2021-04-11 NOTE — Assessment & Plan Note (Signed)
Has seen ortho.  Has seen neurology.  Persistent pain and limited rom. Going to PT.  Follow.

## 2021-04-11 NOTE — Assessment & Plan Note (Signed)
S/p surgery.  Continues to be followed by cardiology - monitoring.  

## 2021-04-11 NOTE — Assessment & Plan Note (Signed)
Continue coreg, losartan and lipitor.  No chest pain.  Continue risk factor modification.  

## 2021-04-13 ENCOUNTER — Encounter: Payer: Medicare HMO | Admitting: Physical Therapy

## 2021-04-14 ENCOUNTER — Ambulatory Visit: Payer: Medicare HMO

## 2021-04-14 ENCOUNTER — Encounter: Payer: Medicare HMO | Admitting: Physical Therapy

## 2021-04-14 ENCOUNTER — Encounter: Payer: Self-pay | Admitting: Physical Therapy

## 2021-04-14 ENCOUNTER — Other Ambulatory Visit: Payer: Self-pay

## 2021-04-14 DIAGNOSIS — R29898 Other symptoms and signs involving the musculoskeletal system: Secondary | ICD-10-CM

## 2021-04-14 DIAGNOSIS — M79602 Pain in left arm: Secondary | ICD-10-CM

## 2021-04-14 DIAGNOSIS — R293 Abnormal posture: Secondary | ICD-10-CM

## 2021-04-14 NOTE — Therapy (Addendum)
[facility] [address]. [place], [place], [address] Phone: [phone]   Fax:  [phone]  Physical Therapy Treatment  Patient Details  Name: [name] [name] MRN: [id number] Date of Birth: [birth year] Referring Provider (PT): Ray Church, MD   Encounter Date: 04/14/2021   PT End of Session - 04/14/21 1525     Visit Number 25    Number of Visits 33    Date for PT Re-Evaluation 04/27/21    Authorization - Visit Number 5    Authorization - Number of Visits 10    Progress Note Due on Visit 10    PT Start Time 0940    PT Stop Time 1601    PT Time Calculation (min) 47 min    Equipment Utilized During Treatment Other (comment)    Activity Tolerance Patient tolerated treatment well;Patient limited by pain    Behavior During Therapy [facility] for tasks assessed/performed             Past Medical History:  Diagnosis Date   Aortic stenosis    Arthritis    right shoulder   BPH (benign prostatic hypertrophy)    Coronary artery disease    Gout    Heart murmur    History of chicken pox    Hypercholesterolemia    Hypertension    Systolic heart failure ([place])    ischemic cardiomyopathy   Wears dentures    full upper and lower    Past Surgical History:  Procedure Laterality Date   AORTIC VALVE REPLACEMENT N/A 01/25/2019   Procedure: AORTIC VALVE REPLACEMENT (AVR) 11mm Inspiris valve;  Surgeon: Gaye Pollack, MD;  [facility] OR;  Service: Open Heart Surgery;  Laterality: N/A;   arm surgery     right arm fx s/p "plate insertion"   CARDIAC CATHETERIZATION     CATARACT EXTRACTION W/PHACO Right 11/17/2016   Procedure: CATARACT EXTRACTION PHACO AND INTRAOCULAR LENS PLACEMENT ([place])  Right;  Surgeon: Leandrew Koyanagi, MD;  Location: [place];  Service: Ophthalmology;  Laterality: Right;   CORONARY ARTERY BYPASS GRAFT N/A 01/25/2019   LIMA-LAD and sequential SVG-rPDA-rPL   DENTAL SURGERY     all teeth  extracted   RIGHT HEART CATH AND CORONARY ANGIOGRAPHY N/A 01/02/2019   Procedure: RIGHT HEART CATH AND CORONARY ANGIOGRAPHY;  Surgeon: Nelva Bush, MD;  Location: [place] CV LAB;  Service: Cardiovascular;  Laterality: N/A;   TEE WITHOUT CARDIOVERSION N/A 01/25/2019   Procedure: TRANSESOPHAGEAL ECHOCARDIOGRAM (TEE);  Surgeon: Gaye Pollack, MD;  Location: [place];  Service: Open Heart Surgery;  Laterality: N/A;    There were no vitals filed for this visit.   Subjective Assessment - 04/14/21 1520     Subjective Pt presents to tx with slight increase in L shoulder/ arm soreness s/p yard amounts of yard work over the weekend. Pt also presents with  2'' superficial laceration over the R lateral forearm. 2/10 L shoulder NPS at the start of tx.    Pertinent History Pt's MRI results were available 11/27/2020 with mild joint changes. Pt reported abnormal EMG studies that was conducted 2-3 weeks ago. Pt is retired from an [employer] job that required traveling. Hobbies include yards work and maintaining his home. Pt is R hand dominate. Pt stated moderate aggravation with current information that was made available to him during his current treatment. Pt requested transparency with all test and measures as well as prognosis and progression.    Diagnostic tests MRI  and EMG    Patient Stated Goals " Get better"    Currently in Pain? Yes    Pain Score 2     Pain Location Shoulder    Pain Orientation Left    Pain Descriptors / Indicators Aching    Pain Type Neuropathic pain    Pain Onset More than a month ago                Treatment:   Therapeutic Exercise:    Supine shoulder flexion in supine 5x10 with 1# dumbbell with intermittent manual intervention between sets. Pt was able to complete great ROM with greater ease post manual intervention. Pt states no pain with movement.     Single arm Chest press with 1# weight 3x10 and 3# weight 3x8. Verbal and contact cueing to facilitate optimal  tissue loading and correct biomechanical alignment to ensure efficient and safe movement.     PT resisted shoulder ext from 90 deg of shoulder flexion to 30 deg of shoulder flexion to 3x10 to facilitate greater GH stability through post shoulder muscles.   Supine Shoulder abd with light CGA to ensure that the shoulder did not fall into horizontal abduction beyond neutral.      Manual Therapy: 10 mins   Position supine Intermittent bouts of manual intervention between therex sets: L post GH mobs grade 2, 30 sec bouts x3  with STM to L pectoralis.          PT Education - 04/14/21 1523     Education Details Pt was educated on safety of lifting and movement with L shoulder when completing high level ADLs.    Person(s) Educated Patient    Methods Explanation;Demonstration    Comprehension Verbalized understanding                 PT Long Term Goals - 03/03/21 1003       PT LONG TERM GOAL #1   Title Pt will improve FOTO score to predicted value of 59 to improve independence with ADLs    Baseline 25 9/13: Deferred    Time 8    Period Weeks    Status Not Met    Target Date 04/27/21      PT LONG TERM GOAL #2   Title Pt will improve L shoulder strengt tested via MMT Flex/abd to 2/5 to promote increase LUE use during grooming and feeding.    Baseline 0/5 08/10: MMT R Shoulder   Flexion: 2-/5  Abduction: 2-/5  in supine 9/12:  MMT R Shoulder   Flexion: 2-/5  Abduction: 2-/5  in supine *pt is able to complete more ROM, however does not have the full ROM 2/2 poor strength    Time 8    Period Weeks    Status Partially Met    Target Date 04/27/21      PT LONG TERM GOAL #3   Title Pt will improve L PROM equal to R to maintain glenohumeral joint health for future functional strength gains.    Baseline L flex105  Abd 68  R flexion/abd 110; 08/10 L shoulder: AROM L shoulder: deg.  Sitting  Flex: 0  Abd: 0  Supine:  Flex: 29  Abd: 20  ER: 10     PROM L shoulder: deg.  Supine:  Flex:  109  Abd: 101  ER: 26 9/12: PROM LSupine:  Flex: 93  Abd: 67  ER: 26    Time 8    Period Weeks  Status Partially Met    Target Date 04/27/21      PT LONG TERM GOAL #4   Title Pt reports ability to don/doff pants and shirts MI without spouse assist to improve dressing ADL's.    Baseline Spouse current Mod-Max assist during dressing. 9/12: Pt is currently min ass during dressing    Time 8    Period Weeks    Status Partially Met    Target Date 04/27/21                   Plan - 04/14/21 1526     Clinical Impression Statement Today's tx was focused on progression of UE therex to promote greater access to ADLs and protection of the L GH joint. Resistance was changed from ankle weight to dumbbell to facilitate grip strength during movement. Pt continues to tolerate greater mobility through therex with intermittent manual intervention. Pt continues to display mark LLE endurance, strength, and ROM deficits resulting in decreased safe home/community mobiity, increased pain, and limited access to QOL. Pt. will continue to benefit from skilled physical therapy to progress POC to address remaining deficits to facilitate maximum functional capacity for optimal personal health and wellness for ADLs.    Personal Factors and Comorbidities Age;Comorbidity 3+;Time since onset of injury/illness/exacerbation    Examination-Activity Limitations Bed Mobility;Carry;Lift;Dressing;Reach Overhead;Self Feeding;Transfers    Examination-Participation Restrictions Yard Work    Stability/Clinical Decision Making Unstable/Unpredictable    Clinical Decision Making High    Rehab Potential Fair    PT Frequency 2x / week    PT Duration 8 weeks    PT Treatment/Interventions ADLs/Self Care Home Management;Aquatic Therapy;Biofeedback;Cryotherapy;Electrical Stimulation;Moist Heat;Traction;Ultrasound;Fluidtherapy;Contrast Bath;Gait training;Stair training;Functional mobility training;Therapeutic activities;Therapeutic  exercise;Balance training;Neuromuscular re-education;Patient/family education;Manual techniques;Passive range of motion;Dry needling;Energy conservation;Splinting;Taping;Joint Manipulations    PT Next Visit Plan Reassess soreness after previous tx.    PT Home Exercise Plan Supine shoulder flexion x30 morning and night.    Consulted and Agree with Plan of Care Patient             Patient will benefit from skilled therapeutic intervention in order to improve the following deficits and impairments:  Decreased activity tolerance, Decreased coordination, Decreased strength, Impaired UE functional use, Pain, Postural dysfunction, Improper body mechanics, Decreased range of motion, Decreased endurance, Decreased mobility, Decreased skin integrity, Hypomobility, Impaired sensation  Visit Diagnosis: Shoulder weakness  Abnormal posture  Pain of left upper extremity     Problem List Patient Active Problem List   Diagnosis Date Noted   Shingles 10/12/2020   Shoulder pain, left 10/12/2020   AKI (acute kidney injury) ([place]) 10/20/2019   Dilatation of thoracic aorta ([place]) 10/19/2019   Pedal edema 07/11/2019   Daytime somnolence 06/02/2019   Coronary artery disease involving native coronary artery of native heart without angina pectoris 02/08/2019   Chronic HFrEF (heart failure with reduced ejection fraction) ([place]) 02/08/2019   S/P aortic valve replacement 02/08/2019   Postoperative anemia 02/08/2019   S/P CABG x 3 47/42/5956   Acute systolic congestive heart failure ([place]) 12/21/2018   Biventricular failure ([place]) 12/21/2018   Nonrheumatic aortic valve stenosis 12/21/2018   SOB (shortness of breath) 12/11/2018   Hypoxia 12/11/2018   Shortness of breath 12/11/2018   Right shoulder pain 05/08/2015   Health care maintenance 11/12/2014   BMI 32.0-32.9,adult 06/18/2014   Elevated PSA 02/17/2014   CKD (chronic kidney disease) stage 3, GFR 30-59 ml/min (HCC) 05/21/2012   Gout 05/21/2012    Hypertension 05/02/2012   Hyperlipidemia LDL goal <70  05/02/2012   Diabetes ([place]) 05/02/2012   Fara Olden, SPT  Salem Caster. Fairly IV, PT, DPT Physical Therapist- [facility]  04/14/2021, 8:50 PM  [facility] [address]. [place], [place], [address] Phone: [phone]   Fax:  [phone]  Name: BROWNING SOUTHWOOD MRN: [id number] Date of Birth: 26-Jul-1930

## 2021-04-15 ENCOUNTER — Encounter: Payer: Medicare HMO | Admitting: Physical Therapy

## 2021-04-16 ENCOUNTER — Other Ambulatory Visit: Payer: Self-pay

## 2021-04-16 ENCOUNTER — Ambulatory Visit: Payer: Medicare HMO | Admitting: Physical Therapy

## 2021-04-16 ENCOUNTER — Encounter: Payer: Medicare HMO | Admitting: Physical Therapy

## 2021-04-16 ENCOUNTER — Encounter: Payer: Self-pay | Admitting: Physical Therapy

## 2021-04-16 DIAGNOSIS — M79602 Pain in left arm: Secondary | ICD-10-CM

## 2021-04-16 DIAGNOSIS — R293 Abnormal posture: Secondary | ICD-10-CM | POA: Diagnosis not present

## 2021-04-16 DIAGNOSIS — R29898 Other symptoms and signs involving the musculoskeletal system: Secondary | ICD-10-CM | POA: Diagnosis not present

## 2021-04-16 NOTE — Therapy (Signed)
[facility] [address]. [place], [place], [address] Phone: [phone]   Fax:  [phone]  Physical Therapy Treatment  Patient Details  Name: [name] [name] MRN: [id number] Date of Birth: [birth year] Referring Provider (PT): Ray Church, MD   Encounter Date: 04/16/2021   PT End of Session - 04/16/21 1555     Visit Number 26    Number of Visits 33    Date for PT Re-Evaluation 04/27/21    Authorization - Visit Number 6    Authorization - Number of Visits 10    Progress Note Due on Visit 10    PT Start Time 1430    PT Stop Time 8466    PT Time Calculation (min) 46 min    Equipment Utilized During Treatment Other (comment)    Activity Tolerance Patient tolerated treatment well;Patient limited by pain    Behavior During Therapy [facility] for tasks assessed/performed             Past Medical History:  Diagnosis Date   Aortic stenosis    Arthritis    right shoulder   BPH (benign prostatic hypertrophy)    Coronary artery disease    Gout    Heart murmur    History of chicken pox    Hypercholesterolemia    Hypertension    Systolic heart failure ([place])    ischemic cardiomyopathy   Wears dentures    full upper and lower    Past Surgical History:  Procedure Laterality Date   AORTIC VALVE REPLACEMENT N/A 01/25/2019   Procedure: AORTIC VALVE REPLACEMENT (AVR) 12mm Inspiris valve;  Surgeon: Gaye Pollack, MD;  [facility];  Service: Open Heart Surgery;  Laterality: N/A;   arm surgery     right arm fx s/p "plate insertion"   CARDIAC CATHETERIZATION     CATARACT EXTRACTION W/PHACO Right 11/17/2016   Procedure: CATARACT EXTRACTION PHACO AND INTRAOCULAR LENS PLACEMENT ([place])  Right;  Surgeon: Leandrew Koyanagi, MD;  Location: [place];  Service: Ophthalmology;  Laterality: Right;   CORONARY ARTERY BYPASS GRAFT N/A 01/25/2019   LIMA-LAD and sequential SVG-rPDA-rPL   DENTAL SURGERY     all teeth  extracted   RIGHT HEART CATH AND CORONARY ANGIOGRAPHY N/A 01/02/2019   Procedure: RIGHT HEART CATH AND CORONARY ANGIOGRAPHY;  Surgeon: Nelva Bush, MD;  Location: [place] CV LAB;  Service: Cardiovascular;  Laterality: N/A;   TEE WITHOUT CARDIOVERSION N/A 01/25/2019   Procedure: TRANSESOPHAGEAL ECHOCARDIOGRAM (TEE);  Surgeon: Gaye Pollack, MD;  Location: [place];  Service: Open Heart Surgery;  Laterality: N/A;    There were no vitals filed for this visit.   Subjective Assessment - 04/16/21 1553     Subjective Pt present to treatment with reports of no increase in soreness, which surprise him 2/2 to increased work load the previous tx. Pt states no pain but moderate stiffness in the L shoulder at the start of tx.    Pertinent History Pt's MRI results were available 11/27/2020 with mild joint changes. Pt reported abnormal EMG studies that was conducted 2-3 weeks ago. Pt is retired from an [employer] job that required traveling. Hobbies include yards work and maintaining his home. Pt is R hand dominate. Pt stated moderate aggravation with current information that was made available to him during his current treatment. Pt requested transparency with all test and measures as well as prognosis and progression.    Diagnostic tests MRI and EMG  Patient Stated Goals " Get better"    Currently in Pain? No/denies    Pain Score 0-No pain    Pain Onset More than a month ago                Treatment:   Therapeutic Exercise:    Supine shoulder flexion in supine 5x5 with 3# dumbbell with intermittent manual intervention between sets. Pt was able to complete great ROM with greater ease post manual intervention. Pt states no pain with movement.     Single arm Chest press with  3# weight 3x15. Verbal and contact cueing to facilitate optimal tissue loading and correct biomechanical alignment to ensure efficient and safe movement.     PT resisted shoulder ext from 90 deg of shoulder flexion to  30 deg of shoulder flexion to 3x10 to facilitate greater GH stability through post shoulder muscles.   Supine Shoulder abd with light CGA to ensure that the shoulder did not fall into horizontal abduction beyond neutral.      Manual Therapy: 12 mins   Position supine Intermittent bouts of manual intervention between therex sets: L post GH mobs grade 2, 30 sec bouts x3  with STM to L pectoralis.            PT Long Term Goals - 03/03/21 1003       PT LONG TERM GOAL #1   Title Pt will improve FOTO score to predicted value of 59 to improve independence with ADLs    Baseline 25 9/13: Deferred    Time 8    Period Weeks    Status Not Met    Target Date 04/27/21      PT LONG TERM GOAL #2   Title Pt will improve L shoulder strengt tested via MMT Flex/abd to 2/5 to promote increase LUE use during grooming and feeding.    Baseline 0/5 08/10: MMT R Shoulder   Flexion: 2-/5  Abduction: 2-/5  in supine 9/12:  MMT R Shoulder   Flexion: 2-/5  Abduction: 2-/5  in supine *pt is able to complete more ROM, however does not have the full ROM 2/2 poor strength    Time 8    Period Weeks    Status Partially Met    Target Date 04/27/21      PT LONG TERM GOAL #3   Title Pt will improve L PROM equal to R to maintain glenohumeral joint health for future functional strength gains.    Baseline L flex105  Abd 68  R flexion/abd 110; 08/10 L shoulder: AROM L shoulder: deg.  Sitting  Flex: 0  Abd: 0  Supine:  Flex: 29  Abd: 20  ER: 10     PROM L shoulder: deg.  Supine:  Flex: 109  Abd: 101  ER: 26 9/12: PROM LSupine:  Flex: 93  Abd: 67  ER: 26    Time 8    Period Weeks    Status Partially Met    Target Date 04/27/21      PT LONG TERM GOAL #4   Title Pt reports ability to don/doff pants and shirts MI without spouse assist to improve dressing ADL's.    Baseline Spouse current Mod-Max assist during dressing. 9/12: Pt is currently min ass during dressing    Time 8    Period Weeks    Status Partially Met     Target Date 04/27/21  Plan - 04/16/21 1555     Clinical Impression Statement Today's tx was focused on progression of UE strengthening with 3# dumbbells. Pt was able to complete all tx without increase in pain. Pt continues to states greater activity tolerance with intermittent manual intervention for great GH alignment. Pt states no stiffness or pain post tx. Pt continues to display mark LLE endurance, strength, and ROM deficits resulting in decreased safe home/community mobiity, increased pain, and limited access to QOL. Pt. will continue to benefit from skilled physical therapy to progress POC to address remaining deficits to facilitate maximum functional capacity for optimal personal health and wellness for ADLs.    Personal Factors and Comorbidities Age;Comorbidity 3+;Time since onset of injury/illness/exacerbation    Examination-Activity Limitations Bed Mobility;Carry;Lift;Dressing;Reach Overhead;Self Feeding;Transfers    Examination-Participation Restrictions Yard Work    Stability/Clinical Decision Making Unstable/Unpredictable    Clinical Decision Making High    Rehab Potential Fair    PT Frequency 2x / week    PT Duration 8 weeks    PT Treatment/Interventions ADLs/Self Care Home Management;Aquatic Therapy;Biofeedback;Cryotherapy;Electrical Stimulation;Moist Heat;Traction;Ultrasound;Fluidtherapy;Contrast Bath;Gait training;Stair training;Functional mobility training;Therapeutic activities;Therapeutic exercise;Balance training;Neuromuscular re-education;Patient/family education;Manual techniques;Passive range of motion;Dry needling;Energy conservation;Splinting;Taping;Joint Manipulations    PT Next Visit Plan Reassess soreness after previous tx.    PT Home Exercise Plan Supine shoulder flexion x30 morning and night.    Consulted and Agree with Plan of Care Patient             Patient will benefit from skilled therapeutic intervention in order to improve  the following deficits and impairments:  Decreased activity tolerance, Decreased coordination, Decreased strength, Impaired UE functional use, Pain, Postural dysfunction, Improper body mechanics, Decreased range of motion, Decreased endurance, Decreased mobility, Decreased skin integrity, Hypomobility, Impaired sensation  Visit Diagnosis: Shoulder weakness  Abnormal posture  Pain of left upper extremity     Problem List Patient Active Problem List   Diagnosis Date Noted   Shingles 10/12/2020   Shoulder pain, left 10/12/2020   AKI (acute kidney injury) ([place]) 10/20/2019   Dilatation of thoracic aorta ([place]) 10/19/2019   Pedal edema 07/11/2019   Daytime somnolence 06/02/2019   Coronary artery disease involving native coronary artery of native heart without angina pectoris 02/08/2019   Chronic HFrEF (heart failure with reduced ejection fraction) ([place]) 02/08/2019   S/P aortic valve replacement 02/08/2019   Postoperative anemia 02/08/2019   S/P CABG x 3 70/06/7492   Acute systolic congestive heart failure ([place]) 12/21/2018   Biventricular failure ([place]) 12/21/2018   Nonrheumatic aortic valve stenosis 12/21/2018   SOB (shortness of breath) 12/11/2018   Hypoxia 12/11/2018   Shortness of breath 12/11/2018   Right shoulder pain 05/08/2015   Health care maintenance 11/12/2014   BMI 32.0-32.9,adult 06/18/2014   Elevated PSA 02/17/2014   CKD (chronic kidney disease) stage 3, GFR 30-59 ml/min (HCC) 05/21/2012   Gout 05/21/2012   Hypertension 05/02/2012   Hyperlipidemia LDL goal <70 05/02/2012   Diabetes ([place]) 05/02/2012    Patrina Levering PT, DPT  Fara Olden, Student-PT 04/16/2021, 4:00 PM  [place] [facility] [address]. [place], [place], [address] Phone: [phone]   Fax:  [phone]  Name: [name] [name] MRN: [id number] Date of Birth: 09/14/1930

## 2021-04-20 ENCOUNTER — Encounter: Payer: Medicare HMO | Admitting: Physical Therapy

## 2021-04-21 ENCOUNTER — Other Ambulatory Visit: Payer: Self-pay

## 2021-04-21 ENCOUNTER — Ambulatory Visit: Payer: Medicare HMO | Attending: Neurology

## 2021-04-21 ENCOUNTER — Encounter: Payer: Medicare HMO | Admitting: Physical Therapy

## 2021-04-21 DIAGNOSIS — M79602 Pain in left arm: Secondary | ICD-10-CM

## 2021-04-21 DIAGNOSIS — R293 Abnormal posture: Secondary | ICD-10-CM | POA: Diagnosis not present

## 2021-04-21 DIAGNOSIS — R29898 Other symptoms and signs involving the musculoskeletal system: Secondary | ICD-10-CM | POA: Diagnosis not present

## 2021-04-21 NOTE — Therapy (Signed)
[facility] [address]. [place], [place], [address] Phone: [phone]   Fax:  [phone]  Physical Therapy Treatment  Patient Details  Name: [name] [name] MRN: [id number] Date of Birth: 02-18-1931 Referring Provider (PT): Ray Church, MD   Encounter Date: 04/21/2021   PT End of Session - 04/21/21 1602     Visit Number 27    Number of Visits 33    Date for PT Re-Evaluation 04/27/21    Authorization - Visit Number 7    Authorization - Number of Visits 10    Progress Note Due on Visit 10    PT Start Time 8882    PT Stop Time 1515    PT Time Calculation (min) 42 min    Equipment Utilized During Treatment Other (comment)    Activity Tolerance Patient tolerated treatment well;Patient limited by pain    Behavior During Therapy [facility] for tasks assessed/performed             Past Medical History:  Diagnosis Date   Aortic stenosis    Arthritis    right shoulder   BPH (benign prostatic hypertrophy)    Coronary artery disease    Gout    Heart murmur    History of chicken pox    Hypercholesterolemia    Hypertension    Systolic heart failure ([place])    ischemic cardiomyopathy   Wears dentures    full upper and lower    Past Surgical History:  Procedure Laterality Date   AORTIC VALVE REPLACEMENT N/A 01/25/2019   Procedure: AORTIC VALVE REPLACEMENT (AVR) 16m Inspiris valve;  Surgeon: BGaye Pollack MD;  [facility]: [facility];  Service: Open Heart Surgery;  Laterality: N/A;   arm surgery     right arm fx s/p "plate insertion"   CARDIAC CATHETERIZATION     CATARACT EXTRACTION W/PHACO Right 11/17/2016   Procedure: CATARACT EXTRACTION PHACO AND INTRAOCULAR LENS PLACEMENT ([place]  Right;  Surgeon: BLeandrew Koyanagi MD;  [place]  Service: Ophthalmology;  Laterality: Right;   CORONARY ARTERY BYPASS GRAFT N/A 01/25/2019   LIMA-LAD and sequential SVG-rPDA-rPL   DENTAL SURGERY     all teeth  extracted   RIGHT HEART CATH AND CORONARY ANGIOGRAPHY N/A 01/02/2019   Procedure: RIGHT HEART CATH AND CORONARY ANGIOGRAPHY;  Surgeon: ENelva Bush MD;  [facility];  Service: Cardiovascular;  Laterality: N/A;   TEE WITHOUT CARDIOVERSION N/A 01/25/2019   Procedure: TRANSESOPHAGEAL ECHOCARDIOGRAM (TEE);  Surgeon: BGaye Pollack MD;  [place] [facility] [place]  Service: Open Heart Surgery;  Laterality: N/A;    There were no vitals filed for this visit.   Subjective Assessment - 04/21/21 1600     Subjective Pt reports to tx with sx ISQ from last treatment. Pt states a f/u with Dr. PRoland Racktomorrow to discuss MRI findings. PT states no pain at the start of tx.    Pertinent History Pt's MRI results were available 11/27/2020 with mild joint changes. Pt reported abnormal EMG studies that was conducted 2-3 weeks ago. Pt is retired from an [employer] that required traveling. Hobbies include yards work and maintaining his home. Pt is R hand dominate. Pt stated moderate aggravation with current information that was made available to him during his current treatment. Pt requested transparency with all test and measures as well as prognosis and progression.    Diagnostic tests MRI and EMG    Patient Stated Goals " Get better"  Currently in Pain? No/denies    Pain Score 0-No pain    Pain Onset More than a month ago              Treatment:   Therapeutic Exercise:    Stair hand Rail shoulder flexion slide with 2# cuff weight. Verbal and contact cueing to facilitate optimal tissue loading and correct biomechanical alignment to ensure efficient and safe movement.   Over head dexterity task at 68 inch above ground that was lowered to maximal high of achievement. Verbal and contact cueing to facilitate optimal tissue loading and correct biomechanical alignment to ensure efficient and safe movement.      Single arm Chest press with  3# weight 3x25. Verbal and contact cueing to facilitate  optimal tissue loading and correct biomechanical alignment to ensure efficient and safe movement.     Supine Shoulder abd 15 x3 with light CGA to ensure that the shoulder did not fall into horizontal abduction beyond neutral.      Manual Therapy: 13 mins   Position supine Intermittent bouts of manual intervention between therex sets: L post GH mobs grade 2, 30 sec bouts x3  with STM to L pectoralis.         PT Education - 04/21/21 1602     Education Details Pt was educated on form and technique with therex for optimal tissue loading    Person(s) Educated Patient    Methods Explanation;Demonstration;Tactile cues;Verbal cues    Comprehension Verbalized understanding;Returned demonstration                 PT Long Term Goals - 03/03/21 1003       PT LONG TERM GOAL #1   Title Pt will improve FOTO score to predicted value of 59 to improve independence with ADLs    Baseline 25 9/13: Deferred    Time 8    Period Weeks    Status Not Met    Target Date 04/27/21      PT LONG TERM GOAL #2   Title Pt will improve L shoulder strengt tested via MMT Flex/abd to 2/5 to promote increase LUE use during grooming and feeding.    Baseline 0/5 08/10: MMT R Shoulder   Flexion: 2-/5  Abduction: 2-/5  in supine 9/12:  MMT R Shoulder   Flexion: 2-/5  Abduction: 2-/5  in supine *pt is able to complete more ROM, however does not have the full ROM 2/2 poor strength    Time 8    Period Weeks    Status Partially Met    Target Date 04/27/21      PT LONG TERM GOAL #3   Title Pt will improve L PROM equal to R to maintain glenohumeral joint health for future functional strength gains.    Baseline L flex105  Abd 68  R flexion/abd 110; 08/10 L shoulder: AROM L shoulder: deg.  Sitting  Flex: 0  Abd: 0  Supine:  Flex: 29  Abd: 20  ER: 10     PROM L shoulder: deg.  Supine:  Flex: 109  Abd: 101  ER: 26 9/12: PROM LSupine:  Flex: 93  Abd: 67  ER: 26    Time 8    Period Weeks    Status Partially Met     Target Date 04/27/21      PT LONG TERM GOAL #4   Title Pt reports ability to don/doff pants and shirts MI without spouse assist to improve dressing ADL's.  Baseline Spouse current Mod-Max assist during dressing. 9/12: Pt is currently min ass during dressing    Time 8    Period Weeks    Status Partially Met    Target Date 04/27/21                   Plan - 04/21/21 1603     Clinical Impression Statement Today's tx was focused on progression of functional strengthening with overhead dexterity tasks and progression of UE therex resistance. Pt states no pain at the end of treatment and reduction in L shoulder stiffness. Pt continues to display mark LE endurance, strength, and ROM deficits resulting in decreased safe home/community mobiity, increased pain, and limited access to QOL. Pt. will continue to benefit from skilled physical therapy to progress POC to address remaining deficits to facilitate maximum functional capacity for optimal personal health and wellness for ADLs.    Personal Factors and Comorbidities Age;Comorbidity 3+;Time since onset of injury/illness/exacerbation    Examination-Activity Limitations Bed Mobility;Carry;Lift;Dressing;Reach Overhead;Self Feeding;Transfers    Examination-Participation Restrictions Yard Work    Stability/Clinical Decision Making Unstable/Unpredictable    Clinical Decision Making High    Rehab Potential Fair    PT Frequency 2x / week    PT Duration 8 weeks    PT Treatment/Interventions ADLs/Self Care Home Management;Aquatic Therapy;Biofeedback;Cryotherapy;Electrical Stimulation;Moist Heat;Traction;Ultrasound;Fluidtherapy;Contrast Bath;Gait training;Stair training;Functional mobility training;Therapeutic activities;Therapeutic exercise;Balance training;Neuromuscular re-education;Patient/family education;Manual techniques;Passive range of motion;Dry needling;Energy conservation;Splinting;Taping;Joint Manipulations    PT Next Visit Plan possible  progress of HEP.    PT Home Exercise Plan Supine shoulder flexion x30 morning and night.    Consulted and Agree with Plan of Care Patient             Patient will benefit from skilled therapeutic intervention in order to improve the following deficits and impairments:  Decreased activity tolerance, Decreased coordination, Decreased strength, Impaired UE functional use, Pain, Postural dysfunction, Improper body mechanics, Decreased range of motion, Decreased endurance, Decreased mobility, Decreased skin integrity, Hypomobility, Impaired sensation  Visit Diagnosis: Shoulder weakness  Abnormal posture  Pain of left upper extremity     Problem List Patient Active Problem List   Diagnosis Date Noted   Shingles 10/12/2020   Shoulder pain, left 10/12/2020   AKI (acute kidney injury) ([place]) 10/20/2019   Dilatation of thoracic aorta ([place]) 10/19/2019   Pedal edema 07/11/2019   Daytime somnolence 06/02/2019   Coronary artery disease involving native coronary artery of native heart without angina pectoris 02/08/2019   Chronic HFrEF (heart failure with reduced ejection fraction) ([place]) 02/08/2019   S/P aortic valve replacement 02/08/2019   Postoperative anemia 02/08/2019   S/P CABG x 3 75/03/2584   Acute systolic congestive heart failure ([place]) 12/21/2018   Biventricular failure ([place]) 12/21/2018   Nonrheumatic aortic valve stenosis 12/21/2018   SOB (shortness of breath) 12/11/2018   Hypoxia 12/11/2018   Shortness of breath 12/11/2018   Right shoulder pain 05/08/2015   Health care maintenance 11/12/2014   BMI 32.0-32.9,adult 06/18/2014   Elevated PSA 02/17/2014   CKD (chronic kidney disease) stage 3, GFR 30-59 ml/min (HCC) 05/21/2012   Gout 05/21/2012   Hypertension 05/02/2012   Hyperlipidemia LDL goal <70 05/02/2012   Diabetes ([place]) 05/02/2012   Fara Olden, SPT  [place] Caster. Fairly IV, PT, DPT Physical Therapist- [facility]  04/21/2021, 7:55  PM  [place] [facility] [address] [place], [place], [address] Phone: [phone]   Fax:  [phone]  Name:  [name] [name] MRN: [id number] Date of Birth: April 26, 1931

## 2021-04-22 ENCOUNTER — Encounter: Payer: Medicare HMO | Admitting: Physical Therapy

## 2021-04-22 DIAGNOSIS — M7582 Other shoulder lesions, left shoulder: Secondary | ICD-10-CM | POA: Diagnosis not present

## 2021-04-22 DIAGNOSIS — M19012 Primary osteoarthritis, left shoulder: Secondary | ICD-10-CM | POA: Diagnosis not present

## 2021-04-22 DIAGNOSIS — B0223 Postherpetic polyneuropathy: Secondary | ICD-10-CM | POA: Diagnosis not present

## 2021-04-23 ENCOUNTER — Encounter: Payer: Medicare HMO | Admitting: Physical Therapy

## 2021-04-27 ENCOUNTER — Encounter: Payer: Medicare HMO | Admitting: Physical Therapy

## 2021-04-27 ENCOUNTER — Other Ambulatory Visit: Payer: Self-pay | Admitting: Internal Medicine

## 2021-04-28 ENCOUNTER — Other Ambulatory Visit: Payer: Self-pay

## 2021-04-28 ENCOUNTER — Encounter: Payer: Self-pay | Admitting: Physical Therapy

## 2021-04-28 ENCOUNTER — Ambulatory Visit: Payer: Medicare HMO | Admitting: Physical Therapy

## 2021-04-28 DIAGNOSIS — M79602 Pain in left arm: Secondary | ICD-10-CM | POA: Diagnosis not present

## 2021-04-28 DIAGNOSIS — R293 Abnormal posture: Secondary | ICD-10-CM | POA: Diagnosis not present

## 2021-04-28 DIAGNOSIS — R29898 Other symptoms and signs involving the musculoskeletal system: Secondary | ICD-10-CM

## 2021-04-28 NOTE — Therapy (Signed)
[facility] [address]. [place], [place], [address] Phone: [phone]   Fax:  [phone]  Physical Therapy Treatment  Patient Details  Name: [name] [name] MRN: [id number] Date of Birth: [birth year] Referring Provider (PT): Ray Church, MD   Encounter Date: 04/28/2021   PT End of Session - 04/28/21 1432     Visit Number 28    Number of Visits 33    Date for PT Re-Evaluation 04/30/21    Authorization - Visit Number 8    Authorization - Number of Visits 10    Progress Note Due on Visit 10    PT Start Time 2671    PT Stop Time 1516    PT Time Calculation (min) 47 min    Equipment Utilized During Treatment Other (comment)    Activity Tolerance Patient tolerated treatment well;Patient limited by pain    Behavior During Therapy [facility] for tasks assessed/performed             Past Medical History:  Diagnosis Date   Aortic stenosis    Arthritis    right shoulder   BPH (benign prostatic hypertrophy)    Coronary artery disease    Gout    Heart murmur    History of chicken pox    Hypercholesterolemia    Hypertension    Systolic heart failure ([place])    ischemic cardiomyopathy   Wears dentures    full upper and lower    Past Surgical History:  Procedure Laterality Date   AORTIC VALVE REPLACEMENT N/A 01/25/2019   Procedure: AORTIC VALVE REPLACEMENT (AVR) 29m Inspiris valve;  Surgeon: BGaye Pollack MD;  [facility];  Service: Open Heart Surgery;  Laterality: N/A;   arm surgery     right arm fx s/p "plate insertion"   CARDIAC CATHETERIZATION     CATARACT EXTRACTION W/PHACO Right 11/17/2016   Procedure: CATARACT EXTRACTION PHACO AND INTRAOCULAR LENS PLACEMENT ([place]  Right;  Surgeon: BLeandrew Koyanagi MD;  [place]  Service: Ophthalmology;  Laterality: Right;   CORONARY ARTERY BYPASS GRAFT N/A 01/25/2019   LIMA-LAD and sequential SVG-rPDA-rPL   DENTAL SURGERY     all teeth  extracted   RIGHT HEART CATH AND CORONARY ANGIOGRAPHY N/A 01/02/2019   Procedure: RIGHT HEART CATH AND CORONARY ANGIOGRAPHY;  Surgeon: ENelva Bush MD;  [facility];  Service: Cardiovascular;  Laterality: N/A;   TEE WITHOUT CARDIOVERSION N/A 01/25/2019   Procedure: TRANSESOPHAGEAL ECHOCARDIOGRAM (TEE);  Surgeon: BGaye Pollack MD;  [place]: [facility]  Service: Open Heart Surgery;  Laterality: N/A;    There were no vitals filed for this visit.   Subjective Assessment - 04/28/21 1436     Subjective Pt present to tx with slight increase in L shoulder stiffness and soreness after working in the yard this weekend. Pt states that Dr. PRoland Rackstates no surgery, and continued PT as the most helpful POC at this time. Pt agrees that PT is the important POC at this time.    Pertinent History Pt's MRI results were available 11/27/2020 with mild joint changes. Pt reported abnormal EMG studies that was conducted 2-3 weeks ago. Pt is retired from an [employer] that required traveling. Hobbies include yards work and maintaining his home. Pt is R hand dominate. Pt stated moderate aggravation with current information that was made available to him during his current treatment. Pt requested transparency with all test and measures as well as prognosis and  progression.    Diagnostic tests MRI and EMG    Patient Stated Goals " Get better"    Currently in Pain? Yes    Pain Score 2     Pain Location Shoulder    Pain Orientation Left    Pain Descriptors / Indicators Aching    Pain Type Neuropathic pain    Pain Onset More than a month ago                Treatment:   Therapeutic Exercise:   Single arm Chest press with  4# weight 3x15. Verbal and contact cueing to facilitate optimal tissue loading and correct biomechanical alignment to ensure efficient and safe movement.   Supine Shoulder abd 15 x3 with light CGA to ensure that the shoulder did not fall into horizontal abduction beyond  neutral.   Single arm shoulder flexion to 90 with  2# weight 3x10. Verbal and contact cueing to facilitate optimal tissue loading and correct biomechanical alignment to ensure efficient and safe movement.   Shoulder 90 rhythmic stabilization 30 sec x 3 with light perturbation.  Verbal and contact cueing to facilitate optimal tissue loading and correct biomechanical alignment to ensure efficient and safe movement.     Wood dowel flexion from 90 deg to end range flexion with verbal cueing to complete task in a pain free ROM.     Manual Therapy: 14 mins   Position supine Intermittent bouts of manual intervention between therex sets: L post GH mobs grade 2, 30 sec bouts x3  with STM to L pectoralis.       PT Long Term Goals - 03/03/21 1003       PT LONG TERM GOAL #1   Title Pt will improve FOTO score to predicted value of 59 to improve independence with ADLs    Baseline 25 9/13: Deferred    Time 8    Period Weeks    Status Not Met    Target Date 04/27/21      PT LONG TERM GOAL #2   Title Pt will improve L shoulder strengt tested via MMT Flex/abd to 2/5 to promote increase LUE use during grooming and feeding.    Baseline 0/5 08/10: MMT R Shoulder   Flexion: 2-/5  Abduction: 2-/5  in supine 9/12:  MMT R Shoulder   Flexion: 2-/5  Abduction: 2-/5  in supine *pt is able to complete more ROM, however does not have the full ROM 2/2 poor strength    Time 8    Period Weeks    Status Partially Met    Target Date 04/27/21      PT LONG TERM GOAL #3   Title Pt will improve L PROM equal to R to maintain glenohumeral joint health for future functional strength gains.    Baseline L flex105  Abd 68  R flexion/abd 110; 08/10 L shoulder: AROM L shoulder: deg.  Sitting  Flex: 0  Abd: 0  Supine:  Flex: 29  Abd: 20  ER: 10     PROM L shoulder: deg.  Supine:  Flex: 109  Abd: 101  ER: 26 9/12: PROM LSupine:  Flex: 93  Abd: 67  ER: 26    Time 8    Period Weeks    Status Partially Met    Target Date  04/27/21      PT LONG TERM GOAL #4   Title Pt reports ability to don/doff pants and shirts MI without spouse assist to improve dressing ADL's.  Baseline Spouse current Mod-Max assist during dressing. 9/12: Pt is currently min ass during dressing    Time 8    Period Weeks    Status Partially Met    Target Date 04/27/21                   Plan - 04/28/21 1434     Clinical Impression Statement Today's tx was focused on progression of shoulder loading with targeted stability in the L [place] joint primarily in C5 motor patterns. Pt stated 0/10 pain post PT intervention. Pt continues to display mark LLE endurance, strength, and ROM deficits resulting in decreased safe home/community mobiity, increased pain, and limited access to QOL. Pt. will continue to benefit from skilled physical therapy to progress POC to address remaining deficits to facilitate maximum functional capacity for optimal personal health and wellness for ADLs.    Personal Factors and Comorbidities Age;Comorbidity 3+;Time since onset of injury/illness/exacerbation    Examination-Activity Limitations Bed Mobility;Carry;Lift;Dressing;Reach Overhead;Self Feeding;Transfers    Examination-Participation Restrictions Yard Work    Stability/Clinical Decision Making Unstable/Unpredictable    Clinical Decision Making High    Rehab Potential Fair    PT Frequency 2x / week    PT Duration 8 weeks    PT Treatment/Interventions ADLs/Self Care Home Management;Aquatic Therapy;Biofeedback;Cryotherapy;Electrical Stimulation;Moist Heat;Traction;Ultrasound;Fluidtherapy;Contrast Bath;Gait training;Stair training;Functional mobility training;Therapeutic activities;Therapeutic exercise;Balance training;Neuromuscular re-education;Patient/family education;Manual techniques;Passive range of motion;Dry needling;Energy conservation;Splinting;Taping;Joint Manipulations    PT Next Visit Plan Assess POC with possible recert    PT Home Exercise Plan Supine  shoulder flexion x30 morning and night.    Consulted and Agree with Plan of Care Patient             Patient will benefit from skilled therapeutic intervention in order to improve the following deficits and impairments:  Decreased activity tolerance, Decreased coordination, Decreased strength, Impaired UE functional use, Pain, Postural dysfunction, Improper body mechanics, Decreased range of motion, Decreased endurance, Decreased mobility, Decreased skin integrity, Hypomobility, Impaired sensation  Visit Diagnosis: Shoulder weakness  Abnormal posture  Pain of left upper extremity     Problem List Patient Active Problem List   Diagnosis Date Noted   Shingles 10/12/2020   Shoulder pain, left 10/12/2020   AKI (acute kidney injury) ([place]) 10/20/2019   Dilatation of thoracic aorta ([place]) 10/19/2019   Pedal edema 07/11/2019   Daytime somnolence 06/02/2019   Coronary artery disease involving native coronary artery of native heart without angina pectoris 02/08/2019   Chronic HFrEF (heart failure with reduced ejection fraction) ([place]) 02/08/2019   S/P aortic valve replacement 02/08/2019   Postoperative anemia 02/08/2019   S/P CABG x 3 45/80/9983   Acute systolic congestive heart failure ([place]) 12/21/2018   Biventricular failure ([place]) 12/21/2018   Nonrheumatic aortic valve stenosis 12/21/2018   SOB (shortness of breath) 12/11/2018   Hypoxia 12/11/2018   Shortness of breath 12/11/2018   Right shoulder pain 05/08/2015   Health care maintenance 11/12/2014   BMI 32.0-32.9,adult 06/18/2014   Elevated PSA 02/17/2014   CKD (chronic kidney disease) stage 3, GFR 30-59 ml/min (HCC) 05/21/2012   Gout 05/21/2012   Hypertension 05/02/2012   Hyperlipidemia LDL goal <70 05/02/2012   Diabetes ([place]) 05/02/2012   Pura Spice, PT, DPT # [id number] Fara Olden, SPT 04/28/2021, 3:34 PM  [place] [facility] [address] [place], [place], [address] Phone:  [phone]   Fax:  [phone]  Name: AKUL LEGGETTE MRN: [id number] Date of Birth: 11-Jan-1931

## 2021-04-29 ENCOUNTER — Encounter: Payer: Medicare HMO | Admitting: Physical Therapy

## 2021-04-30 ENCOUNTER — Ambulatory Visit: Payer: Medicare HMO | Admitting: Physical Therapy

## 2021-05-05 ENCOUNTER — Other Ambulatory Visit: Payer: Self-pay

## 2021-05-05 ENCOUNTER — Encounter: Payer: Self-pay | Admitting: Physical Therapy

## 2021-05-05 ENCOUNTER — Ambulatory Visit: Payer: Medicare HMO | Admitting: Physical Therapy

## 2021-05-05 DIAGNOSIS — R29898 Other symptoms and signs involving the musculoskeletal system: Secondary | ICD-10-CM

## 2021-05-05 DIAGNOSIS — M79602 Pain in left arm: Secondary | ICD-10-CM

## 2021-05-05 DIAGNOSIS — R293 Abnormal posture: Secondary | ICD-10-CM

## 2021-05-05 NOTE — Patient Instructions (Signed)
Access Code: [email or web address] URL: [email or web address] Date: 05/05/2021 Prepared by: Dorcas Carrow  Exercises  Seated Shoulder Flexion AAROM with Pulley Behind - 1 x daily - 5 x weekly - 3 sets - 10 reps Seated Shoulder Abduction AAROM with Pulley Behind - 1 x daily - 5 x weekly - 3 sets - 10 reps Supine Shoulder Abduction AROM - 1 x daily - 5 x weekly - 3 sets - 10 reps Seated Scapular Retraction - 1 x daily - 5 x weekly - 3 sets - 10 reps Supine Shoulder Flexion PROM to 90 Degrees - 1 x daily - 5 x weekly - 3 sets - 10 reps

## 2021-05-05 NOTE — Therapy (Signed)
[facility] [address] [place], [place], [address] Phone: [phone]   Fax:  [phone]  Physical Therapy Treatment and Discharge Summer 01/26/2021-05/05/2021  Patient Details  Name: [name] [name] MRN: [id number] Date of Birth: [birth year] Referring Provider (PT): Ray Church, MD   Encounter Date: 05/05/2021   PT End of Session - 05/05/21 1758     Visit Number 29    Number of Visits 33    Date for PT Re-Evaluation 05/05/21    Authorization - Visit Number 9    Authorization - Number of Visits 10    Progress Note Due on Visit 10    PT Start Time 1431    PT Stop Time 1516    PT Time Calculation (min) 45 min    Equipment Utilized During Treatment Other (comment)    Activity Tolerance Patient tolerated treatment well;Patient limited by pain    Behavior During Therapy [facility] for tasks assessed/performed             Past Medical History:  Diagnosis Date   Aortic stenosis    Arthritis    right shoulder   BPH (benign prostatic hypertrophy)    Coronary artery disease    Gout    Heart murmur    History of chicken pox    Hypercholesterolemia    Hypertension    Systolic heart failure ([place])    ischemic cardiomyopathy   Wears dentures    full upper and lower    Past Surgical History:  Procedure Laterality Date   AORTIC VALVE REPLACEMENT N/A 01/25/2019   Procedure: AORTIC VALVE REPLACEMENT (AVR) 65m Inspiris valve;  Surgeon: BGaye Pollack MD;  [facility];  Service: Open Heart Surgery;  Laterality: N/A;   arm surgery     right arm fx s/p "plate insertion"   CARDIAC CATHETERIZATION     CATARACT EXTRACTION W/PHACO Right 11/17/2016   Procedure: CATARACT EXTRACTION PHACO AND INTRAOCULAR LENS PLACEMENT ([place]  Right;  Surgeon: BLeandrew Koyanagi MD;  [place]  Service: Ophthalmology;  Laterality: Right;   CORONARY ARTERY BYPASS GRAFT N/A 01/25/2019   LIMA-LAD and sequential  SVG-rPDA-rPL   DENTAL SURGERY     all teeth extracted   RIGHT HEART CATH AND CORONARY ANGIOGRAPHY N/A 01/02/2019   Procedure: RIGHT HEART CATH AND CORONARY ANGIOGRAPHY;  Surgeon: ENelva Bush MD;  [facility];  Service: Cardiovascular;  Laterality: N/A;   TEE WITHOUT CARDIOVERSION N/A 01/25/2019   Procedure: TRANSESOPHAGEAL ECHOCARDIOGRAM (TEE);  Surgeon: BGaye Pollack MD;  [place]  Service: Open Heart Surgery;  Laterality: N/A;    There were no vitals filed for this visit.   Subjective Assessment - 05/05/21 1642     Subjective Pt presents to tx with continued reports of minor increases in functional mobility of the LUE. PT states minor joint stiffness prior to tx. Pt would like to d/c from PT to address wife's functional deficits and scheduling during the holidays.    Pertinent History Pt's MRI results were available 11/27/2020 with mild joint changes. Pt reported abnormal EMG studies that was conducted 2-3 weeks ago. Pt is retired from an [employer] that required traveling. Hobbies include yards work and maintaining his home. Pt is R hand dominate. Pt stated moderate aggravation with current information that was made available to him during his current treatment. Pt requested transparency with all test and measures as well as prognosis and progression.    Diagnostic  tests MRI and EMG    Patient Stated Goals " Get better"    Currently in Pain? Yes    Pain Score 3     Pain Location Shoulder    Pain Orientation Left    Pain Descriptors / Indicators Aching    Pain Onset More than a month ago               Treatment and discharge reassessment:   FOTO: 58   UE MMT: 2/5 L shoulder flexion, 2/5 L shoulder flexion  UE ROM: L shoulder flexion: 110 AROM in supine, L shoulder Abd 81 AROM in Supine, 12 ER AROM in supine.   Functional mobility: Pt currently able to don and doff pants and shirts with independence.   Updated HEP:   Access Code:  [email or web address] URL: [email or web address] Date: 05/05/2021 Prepared by: Dorcas Carrow  Exercises  Seated Shoulder Flexion AAROM with Pulley Behind - 1 x daily - 5 x weekly - 3 sets - 10 reps Seated Shoulder Abduction AAROM with Pulley Behind - 1 x daily - 5 x weekly - 3 sets - 10 reps Supine Shoulder Abduction AROM - 1 x daily - 5 x weekly - 3 sets - 10 reps Seated Scapular Retraction - 1 x daily - 5 x weekly - 3 sets - 10 reps Supine Shoulder Flexion PROM to 90 Degrees - 1 x daily - 5 x weekly - 3 sets - 10 reps          PT Education - 05/05/21 1757     Education Details Pt was educated on progression of goals, updated of HEP, and safe mobility of L GH joint.    Person(s) Educated Patient    Methods Explanation;Handout    Comprehension Verbalized understanding                 PT Long Term Goals - 05/05/21 1432       PT LONG TERM GOAL #1   Title Pt will improve FOTO score to predicted value of 59 to improve independence with ADLs    Baseline 25 9/13: Deferred 11/15: 58    Time 8    Period Weeks    Status Achieved    Target Date 05/05/21      PT LONG TERM GOAL #2   Title Pt will improve L shoulder strengt tested via MMT Flex/abd to 2/5 to promote increase LUE use during grooming and feeding.    Baseline 0/5 08/10: MMT R Shoulder   Flexion: 2-/5  Abduction: 2-/5  in supine 9/12:  MMT R Shoulder   Flexion: 2-/5  Abduction: 2-/5  in supine *pt is able to complete more ROM, however does not have the full ROM 2/2 poor strength 11/15: 2/5 L shoulder flexion, 2/5 L shoulder flexion,    Time 8    Period Weeks    Status Partially Met    Target Date 05/05/21      PT LONG TERM GOAL #3   Title Pt will improve L PROM equal to R to maintain glenohumeral joint health for future functional strength gains.    Baseline L flex105  Abd 68  R flexion/abd 110; 08/10 L shoulder: AROM L shoulder: deg.  Sitting  Flex: 0  Abd: 0  Supine:  Flex: 29  Abd: 20  ER: 10     PROM L  shoulder: deg.  Supine:  Flex: 109  Abd: 101  ER: 26 9/12: PROM LSupine:  Flex: 93  Abd: 67  ER: 26 11/15: L shoulder flexion: 110 AROM in supine, L shoulder Abd 81 AROM in Supine, 12 ER AROM in supine    Time 8    Period Weeks    Status Partially Met    Target Date 05/05/21      PT LONG TERM GOAL #4   Title Pt reports ability to don/doff pants and shirts MI without spouse assist to improve dressing ADL's.    Baseline Spouse current Mod-Max assist during dressing. 9/12: Pt is currently min ass during dressing 11/15: Pt currently able to don and doff pants and shirts with independence.    Time 8    Period Weeks    Status Achieved    Target Date 05/05/21                   Plan - 05/05/21 1758     Clinical Impression Statement Today was reassessed and d/c on this date 05/05/2021. Pt reassessment displayed the following measures: FOTO: 58. UE MMT: 2/5 L shoulder flexion, 2/5 L shoulder flexion. UE ROM: L shoulder flexion: 110 AROM in supine, L shoulder Abd 81 AROM in Supine, 12 ER AROM in supine. Functional mobility: Pt currently able to don and doff pants and shirts with independence. Pt was educated on the importance of continued HEP adherence for maintenance of therapeutic gains. Pt was d/c with fair prognosis for full return to full PLOF 2/2 slow regression.    Personal Factors and Comorbidities Age;Comorbidity 3+;Time since onset of injury/illness/exacerbation    Examination-Activity Limitations Bed Mobility;Carry;Lift;Dressing;Reach Overhead;Self Feeding;Transfers    Examination-Participation Restrictions Yard Work    Stability/Clinical Decision Making Unstable/Unpredictable    Clinical Decision Making High    Rehab Potential Fair    PT Frequency 2x / week    PT Duration 8 weeks    PT Treatment/Interventions ADLs/Self Care Home Management;Aquatic Therapy;Biofeedback;Cryotherapy;Electrical Stimulation;Moist Heat;Traction;Ultrasound;Fluidtherapy;Contrast Bath;Gait training;Stair  training;Functional mobility training;Therapeutic activities;Therapeutic exercise;Balance training;Neuromuscular re-education;Patient/family education;Manual techniques;Passive range of motion;Dry needling;Energy conservation;Splinting;Taping;Joint Manipulations    PT Next Visit Plan pt was d/c    PT Home Exercise Plan Supine shoulder flexion x30 morning and night.    Consulted and Agree with Plan of Care Patient             Patient will benefit from skilled therapeutic intervention in order to improve the following deficits and impairments:  Decreased activity tolerance, Decreased coordination, Decreased strength, Impaired UE functional use, Pain, Postural dysfunction, Improper body mechanics, Decreased range of motion, Decreased endurance, Decreased mobility, Decreased skin integrity, Hypomobility, Impaired sensation  Visit Diagnosis: Shoulder weakness  Abnormal posture  Pain of left upper extremity     Problem List Patient Active Problem List   Diagnosis Date Noted   Shingles 10/12/2020   Shoulder pain, left 10/12/2020   AKI (acute kidney injury) ([place]) 10/20/2019   Dilatation of thoracic aorta ([place]) 10/19/2019   Pedal edema 07/11/2019   Daytime somnolence 06/02/2019   Coronary artery disease involving native coronary artery of native heart without angina pectoris 02/08/2019   Chronic HFrEF (heart failure with reduced ejection fraction) ([place]) 02/08/2019   S/P aortic valve replacement 02/08/2019   Postoperative anemia 02/08/2019   S/P CABG x 3 95/74/7340   Acute systolic congestive heart failure ([place]) 12/21/2018   Biventricular failure ([place]) 12/21/2018   Nonrheumatic aortic valve stenosis 12/21/2018   SOB (shortness of breath) 12/11/2018   Hypoxia 12/11/2018   Shortness of breath 12/11/2018   Right shoulder pain 05/08/2015   Health care maintenance 11/12/2014  BMI 32.0-32.9,adult 06/18/2014   Elevated PSA 02/17/2014   CKD (chronic kidney disease) stage 3, GFR 30-59  ml/min (HCC) 05/21/2012   Gout 05/21/2012   Hypertension 05/02/2012   Hyperlipidemia LDL goal <70 05/02/2012   Diabetes ([place]) 05/02/2012   Pura Spice, PT, DPT # [id number] Fara Olden, SPT 05/06/2021, 8:47 AM  [facility] [address] [place], [place], [address] Phone: [phone]   Fax:  [phone]  Name: [name] [name] MRN: [id number] Date of Birth: [birth year]

## 2021-05-07 ENCOUNTER — Ambulatory Visit: Payer: Medicare HMO | Admitting: Physical Therapy

## 2021-05-07 DIAGNOSIS — E538 Deficiency of other specified B group vitamins: Secondary | ICD-10-CM | POA: Diagnosis not present

## 2021-05-12 ENCOUNTER — Encounter: Payer: Medicare HMO | Admitting: Physical Therapy

## 2021-05-18 ENCOUNTER — Encounter: Payer: Self-pay | Admitting: Internal Medicine

## 2021-05-18 ENCOUNTER — Ambulatory Visit (INDEPENDENT_AMBULATORY_CARE_PROVIDER_SITE_OTHER): Payer: Medicare HMO | Admitting: Internal Medicine

## 2021-05-18 ENCOUNTER — Other Ambulatory Visit: Payer: Self-pay

## 2021-05-18 DIAGNOSIS — I10 Essential (primary) hypertension: Secondary | ICD-10-CM

## 2021-05-18 DIAGNOSIS — I5022 Chronic systolic (congestive) heart failure: Secondary | ICD-10-CM | POA: Diagnosis not present

## 2021-05-18 DIAGNOSIS — E1159 Type 2 diabetes mellitus with other circulatory complications: Secondary | ICD-10-CM

## 2021-05-18 DIAGNOSIS — E785 Hyperlipidemia, unspecified: Secondary | ICD-10-CM | POA: Diagnosis not present

## 2021-05-18 DIAGNOSIS — I77810 Thoracic aortic ectasia: Secondary | ICD-10-CM | POA: Diagnosis not present

## 2021-05-18 DIAGNOSIS — M25512 Pain in left shoulder: Secondary | ICD-10-CM

## 2021-05-18 DIAGNOSIS — I350 Nonrheumatic aortic (valve) stenosis: Secondary | ICD-10-CM | POA: Diagnosis not present

## 2021-05-18 DIAGNOSIS — I2510 Atherosclerotic heart disease of native coronary artery without angina pectoris: Secondary | ICD-10-CM | POA: Diagnosis not present

## 2021-05-18 DIAGNOSIS — N1831 Chronic kidney disease, stage 3a: Secondary | ICD-10-CM

## 2021-05-18 MED ORDER — AMLODIPINE BESYLATE 10 MG PO TABS: 10.0000 mg | ORAL_TABLET | Freq: Every day | ORAL | 1 refills | Status: DC

## 2021-05-18 NOTE — Patient Instructions (Signed)
Increase amlodipine to 10mg  per day.  (You can take two of the 5mg  tablets until you run out).  I am sending in a prescription for 10mg  tablets.

## 2021-05-18 NOTE — Progress Notes (Signed)
Patient ID: ANDREU DRUDGE, male   DOB: Apr 24, 1931, 87 y.o.   MRN: [id number]   Subjective:    Patient ID: [name], male    DOB: 29-Sep-1930, 87 y.o.   MRN: [id number]  This visit occurred during the SARS-CoV-2 public health emergency.  Safety protocols were in place, including screening questions prior to the visit, additional usage of staff PPE, and extensive cleaning of exam room while observing appropriate contact time as indicated for disinfecting solutions.   Patient here for a scheduled follow up.   Chief Complaint  Patient presents with   Hypertension   .   HPI Here to follow up regarding his blood pressure.  Also has a history of hypercholesterolemia and diabetes.  Main complaint is that of persistent left shoulder/upper extremity pain.  Seeing Dr Roland Rack.  Has been to therapy.   No chest pain.  Breathing stable.  Arm/shoulder issues limit activity.  No cough or congestion.  No abdominal pain.  Bowels moving.     Past Medical History:  Diagnosis Date   Aortic stenosis    Arthritis    right shoulder   BPH (benign prostatic hypertrophy)    Coronary artery disease    Gout    Heart murmur    History of chicken pox    Hypercholesterolemia    Hypertension    Systolic heart failure (HCC)    ischemic cardiomyopathy   Wears dentures    full upper and lower   Past Surgical History:  Procedure Laterality Date   AORTIC VALVE REPLACEMENT N/A 01/25/2019   Procedure: AORTIC VALVE REPLACEMENT (AVR) 57mm Inspiris valve;  Surgeon: Gaye Pollack, MD;  [facility];  Service: Open Heart Surgery;  Laterality: N/A;   arm surgery     right arm fx s/p "plate insertion"   CARDIAC CATHETERIZATION     CATARACT EXTRACTION W/PHACO Right 11/17/2016   Procedure: CATARACT EXTRACTION PHACO AND INTRAOCULAR LENS PLACEMENT ([place])  Right;  Surgeon: Leandrew Koyanagi, MD;  Location: [place];  Service: Ophthalmology;  Laterality: Right;   CORONARY ARTERY BYPASS GRAFT N/A  01/25/2019   LIMA-LAD and sequential SVG-rPDA-rPL   DENTAL SURGERY     all teeth extracted   RIGHT HEART CATH AND CORONARY ANGIOGRAPHY N/A 01/02/2019   Procedure: RIGHT HEART CATH AND CORONARY ANGIOGRAPHY;  Surgeon: Nelva Bush, MD;  Location: [place] [facility];  Service: Cardiovascular;  Laterality: N/A;   TEE WITHOUT CARDIOVERSION N/A 01/25/2019   Procedure: TRANSESOPHAGEAL ECHOCARDIOGRAM (TEE);  Surgeon: Gaye Pollack, MD;  Location: [place];  Service: Open Heart Surgery;  Laterality: N/A;   Family History  Problem Relation Age of Onset   Leukemia Father    Congestive Heart Failure Mother    Cancer Daughter        Breast Cancer   Prostate cancer Neg Hx    Colon cancer Neg Hx    Social History   Socioeconomic History   Marital status: Married    Spouse name: Not on file   Number of children: Not on file   Years of education: Not on file   Highest education level: Not on file  Occupational History   Not on file  Tobacco Use   Smoking status: Never   Smokeless tobacco: Current    Types: Chew   Tobacco comments:    not ready to quit currently  Vaping Use   Vaping Use: Never used  Substance and Sexual Activity   Alcohol use: No  Alcohol/week: 0.0 standard drinks   Drug use: No   Sexual activity: Not Currently  Other Topics Concern   Not on file  Social History Narrative   Not on file   Social Determinants of Health   Financial Resource Strain: Low Risk    Difficulty of Paying Living Expenses: Not hard at all  Food Insecurity: No Food Insecurity   Worried About Charity fundraiser in the Last Year: Never true   [place] in the Last Year: Never true  Transportation Needs: No Transportation Needs   Lack of Transportation (Medical): No   Lack of Transportation (Non-Medical): No  Physical Activity: Not on file  Stress: No Stress Concern Present   Feeling of Stress : Not at all  Social Connections: Unknown   Frequency of Communication with Friends and  Family: Not on file   Frequency of Social Gatherings with Friends and Family: Not on file   Attends Religious Services: Not on file   Active Member of Clubs or Organizations: Not on file   Attends Archivist Meetings: Not on file   Marital Status: Married     Review of Systems  Constitutional:  Negative for appetite change and unexpected weight change.  HENT:  Negative for congestion and sinus pressure.   Respiratory:  Negative for cough, chest tightness and shortness of breath.   Cardiovascular:  Negative for chest pain, palpitations and leg swelling.  Gastrointestinal:  Negative for abdominal pain, diarrhea, nausea and vomiting.  Genitourinary:  Negative for difficulty urinating and dysuria.  Musculoskeletal:  Negative for joint swelling and myalgias.       Left shoulder/arm pain as outlined.  Limited rom.   Skin:  Negative for color change and rash.  Neurological:  Negative for dizziness, light-headedness and headaches.  Psychiatric/Behavioral:  Negative for agitation and dysphoric mood.       Objective:     BP (!) 148/90   Pulse 74   Temp 97.9 F (36.6 C)   Resp 16   Ht 5\' 10"  (1.778 m)   Wt 239 lb (108.4 kg)   SpO2 97%   BMI 34.29 kg/m  Wt Readings from Last 3 Encounters:  05/18/21 239 lb (108.4 kg)  04/09/21 241 lb (109.3 kg)  03/20/21 225 lb (102.1 kg)    Physical Exam Constitutional:      General: He is not in acute distress.    Appearance: Normal appearance. He is well-developed.  HENT:     Head: Normocephalic and atraumatic.     Right Ear: External ear normal.     Left Ear: External ear normal.  Eyes:     General: No scleral icterus.       Right eye: No discharge.        Left eye: No discharge.  Cardiovascular:     Rate and Rhythm: Normal rate and regular rhythm.  Pulmonary:     Effort: Pulmonary effort is normal. No respiratory distress.     Breath sounds: Normal breath sounds.  Abdominal:     General: Bowel sounds are normal.      Palpations: Abdomen is soft.     Tenderness: There is no abdominal tenderness.  Musculoskeletal:        General: No swelling or tenderness.     Cervical back: Neck supple. No tenderness.  Lymphadenopathy:     Cervical: No cervical adenopathy.  Skin:    Findings: No erythema or rash.  Neurological:     Mental Status:  He is alert.  Psychiatric:        Mood and Affect: Mood normal.        Behavior: Behavior normal.     Outpatient Encounter Medications as of 05/18/2021  Medication Sig   amLODipine (NORVASC) 10 MG tablet Take 1 tablet (10 mg total) by mouth daily.   cyanocobalamin 1000 MCG tablet Take 1,000 mcg by mouth daily.   Vitamin D3 (VITAMIN D) 25 MCG tablet Take 1,000 Units by mouth daily.   allopurinol (ZYLOPRIM) 100 MG tablet TAKE 1 TABLET EVERY DAY (Patient taking differently: Take 200 mg by mouth daily. TAKE 1 TABLET EVERY DAY)   aspirin EC 81 MG tablet Take 1 tablet (81 mg total) by mouth daily.   atorvastatin (LIPITOR) 80 MG tablet TAKE 1 TABLET EVERY MORNING   gabapentin (NEURONTIN) 300 MG capsule Take 300 mg by mouth at bedtime.   LORazepam (ATIVAN) 1 MG tablet Takes 1/2 to 1 tablet q day prn   losartan (COZAAR) 100 MG tablet TAKE 1 TABLET (100 MG TOTAL) BY MOUTH DAILY.   [DISCONTINUED] amLODipine (NORVASC) 5 MG tablet TAKE 1 TABLET EVERY DAY   [DISCONTINUED] carvedilol (COREG) 6.25 MG tablet TAKE 1 TABLET TWICE DAILY WITH A MEAL   [DISCONTINUED] cyanocobalamin (,VITAMIN B-12,) 1000 MCG/ML injection Inject 1,000 mcg into the muscle every 30 (thirty) days.   [DISCONTINUED] ergocalciferol (VITAMIN D2) 1.25 MG (50000 UT) capsule Take 1 capsule by mouth once a week.   No facility-administered encounter medications on file as of 05/18/2021.     Lab Results  Component Value Date   WBC 9.6 05/23/2020   HGB 13.2 05/23/2020   HCT 39.4 05/23/2020   PLT 166.0 05/23/2020   GLUCOSE 90 10/16/2020   CHOL 78 10/16/2020   TRIG 57.0 10/16/2020   HDL 24.20 (L) 10/16/2020    LDLCALC 42 10/16/2020   ALT 25 10/16/2020   AST 26 10/16/2020   NA 138 10/16/2020   K 3.5 10/31/2020   CL 100 10/16/2020   CREATININE 1.05 10/16/2020   BUN 16 10/16/2020   CO2 28 10/16/2020   TSH 4.91 (H) 10/16/2020   PSA 4.15 (H) 02/08/2014   INR 1.7 (H) 01/25/2019   HGBA1C 6.6 (H) 10/16/2020   MICROALBUR 46.0 (H) 12/05/2019    MR SHOULDER LEFT WO CONTRAST  Result Date: 03/22/2021 CLINICAL DATA:  Limited range of motion of the left arm since having shingles. No injury or prior surgery. EXAM: MRI OF THE LEFT SHOULDER WITHOUT CONTRAST TECHNIQUE: Multiplanar, multisequence MR imaging of the shoulder was performed. No intravenous contrast was administered. COMPARISON:  None. FINDINGS: Rotator cuff: Mild supraspinatus tendinosis with low-grade partial-thickness bursal surface tear anteriorly at the insertion. The infraspinatus, teres minor, and subscapularis tendons are intact. Muscles: Mild diffuse muscle edema involving the supraspinatus, infraspinatus, teres minor, and deltoid muscles. Mild diffuse rotator cuff muscle fatty infiltration without frank atrophy. Biceps long head:  Intact and normally positioned. Acromioclavicular Joint: Mild arthropathy of the acromioclavicular joint. Type I acromion. Trace subacromial/subdeltoid bursal fluid. Glenohumeral Joint: Small joint effusion. Extensive full-thickness cartilage loss over the humeral head and glenoid with subchondral marrow edema and cystic change. Labrum:  Diffusely degenerated. Bones: No acute fracture or dislocation. No suspicious bone lesion. Other: None. IMPRESSION: 1. Mild diffuse muscle edema involving the supraspinatus, infraspinatus, teres minor, and deltoid muscles. Findings are suggestive of acute denervation (brachial neuritis). 2. Mild supraspinatus tendinosis with low-grade partial-thickness bursal surface tear anteriorly at the insertion. 3. 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 Electronically Signed  By:  Titus Dubin M.D.   On: 03/22/2021 10:45       Assessment & Plan:   Problem List Items Addressed This Visit     Chronic HFrEF (heart failure with reduced ejection fraction) (HCC)    Continue losartan and coreg.  No evidence of volume overload on exam.  Follow.       Relevant Medications   amLODipine (NORVASC) 10 MG tablet   CKD (chronic kidney disease) stage 3, GFR 30-59 ml/min (HCC)    Avoid antiinflammatories.  Stay hydrated.  Follow metabolic panel.  Continue losartan.       Coronary artery disease involving native coronary artery of native heart without angina pectoris    Continue coreg, losartan and lipitor.  No chest pain.  Continue risk factor modification.       Relevant Medications   amLODipine (NORVASC) 10 MG tablet   Diabetes ([place])    On no medication.  Stay hydrated.  Monitor sugars.      Dilatation of thoracic aorta (HCC)    S/p surgery.  Continues to be followed by cardiology - monitoring.       Relevant Medications   amLODipine (NORVASC) 10 MG tablet   Hyperlipidemia LDL goal <70    Continue lipitor.  Low cholesterol diet and exercise.  Follow lipid panel and liver function tests.       Relevant Medications   amLODipine (NORVASC) 10 MG tablet   Hypertension    Continue losartan, amlodipine and Coreg.  Will increase amlodipine to 10mg  q day.  Follow blood pressure. Follow metabolic panel.  Monitor for increased lower extremity/pedal swelling.  Unable to increase coreg - pulse rate 60.        Relevant Medications   amLODipine (NORVASC) 10 MG tablet   Nonrheumatic aortic valve stenosis    Continue follow up with cardiology.       Relevant Medications   amLODipine (NORVASC) 10 MG tablet   Shoulder pain, left    Has seen ortho.  Has seen neurology.  Persistent pain and limited rom. Has been to PT.  Follow.         Einar Pheasant, MD

## 2021-05-19 DIAGNOSIS — I10 Essential (primary) hypertension: Secondary | ICD-10-CM | POA: Diagnosis not present

## 2021-05-19 DIAGNOSIS — E569 Vitamin deficiency, unspecified: Secondary | ICD-10-CM | POA: Diagnosis not present

## 2021-05-19 DIAGNOSIS — M5412 Radiculopathy, cervical region: Secondary | ICD-10-CM | POA: Diagnosis not present

## 2021-05-19 DIAGNOSIS — B0223 Postherpetic polyneuropathy: Secondary | ICD-10-CM | POA: Diagnosis not present

## 2021-05-19 DIAGNOSIS — G8929 Other chronic pain: Secondary | ICD-10-CM | POA: Diagnosis not present

## 2021-05-19 DIAGNOSIS — M25512 Pain in left shoulder: Secondary | ICD-10-CM | POA: Diagnosis not present

## 2021-05-21 ENCOUNTER — Other Ambulatory Visit: Payer: Self-pay | Admitting: Internal Medicine

## 2021-05-23 ENCOUNTER — Encounter: Payer: Self-pay | Admitting: Internal Medicine

## 2021-05-23 NOTE — Assessment & Plan Note (Signed)
Continue losartan, amlodipine and Coreg.  Will increase amlodipine to 10mg  q day.  Follow blood pressure. Follow metabolic panel.  Monitor for increased lower extremity/pedal swelling.  Unable to increase coreg - pulse rate 60.

## 2021-05-23 NOTE — Assessment & Plan Note (Signed)
Continue follow-up with cardiology 

## 2021-05-23 NOTE — Assessment & Plan Note (Signed)
On no medication.  Stay hydrated.  Monitor sugars.

## 2021-05-23 NOTE — Assessment & Plan Note (Signed)
Continue coreg, losartan and lipitor.  No chest pain.  Continue risk factor modification.  

## 2021-05-23 NOTE — Assessment & Plan Note (Signed)
Has seen ortho.  Has seen neurology.  Persistent pain and limited rom. Has been to PT.  Follow.

## 2021-05-23 NOTE — Assessment & Plan Note (Signed)
Continue losartan and coreg.  No evidence of volume overload on exam.  Follow.

## 2021-05-23 NOTE — Assessment & Plan Note (Signed)
Continue lipitor.  Low cholesterol diet and exercise.  Follow lipid panel and liver function tests.   

## 2021-05-23 NOTE — Assessment & Plan Note (Signed)
Avoid antiinflammatories.  Stay hydrated.  Follow metabolic panel. Continue losartan.  

## 2021-05-23 NOTE — Assessment & Plan Note (Signed)
S/p surgery.  Continues to be followed by cardiology - monitoring.  

## 2021-05-29 ENCOUNTER — Telehealth: Payer: Self-pay | Admitting: Internal Medicine

## 2021-05-29 NOTE — Telephone Encounter (Signed)
Patient called in to confirm appointment. I advised patient of lab appt date and time  06/03/21 10:30

## 2021-06-03 ENCOUNTER — Other Ambulatory Visit (INDEPENDENT_AMBULATORY_CARE_PROVIDER_SITE_OTHER): Payer: Medicare HMO

## 2021-06-03 ENCOUNTER — Other Ambulatory Visit: Payer: Self-pay

## 2021-06-03 DIAGNOSIS — E785 Hyperlipidemia, unspecified: Secondary | ICD-10-CM

## 2021-06-03 DIAGNOSIS — E1159 Type 2 diabetes mellitus with other circulatory complications: Secondary | ICD-10-CM | POA: Diagnosis not present

## 2021-06-03 LAB — MICROALBUMIN / CREATININE URINE RATIO
Creatinine,U: 110.5 mg/dL
Microalb Creat Ratio: 239.9 mg/g — ABNORMAL HIGH (ref 0.0–30.0)
Microalb, Ur: 265 mg/dL — ABNORMAL HIGH (ref 0.0–1.9)

## 2021-06-03 LAB — HEPATIC FUNCTION PANEL
ALT: 7 U/L (ref 0–53)
AST: 14 U/L (ref 0–37)
Albumin: 4 g/dL (ref 3.5–5.2)
Alkaline Phosphatase: 71 U/L (ref 39–117)
Bilirubin, Direct: 0.2 mg/dL (ref 0.0–0.3)
Total Bilirubin: 0.7 mg/dL (ref 0.2–1.2)
Total Protein: 6.2 g/dL (ref 6.0–8.3)

## 2021-06-03 LAB — CBC WITH DIFFERENTIAL/PLATELET
Basophils Absolute: 0 10*3/uL (ref 0.0–0.1)
Basophils Relative: 0.4 % (ref 0.0–3.0)
Eosinophils Absolute: 0.3 10*3/uL (ref 0.0–0.7)
Eosinophils Relative: 3.2 % (ref 0.0–5.0)
HCT: 35.5 % — ABNORMAL LOW (ref 39.0–52.0)
Hemoglobin: 12.1 g/dL — ABNORMAL LOW (ref 13.0–17.0)
Lymphocytes Relative: 49.9 % — ABNORMAL HIGH (ref 12.0–46.0)
Lymphs Abs: 4.7 10*3/uL — ABNORMAL HIGH (ref 0.7–4.0)
MCHC: 34 g/dL (ref 30.0–36.0)
MCV: 95.9 fl (ref 78.0–100.0)
Monocytes Absolute: 0.7 10*3/uL (ref 0.1–1.0)
Monocytes Relative: 7.6 % (ref 3.0–12.0)
Neutro Abs: 3.7 10*3/uL (ref 1.4–7.7)
Neutrophils Relative %: 38.9 % — ABNORMAL LOW (ref 43.0–77.0)
Platelets: 182 10*3/uL (ref 150.0–400.0)
RBC: 3.7 Mil/uL — ABNORMAL LOW (ref 4.22–5.81)
RDW: 14.4 % (ref 11.5–15.5)
WBC: 9.4 10*3/uL (ref 4.0–10.5)

## 2021-06-03 LAB — BASIC METABOLIC PANEL
BUN: 21 mg/dL (ref 6–23)
CO2: 23 mEq/L (ref 19–32)
Calcium: 9.2 mg/dL (ref 8.4–10.5)
Chloride: 107 mEq/L (ref 96–112)
Creatinine, Ser: 1.17 mg/dL (ref 0.40–1.50)
GFR: 55.54 mL/min — ABNORMAL LOW (ref >60.00)
Glucose, Bld: 96 mg/dL (ref 70–99)
Potassium: 3.7 mEq/L (ref 3.5–5.1)
Sodium: 141 mEq/L (ref 135–145)

## 2021-06-03 LAB — LIPID PANEL
Cholesterol: 103 mg/dL (ref 0–200)
HDL: 41.4 mg/dL (ref >39.00)
LDL Cholesterol: 52 mg/dL (ref 0–99)
NonHDL: 62.05
Total CHOL/HDL Ratio: 2
Triglycerides: 49 mg/dL (ref 0.0–149.0)
VLDL: 9.8 mg/dL (ref 0.0–40.0)

## 2021-06-03 LAB — TSH: TSH: 6.25 u[IU]/mL — ABNORMAL HIGH (ref 0.35–5.50)

## 2021-06-03 LAB — HEMOGLOBIN A1C: Hgb A1c MFr Bld: 5.6 % (ref 4.6–6.5)

## 2021-06-04 ENCOUNTER — Other Ambulatory Visit: Payer: Self-pay | Admitting: Internal Medicine

## 2021-06-04 DIAGNOSIS — R7989 Other specified abnormal findings of blood chemistry: Secondary | ICD-10-CM

## 2021-06-04 DIAGNOSIS — D649 Anemia, unspecified: Secondary | ICD-10-CM | POA: Insufficient documentation

## 2021-06-04 DIAGNOSIS — E538 Deficiency of other specified B group vitamins: Secondary | ICD-10-CM

## 2021-06-04 NOTE — Progress Notes (Signed)
Orders placed for f/u labs.  

## 2021-06-05 ENCOUNTER — Other Ambulatory Visit: Payer: Self-pay

## 2021-06-05 MED ORDER — ALLOPURINOL 100 MG PO TABS: ORAL_TABLET | ORAL | 1 refills | Status: DC

## 2021-06-23 ENCOUNTER — Other Ambulatory Visit (INDEPENDENT_AMBULATORY_CARE_PROVIDER_SITE_OTHER): Payer: Medicare HMO

## 2021-06-23 ENCOUNTER — Other Ambulatory Visit: Payer: Self-pay

## 2021-06-23 DIAGNOSIS — R7989 Other specified abnormal findings of blood chemistry: Secondary | ICD-10-CM | POA: Diagnosis not present

## 2021-06-23 DIAGNOSIS — D649 Anemia, unspecified: Secondary | ICD-10-CM | POA: Diagnosis not present

## 2021-06-23 DIAGNOSIS — E538 Deficiency of other specified B group vitamins: Secondary | ICD-10-CM | POA: Diagnosis not present

## 2021-06-23 LAB — CBC WITH DIFFERENTIAL/PLATELET
Basophils Absolute: 0.1 10*3/uL (ref 0.0–0.1)
Basophils Relative: 0.8 % (ref 0.0–3.0)
Eosinophils Absolute: 0.3 10*3/uL (ref 0.0–0.7)
Eosinophils Relative: 3.3 % (ref 0.0–5.0)
HCT: 36 % — ABNORMAL LOW (ref 39.0–52.0)
Hemoglobin: 12.2 g/dL — ABNORMAL LOW (ref 13.0–17.0)
Lymphocytes Relative: 47.2 % — ABNORMAL HIGH (ref 12.0–46.0)
Lymphs Abs: 4.8 10*3/uL — ABNORMAL HIGH (ref 0.7–4.0)
MCHC: 33.7 g/dL (ref 30.0–36.0)
MCV: 95.5 fl (ref 78.0–100.0)
Monocytes Absolute: 0.9 10*3/uL (ref 0.1–1.0)
Monocytes Relative: 8.4 % (ref 3.0–12.0)
Neutro Abs: 4.1 10*3/uL (ref 1.4–7.7)
Neutrophils Relative %: 40.3 % — ABNORMAL LOW (ref 43.0–77.0)
Platelets: 185 10*3/uL (ref 150.0–400.0)
RBC: 3.77 Mil/uL — ABNORMAL LOW (ref 4.22–5.81)
RDW: 14.5 % (ref 11.5–15.5)
WBC: 10.1 10*3/uL (ref 4.0–10.5)

## 2021-06-23 LAB — IBC + FERRITIN
Ferritin: 88.8 ng/mL (ref 22.0–322.0)
Iron: 77 ug/dL (ref 42–165)
Saturation Ratios: 21.8 % (ref 20.0–50.0)
TIBC: 352.8 ug/dL (ref 250.0–450.0)
Transferrin: 252 mg/dL (ref 212.0–360.0)

## 2021-06-23 LAB — VITAMIN B12: Vitamin B-12: 579 pg/mL (ref 211–911)

## 2021-06-23 LAB — T4, FREE: Free T4: 0.9 ng/dL (ref 0.60–1.60)

## 2021-06-23 LAB — TSH: TSH: 6.71 u[IU]/mL — ABNORMAL HIGH (ref 0.35–5.50)

## 2021-06-25 ENCOUNTER — Telehealth: Payer: Self-pay

## 2021-06-25 NOTE — Telephone Encounter (Signed)
I tried to reach patient & both numbers for results in chart. One the VM was full & the other only rang.

## 2021-06-26 NOTE — Telephone Encounter (Signed)
See result note. Pt called and informed.

## 2021-06-26 NOTE — Telephone Encounter (Signed)
Patient called in stated he was returning a call, Please call patient back @ [phone]

## 2021-07-20 ENCOUNTER — Other Ambulatory Visit: Payer: Self-pay | Admitting: Internal Medicine

## 2021-08-06 ENCOUNTER — Ambulatory Visit: Payer: Medicare HMO | Admitting: Internal Medicine

## 2021-08-06 ENCOUNTER — Other Ambulatory Visit: Payer: Self-pay

## 2021-08-06 ENCOUNTER — Encounter: Payer: Self-pay | Admitting: Internal Medicine

## 2021-08-06 VITALS — BP 140/62 | HR 54 | Ht 70.0 in | Wt 242.0 lb

## 2021-08-06 DIAGNOSIS — I10 Essential (primary) hypertension: Secondary | ICD-10-CM | POA: Diagnosis not present

## 2021-08-06 DIAGNOSIS — Z952 Presence of prosthetic heart valve: Secondary | ICD-10-CM

## 2021-08-06 DIAGNOSIS — I2510 Atherosclerotic heart disease of native coronary artery without angina pectoris: Secondary | ICD-10-CM

## 2021-08-06 DIAGNOSIS — R600 Localized edema: Secondary | ICD-10-CM

## 2021-08-06 DIAGNOSIS — I5022 Chronic systolic (congestive) heart failure: Secondary | ICD-10-CM | POA: Diagnosis not present

## 2021-08-06 DIAGNOSIS — I350 Nonrheumatic aortic (valve) stenosis: Secondary | ICD-10-CM | POA: Diagnosis not present

## 2021-08-06 MED ORDER — AMLODIPINE BESYLATE 10 MG PO TABS: 5.0000 mg | ORAL_TABLET | Freq: Every day | ORAL | Status: DC

## 2021-08-06 MED ORDER — TRIAMTERENE-HCTZ 37.5-25 MG PO CAPS: 1.0000 | ORAL_CAPSULE | Freq: Every day | ORAL | 0 refills | Status: DC

## 2021-08-06 NOTE — Progress Notes (Signed)
Follow-up Outpatient Visit Date: 08/06/2021  Primary Care Provider: Einar [name], [name] [name] [address] [id number]  Chief Complaint: Leg swelling  HPI:  [name] is a 88 y.o. male with history of coronary artery disease and systolic heart failure secondary to ischemic cardiomyopathy complicated by severe aortic stenosis status post CABG (LIMA to LAD and sequential SVG to RPDA and RPL) and bioprosthetic aortic valve replacement on 01/25/2019, hypertension, hyperlipidemia, diabetes mellitus, asthma, arthritis, gout, and BPH, who presents for follow-up of coronary artery disease, cardiomyopathy, and aortic stenosis status post AVR.  I last saw him in 01/2021, at which time [name] was doing well from a heart standpoint.  He was dealing with left arm weakness that began around the time that he developed shingles.  He was working with neurology and physical therapy.  We did not make any medication changes or pursue additional testing.  Today, [name] is concerned about leg swelling that began a couple of months ago.  He initially attributed it to gabapentin after reading about potential side effects.  He has been off it for ~8 weeks and has only noticed slight improvement in his leg swelling.  He notes that the edema is typically gone in the morning but gradually returns when he is on his feet.  His weight has been stable.  He denies chest pain, palpitations, and lightheadedness.  He has felt a little short of breath recently which she attributes to a viral infection he may have contracted from his wife.  This is getting better with Mucinex use.  [name] does not check his blood pressure at home.  He notes that it had been somewhat elevated when he last saw Dr. Nicki [name], prompting her to double amlodipine from 5 mg to 10 mg daily.  He had been on triamterene-HCTZ through 2021, though it was discontinued due to a transient bump in  creatinine.  --------------------------------------------------------------------------------------------------  Past Medical History:  Diagnosis Date   Aortic stenosis    Arthritis    right shoulder   BPH (benign prostatic hypertrophy)    Coronary artery disease    Gout    Heart murmur    History of chicken pox    Hypercholesterolemia    Hypertension    Systolic heart failure (HCC)    ischemic cardiomyopathy   Wears dentures    full upper and lower   Past Surgical History:  Procedure Laterality Date   AORTIC VALVE REPLACEMENT N/A 01/25/2019   Procedure: AORTIC VALVE REPLACEMENT (AVR) 43mm Inspiris valve;  Surgeon: [name] Pollack, MD;  [facility];  Service: Open Heart Surgery;  Laterality: N/A;   arm surgery     right arm fx s/p "plate insertion"   CARDIAC CATHETERIZATION     CATARACT EXTRACTION W/PHACO Right 11/17/2016   Procedure: CATARACT EXTRACTION PHACO AND INTRAOCULAR LENS PLACEMENT ([place])  Right;  Surgeon: [name] Koyanagi, MD;  Location: [facility];  Service: Ophthalmology;  Laterality: Right;   CORONARY ARTERY BYPASS GRAFT N/A 01/25/2019   LIMA-LAD and sequential SVG-rPDA-rPL   DENTAL SURGERY     all teeth extracted   RIGHT HEART CATH AND CORONARY ANGIOGRAPHY N/A 01/02/2019   Procedure: RIGHT HEART CATH AND CORONARY ANGIOGRAPHY;  Surgeon: [name] Bush, MD;  Location: [facility];  Service: Cardiovascular;  Laterality: N/A;   TEE WITHOUT CARDIOVERSION N/A 01/25/2019   Procedure: TRANSESOPHAGEAL ECHOCARDIOGRAM (TEE);  Surgeon: [name] Pollack, MD;  Location: [facility];  Service: Open Heart Surgery;  Laterality: N/A;    Recent CV Pertinent Labs: Lab Results  Component Value Date   CHOL 103 06/03/2021   HDL 41.40 06/03/2021   LDLCALC 52 06/03/2021   TRIG 49.0 06/03/2021   CHOLHDL 2 06/03/2021   INR 1.7 (H) 01/25/2019   BNP 3,136.0 (H) 12/13/2018   K 3.7 06/03/2021   MG 2.1 10/19/2019   BUN 21 06/03/2021   BUN 29 (H) 10/04/2019   CREATININE  1.17 06/03/2021    Past medical and surgical history were reviewed and updated in EPIC.  Current Meds  Medication Sig   allopurinol (ZYLOPRIM) 100 MG tablet TAKE 1 TABLET EVERY DAY   amLODipine (NORVASC) 10 MG tablet Take 1 tablet (10 mg total) by mouth daily.   aspirin EC 81 MG tablet Take 1 tablet (81 mg total) by mouth daily.   atorvastatin (LIPITOR) 80 MG tablet TAKE 1 TABLET EVERY MORNING   carvedilol (COREG) 6.25 MG tablet TAKE 1 TABLET TWICE DAILY WITH A MEAL   cyanocobalamin 1000 MCG tablet Take 1,000 mcg by mouth daily.   LORazepam (ATIVAN) 1 MG tablet Takes 1/2 to 1 tablet q day prn   losartan (COZAAR) 100 MG tablet TAKE 1 TABLET EVERY DAY   Vitamin D3 (VITAMIN D) 25 MCG tablet Take 1,000 Units by mouth daily.    Allergies: Patient has no known allergies.  Social History   Tobacco Use   Smoking status: Never   Smokeless tobacco: Current    Types: Chew   Tobacco comments:    not ready to quit currently  Vaping Use   Vaping Use: Never used  Substance Use Topics   Alcohol use: No    Alcohol/week: 0.0 standard drinks   Drug use: No    Family History  Problem Relation Age of Onset   Leukemia Father    Congestive Heart Failure Mother    Cancer Daughter        Breast Cancer   Prostate cancer Neg Hx    Colon cancer Neg Hx     Review of Systems: A 12-system review of systems was performed and was negative except as noted in the HPI.  --------------------------------------------------------------------------------------------------  Physical Exam: BP 140/62 (BP Location: Left Arm, Patient Position: Sitting, Cuff Size: Large)    Pulse (!) 54    Ht 5\' 10"  (1.778 m)    Wt 242 lb (109.8 kg)    SpO2 95%    BMI 34.72 kg/m   General:  NAD. Neck: JVP approximately 8 cm with positive HJR. Lungs: Clear to auscultation bilaterally without wheezes or crackles. Heart: Bradycardic but regular with 2/6 systolic murmur. Abdomen: Soft, nontender, nondistended. Extremities:  No lower extremity edema.  EKG:  Sinus bradycardia with borderline first-degree AV block, isolated PVC, borderline LVH, and lateral T wave inversions.  Compared to prior tracing from 01/28/2021, heart rate has decreased, PVC is new, and nonspecific T wave changes are now present in the lateral precordial T waves.  Lab Results  Component Value Date   WBC 10.1 06/23/2021   HGB 12.2 (L) 06/23/2021   HCT 36.0 (L) 06/23/2021   MCV 95.5 06/23/2021   PLT 185.0 06/23/2021    Lab Results  Component Value Date   NA 141 06/03/2021   K 3.7 06/03/2021   CL 107 06/03/2021   CO2 23 06/03/2021   BUN 21 06/03/2021   CREATININE 1.17 06/03/2021   GLUCOSE 96 06/03/2021   ALT 7 06/03/2021    Lab Results  Component Value Date  CHOL 103 06/03/2021   HDL 41.40 06/03/2021   LDLCALC 52 06/03/2021   TRIG 49.0 06/03/2021   CHOLHDL 2 06/03/2021    --------------------------------------------------------------------------------------------------  ASSESSMENT AND PLAN: Leg edema and hypertension: Edema is dependent but is fairly pronounced on examination today.  [name] also endorses some mild dyspnea that he attributes to a recent viral infection.  His lungs are clear though JVP is mildly elevated.  I am concerned that he may have some element of fluid retention, with leg edema also exacerbated by escalation of amlodipine a couple of months ago.  We have agreed to decrease amlodipine back to 5 mg daily and to restart triamterene-HCTZ 37.5/25 mg once daily.  [name] is due to see Dr. Nicki [name] in about 2 weeks, at which time a BMP should be rechecked.  I encouraged [name] to elevate his legs and to use compression stockings when possible.  Sodium restriction also encouraged.  Chronic HFrEF with recovered ejection fraction: As above, I suspect amlodipine is primarily driving leg edema though some degree of heart failure may be contributing as well.  We will work on medication changes, as above.   Continue current doses of carvedilol and losartan.  Bradycardia precludes further escalation of beta-blockade.  If edema does not improve with aforementioned changes, transitioning to furosemide and repeat echocardiogram will need to be considered at follow-up visit.  Coronary artery disease: No angina reported.  Continue secondary prevention with aspirin and atorvastatin.  Aortic stenosis status post bioprosthetic AVR: Soft murmur appreciated on exam.  Echocardiogram a year ago showed normal function of bioprosthesis.  If edema does not improve with medication changes outlined above, repeat echocardiogram will need to be considered.  Hyperlipidemia: Lipids at goal.  Continue atorvastatin 80 mg daily.  Follow-up: Return to clinic in 3 months.  [name] Bush, MD 08/06/2021 1:31 PM

## 2021-08-06 NOTE — Progress Notes (Signed)
Thank you for the update.  I will check a met b at his follow up appointment.   ° °Charlene Scott °

## 2021-08-06 NOTE — Patient Instructions (Signed)
Medication Instructions:   Your physician has recommended you make the following change in your medication:   DECREASE Amlodipine 5 mg daily - You may cut your current tablet (10 mg) in HALF   START Triamterene-HCTZ 37.5-25 mg daily   *If you need a refill on your cardiac medications before your next appointment, please call your pharmacy*   Lab Work:  With PCP  Testing/Procedures:  None ordered   Follow-Up: At [facility], you and your health needs are our priority.  As part of our continuing mission to provide you with exceptional heart care, we have created designated Provider Care Teams.  These Care Teams include your primary Cardiologist (physician) and Advanced Practice Providers (APPs -  Physician Assistants and Nurse Practitioners) who all work together to provide you with the care you need, when you need it.  We recommend signing up for the patient portal called "MyChart".  Sign up information is provided on this After Visit Summary.  MyChart is used to connect with patients for Virtual Visits (Telemedicine).  Patients are able to view lab/test results, encounter notes, upcoming appointments, etc.  Non-urgent messages can be sent to your provider as well.   To learn more about what you can do with MyChart, go to [email or web address].    Your next appointment:   3 month(s)  The format for your next appointment:   In Person  Provider:   You may see Nelva Bush, MD or one of the following Advanced Practice Providers on your designated Care Team:   Murray Hodgkins, NP Christell Faith, PA-C Cadence Kathlen Mody, [place]

## 2021-08-14 ENCOUNTER — Ambulatory Visit: Payer: Medicare HMO | Admitting: Internal Medicine

## 2021-08-19 ENCOUNTER — Ambulatory Visit (INDEPENDENT_AMBULATORY_CARE_PROVIDER_SITE_OTHER): Payer: Medicare HMO | Admitting: Internal Medicine

## 2021-08-19 ENCOUNTER — Other Ambulatory Visit: Payer: Self-pay

## 2021-08-19 VITALS — BP 128/70 | HR 70 | Temp 98.0°F | Resp 18 | Ht 70.0 in | Wt 228.0 lb

## 2021-08-19 DIAGNOSIS — I5022 Chronic systolic (congestive) heart failure: Secondary | ICD-10-CM

## 2021-08-19 DIAGNOSIS — R7989 Other specified abnormal findings of blood chemistry: Secondary | ICD-10-CM

## 2021-08-19 DIAGNOSIS — E785 Hyperlipidemia, unspecified: Secondary | ICD-10-CM

## 2021-08-19 DIAGNOSIS — I77810 Thoracic aortic ectasia: Secondary | ICD-10-CM | POA: Diagnosis not present

## 2021-08-19 DIAGNOSIS — I10 Essential (primary) hypertension: Secondary | ICD-10-CM | POA: Diagnosis not present

## 2021-08-19 DIAGNOSIS — D649 Anemia, unspecified: Secondary | ICD-10-CM

## 2021-08-19 DIAGNOSIS — N1831 Chronic kidney disease, stage 3a: Secondary | ICD-10-CM

## 2021-08-19 DIAGNOSIS — E1159 Type 2 diabetes mellitus with other circulatory complications: Secondary | ICD-10-CM | POA: Diagnosis not present

## 2021-08-19 DIAGNOSIS — E119 Type 2 diabetes mellitus without complications: Secondary | ICD-10-CM | POA: Diagnosis not present

## 2021-08-19 LAB — CBC WITH DIFFERENTIAL/PLATELET
Basophils Absolute: 0.1 10*3/uL (ref 0.0–0.1)
Basophils Relative: 0.6 % (ref 0.0–3.0)
Eosinophils Absolute: 0.4 10*3/uL (ref 0.0–0.7)
Eosinophils Relative: 5.1 % — ABNORMAL HIGH (ref 0.0–5.0)
HCT: 35.1 % — ABNORMAL LOW (ref 39.0–52.0)
Hemoglobin: 11.6 g/dL — ABNORMAL LOW (ref 13.0–17.0)
Lymphocytes Relative: 42.3 % (ref 12.0–46.0)
Lymphs Abs: 3.4 10*3/uL (ref 0.7–4.0)
MCHC: 33 g/dL (ref 30.0–36.0)
MCV: 97.5 fl (ref 78.0–100.0)
Monocytes Absolute: 0.7 10*3/uL (ref 0.1–1.0)
Monocytes Relative: 8.6 % (ref 3.0–12.0)
Neutro Abs: 3.5 10*3/uL (ref 1.4–7.7)
Neutrophils Relative %: 43.4 % (ref 43.0–77.0)
Platelets: 182 10*3/uL (ref 150.0–400.0)
RBC: 3.6 Mil/uL — ABNORMAL LOW (ref 4.22–5.81)
RDW: 15 % (ref 11.5–15.5)
WBC: 8 10*3/uL (ref 4.0–10.5)

## 2021-08-19 LAB — BASIC METABOLIC PANEL
BUN: 22 mg/dL (ref 6–23)
CO2: 26 mEq/L (ref 19–32)
Calcium: 9.5 mg/dL (ref 8.4–10.5)
Chloride: 102 mEq/L (ref 96–112)
Creatinine, Ser: 1.48 mg/dL (ref 0.40–1.50)
GFR: 41.83 mL/min — ABNORMAL LOW (ref >60.00)
Glucose, Bld: 110 mg/dL — ABNORMAL HIGH (ref 70–99)
Potassium: 3.5 mEq/L (ref 3.5–5.1)
Sodium: 138 mEq/L (ref 135–145)

## 2021-08-19 LAB — T4, FREE: Free T4: 0.91 ng/dL (ref 0.60–1.60)

## 2021-08-19 LAB — TSH: TSH: 8.75 u[IU]/mL — ABNORMAL HIGH (ref 0.35–5.50)

## 2021-08-19 MED ORDER — AMLODIPINE BESYLATE 5 MG PO TABS: 5.0000 mg | ORAL_TABLET | Freq: Every day | ORAL | 1 refills | Status: DC

## 2021-08-19 NOTE — Progress Notes (Signed)
Patient ID: [name] [name], male   DOB: [birth year], 88 y.o.   MRN: [id number]   Subjective:    Patient ID: [name] [name], male    DOB: 1930/12/24, 88 y.o.   MRN: [id number]  This visit occurred during the SARS-CoV-2 public health emergency.  Safety protocols were in place, including screening questions prior to the visit, additional usage of staff PPE, and extensive cleaning of exam room while observing appropriate contact time as indicated for disinfecting solutions.   Patient here for scheduled follow up.   Chief Complaint  Patient presents with   Hypertension   .   HPI Recently evaluated by Dr End with concerns regarding increased leg swelling.  Medications adjusted.  Amlodipine decreased to $RemoveBefo'5mg'YdevenZstDh$  q day.  Triam/hctz added back.  He reports leg swelling is better.  Still notices some - end of day.  Better (and not a significant issue in the am).  Breathing is stable.  No chest pain.  No increased cough or congestion.  Weight is down.  No abdominal pain.  Bowels moving.    Past Medical History:  Diagnosis Date   Aortic stenosis    Arthritis    right shoulder   BPH (benign prostatic hypertrophy)    Coronary artery disease    Gout    Heart murmur    History of chicken pox    Hypercholesterolemia    Hypertension    Systolic heart failure (HCC)    ischemic cardiomyopathy   Wears dentures    full upper and lower   Past Surgical History:  Procedure Laterality Date   AORTIC VALVE REPLACEMENT N/A 01/25/2019   Procedure: AORTIC VALVE REPLACEMENT (AVR) 37mm Inspiris valve;  Surgeon: Gaye Pollack, MD;  Location[facility];  Service: Open Heart Surgery;  Laterality: N/A;   arm surgery     right arm fx s/p "plate insertion"   CARDIAC CATHETERIZATION     CATARACT EXTRACTION W/PHACO Right 11/17/2016   Procedure: CATARACT EXTRACTION PHACO AND INTRAOCULAR LENS PLACEMENT ([place])  Right;  Surgeon: Leandrew Koyanagi, MD;  Location: [facility];  Service: Ophthalmology;  Laterality:  Right;   CORONARY ARTERY BYPASS GRAFT N/A 01/25/2019   LIMA-LAD and sequential SVG-rPDA-rPL   DENTAL SURGERY     all teeth extracted   RIGHT HEART CATH AND CORONARY ANGIOGRAPHY N/A 01/02/2019   Procedure: RIGHT HEART CATH AND CORONARY ANGIOGRAPHY;  Surgeon: Nelva Bush, MD;  Location: [facility];  Service: Cardiovascular;  Laterality: N/A;   TEE WITHOUT CARDIOVERSION N/A 01/25/2019   Procedure: TRANSESOPHAGEAL ECHOCARDIOGRAM (TEE);  Surgeon: Gaye Pollack, MD;  Location: [facility];  Service: Open Heart Surgery;  Laterality: N/A;   Family History  Problem Relation Age of Onset   Leukemia Father    Congestive Heart Failure Mother    Cancer Daughter        Breast Cancer   Prostate cancer Neg Hx    Colon cancer Neg Hx    Social History   Socioeconomic History   Marital status: Married    Spouse name: Not on file   Number of children: Not on file   Years of education: Not on file   Highest education level: Not on file  Occupational History   Not on file  Tobacco Use   Smoking status: Never   Smokeless tobacco: Current    Types: Chew   Tobacco comments:    not ready to quit currently  Vaping Use   Vaping Use: Never used  Substance  and Sexual Activity   Alcohol use: No    Alcohol/week: 0.0 standard drinks   Drug use: No   Sexual activity: Not Currently  Other Topics Concern   Not on file  Social History Narrative   Not on file   Social Determinants of Health   Financial Resource Strain: Low Risk    Difficulty of Paying Living Expenses: Not hard at all  Food Insecurity: No Food Insecurity   Worried About Charity fundraiser in the Last Year: Never true   [place] in the Last Year: Never true  Transportation Needs: No Transportation Needs   Lack of Transportation (Medical): No   Lack of Transportation (Non-Medical): No  Physical Activity: Not on file  Stress: No Stress Concern Present   Feeling of Stress : Not at all  Social Connections: Unknown    Frequency of Communication with Friends and Family: Not on file   Frequency of Social Gatherings with Friends and Family: Not on file   Attends Religious Services: Not on file   Active Member of Clubs or Organizations: Not on file   Attends Archivist Meetings: Not on file   Marital Status: Married     Review of Systems  Constitutional:  Negative for appetite change and unexpected weight change.  HENT:  Negative for congestion and sinus pressure.   Respiratory:  Negative for cough and chest tightness.        Breathing stable.   Cardiovascular:  Negative for chest pain and palpitations.       Leg swelling - improved.   Gastrointestinal:  Negative for abdominal pain, diarrhea, nausea and vomiting.  Genitourinary:  Negative for difficulty urinating and dysuria.  Musculoskeletal:  Negative for joint swelling and myalgias.       Left shoulder/arm pain - limited rom.   Skin:  Negative for color change and rash.  Neurological:  Negative for dizziness, light-headedness and headaches.  Psychiatric/Behavioral:  Negative for agitation and dysphoric mood.       Objective:     BP 128/70    Pulse 70    Temp 98 F (36.7 C)    Resp 18    Ht $R'5\' 10"'MC$  (1.778 m)    Wt 228 lb (103.4 kg)    SpO2 96%    BMI 32.71 kg/m  Wt Readings from Last 3 Encounters:  08/19/21 228 lb (103.4 kg)  08/06/21 242 lb (109.8 kg)  05/18/21 239 lb (108.4 kg)    Physical Exam Constitutional:      General: He is not in acute distress.    Appearance: Normal appearance. He is well-developed.  HENT:     Head: Normocephalic and atraumatic.     Right Ear: External ear normal.     Left Ear: External ear normal.  Eyes:     General: No scleral icterus.       Right eye: No discharge.        Left eye: No discharge.  Cardiovascular:     Rate and Rhythm: Normal rate and regular rhythm.     Comments: 2/6 systolic murmur Pulmonary:     Effort: Pulmonary effort is normal. No respiratory distress.     Breath sounds:  Normal breath sounds.  Abdominal:     General: Bowel sounds are normal.     Palpations: Abdomen is soft.     Tenderness: There is no abdominal tenderness.  Musculoskeletal:        General: No swelling or tenderness.  Cervical back: Neck supple. No tenderness.     Comments: Limited rom - left arm/shoulder  Lymphadenopathy:     Cervical: No cervical adenopathy.  Skin:    Findings: No erythema or rash.  Neurological:     Mental Status: He is alert.  Psychiatric:        Mood and Affect: Mood normal.        Behavior: Behavior normal.     Outpatient Encounter Medications as of 08/19/2021  Medication Sig   amLODipine (NORVASC) 5 MG tablet Take 1 tablet (5 mg total) by mouth daily.   allopurinol (ZYLOPRIM) 100 MG tablet TAKE 1 TABLET EVERY DAY   aspirin EC 81 MG tablet Take 1 tablet (81 mg total) by mouth daily.   atorvastatin (LIPITOR) 80 MG tablet TAKE 1 TABLET EVERY MORNING   carvedilol (COREG) 6.25 MG tablet TAKE 1 TABLET TWICE DAILY WITH A MEAL   cyanocobalamin 1000 MCG tablet Take 1,000 mcg by mouth daily.   LORazepam (ATIVAN) 1 MG tablet Takes 1/2 to 1 tablet q day prn   losartan (COZAAR) 100 MG tablet TAKE 1 TABLET EVERY DAY   triamterene-hydrochlorothiazide (DYAZIDE) 37.5-25 MG capsule Take 1 each (1 capsule total) by mouth daily.   Vitamin D3 (VITAMIN D) 25 MCG tablet Take 1,000 Units by mouth daily.   [DISCONTINUED] amLODipine (NORVASC) 10 MG tablet Take 0.5 tablets (5 mg total) by mouth daily.   No facility-administered encounter medications on file as of 08/19/2021.     Lab Results  Component Value Date   WBC 8.0 08/19/2021   HGB 11.6 (L) 08/19/2021   HCT 35.1 (L) 08/19/2021   PLT 182.0 08/19/2021   GLUCOSE 110 (H) 08/19/2021   CHOL 103 06/03/2021   TRIG 49.0 06/03/2021   HDL 41.40 06/03/2021   LDLCALC 52 06/03/2021   ALT 7 06/03/2021   AST 14 06/03/2021   NA 138 08/19/2021   K 3.5 08/19/2021   CL 102 08/19/2021   CREATININE 1.48 08/19/2021   BUN 22  08/19/2021   CO2 26 08/19/2021   TSH 8.75 (H) 08/19/2021   PSA 4.15 (H) 02/08/2014   INR 1.7 (H) 01/25/2019   HGBA1C 5.6 06/03/2021   MICROALBUR 265.0 (H) 06/03/2021    MR SHOULDER LEFT WO CONTRAST  Result Date: 03/22/2021 CLINICAL DATA:  Limited range of motion of the left arm since having shingles. No injury or prior surgery. EXAM: MRI OF THE LEFT SHOULDER WITHOUT CONTRAST TECHNIQUE: Multiplanar, multisequence MR imaging of the shoulder was performed. No intravenous contrast was administered. COMPARISON:  None. FINDINGS: Rotator cuff: Mild supraspinatus tendinosis with low-grade partial-thickness bursal surface tear anteriorly at the insertion. The infraspinatus, teres minor, and subscapularis tendons are intact. Muscles: Mild diffuse muscle edema involving the supraspinatus, infraspinatus, teres minor, and deltoid muscles. Mild diffuse rotator cuff muscle fatty infiltration without frank atrophy. Biceps long head:  Intact and normally positioned. Acromioclavicular Joint: Mild arthropathy of the acromioclavicular joint. Type I acromion. Trace subacromial/subdeltoid bursal fluid. Glenohumeral Joint: Small joint effusion. Extensive full-thickness cartilage loss over the humeral head and glenoid with subchondral marrow edema and cystic change. Labrum:  Diffusely degenerated. Bones: No acute fracture or dislocation. No suspicious bone lesion. Other: None. IMPRESSION: 1. Mild diffuse muscle edema involving the supraspinatus, infraspinatus, teres minor, and deltoid muscles. Findings are suggestive of acute denervation (brachial neuritis). 2. Mild supraspinatus tendinosis with low-grade partial-thickness bursal surface tear anteriorly at the insertion. 3. 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 Electronically Signed   By: Titus Dubin  M.D.   On: 03/22/2021 10:45       Assessment & Plan:   Problem List Items Addressed This Visit     Anemia    Follow cbc.       Chronic HFrEF  (heart failure with reduced ejection fraction) (HCC)    Continue losartan and coreg.  Recently started back on triam/hctz.  Swelling is better.  Discussed continuing to monitor salt intake.  Leg elevation.  Check metabolic panel.        Relevant Medications   amLODipine (NORVASC) 5 MG tablet   CKD (chronic kidney disease) stage 3, GFR 30-59 ml/min (HCC)    Avoid antiinflammatories.  Stay hydrated.  Follow metabolic panel.  Continue losartan. Back on triam/hctz.  Previous elevation in creatinine with diuretic.  Check metabolic panel today.       Diabetes ([place])    On no medication.  Stay hydrated.  Monitor sugars. Follow met b and a1c.       Dilatation of thoracic aorta (HCC)    S/p surgery.  Continues to be followed by cardiology - monitoring.       Relevant Medications   amLODipine (NORVASC) 5 MG tablet   Elevated TSH    Recheck today.       Relevant Orders   T4, free (Completed)   TSH (Completed)   Hyperlipidemia LDL goal <70    Continue lipitor.  Low cholesterol diet and exercise.  Follow lipid panel and liver function tests.       Relevant Medications   amLODipine (NORVASC) 5 MG tablet   Hypertension - Primary    Continue losartan, amlodipine and Coreg.  Amlodipine recently decreased to $RemoveBefo'5mg'MPwYxIKjwpZ$  q day.  Started back in triam/hctz. Blood pressure as outlined.   Follow blood pressure. Follow metabolic panel.  Check today.  Unable to increased coreg - given pulse rate.        Relevant Medications   amLODipine (NORVASC) 5 MG tablet   Other Relevant Orders   Basic metabolic panel (Completed)   CBC with Differential/Platelet (Completed)     Einar Pheasant, MD

## 2021-08-20 ENCOUNTER — Encounter: Payer: Self-pay | Admitting: Internal Medicine

## 2021-08-20 ENCOUNTER — Other Ambulatory Visit: Payer: Self-pay | Admitting: Internal Medicine

## 2021-08-20 DIAGNOSIS — I5022 Chronic systolic (congestive) heart failure: Secondary | ICD-10-CM

## 2021-08-20 DIAGNOSIS — D649 Anemia, unspecified: Secondary | ICD-10-CM

## 2021-08-20 DIAGNOSIS — N1831 Chronic kidney disease, stage 3a: Secondary | ICD-10-CM

## 2021-08-20 NOTE — Assessment & Plan Note (Signed)
Continue losartan and coreg.  Recently started back on triam/hctz.  Swelling is better.  Discussed continuing to monitor salt intake.  Leg elevation.  Check metabolic panel.   ?

## 2021-08-20 NOTE — Assessment & Plan Note (Signed)
On no medication.  Stay hydrated.  Monitor sugars. Follow met b and a1c.  ?

## 2021-08-20 NOTE — Progress Notes (Signed)
Orders placed for f/u labs.  

## 2021-08-20 NOTE — Assessment & Plan Note (Signed)
S/p surgery.  Continues to be followed by cardiology - monitoring.  

## 2021-08-20 NOTE — Assessment & Plan Note (Signed)
Continue losartan, amlodipine and Coreg.  Amlodipine recently decreased to 5mg  q day.  Started back in triam/hctz. Blood pressure as outlined.   Follow blood pressure. Follow metabolic panel.  Check today.  Unable to increased coreg - given pulse rate.   ?

## 2021-08-20 NOTE — Assessment & Plan Note (Signed)
Avoid antiinflammatories.  Stay hydrated.  Follow metabolic panel.  Continue losartan. Back on triam/hctz.  Previous elevation in creatinine with diuretic.  Check metabolic panel today.  ?

## 2021-08-20 NOTE — Assessment & Plan Note (Signed)
Continue lipitor.  Low cholesterol diet and exercise.  Follow lipid panel and liver function tests.   

## 2021-08-20 NOTE — Assessment & Plan Note (Signed)
Recheck today. 

## 2021-08-20 NOTE — Assessment & Plan Note (Signed)
Follow cbc.  

## 2021-08-21 ENCOUNTER — Other Ambulatory Visit: Payer: Self-pay

## 2021-08-21 MED ORDER — LEVOTHYROXINE SODIUM 50 MCG PO TABS: 50.0000 ug | ORAL_TABLET | Freq: Every day | ORAL | 3 refills | Status: DC

## 2021-08-28 ENCOUNTER — Telehealth: Payer: Self-pay | Admitting: *Deleted

## 2021-08-28 NOTE — Telephone Encounter (Signed)
Called pt, notified of lab results per Dr. Darnelle Bos review.  ?Pt voiced understanding. No questions at this time.  ?

## 2021-08-28 NOTE — Telephone Encounter (Signed)
-----   Message from Nelva Bush, MD sent at 08/21/2021  3:07 PM EST ----- ?Regarding: RE: f/u labs ?Thanks for the heads up.  I reviewed his labs.  He had a small bump in is creatinine, which his PCP will continue to watch.  He should continue his current meds. ? ?[name] [name] ? ?----- Message ----- ?From: Solmon Ice, RN ?Sent: 08/21/2021   9:52 AM EST ?To: Nelva Bush, MD ?Subject: FW: f/u labs                                  ? ?Pt had labs done with PCP 08/19/21 in chart.  ?----- Message ----- ?From: Solmon Ice, RN ?Sent: 08/21/2021   8:00 AM EST ?To: Solmon Ice, RN ?Subject: f/u labs                                      ? ?Follow up labs pt will have at PCP (2 weeks after ov 08/06/21) ? ? ? ?

## 2021-08-30 ENCOUNTER — Other Ambulatory Visit: Payer: Self-pay | Admitting: Internal Medicine

## 2021-09-03 DIAGNOSIS — H353131 Nonexudative age-related macular degeneration, bilateral, early dry stage: Secondary | ICD-10-CM | POA: Diagnosis not present

## 2021-09-06 ENCOUNTER — Telehealth: Payer: Self-pay | Admitting: Internal Medicine

## 2021-09-06 NOTE — Telephone Encounter (Signed)
-----   Message from Nelva Bush, MD sent at 08/20/2021  6:48 AM EST ----- ?Regarding: RE: update ?Hi Dr. Nicki Reaper, ? ?Thank you for the update.  I agree with your plan to monitor his kidney function and potentially decrease triamterene-HCTZ if it worsens.  Let me know if any questions or concerns come up. ? ?Chris End ?----- Message ----- ?From: Einar Pheasant, MD ?Sent: 08/20/2021   5:18 AM EST ?To: Nelva Bush, MD ?Subject: update                                        ? ?I saw Mr Petrich in follow up 08/19/21.  He recently saw you for follow up and increased swelling - lower extremities.  His amlodipine was decreased to '5mg'$ .  Started back on triam/hctz.  His swelling is better. (Still some, but better - worse as day progresses).  Weight was down.  Blood pressure ok.  I rechecked his kidney function.  GFR 41 (down from 55 last check - but has varied).  I did not make any changes yet.  I was going to follow and see if kidney function leveled out.  I may consider trying 1/2 of the triam/hctz.  Just wanted to give you and update. If you have any recommendations, just let me know.  Thank you for helping take care of him.  I really appreciate it.   ? ?Teandra Harlan.  ? ? ?

## 2021-09-06 NOTE — Telephone Encounter (Signed)
Per review - last lab result note, it appears that the f/u non fasting lab to check cbc and met b has not been scheduled.  Please schedule this week.  Labs are in.   ?

## 2021-09-07 NOTE — Telephone Encounter (Signed)
Patient scheduled for tomorrow. Does he need all the labs ordered in chart? I believe there is a ferritin & folate? ?

## 2021-09-07 NOTE — Telephone Encounter (Signed)
The labs that he needs are in Epic.  ?

## 2021-09-08 ENCOUNTER — Other Ambulatory Visit (INDEPENDENT_AMBULATORY_CARE_PROVIDER_SITE_OTHER): Payer: Medicare HMO

## 2021-09-08 ENCOUNTER — Other Ambulatory Visit: Payer: Self-pay

## 2021-09-08 DIAGNOSIS — N1831 Chronic kidney disease, stage 3a: Secondary | ICD-10-CM | POA: Diagnosis not present

## 2021-09-08 DIAGNOSIS — D649 Anemia, unspecified: Secondary | ICD-10-CM | POA: Diagnosis not present

## 2021-09-08 DIAGNOSIS — I5022 Chronic systolic (congestive) heart failure: Secondary | ICD-10-CM

## 2021-09-08 LAB — BASIC METABOLIC PANEL
BUN: 31 mg/dL — ABNORMAL HIGH (ref 6–23)
CO2: 21 mEq/L (ref 19–32)
Calcium: 9.8 mg/dL (ref 8.4–10.5)
Chloride: 104 mEq/L (ref 96–112)
Creatinine, Ser: 1.32 mg/dL (ref 0.40–1.50)
GFR: 47.96 mL/min — ABNORMAL LOW (ref >60.00)
Glucose, Bld: 101 mg/dL — ABNORMAL HIGH (ref 70–99)
Potassium: 4.5 mEq/L (ref 3.5–5.1)
Sodium: 135 mEq/L (ref 135–145)

## 2021-09-08 LAB — CBC WITH DIFFERENTIAL/PLATELET
Basophils Absolute: 0.1 10*3/uL (ref 0.0–0.1)
Basophils Relative: 0.9 % (ref 0.0–3.0)
Eosinophils Absolute: 0.6 10*3/uL (ref 0.0–0.7)
Eosinophils Relative: 5.3 % — ABNORMAL HIGH (ref 0.0–5.0)
HCT: 38.3 % — ABNORMAL LOW (ref 39.0–52.0)
Hemoglobin: 12.8 g/dL — ABNORMAL LOW (ref 13.0–17.0)
Lymphocytes Relative: 42.9 % (ref 12.0–46.0)
Lymphs Abs: 4.7 10*3/uL — ABNORMAL HIGH (ref 0.7–4.0)
MCHC: 33.4 g/dL (ref 30.0–36.0)
MCV: 96.1 fl (ref 78.0–100.0)
Monocytes Absolute: 0.8 10*3/uL (ref 0.1–1.0)
Monocytes Relative: 7.7 % (ref 3.0–12.0)
Neutro Abs: 4.7 10*3/uL (ref 1.4–7.7)
Neutrophils Relative %: 43.2 % (ref 43.0–77.0)
Platelets: 165 10*3/uL (ref 150.0–400.0)
RBC: 3.99 Mil/uL — ABNORMAL LOW (ref 4.22–5.81)
RDW: 14.8 % (ref 11.5–15.5)
WBC: 10.9 10*3/uL — ABNORMAL HIGH (ref 4.0–10.5)

## 2021-09-08 LAB — IBC + FERRITIN
Ferritin: 75.6 ng/mL (ref 22.0–322.0)
Iron: 100 ug/dL (ref 42–165)
Saturation Ratios: 27.3 % (ref 20.0–50.0)
TIBC: 366.8 ug/dL (ref 250.0–450.0)
Transferrin: 262 mg/dL (ref 212.0–360.0)

## 2021-09-08 NOTE — Telephone Encounter (Signed)
Noted  

## 2021-09-15 ENCOUNTER — Other Ambulatory Visit: Payer: Medicare HMO

## 2021-09-15 ENCOUNTER — Other Ambulatory Visit: Payer: Self-pay | Admitting: Internal Medicine

## 2021-09-23 ENCOUNTER — Telehealth: Payer: Self-pay | Admitting: Internal Medicine

## 2021-09-23 DIAGNOSIS — I10 Essential (primary) hypertension: Secondary | ICD-10-CM

## 2021-09-23 DIAGNOSIS — E039 Hypothyroidism, unspecified: Secondary | ICD-10-CM

## 2021-09-23 NOTE — Telephone Encounter (Signed)
Patient has a lab appt 09/24/2021, there are no orders in. ?

## 2021-09-24 ENCOUNTER — Other Ambulatory Visit: Payer: Self-pay | Admitting: Internal Medicine

## 2021-09-24 ENCOUNTER — Other Ambulatory Visit (INDEPENDENT_AMBULATORY_CARE_PROVIDER_SITE_OTHER): Payer: Medicare HMO

## 2021-09-24 DIAGNOSIS — E039 Hypothyroidism, unspecified: Secondary | ICD-10-CM

## 2021-09-24 DIAGNOSIS — I10 Essential (primary) hypertension: Secondary | ICD-10-CM

## 2021-09-24 LAB — BASIC METABOLIC PANEL
BUN: 37 mg/dL — ABNORMAL HIGH (ref 6–23)
CO2: 21 mEq/L (ref 19–32)
Calcium: 9.5 mg/dL (ref 8.4–10.5)
Chloride: 105 mEq/L (ref 96–112)
Creatinine, Ser: 1.41 mg/dL (ref 0.40–1.50)
GFR: 44.3 mL/min — ABNORMAL LOW (ref >60.00)
Glucose, Bld: 131 mg/dL — ABNORMAL HIGH (ref 70–99)
Potassium: 4.2 mEq/L (ref 3.5–5.1)
Sodium: 138 mEq/L (ref 135–145)

## 2021-09-24 LAB — TSH: TSH: 1.79 u[IU]/mL (ref 0.35–5.50)

## 2021-09-24 NOTE — Addendum Note (Signed)
Addended by: Alisa Graff on: 09/24/2021 04:13 AM ? ? Modules accepted: Orders ? ?

## 2021-09-24 NOTE — Telephone Encounter (Signed)
Order placed for labs.

## 2021-10-13 ENCOUNTER — Telehealth: Payer: Self-pay | Admitting: Internal Medicine

## 2021-10-13 NOTE — Telephone Encounter (Signed)
Called pt on cellphone... Couldn't LVMTCB... Received message that stated "sorry your call can not be completed at this time". Called pt home phone.... Couldn't LVMTCB.... VMB is full.... Provider will not be in office on Oct 19, 2021....  ?

## 2021-10-18 ENCOUNTER — Other Ambulatory Visit: Payer: Self-pay | Admitting: Internal Medicine

## 2021-10-19 ENCOUNTER — Ambulatory Visit: Payer: Medicare HMO | Admitting: Internal Medicine

## 2021-10-21 ENCOUNTER — Ambulatory Visit (INDEPENDENT_AMBULATORY_CARE_PROVIDER_SITE_OTHER): Payer: Medicare HMO

## 2021-10-21 VITALS — Ht 70.0 in | Wt 228.0 lb

## 2021-10-21 DIAGNOSIS — Z0000 Encounter for general adult medical examination without abnormal findings: Secondary | ICD-10-CM

## 2021-10-21 NOTE — Progress Notes (Signed)
Subjective:   [name] [name] is a 88 y.o. male who presents for Medicare Annual/Subsequent preventive examination.  Review of Systems    No ROS.  Medicare Wellness Virtual Visit.  Visual/audio telehealth visit, UTA vital signs.   See social history for additional risk factors.   Cardiac Risk Factors include: advanced age (>41men, >58 women);male gender;hypertension;diabetes mellitus;smoking/ tobacco exposure     Objective:    Today's Vitals   10/21/21 0901  Weight: 228 lb (103.4 kg)  Height: 5\' 10"  (1.778 m)   Body mass index is 32.71 kg/m.     10/21/2021    9:12 AM 12/25/2020    3:49 PM 10/16/2020    9:49 AM 10/16/2019    9:43 AM 04/18/2019    4:15 PM 01/26/2019    6:00 PM 01/23/2019   11:41 AM  Advanced Directives  Does Patient Have a Medical Advance Directive? No Yes No No No No No  Type of Diplomatic Services operational officer;Living will        Does patient want to make changes to medical advance directive? No - Patient declined        Copy of Healthcare Power of Attorney in Chart? No - copy requested        Would patient like information on creating a medical advance directive?   No - Patient declined No - Patient declined No - Patient declined No - Patient declined     Current Medications (verified) Outpatient Encounter Medications as of 10/21/2021  Medication Sig   allopurinol (ZYLOPRIM) 100 MG tablet TAKE 1 TABLET EVERY DAY   amLODipine (NORVASC) 5 MG tablet Take 1 tablet (5 mg total) by mouth daily.   aspirin EC 81 MG tablet Take 1 tablet (81 mg total) by mouth daily.   atorvastatin (LIPITOR) 80 MG tablet TAKE 1 TABLET EVERY MORNING   carvedilol (COREG) 6.25 MG tablet TAKE 1 TABLET TWICE DAILY WITH A MEAL   cyanocobalamin 1000 MCG tablet Take 1,000 mcg by mouth daily.   levothyroxine (SYNTHROID) 50 MCG tablet Take 1 tablet (50 mcg total) by mouth daily.   LORazepam (ATIVAN) 1 MG tablet Takes 1/2 to 1 tablet q day prn   losartan (COZAAR) 100 MG tablet  TAKE 1 TABLET EVERY DAY   triamterene-hydrochlorothiazide (DYAZIDE) 37.5-25 MG capsule TAKE 1 EACH (1 CAPSULE TOTAL) BY MOUTH DAILY.   Vitamin D3 (VITAMIN D) 25 MCG tablet Take 1,000 Units by mouth daily.   No facility-administered encounter medications on file as of 10/21/2021.    Allergies (verified) Patient has no known allergies.   History: Past Medical History:  Diagnosis Date   Aortic stenosis    Arthritis    right shoulder   BPH (benign prostatic hypertrophy)    Coronary artery disease    Gout    Heart murmur    History of chicken pox    Hypercholesterolemia    Hypertension    Systolic heart failure (HCC)    ischemic cardiomyopathy   Wears dentures    full upper and lower   Past Surgical History:  Procedure Laterality Date   AORTIC VALVE REPLACEMENT N/A 01/25/2019   Procedure: AORTIC VALVE REPLACEMENT (AVR) 25mm Inspiris valve;  Surgeon: Alleen Borne, MD;  Location: [facility] OR;  Service: Open Heart Surgery;  Laterality: N/A;   arm surgery     right arm fx s/p "plate insertion"   CARDIAC CATHETERIZATION     CATARACT EXTRACTION W/PHACO Right 11/17/2016   Procedure: CATARACT EXTRACTION PHACO AND  INTRAOCULAR LENS PLACEMENT (IOC)  Right;  Surgeon: Lockie Mola, MD;  [facility];  Service: Ophthalmology;  Laterality: Right;   CORONARY ARTERY BYPASS GRAFT N/A 01/25/2019   LIMA-LAD and sequential SVG-rPDA-rPL   DENTAL SURGERY     all teeth extracted   RIGHT HEART CATH AND CORONARY ANGIOGRAPHY N/A 01/02/2019   Procedure: RIGHT HEART CATH AND CORONARY ANGIOGRAPHY;  Surgeon: Yvonne Kendall, MD;  Location: [facility] INVASIVE CV LAB;  Service: Cardiovascular;  Laterality: N/A;   TEE WITHOUT CARDIOVERSION N/A 01/25/2019   Procedure: TRANSESOPHAGEAL ECHOCARDIOGRAM (TEE);  Surgeon: Alleen Borne, MD;  Location: [facility] OR;  Service: Open Heart Surgery;  Laterality: N/A;   Family History  Problem Relation Age of Onset   Leukemia Father    Congestive Heart Failure Mother     Cancer Daughter        Breast Cancer   Prostate cancer Neg Hx    Colon cancer Neg Hx    Social History   Socioeconomic History   Marital status: Married    Spouse name: Not on file   Number of children: Not on file   Years of education: Not on file   Highest education level: Not on file  Occupational History   Not on file  Tobacco Use   Smoking status: Never   Smokeless tobacco: Current    Types: Chew   Tobacco comments:    not ready to quit currently  Vaping Use   Vaping Use: Never used  Substance and Sexual Activity   Alcohol use: No    Alcohol/week: 0.0 standard drinks   Drug use: No   Sexual activity: Not Currently  Other Topics Concern   Not on file  Social History Narrative   Not on file   Social Determinants of Health   Financial Resource Strain: Low Risk    Difficulty of Paying Living Expenses: Not hard at all  Food Insecurity: No Food Insecurity   Worried About Programme researcher, broadcasting/film/video in the Last Year: Never true   Ran Out of Food in the Last Year: Never true  Transportation Needs: No Transportation Needs   Lack of Transportation (Medical): No   Lack of Transportation (Non-Medical): No  Physical Activity: Not on file  Stress: No Stress Concern Present   Feeling of Stress : Not at all  Social Connections: Unknown   Frequency of Communication with Friends and Family: Not on file   Frequency of Social Gatherings with Friends and Family: Not on file   Attends Religious Services: Not on file   Active Member of Clubs or Organizations: Not on file   Attends Banker Meetings: Not on file   Marital Status: Married    Tobacco Counseling Ready to quit: Not Answered Counseling given: Not Answered Tobacco comments: not ready to quit currently   Clinical Intake:  Pre-visit preparation completed: Yes        Diabetes: Yes (Followed by PCP)  How often do you need to have someone help you when you read instructions, pamphlets, or other  written materials from your doctor or pharmacy?: 1 - Never    Interpreter Needed?: No      Activities of Daily Living    10/21/2021    9:13 AM  In your present state of health, do you have any difficulty performing the following activities:  Hearing? 0  Vision? 0  Difficulty concentrating or making decisions? 0  Walking or climbing stairs? 0  Dressing or bathing? 0  Doing  errands, shopping? 0  Preparing Food and eating ? N  Using the Toilet? N  In the past six months, have you accidently leaked urine? N  Do you have problems with loss of bowel control? N  Managing your Medications? N  Managing your Finances? N  Housekeeping or managing your Housekeeping? N    Patient Care Team: Dale [place], MD as PCP - General (Internal Medicine) End, Cristal Deer, MD as PCP - Cardiology (Cardiology)  Indicate any recent Medical Services you may have received from other than [facility] providers in the past year (date may be approximate).     Assessment:   This is a routine wellness examination for [place].  Virtual Visit via Telephone Note  I connected with  [name] on 10/21/21 at  9:00 AM EDT by telephone and verified that I am speaking with the correct person using two identifiers.  Persons participating in the virtual visit: patient/Nurse Health Advisor   I discussed the limitations of performing an evaluation and management service by telehealth. The patient expressed understanding and agreed to proceed. We continued and completed visit with audio only. Some vital signs may be absent or patient reported.   Hearing/Vision screen Hearing Screening - Comments:: Patient is able to hear conversational tones without difficulty.  No issues reported.  Vision Screening - Comments:: Followed by [facility] Wears lenses when reading  Cataract extraction, R eye  They have regular follow up with the ophthalmologist  Dietary issues and exercise activities discussed: Current  Exercise Habits: Home exercise routine, Intensity: Mild Healthy diet Good water intake   Goals Addressed             This Visit's Progress    Follow up with Primary Care Provider       As needed.       Depression Screen    10/21/2021    9:08 AM 10/16/2020    9:55 AM 10/08/2020   11:00 AM 10/16/2019    9:44 AM 04/23/2019    1:54 PM 10/09/2018   10:09 AM 09/15/2017    8:34 AM  PHQ 2/9 Scores  PHQ - 2 Score 0 0 0 0 0 0 0  PHQ- 9 Score     1      Fall Risk    10/21/2021    9:13 AM 10/16/2020    9:53 AM 05/27/2020   11:06 AM 10/16/2019    9:44 AM 04/18/2019    4:16 PM  Fall Risk   Falls in the past year? 0 0 0 0 0  Number falls in past yr: 0 0   0  Injury with Fall?  0   0  Follow up Falls evaluation completed Falls evaluation completed Falls evaluation completed Falls evaluation completed     FALL RISK PREVENTION PERTAINING TO THE HOME: Home free of loose throw rugs in walkways, pet beds, electrical cords, etc? Yes  Adequate lighting in your home to reduce risk of falls? Yes   ASSISTIVE DEVICES UTILIZED TO PREVENT FALLS: Life alert? No  Use of a cane, walker or w/c? No   TIMED UP AND GO: Was the test performed? No .   Cognitive Function: Patient is alert and oriented x3.     09/14/2016    8:40 AM 09/15/2015    8:20 AM  MMSE - Mini Mental State Exam  Orientation to time 5 5  Orientation to Place 5 5  Registration 3 3  Attention/ Calculation 5 5  Recall 3 3  Language- name 2 objects 2 2  Language- repeat 1 1  Language- follow 3 step command 3 3  Language- read & follow direction 1 1  Write a sentence 1 1  Copy design 1 1  Total score 30 30        10/21/2021   11:49 AM 10/16/2019    9:48 AM 10/09/2018   10:14 AM 09/15/2017    8:46 AM  6CIT Screen  What Year? 0 points 0 points 0 points 0 points  What month? 0 points 0 points 0 points 0 points  What time? 0 points 0 points 0 points 0 points  Count back from 20 0 points  0 points 0 points  Months in reverse  0 points 0 points 0 points 0 points  Repeat phrase 0 points  0 points 0 points  Total Score 0 points  0 points 0 points    Immunizations Immunization History  Administered Date(s) Administered   Fluad Quad(high Dose 65+) 04/11/2019, 03/05/2020, 04/09/2021   Influenza Split 05/02/2012   Influenza, High Dose Seasonal PF 03/19/2016, 03/18/2017, 05/22/2018   Influenza,inj,Quad PF,6+ Mos 05/01/2013, 02/13/2014, 05/08/2015   PFIZER(Purple Top)SARS-COV-2 Vaccination 07/17/2019, 08/07/2019, 04/10/2020   Pneumococcal Conjugate-13 10/05/2013   Pneumococcal Polysaccharide-23 11/05/2014   Tdap 03/02/2021   Shingrix vaccine- deferred per patient.   Screening Tests Health Maintenance  Topic Date Due   COVID-19 Vaccine (4 - Booster for Pfizer series) 11/06/2021 (Originally 06/05/2020)   Zoster Vaccines- Shingrix (1 of 2) 01/21/2022 (Originally 08/09/1949)   HEMOGLOBIN A1C  12/02/2021   FOOT EXAM  01/08/2022   INFLUENZA VACCINE  01/19/2022   OPHTHALMOLOGY EXAM  09/03/2022   TETANUS/TDAP  03/03/2031   Pneumonia Vaccine 70+ Years old  Completed   HPV VACCINES  Aged Out   Health Maintenance There are no preventive care reminders to display for this patient.  Hepatitis C Screening: does not qualify.  Vision Screening: Recommended annual ophthalmology exams for early detection of glaucoma and other disorders of the eye.  Dental Screening: Recommended annual dental exams for proper oral hygiene  Community Resource Referral / Chronic Care Management: CRR required this visit?  No   CCM required this visit?  No      Plan:   Keep all routine maintenance appointments.   I have personally reviewed and noted the following in the patient's chart:   Medical and social history Use of alcohol, tobacco or illicit drugs  Current medications and supplements including opioid prescriptions. Patient is not currently taking opioid prescriptions. Functional ability and status Nutritional  status Physical activity Advanced directives List of other physicians Hospitalizations, surgeries, and ER visits in previous 12 months Vitals Screenings to include cognitive, depression, and falls Referrals and appointments  In addition, I have reviewed and discussed with patient certain preventive protocols, quality metrics, and best practice recommendations. A written personalized care plan for preventive services as well as general preventive health recommendations were provided to patient.     Ashok Pall, LPN   06/26/1094

## 2021-10-21 NOTE — Patient Instructions (Addendum)
?  [name] [name] , ?Thank you for taking time to come for your Medicare Wellness Visit. I appreciate your ongoing commitment to your health goals. Please review the following plan we discussed and let me know if I can assist you in the future.  ? ?These are the goals we discussed: ? Goals   ? ?  Follow up with Primary Care Provider   ?  As needed. ?  ? ?  ?  ?This is a list of the screening recommended for you and due dates:  ?Health Maintenance  ?Topic Date Due  ? COVID-19 Vaccine (4 - Booster for Pfizer series) 11/06/2021*  ? Zoster (Shingles) Vaccine (1 of 2) 01/21/2022*  ? Hemoglobin A1C  12/02/2021  ? Complete foot exam   01/08/2022  ? Flu Shot  01/19/2022  ? Eye exam for diabetics  09/03/2022  ? Tetanus Vaccine  03/03/2031  ? Pneumonia Vaccine  Completed  ? HPV Vaccine  Aged Out  ?*Topic was postponed. The date shown is not the original due date.  ?  ?

## 2021-11-05 ENCOUNTER — Encounter: Payer: Self-pay | Admitting: Internal Medicine

## 2021-11-05 ENCOUNTER — Ambulatory Visit: Payer: Medicare HMO | Admitting: Internal Medicine

## 2021-11-05 VITALS — BP 136/80 | HR 55 | Ht 70.0 in | Wt 217.0 lb

## 2021-11-05 DIAGNOSIS — R5383 Other fatigue: Secondary | ICD-10-CM | POA: Diagnosis not present

## 2021-11-05 DIAGNOSIS — I2510 Atherosclerotic heart disease of native coronary artery without angina pectoris: Secondary | ICD-10-CM

## 2021-11-05 DIAGNOSIS — I5022 Chronic systolic (congestive) heart failure: Secondary | ICD-10-CM | POA: Diagnosis not present

## 2021-11-05 DIAGNOSIS — I10 Essential (primary) hypertension: Secondary | ICD-10-CM

## 2021-11-05 DIAGNOSIS — Z952 Presence of prosthetic heart valve: Secondary | ICD-10-CM | POA: Diagnosis not present

## 2021-11-05 DIAGNOSIS — I350 Nonrheumatic aortic (valve) stenosis: Secondary | ICD-10-CM | POA: Diagnosis not present

## 2021-11-05 DIAGNOSIS — E785 Hyperlipidemia, unspecified: Secondary | ICD-10-CM | POA: Diagnosis not present

## 2021-11-05 MED ORDER — HYDROCHLOROTHIAZIDE 12.5 MG PO CAPS: 12.5000 mg | ORAL_CAPSULE | Freq: Every day | ORAL | 2 refills | Status: DC

## 2021-11-05 NOTE — Progress Notes (Signed)
Follow-up Outpatient Visit Date: 11/05/2021  Primary Care Provider: Einar [name], [place] [address] [place] [address]  Chief Complaint: Fatigue  HPI:  [name] is a 88 y.o. male with history of coronary artery disease and systolic heart failure secondary to ischemic cardiomyopathy complicated by severe aortic stenosis status post CABG (LIMA to LAD and sequential SVG to RPDA and RPL) and bioprosthetic aortic valve replacement on 01/25/2019, hypertension, hyperlipidemia, diabetes mellitus, asthma, arthritis, gout, and BPH, who presents for follow-up of coronary artery disease, heart failure, and valvular heart disease.  I last saw him in February, at which time [name] was concerned about a several month history of leg swelling.  He initially thought edema might be due to gabapentin, though swelling persisted after he stopped this medication.  He also reported mild shortness of breath that he attributed to a URI.  He was previously on triamterene-HCTZ, though this was held around 2021 due to a transient bump in his creatinine.  We agreed to decrease amlodipine and restart triamterene-HCTZ.  Follow-up labs showed stable renal function and electrolytes.  Swelling appeared to be better when he followed up with Dr. Nicki [name] in early March.  Today, [name] reports that he is feeling a bit fatigued.  This started a few weeks ago.  He wonders if it could be related to triamterene-HCTZ or fexofenadine.  He notes that his blood pressure has been well controlled.  His leg swelling also resolved with reinitiation of triamterene-HCTZ.  He denies chest pain, shortness of breath, palpitations, and lightheadedness.  --------------------------------------------------------------------------------------------------  Past Medical History:  Diagnosis Date   Aortic stenosis    Arthritis    right shoulder   BPH (benign prostatic hypertrophy)    Coronary artery disease    Gout     Heart murmur    History of chicken pox    Hypercholesterolemia    Hypertension    Systolic heart failure (HCC)    ischemic cardiomyopathy   Wears dentures    full upper and lower   Past Surgical History:  Procedure Laterality Date   AORTIC VALVE REPLACEMENT N/A 01/25/2019   Procedure: AORTIC VALVE REPLACEMENT (AVR) 68m Inspiris valve;  Surgeon: BGaye Pollack MD;  Location: [facility] OR;  Service: Open Heart Surgery;  Laterality: N/A;   arm surgery     right arm fx s/p "plate insertion"   CARDIAC CATHETERIZATION     CATARACT EXTRACTION W/PHACO Right 11/17/2016   Procedure: CATARACT EXTRACTION PHACO AND INTRAOCULAR LENS PLACEMENT ([place]  Right;  Surgeon: BLeandrew Koyanagi MD;  [facility]  Service: Ophthalmology;  Laterality: Right;   CORONARY ARTERY BYPASS GRAFT N/A 01/25/2019   LIMA-LAD and sequential SVG-rPDA-rPL   DENTAL SURGERY     all teeth extracted   RIGHT HEART CATH AND CORONARY ANGIOGRAPHY N/A 01/02/2019   Procedure: RIGHT HEART CATH AND CORONARY ANGIOGRAPHY;  Surgeon: ENelva Bush MD;  [facility];  Service: Cardiovascular;  Laterality: N/A;   TEE WITHOUT CARDIOVERSION N/A 01/25/2019   Procedure: TRANSESOPHAGEAL ECHOCARDIOGRAM (TEE);  Surgeon: BGaye Pollack MD;  [facility]  Service: Open Heart Surgery;  Laterality: N/A;    Current Meds  Medication Sig   allopurinol (ZYLOPRIM) 100 MG tablet TAKE 1 TABLET EVERY DAY   amLODipine (NORVASC) 5 MG tablet Take 1 tablet (5 mg total) by mouth daily.   aspirin EC 81 MG tablet Take 1 tablet (81 mg total) by mouth daily.   atorvastatin (LIPITOR) 80 MG  tablet TAKE 1 TABLET EVERY MORNING   carvedilol (COREG) 6.25 MG tablet TAKE 1 TABLET TWICE DAILY WITH A MEAL   cyanocobalamin 1000 MCG tablet Take 1,000 mcg by mouth daily.   Fexofenadine HCl (ALLEGRA ALLERGY PO) Take 1 tablet by mouth daily.   levothyroxine (SYNTHROID) 50 MCG tablet Take 1 tablet (50 mcg total) by mouth daily.   LORazepam  (ATIVAN) 1 MG tablet Takes 1/2 to 1 tablet q day prn   losartan (COZAAR) 100 MG tablet TAKE 1 TABLET EVERY DAY   Vitamin D3 (VITAMIN D) 25 MCG tablet Take 1,000 Units by mouth daily.    Allergies: Patient has no known allergies.  Social History   Tobacco Use   Smoking status: Never   Smokeless tobacco: Current    Types: Chew   Tobacco comments:    not ready to quit currently  Vaping Use   Vaping Use: Never used  Substance Use Topics   Alcohol use: No    Alcohol/week: 0.0 standard drinks   Drug use: No    Family History  Problem Relation Age of Onset   Leukemia Father    Congestive Heart Failure Mother    Cancer Daughter        Breast Cancer   Prostate cancer Neg Hx    Colon cancer Neg Hx     Review of Systems: A 12-system review of systems was performed and was negative except as noted in the HPI.  --------------------------------------------------------------------------------------------------  Physical Exam: BP 136/80 (BP Location: Right Arm, Patient Position: Sitting, Cuff Size: Large)   Pulse (!) 55   Ht '5\' 10"'$  (1.778 m)   Wt 217 lb (98.4 kg)   SpO2 98%   BMI 31.14 kg/m   General:  NAD. Neck: No JVD or HJR. Lungs: Clear to auscultation bilaterally without wheezes or crackles. Heart: Bradycardic but regular with 2/6 systolic murmur.  No rubs or gallops. Abdomen: Soft, nontender, nondistended. Extremities: No lower extremity edema.  Lab Results  Component Value Date   WBC 10.9 (H) 09/08/2021   HGB 12.8 (L) 09/08/2021   HCT 38.3 (L) 09/08/2021   MCV 96.1 09/08/2021   PLT 165.0 09/08/2021    Lab Results  Component Value Date   NA 138 09/24/2021   K 4.2 09/24/2021   CL 105 09/24/2021   CO2 21 09/24/2021   BUN 37 (H) 09/24/2021   CREATININE 1.41 09/24/2021   GLUCOSE 131 (H) 09/24/2021   ALT 7 06/03/2021    Lab Results  Component Value Date   CHOL 103 06/03/2021   HDL 41.40 06/03/2021   LDLCALC 52 06/03/2021   TRIG 49.0 06/03/2021    CHOLHDL 2 06/03/2021    --------------------------------------------------------------------------------------------------  ASSESSMENT AND PLAN: Fatigue: Symptoms are nonspecific.  Certainly antihistamine effects from fexofenadine could be contributing.  I have encouraged [name] to try to stop using this medicine if his seasonal allergies allow.  He also remains concerned that triamterene-HCTZ is contributing and would like to de-escalate the dose.  We have therefore decided to stop this medication altogether and start HCTZ 12.5 mg daily.  He should have a BMP repeated when he sees Dr. Nicki [name] next month to ensure stable potassium and renal function.  If symptoms persist, de-escalation of carvedilol may also need to be considered given mild bradycardia noted today.  Coronary artery disease: No angina reported.  Fatigue is nonspecific and not clearly indicative of coronary insufficiency though if symptoms progress, we may need to consider repeat ischemia evaluation.  We will  continue his current medications for secondary prevention.  HFrEF with recovered ejection fraction: [name] has NYHA class II-III symptoms with interval resolution of leg swelling after dose reduction of amlodipine and reinitiation of triamterene-HCTZ.  Continue current doses of carvedilol and losartan for now, though if fatigue and bradycardia persist, carvedilol will have to be reduced.  Aortic stenosis status post bioprosthetic aortic valve: No overt signs of heart failure.  Fatigue could be contributing, though echocardiogram last year showed preserved LVEF and normal function of the bioprosthesis in the aortic position.  If his symptoms were to worsen, we need to consider repeat echo.  However, we will defer testing for now.  [name] should continue aspirin as well as SBE prophylaxis.  Hypertension: Blood pressure upper normal today.  We have agreed to discontinue triamterene-HCTZ and restart HCTZ 12.5 mg daily  alone.  [name] should monitor his blood pressure at home, if possible, to ensure that it does not trend up.  Hyperlipidemia: Lipids well controlled on last check in 05/2021.  We will plan to continue atorvastatin 80 mg daily.  Follow-up: Return to clinic in 6 months.  Nelva Bush, MD 11/05/2021 3:57 PM

## 2021-11-05 NOTE — Patient Instructions (Signed)
Medication Instructions:   Your physician has recommended you make the following change in your medication:   STOP Triamterene / HCTZ  START Hydrochlorothiazide (HCTZ) 12.5 mg daily   Come off of your Allegra when you are able to; when allergies will allow  *If you need a refill on your cardiac medications before your next appointment, please call your pharmacy*   Lab Work:  None ordered  Testing/Procedures:  None ordered   Follow-Up: At [facility], you and your health needs are our priority.  As part of our continuing mission to provide you with exceptional heart care, we have created designated Provider Care Teams.  These Care Teams include your primary Cardiologist (physician) and Advanced Practice Providers (APPs -  Physician Assistants and Nurse Practitioners) who all work together to provide you with the care you need, when you need it.  We recommend signing up for the patient portal called "MyChart".  Sign up information is provided on this After Visit Summary.  MyChart is used to connect with patients for Virtual Visits (Telemedicine).  Patients are able to view lab/test results, encounter notes, upcoming appointments, etc.  Non-urgent messages can be sent to your provider as well.   To learn more about what you can do with MyChart, go to [email or web address].    Your next appointment:   6 month(s)  The format for your next appointment:   In Person  Provider:   You may see Nelva Bush, MD or one of the following Advanced Practice Providers on your designated Care Team:   Murray Hodgkins, NP Christell Faith, PA-C Cadence Kathlen Mody, PA-C{   Important Information About Sugar

## 2021-11-06 ENCOUNTER — Encounter: Payer: Self-pay | Admitting: Internal Medicine

## 2021-12-01 ENCOUNTER — Encounter: Payer: Self-pay | Admitting: Internal Medicine

## 2021-12-01 ENCOUNTER — Ambulatory Visit (INDEPENDENT_AMBULATORY_CARE_PROVIDER_SITE_OTHER): Payer: Medicare HMO | Admitting: Internal Medicine

## 2021-12-01 VITALS — BP 138/70 | HR 67 | Temp 98.3°F | Resp 17 | Ht 70.0 in | Wt 221.6 lb

## 2021-12-01 DIAGNOSIS — I350 Nonrheumatic aortic (valve) stenosis: Secondary | ICD-10-CM

## 2021-12-01 DIAGNOSIS — I2510 Atherosclerotic heart disease of native coronary artery without angina pectoris: Secondary | ICD-10-CM | POA: Diagnosis not present

## 2021-12-01 DIAGNOSIS — E1159 Type 2 diabetes mellitus with other circulatory complications: Secondary | ICD-10-CM | POA: Diagnosis not present

## 2021-12-01 DIAGNOSIS — R7989 Other specified abnormal findings of blood chemistry: Secondary | ICD-10-CM

## 2021-12-01 DIAGNOSIS — I77810 Thoracic aortic ectasia: Secondary | ICD-10-CM | POA: Diagnosis not present

## 2021-12-01 DIAGNOSIS — I10 Essential (primary) hypertension: Secondary | ICD-10-CM | POA: Diagnosis not present

## 2021-12-01 DIAGNOSIS — I5022 Chronic systolic (congestive) heart failure: Secondary | ICD-10-CM | POA: Diagnosis not present

## 2021-12-01 DIAGNOSIS — N1831 Chronic kidney disease, stage 3a: Secondary | ICD-10-CM

## 2021-12-01 DIAGNOSIS — D649 Anemia, unspecified: Secondary | ICD-10-CM | POA: Diagnosis not present

## 2021-12-01 DIAGNOSIS — E785 Hyperlipidemia, unspecified: Secondary | ICD-10-CM

## 2021-12-01 MED ORDER — LORAZEPAM 1 MG PO TABS: ORAL_TABLET | ORAL | 1 refills | Status: DC

## 2021-12-01 NOTE — Progress Notes (Unsigned)
Patient ID: BODEY FRIZELL, male   DOB: 09-10-1930, 88 y.o.   MRN: [id number]   Subjective:    Patient ID: [name], male    DOB: 1930/07/06, 88 y.o.   MRN: [id number]  This visit occurred during the SARS-CoV-2 public health emergency.  Safety protocols were in place, including screening questions prior to the visit, additional usage of staff PPE, and extensive cleaning of exam room while observing appropriate contact time as indicated for disinfecting solutions.   Patient here for  Chief Complaint  Patient presents with   Hypertension   .   HPI    Past Medical History:  Diagnosis Date   Aortic stenosis    Arthritis    right shoulder   BPH (benign prostatic hypertrophy)    Coronary artery disease    Gout    Heart murmur    History of chicken pox    Hypercholesterolemia    Hypertension    Systolic heart failure (HCC)    ischemic cardiomyopathy   Wears dentures    full upper and lower   Past Surgical History:  Procedure Laterality Date   AORTIC VALVE REPLACEMENT N/A 01/25/2019   Procedure: AORTIC VALVE REPLACEMENT (AVR) 106m Inspiris valve;  Surgeon: BGaye Pollack MD;  [facility];  Service: Open Heart Surgery;  Laterality: N/A;   arm surgery     right arm fx s/p "plate insertion"   CARDIAC CATHETERIZATION     CATARACT EXTRACTION W/PHACO Right 11/17/2016   Procedure: CATARACT EXTRACTION PHACO AND INTRAOCULAR LENS PLACEMENT ([place]  Right;  Surgeon: BLeandrew Koyanagi MD;  [place]  Service: Ophthalmology;  Laterality: Right;   CORONARY ARTERY BYPASS GRAFT N/A 01/25/2019   LIMA-LAD and sequential SVG-rPDA-rPL   DENTAL SURGERY     all teeth extracted   RIGHT HEART CATH AND CORONARY ANGIOGRAPHY N/A 01/02/2019   Procedure: RIGHT HEART CATH AND CORONARY ANGIOGRAPHY;  Surgeon: ENelva Bush MD;  [facility];  Service: Cardiovascular;  Laterality: N/A;   TEE WITHOUT CARDIOVERSION N/A 01/25/2019   Procedure:  TRANSESOPHAGEAL ECHOCARDIOGRAM (TEE);  Surgeon: BGaye Pollack MD;  [place]  Service: Open Heart Surgery;  Laterality: N/A;   Family History  Problem Relation Age of Onset   Leukemia Father    Congestive Heart Failure Mother    Cancer Daughter        Breast Cancer   Prostate cancer Neg Hx    Colon cancer Neg Hx    Social History   Socioeconomic History   Marital status: Married    Spouse name: Not on file   Number of children: Not on file   Years of education: Not on file   Highest education level: Not on file  Occupational History   Not on file  Tobacco Use   Smoking status: Never   Smokeless tobacco: Current    Types: Chew   Tobacco comments:    not ready to quit currently  Vaping Use   Vaping Use: Never used  Substance and Sexual Activity   Alcohol use: No    Alcohol/week: 0.0 standard drinks of alcohol   Drug use: No   Sexual activity: Not Currently  Other Topics Concern   Not on file  Social History Narrative   Not on file   Social Determinants of Health   Financial Resource Strain: Low Risk  (10/21/2021)   Overall Financial Resource Strain (CARDIA)    Difficulty of Paying Living Expenses: Not hard at  all  Food Insecurity: No Food Insecurity (10/21/2021)   Hunger Vital Sign    Worried About Running Out of Food in the Last Year: Never true    Ran Out of Food in the Last Year: Never true  Transportation Needs: No Transportation Needs (10/21/2021)   PRAPARE - Hydrologist (Medical): No    Lack of Transportation (Non-Medical): No  Physical Activity: Unknown (09/15/2017)   Exercise Vital Sign    Days of Exercise per Week: 0 days    Minutes of Exercise per Session: Not on file  Stress: No Stress Concern Present (10/21/2021)   [place]    Feeling of Stress : Not at all  Social Connections: Unknown (10/21/2021)   Social Connection and Isolation Panel [NHANES]     Frequency of Communication with Friends and Family: Not on file    Frequency of Social Gatherings with Friends and Family: Not on file    Attends Religious Services: Not on file    Active Member of Clubs or Organizations: Not on file    Attends Music therapist: Not on file    Marital Status: Married     Review of Systems     Objective:     BP 138/70 (BP Location: Left Arm, Patient Position: Sitting, Cuff Size: Large)   Pulse 67   Temp 98.3 F (36.8 C) (Temporal)   Resp 17   Ht '5\' 10"'$  (1.778 m)   Wt 221 lb 9.6 oz (100.5 kg)   SpO2 95%   BMI 31.80 kg/m  Wt Readings from Last 3 Encounters:  12/01/21 221 lb 9.6 oz (100.5 kg)  11/05/21 217 lb (98.4 kg)  10/21/21 228 lb (103.4 kg)    Physical Exam   Outpatient Encounter Medications as of 12/01/2021  Medication Sig   allopurinol (ZYLOPRIM) 100 MG tablet TAKE 1 TABLET EVERY DAY   amLODipine (NORVASC) 5 MG tablet Take 1 tablet (5 mg total) by mouth daily.   aspirin EC 81 MG tablet Take 1 tablet (81 mg total) by mouth daily.   atorvastatin (LIPITOR) 80 MG tablet TAKE 1 TABLET EVERY MORNING   carvedilol (COREG) 6.25 MG tablet TAKE 1 TABLET TWICE DAILY WITH A MEAL   cyanocobalamin 1000 MCG tablet Take 1,000 mcg by mouth daily.   Fexofenadine HCl (ALLEGRA ALLERGY PO) Take 1 tablet by mouth daily.   hydrochlorothiazide (MICROZIDE) 12.5 MG capsule Take 1 capsule (12.5 mg total) by mouth daily.   levothyroxine (SYNTHROID) 50 MCG tablet Take 1 tablet (50 mcg total) by mouth daily.   LORazepam (ATIVAN) 1 MG tablet Takes 1/2 to 1 tablet q day prn   losartan (COZAAR) 100 MG tablet TAKE 1 TABLET EVERY DAY   Vitamin D3 (VITAMIN D) 25 MCG tablet Take 1,000 Units by mouth daily.   No facility-administered encounter medications on file as of 12/01/2021.     Lab Results  Component Value Date   WBC 10.9 (H) 09/08/2021   HGB 12.8 (L) 09/08/2021   HCT 38.3 (L) 09/08/2021   PLT 165.0 09/08/2021   GLUCOSE 131 (H) 09/24/2021    CHOL 103 06/03/2021   TRIG 49.0 06/03/2021   HDL 41.40 06/03/2021   LDLCALC 52 06/03/2021   ALT 7 06/03/2021   AST 14 06/03/2021   NA 138 09/24/2021   K 4.2 09/24/2021   CL 105 09/24/2021   CREATININE 1.41 09/24/2021   BUN 37 (H) 09/24/2021   CO2 21  09/24/2021   TSH 1.79 09/24/2021   PSA 4.15 (H) 02/08/2014   INR 1.7 (H) 01/25/2019   HGBA1C 5.6 06/03/2021   MICROALBUR 265.0 (H) 06/03/2021    MR SHOULDER LEFT WO CONTRAST  Result Date: 03/22/2021 CLINICAL DATA:  Limited range of motion of the left arm since having shingles. No injury or prior surgery. EXAM: MRI OF THE LEFT SHOULDER WITHOUT CONTRAST TECHNIQUE: Multiplanar, multisequence MR imaging of the shoulder was performed. No intravenous contrast was administered. COMPARISON:  None. FINDINGS: Rotator cuff: Mild supraspinatus tendinosis with low-grade partial-thickness bursal surface tear anteriorly at the insertion. The infraspinatus, teres minor, and subscapularis tendons are intact. Muscles: Mild diffuse muscle edema involving the supraspinatus, infraspinatus, teres minor, and deltoid muscles. Mild diffuse rotator cuff muscle fatty infiltration without frank atrophy. Biceps long head:  Intact and normally positioned. Acromioclavicular Joint: Mild arthropathy of the acromioclavicular joint. Type I acromion. Trace subacromial/subdeltoid bursal fluid. Glenohumeral Joint: Small joint effusion. Extensive full-thickness cartilage loss over the humeral head and glenoid with subchondral marrow edema and cystic change. Labrum:  Diffusely degenerated. Bones: No acute fracture or dislocation. No suspicious bone lesion. Other: None. IMPRESSION: 1. Mild diffuse muscle edema involving the supraspinatus, infraspinatus, teres minor, and deltoid muscles. Findings are suggestive of acute denervation (brachial neuritis). 2. Mild supraspinatus tendinosis with low-grade partial-thickness bursal surface tear anteriorly at the insertion. 3. Severe glenohumeral and  mild acromioclavicular osteoarthritis. Electronically Signed   By: Titus Dubin M.D.   On: 03/22/2021 10:45       Assessment & Plan:   Problem List Items Addressed This Visit     Chronic HFrEF (heart failure with reduced ejection fraction) ([place]) - Primary   Relevant Orders   Basic Metabolic Panel (BMET)     Einar Pheasant, MD

## 2021-12-02 LAB — BASIC METABOLIC PANEL
BUN: 19 mg/dL (ref 6–23)
CO2: 26 mEq/L (ref 19–32)
Calcium: 9.4 mg/dL (ref 8.4–10.5)
Chloride: 104 mEq/L (ref 96–112)
Creatinine, Ser: 1.27 mg/dL (ref 0.40–1.50)
GFR: 50.16 mL/min — ABNORMAL LOW (ref >60.00)
Glucose, Bld: 147 mg/dL — ABNORMAL HIGH (ref 70–99)
Potassium: 3.8 mEq/L (ref 3.5–5.1)
Sodium: 137 mEq/L (ref 135–145)

## 2021-12-02 LAB — TSH: TSH: 3.23 u[IU]/mL (ref 0.35–5.50)

## 2021-12-13 ENCOUNTER — Encounter: Payer: Self-pay | Admitting: Internal Medicine

## 2021-12-13 NOTE — Assessment & Plan Note (Signed)
Continue losartan and coreg.  Recently started on hctz.  Swelling is better.  Discussed continuing to monitor salt intake.  Leg elevation.  Check metabolic panel.  Weight daily.

## 2021-12-13 NOTE — Assessment & Plan Note (Signed)
On no medication.  Stay hydrated.  Monitor sugars. Follow met b and a1c.  ?

## 2021-12-13 NOTE — Assessment & Plan Note (Signed)
Follow cbc.  

## 2021-12-13 NOTE — Assessment & Plan Note (Signed)
S/p surgery.  Continues to be followed by cardiology - monitoring.  

## 2021-12-13 NOTE — Assessment & Plan Note (Signed)
Continue coreg, losartan and lipitor.  No chest pain.  Continue risk factor modification.  

## 2021-12-13 NOTE — Assessment & Plan Note (Signed)
Continue losartan, amlodipine and Coreg.  Amlodipine recently decreased to 5mg  q day.  Started on hctz.  Blood pressure as outlined.   Follow blood pressure. Follow metabolic panel.  Check today.

## 2021-12-21 ENCOUNTER — Other Ambulatory Visit: Payer: Self-pay | Admitting: Internal Medicine

## 2022-01-20 ENCOUNTER — Other Ambulatory Visit: Payer: Self-pay | Admitting: Internal Medicine

## 2022-01-28 ENCOUNTER — Other Ambulatory Visit (INDEPENDENT_AMBULATORY_CARE_PROVIDER_SITE_OTHER): Payer: Medicare HMO

## 2022-01-28 DIAGNOSIS — E785 Hyperlipidemia, unspecified: Secondary | ICD-10-CM

## 2022-01-28 DIAGNOSIS — E1159 Type 2 diabetes mellitus with other circulatory complications: Secondary | ICD-10-CM

## 2022-01-28 DIAGNOSIS — D649 Anemia, unspecified: Secondary | ICD-10-CM | POA: Diagnosis not present

## 2022-01-28 LAB — LIPID PANEL
Cholesterol: 105 mg/dL (ref 0–200)
HDL: 39.2 mg/dL (ref >39.00)
LDL Cholesterol: 56 mg/dL (ref 0–99)
NonHDL: 65.37
Total CHOL/HDL Ratio: 3
Triglycerides: 49 mg/dL (ref 0.0–149.0)
VLDL: 9.8 mg/dL (ref 0.0–40.0)

## 2022-01-28 LAB — CBC WITH DIFFERENTIAL/PLATELET
Basophils Absolute: 0.1 10*3/uL (ref 0.0–0.1)
Basophils Relative: 1 % (ref 0.0–3.0)
Eosinophils Absolute: 0.3 10*3/uL (ref 0.0–0.7)
Eosinophils Relative: 3.4 % (ref 0.0–5.0)
HCT: 38.7 % — ABNORMAL LOW (ref 39.0–52.0)
Hemoglobin: 12.9 g/dL — ABNORMAL LOW (ref 13.0–17.0)
Lymphocytes Relative: 46.7 % — ABNORMAL HIGH (ref 12.0–46.0)
Lymphs Abs: 4.7 10*3/uL — ABNORMAL HIGH (ref 0.7–4.0)
MCHC: 33.5 g/dL (ref 30.0–36.0)
MCV: 100.6 fl — ABNORMAL HIGH (ref 78.0–100.0)
Monocytes Absolute: 0.6 10*3/uL (ref 0.1–1.0)
Monocytes Relative: 6.4 % (ref 3.0–12.0)
Neutro Abs: 4.3 10*3/uL (ref 1.4–7.7)
Neutrophils Relative %: 42.5 % — ABNORMAL LOW (ref 43.0–77.0)
Platelets: 179 10*3/uL (ref 150.0–400.0)
RBC: 3.85 Mil/uL — ABNORMAL LOW (ref 4.22–5.81)
RDW: 14.2 % (ref 11.5–15.5)
WBC: 10.2 10*3/uL (ref 4.0–10.5)

## 2022-01-28 LAB — HEPATIC FUNCTION PANEL
ALT: 10 U/L (ref 0–53)
AST: 21 U/L (ref 0–37)
Albumin: 4.5 g/dL (ref 3.5–5.2)
Alkaline Phosphatase: 87 U/L (ref 39–117)
Bilirubin, Direct: 0.1 mg/dL (ref 0.0–0.3)
Total Bilirubin: 0.5 mg/dL (ref 0.2–1.2)
Total Protein: 6.7 g/dL (ref 6.0–8.3)

## 2022-01-28 LAB — BASIC METABOLIC PANEL
BUN: 26 mg/dL — ABNORMAL HIGH (ref 6–23)
CO2: 26 mEq/L (ref 19–32)
Calcium: 9.5 mg/dL (ref 8.4–10.5)
Chloride: 102 mEq/L (ref 96–112)
Creatinine, Ser: 1.29 mg/dL (ref 0.40–1.50)
GFR: 49.17 mL/min — ABNORMAL LOW (ref >60.00)
Glucose, Bld: 110 mg/dL — ABNORMAL HIGH (ref 70–99)
Potassium: 4 mEq/L (ref 3.5–5.1)
Sodium: 138 mEq/L (ref 135–145)

## 2022-01-28 LAB — FERRITIN: Ferritin: 80.7 ng/mL (ref 22.0–322.0)

## 2022-01-28 LAB — HEMOGLOBIN A1C: Hgb A1c MFr Bld: 6 % (ref 4.6–6.5)

## 2022-02-01 ENCOUNTER — Ambulatory Visit (INDEPENDENT_AMBULATORY_CARE_PROVIDER_SITE_OTHER): Payer: Medicare HMO | Admitting: Internal Medicine

## 2022-02-01 ENCOUNTER — Encounter: Payer: Self-pay | Admitting: Internal Medicine

## 2022-02-01 DIAGNOSIS — E039 Hypothyroidism, unspecified: Secondary | ICD-10-CM

## 2022-02-01 DIAGNOSIS — I2510 Atherosclerotic heart disease of native coronary artery without angina pectoris: Secondary | ICD-10-CM

## 2022-02-01 DIAGNOSIS — N1831 Chronic kidney disease, stage 3a: Secondary | ICD-10-CM | POA: Diagnosis not present

## 2022-02-01 DIAGNOSIS — Z0000 Encounter for general adult medical examination without abnormal findings: Secondary | ICD-10-CM | POA: Diagnosis not present

## 2022-02-01 DIAGNOSIS — I10 Essential (primary) hypertension: Secondary | ICD-10-CM

## 2022-02-01 DIAGNOSIS — I5022 Chronic systolic (congestive) heart failure: Secondary | ICD-10-CM | POA: Diagnosis not present

## 2022-02-01 DIAGNOSIS — E785 Hyperlipidemia, unspecified: Secondary | ICD-10-CM

## 2022-02-01 DIAGNOSIS — E1159 Type 2 diabetes mellitus with other circulatory complications: Secondary | ICD-10-CM

## 2022-02-01 DIAGNOSIS — D649 Anemia, unspecified: Secondary | ICD-10-CM

## 2022-02-01 DIAGNOSIS — I350 Nonrheumatic aortic (valve) stenosis: Secondary | ICD-10-CM

## 2022-02-01 DIAGNOSIS — M25512 Pain in left shoulder: Secondary | ICD-10-CM

## 2022-02-01 DIAGNOSIS — I77810 Thoracic aortic ectasia: Secondary | ICD-10-CM

## 2022-02-01 NOTE — Progress Notes (Signed)
Patient ID: [name] [name], male   DOB: 16-May-1931, 88 y.o.   MRN: [id number]   Subjective:    Patient ID: [name] [name], male    DOB: 1931/02/21, 88 y.o.   MRN: [id number]   Patient here for his physical exam.  .   HPI Reports she is doing relatively well except for increased shoulder pain.  Discussed.  Taking gabapentin.  Limited rom.  Plans to call Dr Roland Rack for f/u.  Taking tylenol arthritis.  Denies chest pain or sob.  Feels breathing is stable. No increased cough or congestion.  No acid reflux reported.  No abdominal pain.  Bowels moving.  No increased lower extremity swelling.     Past Medical History:  Diagnosis Date   Aortic stenosis    Arthritis    right shoulder   BPH (benign prostatic hypertrophy)    Coronary artery disease    Gout    Heart murmur    History of chicken pox    Hypercholesterolemia    Hypertension    Systolic heart failure (HCC)    ischemic cardiomyopathy   Wears dentures    full upper and lower   Past Surgical History:  Procedure Laterality Date   AORTIC VALVE REPLACEMENT N/A 01/25/2019   Procedure: AORTIC VALVE REPLACEMENT (AVR) 52m Inspiris valve;  Surgeon: BGaye Pollack MD;  [facility];  Service: Open Heart Surgery;  Laterality: N/A;   arm surgery     right arm fx s/p "plate insertion"   CARDIAC CATHETERIZATION     CATARACT EXTRACTION W/PHACO Right 11/17/2016   Procedure: CATARACT EXTRACTION PHACO AND INTRAOCULAR LENS PLACEMENT ([place]  Right;  Surgeon: BLeandrew Koyanagi MD;  [place]  Service: Ophthalmology;  Laterality: Right;   CORONARY ARTERY BYPASS GRAFT N/A 01/25/2019   LIMA-LAD and sequential SVG-rPDA-rPL   DENTAL SURGERY     all teeth extracted   RIGHT HEART CATH AND CORONARY ANGIOGRAPHY N/A 01/02/2019   Procedure: RIGHT HEART CATH AND CORONARY ANGIOGRAPHY;  Surgeon: ENelva Bush MD;  [facility];  Service: Cardiovascular;  Laterality: N/A;   TEE WITHOUT CARDIOVERSION N/A 01/25/2019    Procedure: TRANSESOPHAGEAL ECHOCARDIOGRAM (TEE);  Surgeon: BGaye Pollack MD;  [place]  Service: Open Heart Surgery;  Laterality: N/A;   Family History  Problem Relation Age of Onset   Leukemia Father    Congestive Heart Failure Mother    Cancer Daughter        Breast Cancer   Prostate cancer Neg Hx    Colon cancer Neg Hx    Social History   Socioeconomic History   Marital status: Married    Spouse name: Not on file   Number of children: Not on file   Years of education: Not on file   Highest education level: Not on file  Occupational History   Not on file  Tobacco Use   Smoking status: Never   Smokeless tobacco: Current    Types: Chew   Tobacco comments:    not ready to quit currently  Vaping Use   Vaping Use: Never used  Substance and Sexual Activity   Alcohol use: No    Alcohol/week: 0.0 standard drinks of alcohol   Drug use: No   Sexual activity: Not Currently  Other Topics Concern   Not on file  Social History Narrative   Not on file   Social Determinants of Health   Financial Resource Strain: Low Risk  (10/21/2021)   Overall Financial  Resource Strain (CARDIA)    Difficulty of Paying Living Expenses: Not hard at all  Food Insecurity: No Food Insecurity (10/21/2021)   Hunger Vital Sign    Worried About Running Out of Food in the Last Year: Never true    Ran Out of Food in the Last Year: Never true  Transportation Needs: No Transportation Needs (10/21/2021)   PRAPARE - Hydrologist (Medical): No    Lack of Transportation (Non-Medical): No  Physical Activity: Unknown (09/15/2017)   Exercise Vital Sign    Days of Exercise per Week: 0 days    Minutes of Exercise per Session: Not on file  Stress: No Stress Concern Present (10/21/2021)   [place]    Feeling of Stress : Not at all  Social Connections: Unknown (10/21/2021)   Social Connection and Isolation Panel  [NHANES]    Frequency of Communication with Friends and Family: Not on file    Frequency of Social Gatherings with Friends and Family: Not on file    Attends Religious Services: Not on file    Active Member of Clubs or Organizations: Not on file    Attends Archivist Meetings: Not on file    Marital Status: Married     Review of Systems  Constitutional:  Negative for appetite change and unexpected weight change.  HENT:  Negative for congestion, sinus pressure and sore throat.   Eyes:  Negative for pain and visual disturbance.  Respiratory:  Negative for cough, chest tightness and shortness of breath.   Cardiovascular:  Negative for chest pain, palpitations and leg swelling.  Gastrointestinal:  Negative for abdominal pain, diarrhea, nausea and vomiting.  Genitourinary:  Negative for difficulty urinating and dysuria.  Musculoskeletal:  Negative for back pain and joint swelling.       Increased shoulder pain and limited rom as outlined.   Skin:  Negative for color change and rash.  Neurological:  Negative for dizziness, light-headedness and headaches.  Hematological:  Negative for adenopathy. Does not bruise/bleed easily.  Psychiatric/Behavioral:  Negative for agitation and dysphoric mood.        Objective:     BP (!) 148/70 (BP Location: Left Arm, Patient Position: Sitting, Cuff Size: Large)   Pulse 76   Temp 97.9 F (36.6 C) (Temporal)   Resp 13   Ht _0  (1.778 m)   Wt 217 lb 3.2 oz (98.5 kg)   SpO2 99%   BMI 31.16 kg/m  Wt Readings from Last 3 Encounters:  02/01/22 217 lb 3.2 oz (98.5 kg)  12/01/21 221 lb 9.6 oz (100.5 kg)  11/05/21 217 lb (98.4 kg)    Physical Exam Constitutional:      General: He is not in acute distress.    Appearance: Normal appearance. He is well-developed.  HENT:     Head: Normocephalic and atraumatic.     Right Ear: External ear normal.     Left Ear: External ear normal.  Eyes:     General: No scleral icterus.       Right  eye: No discharge.        Left eye: No discharge.     Conjunctiva/sclera: Conjunctivae normal.  Neck:     Thyroid: No thyromegaly.  Cardiovascular:     Rate and Rhythm: Normal rate and regular rhythm.  Pulmonary:     Effort: Pulmonary effort is normal. No respiratory distress.     Breath sounds: Normal breath  sounds. No wheezing.  Abdominal:     General: Bowel sounds are normal.     Palpations: Abdomen is soft.     Tenderness: There is no abdominal tenderness.  Musculoskeletal:        General: No swelling or tenderness.     Cervical back: Neck supple. No tenderness.  Lymphadenopathy:     Cervical: No cervical adenopathy.  Skin:    Findings: No erythema or rash.  Neurological:     Mental Status: He is alert and oriented to person, place, and time.  Psychiatric:        Mood and Affect: Mood normal.        Behavior: Behavior normal.      Outpatient Encounter Medications as of 02/01/2022  Medication Sig   allopurinol (ZYLOPRIM) 100 MG tablet TAKE 1 TABLET EVERY DAY   amLODipine (NORVASC) 5 MG tablet TAKE 1 TABLET EVERY DAY   aspirin EC 81 MG tablet Take 1 tablet (81 mg total) by mouth daily.   carvedilol (COREG) 6.25 MG tablet TAKE 1 TABLET TWICE DAILY WITH A MEAL   cyanocobalamin 1000 MCG tablet Take 1,000 mcg by mouth daily.   Fexofenadine HCl (ALLEGRA ALLERGY PO) Take 1 tablet by mouth daily.   hydrochlorothiazide (MICROZIDE) 12.5 MG capsule Take 1 capsule (12.5 mg total) by mouth daily.   levothyroxine (SYNTHROID) 50 MCG tablet Take 1 tablet (50 mcg total) by mouth daily.   LORazepam (ATIVAN) 1 MG tablet Takes 1/2 to 1 tablet q day prn   losartan (COZAAR) 100 MG tablet TAKE 1 TABLET EVERY DAY   Vitamin D3 (VITAMIN D) 25 MCG tablet Take 1,000 Units by mouth daily.   [DISCONTINUED] atorvastatin (LIPITOR) 80 MG tablet TAKE 1 TABLET EVERY MORNING   No facility-administered encounter medications on file as of 02/01/2022.     Lab Results  Component Value Date   WBC 10.2  01/28/2022   HGB 12.9 (L) 01/28/2022   HCT 38.7 (L) 01/28/2022   PLT 179.0 01/28/2022   GLUCOSE 110 (H) 01/28/2022   CHOL 105 01/28/2022   TRIG 49.0 01/28/2022   HDL 39.20 01/28/2022   LDLCALC 56 01/28/2022   ALT 10 01/28/2022   AST 21 01/28/2022   NA 138 01/28/2022   K 4.0 01/28/2022   CL 102 01/28/2022   CREATININE 1.29 01/28/2022   BUN 26 (H) 01/28/2022   CO2 26 01/28/2022   TSH 3.23 12/01/2021   PSA 4.15 (H) 02/08/2014   INR 1.7 (H) 01/25/2019   HGBA1C 6.0 01/28/2022   MICROALBUR 265.0 (H) 06/03/2021    MR SHOULDER LEFT WO CONTRAST  Result Date: 03/22/2021 CLINICAL DATA:  Limited range of motion of the left arm since having shingles. No injury or prior surgery. EXAM: MRI OF THE LEFT SHOULDER WITHOUT CONTRAST TECHNIQUE: Multiplanar, multisequence MR imaging of the shoulder was performed. No intravenous contrast was administered. COMPARISON:  None. FINDINGS: Rotator cuff: Mild supraspinatus tendinosis with low-grade partial-thickness bursal surface tear anteriorly at the insertion. The infraspinatus, teres minor, and subscapularis tendons are intact. Muscles: Mild diffuse muscle edema involving the supraspinatus, infraspinatus, teres minor, and deltoid muscles. Mild diffuse rotator cuff muscle fatty infiltration without frank atrophy. Biceps long head:  Intact and normally positioned. Acromioclavicular Joint: Mild arthropathy of the acromioclavicular joint. Type I acromion. Trace subacromial/subdeltoid bursal fluid. Glenohumeral Joint: Small joint effusion. Extensive full-thickness cartilage loss over the humeral head and glenoid with subchondral marrow edema and cystic change. Labrum:  Diffusely degenerated. Bones: No acute fracture or dislocation. No  suspicious bone lesion. Other: None. IMPRESSION: 1. Mild diffuse muscle edema involving the supraspinatus, infraspinatus, teres minor, and deltoid muscles. Findings are suggestive of acute denervation (brachial neuritis). 2. Mild  supraspinatus tendinosis with low-grade partial-thickness bursal surface tear anteriorly at the insertion. 3. 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 Electronically Signed   By: Titus Dubin M.D.   On: 03/22/2021 10:45       Assessment & Plan:   Problem List Items Addressed This Visit     Anemia    hgb 12.9 on last check.  Recheck cbc and ferritin next labs.       Relevant Orders   CBC with Differential/Platelet   Ferritin   Aortic valve stenosis    Continue f/u with cardiology.       Chronic HFrEF (heart failure with reduced ejection fraction) (HCC)    Continue losartan and coreg. Continues on hctz.  Swelling is better.  Discussed continuing to monitor salt intake.  Leg elevation.  Check metabolic panel.  Weigh daily.        CKD (chronic kidney disease) stage 3, GFR 30-59 ml/min (HCC)    Avoid antiinflammatories.  Stay hydrated.  Follow metabolic panel.  Continue losartan. On hctz.        Coronary artery disease involving native coronary artery of native heart without angina pectoris    Continue coreg, losartan and lipitor.  No chest pain.  Continue risk factor modification.       Diabetes ([place])    On no medication.  Stay hydrated.  Monitor sugars. Follow met b and a1c.  Lab Results  Component Value Date   HGBA1C 6.0 01/28/2022       Relevant Orders   Basic metabolic panel   Hemoglobin A1c   Dilatation of thoracic aorta (HCC)    S/p surgery.  Continues to be followed by cardiology - monitoring.       Health care maintenance    Physical today 02/01/22.  Colonoscopy 2012 - internal hemorrhoids.  No f/u recommended.  Declined prostate check and psa (previously)      Hyperlipidemia LDL goal <70    Continue lipitor.  Low cholesterol diet and exercise.  Follow lipid panel and liver function tests.       Relevant Orders   Hepatic function panel   Lipid panel   Hypertension    Continue losartan, amlodipine and Coreg.  Amlodipine 1m q day.   On hctz.  Blood pressure as outlined.   Follow blood pressure. Follow metabolic panel.  Recent GFR 49.  Follow.       Hypothyroid    On thyroid replacement.  Follow tsh.       Relevant Orders   TSH   Shoulder pain, left    Has seen ortho.  Has seen neurology.  Persistent pain and limited rom. Has been to PT.  Persistent increased pain and limited rom.  Plans to f/u with Dr PRoland Rack  Will let me know if any problems scheduling.         CEinar Pheasant MD

## 2022-02-08 ENCOUNTER — Encounter: Payer: Self-pay | Admitting: Internal Medicine

## 2022-02-08 ENCOUNTER — Other Ambulatory Visit: Payer: Self-pay | Admitting: Internal Medicine

## 2022-02-08 NOTE — Assessment & Plan Note (Signed)
On no medication.  Stay hydrated.  Monitor sugars. Follow met b and a1c.  Lab Results  Component Value Date   HGBA1C 6.0 01/28/2022

## 2022-02-08 NOTE — Assessment & Plan Note (Signed)
Continue lipitor.  Low cholesterol diet and exercise.  Follow lipid panel and liver function tests.   

## 2022-02-08 NOTE — Assessment & Plan Note (Signed)
S/p surgery.  Continues to be followed by cardiology - monitoring.  

## 2022-02-08 NOTE — Assessment & Plan Note (Signed)
On thyroid replacement.  Follow tsh.  

## 2022-02-08 NOTE — Assessment & Plan Note (Signed)
Continue coreg, losartan and lipitor.  No chest pain.  Continue risk factor modification.  

## 2022-02-08 NOTE — Assessment & Plan Note (Signed)
Physical today 02/01/22.  Colonoscopy 2012 - internal hemorrhoids.  No f/u recommended.  Declined prostate check and psa (previously)

## 2022-02-08 NOTE — Assessment & Plan Note (Signed)
Continue f/u with cardiology.  

## 2022-02-08 NOTE — Assessment & Plan Note (Signed)
Continue losartan and coreg. Continues on hctz.  Swelling is better.  Discussed continuing to monitor salt intake.  Leg elevation.  Check metabolic panel.  Weigh daily.

## 2022-02-08 NOTE — Assessment & Plan Note (Signed)
hgb 12.9 on last check.  Recheck cbc and ferritin next labs.

## 2022-02-08 NOTE — Assessment & Plan Note (Addendum)
Continue losartan, amlodipine and Coreg.  Amlodipine '5mg'$  q day.  On hctz.  Blood pressure as outlined.   Follow blood pressure. Follow metabolic panel.  Recent GFR 49.  Follow.

## 2022-02-08 NOTE — Assessment & Plan Note (Signed)
Avoid antiinflammatories.  Stay hydrated.  Follow metabolic panel.  Continue losartan. On hctz.   

## 2022-02-08 NOTE — Assessment & Plan Note (Signed)
Has seen ortho.  Has seen neurology.  Persistent pain and limited rom. Has been to PT.  Persistent increased pain and limited rom.  Plans to f/u with Dr Roland Rack.  Will let me know if any problems scheduling.

## 2022-02-17 ENCOUNTER — Other Ambulatory Visit: Payer: Self-pay | Admitting: Internal Medicine

## 2022-03-08 ENCOUNTER — Other Ambulatory Visit: Payer: Self-pay | Admitting: Internal Medicine

## 2022-03-08 ENCOUNTER — Other Ambulatory Visit: Payer: Self-pay

## 2022-03-08 MED ORDER — CARVEDILOL 6.25 MG PO TABS: ORAL_TABLET | ORAL | 1 refills | Status: DC

## 2022-03-08 NOTE — Telephone Encounter (Signed)
RX refilled by Benjamine Mola CMA on 03/08/22

## 2022-03-17 DIAGNOSIS — M7582 Other shoulder lesions, left shoulder: Secondary | ICD-10-CM | POA: Diagnosis not present

## 2022-03-17 DIAGNOSIS — M19012 Primary osteoarthritis, left shoulder: Secondary | ICD-10-CM | POA: Diagnosis not present

## 2022-04-07 ENCOUNTER — Telehealth: Payer: Self-pay

## 2022-04-07 NOTE — Patient Outreach (Signed)
  Care Coordination   Initial Visit Note   04/07/2022 Name: [name] [name] MRN: [id number] DOB: [birth year]  [name] [name] is a 88 y.o. year old male who sees Einar Pheasant, MD for primary care. I spoke with  [name] [name] by phone today.  What matters to the patients health and wellness today?  Patient states he does not have any nursing or community resource needs.     Goals Addressed             This Visit's Progress    COMPLETED: Care coordination activities       Care coordination program/ services discussed  Social determinants of health survey completed Patient advised to contact primary care provider office  if care coordination services needed in the future         SDOH assessments and interventions completed:  Yes  SDOH Interventions Today    Flowsheet Row Most Recent Value  SDOH Interventions   Food Insecurity Interventions Intervention Not Indicated  Housing Interventions Intervention Not Indicated  Transportation Interventions Intervention Not Indicated        Care Coordination Interventions Activated:  Yes  Care Coordination Interventions:  Yes, provided   Follow up plan: No further intervention required.   Encounter Outcome:  Pt. Visit Completed   Quinn Plowman RN,BSN,CCM [place] [phone] direct line

## 2022-05-04 ENCOUNTER — Other Ambulatory Visit (INDEPENDENT_AMBULATORY_CARE_PROVIDER_SITE_OTHER): Payer: Medicare HMO

## 2022-05-04 DIAGNOSIS — D649 Anemia, unspecified: Secondary | ICD-10-CM | POA: Diagnosis not present

## 2022-05-04 DIAGNOSIS — E1159 Type 2 diabetes mellitus with other circulatory complications: Secondary | ICD-10-CM | POA: Diagnosis not present

## 2022-05-04 DIAGNOSIS — E039 Hypothyroidism, unspecified: Secondary | ICD-10-CM

## 2022-05-04 DIAGNOSIS — E785 Hyperlipidemia, unspecified: Secondary | ICD-10-CM

## 2022-05-04 LAB — BASIC METABOLIC PANEL
BUN: 21 mg/dL (ref 6–23)
CO2: 25 mEq/L (ref 19–32)
Calcium: 9.2 mg/dL (ref 8.4–10.5)
Chloride: 104 mEq/L (ref 96–112)
Creatinine, Ser: 1.13 mg/dL (ref 0.40–1.50)
GFR: 57.53 mL/min — ABNORMAL LOW (ref >60.00)
Glucose, Bld: 112 mg/dL — ABNORMAL HIGH (ref 70–99)
Potassium: 3.8 mEq/L (ref 3.5–5.1)
Sodium: 137 mEq/L (ref 135–145)

## 2022-05-04 LAB — CBC WITH DIFFERENTIAL/PLATELET
Basophils Absolute: 0.1 10*3/uL (ref 0.0–0.1)
Basophils Relative: 1.1 % (ref 0.0–3.0)
Eosinophils Absolute: 0.3 10*3/uL (ref 0.0–0.7)
Eosinophils Relative: 2.6 % (ref 0.0–5.0)
HCT: 38.9 % — ABNORMAL LOW (ref 39.0–52.0)
Hemoglobin: 13 g/dL (ref 13.0–17.0)
Lymphocytes Relative: 57.2 % — ABNORMAL HIGH (ref 12.0–46.0)
Lymphs Abs: 5.7 10*3/uL — ABNORMAL HIGH (ref 0.7–4.0)
MCHC: 33.4 g/dL (ref 30.0–36.0)
MCV: 99 fl (ref 78.0–100.0)
Monocytes Absolute: 0.6 10*3/uL (ref 0.1–1.0)
Monocytes Relative: 6.5 % (ref 3.0–12.0)
Neutro Abs: 3.2 10*3/uL (ref 1.4–7.7)
Neutrophils Relative %: 32.6 % — ABNORMAL LOW (ref 43.0–77.0)
Platelets: 151 10*3/uL (ref 150.0–400.0)
RBC: 3.94 Mil/uL — ABNORMAL LOW (ref 4.22–5.81)
RDW: 13.8 % (ref 11.5–15.5)
WBC: 9.9 10*3/uL (ref 4.0–10.5)

## 2022-05-04 LAB — LIPID PANEL
Cholesterol: 107 mg/dL (ref 0–200)
HDL: 40.2 mg/dL (ref >39.00)
LDL Cholesterol: 58 mg/dL (ref 0–99)
NonHDL: 67.28
Total CHOL/HDL Ratio: 3
Triglycerides: 48 mg/dL (ref 0.0–149.0)
VLDL: 9.6 mg/dL (ref 0.0–40.0)

## 2022-05-04 LAB — HEMOGLOBIN A1C: Hgb A1c MFr Bld: 6.6 % — ABNORMAL HIGH (ref 4.6–6.5)

## 2022-05-04 LAB — FERRITIN: Ferritin: 87.2 ng/mL (ref 22.0–322.0)

## 2022-05-04 LAB — HEPATIC FUNCTION PANEL
ALT: 13 U/L (ref 0–53)
AST: 22 U/L (ref 0–37)
Albumin: 4.2 g/dL (ref 3.5–5.2)
Alkaline Phosphatase: 68 U/L (ref 39–117)
Bilirubin, Direct: 0.2 mg/dL (ref 0.0–0.3)
Total Bilirubin: 0.7 mg/dL (ref 0.2–1.2)
Total Protein: 6.4 g/dL (ref 6.0–8.3)

## 2022-05-04 LAB — TSH: TSH: 2.43 u[IU]/mL (ref 0.35–5.50)

## 2022-05-06 ENCOUNTER — Ambulatory Visit (INDEPENDENT_AMBULATORY_CARE_PROVIDER_SITE_OTHER): Payer: Medicare HMO | Admitting: Internal Medicine

## 2022-05-06 ENCOUNTER — Encounter: Payer: Self-pay | Admitting: Internal Medicine

## 2022-05-06 VITALS — BP 136/78 | HR 76 | Temp 98.6°F | Ht 70.0 in | Wt 222.3 lb

## 2022-05-06 DIAGNOSIS — E039 Hypothyroidism, unspecified: Secondary | ICD-10-CM

## 2022-05-06 DIAGNOSIS — M25512 Pain in left shoulder: Secondary | ICD-10-CM

## 2022-05-06 DIAGNOSIS — I2510 Atherosclerotic heart disease of native coronary artery without angina pectoris: Secondary | ICD-10-CM | POA: Diagnosis not present

## 2022-05-06 DIAGNOSIS — I5022 Chronic systolic (congestive) heart failure: Secondary | ICD-10-CM

## 2022-05-06 DIAGNOSIS — D649 Anemia, unspecified: Secondary | ICD-10-CM

## 2022-05-06 DIAGNOSIS — N1831 Chronic kidney disease, stage 3a: Secondary | ICD-10-CM

## 2022-05-06 DIAGNOSIS — Z23 Encounter for immunization: Secondary | ICD-10-CM

## 2022-05-06 DIAGNOSIS — E1159 Type 2 diabetes mellitus with other circulatory complications: Secondary | ICD-10-CM

## 2022-05-06 DIAGNOSIS — I350 Nonrheumatic aortic (valve) stenosis: Secondary | ICD-10-CM | POA: Diagnosis not present

## 2022-05-06 DIAGNOSIS — I77810 Thoracic aortic ectasia: Secondary | ICD-10-CM | POA: Diagnosis not present

## 2022-05-06 DIAGNOSIS — E785 Hyperlipidemia, unspecified: Secondary | ICD-10-CM | POA: Diagnosis not present

## 2022-05-06 DIAGNOSIS — I10 Essential (primary) hypertension: Secondary | ICD-10-CM | POA: Diagnosis not present

## 2022-05-06 DIAGNOSIS — Z952 Presence of prosthetic heart valve: Secondary | ICD-10-CM

## 2022-05-06 LAB — HM DIABETES FOOT EXAM

## 2022-05-06 MED ORDER — LOSARTAN POTASSIUM 100 MG PO TABS: 100.0000 mg | ORAL_TABLET | Freq: Every day | ORAL | 1 refills | Status: DC

## 2022-05-06 MED ORDER — AMLODIPINE BESYLATE 5 MG PO TABS: 5.0000 mg | ORAL_TABLET | Freq: Every day | ORAL | 1 refills | Status: DC

## 2022-05-06 MED ORDER — GABAPENTIN 100 MG PO CAPS: ORAL_CAPSULE | ORAL | 0 refills | Status: DC

## 2022-05-06 MED ORDER — ALLOPURINOL 100 MG PO TABS: 100.0000 mg | ORAL_TABLET | Freq: Every day | ORAL | 1 refills | Status: DC

## 2022-05-06 MED ORDER — LEVOTHYROXINE SODIUM 50 MCG PO TABS: 50.0000 ug | ORAL_TABLET | Freq: Every day | ORAL | 1 refills | Status: DC

## 2022-05-06 MED ORDER — CARVEDILOL 6.25 MG PO TABS: ORAL_TABLET | ORAL | 1 refills | Status: DC

## 2022-05-06 MED ORDER — GABAPENTIN 100 MG PO CAPS: ORAL_CAPSULE | ORAL | 1 refills | Status: DC

## 2022-05-06 NOTE — Progress Notes (Signed)
Patient ID: [name] [name], male   DOB: 11-18-1930, 88 y.o.   MRN: [id number]   Subjective:    Patient ID: [name] [name], male    DOB: 1931-05-04, 88 y.o.   MRN: [id number]   Patient here for  Chief Complaint  Patient presents with   Follow-up   Hypertension   .   HPI Here to follow up regarding his cholesterol, CAD and blood pressure.  Saw ortho 03/17/22 - f/u shoulder pain - AO left shoulder and rotator cuff tendinitis.  S/p injection. Lidoderm patches and gabapetnin.  313m dose gabapentin - too much.  Has 1079m- taking before bed.  92).  Denies chest pain.  Breathing stable.  No increased sob.  No abdominal pain or bowel change.     Past Medical History:  Diagnosis Date   Aortic stenosis    Arthritis    right shoulder   BPH (benign prostatic hypertrophy)    Coronary artery disease    Gout    Heart murmur    History of chicken pox    Hypercholesterolemia    Hypertension    Systolic heart failure (HCC)    ischemic cardiomyopathy   Wears dentures    full upper and lower   Past Surgical History:  Procedure Laterality Date   AORTIC VALVE REPLACEMENT N/A 01/25/2019   Procedure: AORTIC VALVE REPLACEMENT (AVR) 2531mnspiris valve;  Surgeon: BarGaye PollackD;  [facility];  Service: Open Heart Surgery;  Laterality: N/A;   arm surgery     right arm fx s/p "plate insertion"   CARDIAC CATHETERIZATION     CATARACT EXTRACTION W/PHACO Right 11/17/2016   Procedure: CATARACT EXTRACTION PHACO AND INTRAOCULAR LENS PLACEMENT ([place];  Surgeon: BraLeandrew KoyanagiD;  [facility];  Laterality: Right;   CORONARY ARTERY BYPASS GRAFT N/A 01/25/2019   LIMA-LAD and sequential SVG-rPDA-rPL   DENTAL SURGERY     all teeth extracted   RIGHT HEART CATH AND CORONARY ANGIOGRAPHY N/A 01/02/2019   Procedure: RIGHT HEART CATH AND CORONARY ANGIOGRAPHY;  Surgeon: EndNelva BushD;  [facility];  Service: Cardiovascular;   Laterality: N/A;   TEE WITHOUT CARDIOVERSION N/A 01/25/2019   Procedure: TRANSESOPHAGEAL ECHOCARDIOGRAM (TEE);  Surgeon: BarGaye PollackD;  [facility];  Laterality: N/A;   Family History  Problem Relation Age of Onset   Leukemia Father    Congestive Heart Failure Mother    Cancer Daughter        Breast Cancer   Prostate cancer Neg Hx    Colon cancer Neg Hx    Social History   Socioeconomic History   Marital status: Married    Spouse name: Not on file   Number of children: Not on file   Years of education: Not on file   Highest education level: Not on file  Occupational History   Not on file  Tobacco Use   Smoking status: Never   Smokeless tobacco: Current    Types: Chew   Tobacco comments:    not ready to quit currently  Vaping Use   Vaping Use: Never used  Substance and Sexual Activity   Alcohol use: No    Alcohol/week: 0.0 standard drinks of alcohol   Drug use: No   Sexual activity: Not Currently  Other Topics Concern   Not on file  Social History Narrative   Not on file   Social Determinants of  Health   Financial Resource Strain: Low Risk  (10/21/2021)   Overall Financial Resource Strain (CARDIA)    Difficulty of Paying Living Expenses: Not hard at all  Food Insecurity: No Food Insecurity (04/07/2022)   Hunger Vital Sign    Worried About Running Out of Food in the Last Year: Never true    Ran Out of Food in the Last Year: Never true  Transportation Needs: No Transportation Needs (04/07/2022)   PRAPARE - Hydrologist (Medical): No    Lack of Transportation (Non-Medical): No  Physical Activity: Unknown (09/15/2017)   Exercise Vital Sign    Days of Exercise per Week: 0 days    Minutes of Exercise per Session: Not on file  Stress: No Stress Concern Present (10/21/2021)   Divide    Feeling of Stress : Not at all  Social  Connections: Unknown (10/21/2021)   Social Connection and Isolation Panel [NHANES]    Frequency of Communication with Friends and Family: Not on file    Frequency of Social Gatherings with Friends and Family: Not on file    Attends Religious Services: Not on file    Active Member of Clubs or Organizations: Not on file    Attends Archivist Meetings: Not on file    Marital Status: Married     Review of Systems  Constitutional:  Negative for appetite change and unexpected weight change.  HENT:  Negative for congestion and sinus pressure.   Respiratory:  Negative for cough, chest tightness and shortness of breath.   Cardiovascular:  Negative for chest pain and palpitations.       No increased swelling.   Gastrointestinal:  Negative for abdominal pain, diarrhea, nausea and vomiting.  Genitourinary:  Negative for difficulty urinating and dysuria.  Musculoskeletal:  Negative for joint swelling and myalgias.       Left shoulder/arm issues as outlined.   Skin:  Negative for color change and rash.  Neurological:  Negative for dizziness and headaches.  Psychiatric/Behavioral:  Negative for agitation and dysphoric mood.        Objective:     BP 136/78 (BP Location: Left Arm, Patient Position: Sitting, Cuff Size: Large)   Pulse 76   Temp 98.6 F (37 C) (Temporal)   Ht 5' 10" (1.778 m)   Wt 222 lb 4.8 oz (100.8 kg)   SpO2 96%   BMI 31.90 kg/m  Wt Readings from Last 3 Encounters:  05/06/22 222 lb 4.8 oz (100.8 kg)  02/01/22 217 lb 3.2 oz (98.5 kg)  12/01/21 221 lb 9.6 oz (100.5 kg)    Physical Exam Vitals reviewed.  Constitutional:      General: He is not in acute distress.    Appearance: Normal appearance. He is well-developed.  HENT:     Head: Normocephalic and atraumatic.     Right Ear: External ear normal.     Left Ear: External ear normal.  Eyes:     General: No scleral icterus.       Right eye: No discharge.        Left eye: No discharge.      Conjunctiva/sclera: Conjunctivae normal.  Cardiovascular:     Rate and Rhythm: Normal rate and regular rhythm.  Pulmonary:     Effort: Pulmonary effort is normal. No respiratory distress.     Breath sounds: Normal breath sounds.  Abdominal:     General: Bowel sounds are normal.  Palpations: Abdomen is soft.     Tenderness: There is no abdominal tenderness.  Musculoskeletal:        General: No swelling or tenderness.     Cervical back: Neck supple. No tenderness.  Lymphadenopathy:     Cervical: No cervical adenopathy.  Skin:    Findings: No erythema or rash.  Neurological:     Mental Status: He is alert.  Psychiatric:        Mood and Affect: Mood normal.        Behavior: Behavior normal.      Outpatient Encounter Medications as of 05/06/2022  Medication Sig   aspirin EC 81 MG tablet Take 1 tablet (81 mg total) by mouth daily.   atorvastatin (LIPITOR) 80 MG tablet TAKE 1 TABLET EVERY MORNING   cyanocobalamin 1000 MCG tablet Take 1,000 mcg by mouth daily.   Fexofenadine HCl (ALLEGRA ALLERGY PO) Take 1 tablet by mouth daily.   hydrochlorothiazide (MICROZIDE) 12.5 MG capsule Take 1 capsule (12.5 mg total) by mouth daily.   LORazepam (ATIVAN) 1 MG tablet Takes 1/2 to 1 tablet q day prn   Vitamin D3 (VITAMIN D) 25 MCG tablet Take 1,000 Units by mouth daily.   [DISCONTINUED] allopurinol (ZYLOPRIM) 100 MG tablet TAKE 1 TABLET EVERY DAY   [DISCONTINUED] amLODipine (NORVASC) 5 MG tablet TAKE 1 TABLET EVERY DAY   [DISCONTINUED] carvedilol (COREG) 6.25 MG tablet TAKE 1 TABLET TWICE DAILY WITH A MEAL   [DISCONTINUED] gabapentin (NEURONTIN) 100 MG capsule Take two capsules q hs   [DISCONTINUED] levothyroxine (SYNTHROID) 50 MCG tablet Take 1 tablet (50 mcg total) by mouth daily.   [DISCONTINUED] losartan (COZAAR) 100 MG tablet TAKE 1 TABLET EVERY DAY   allopurinol (ZYLOPRIM) 100 MG tablet Take 1 tablet (100 mg total) by mouth daily.   amLODipine (NORVASC) 5 MG tablet Take 1 tablet (5 mg  total) by mouth daily.   carvedilol (COREG) 6.25 MG tablet TAKE 1 TABLET TWICE DAILY WITH A MEAL   gabapentin (NEURONTIN) 100 MG capsule Take two capsules q hs   levothyroxine (SYNTHROID) 50 MCG tablet Take 1 tablet (50 mcg total) by mouth daily.   losartan (COZAAR) 100 MG tablet Take 1 tablet (100 mg total) by mouth daily.   No facility-administered encounter medications on file as of 05/06/2022.     Lab Results  Component Value Date   WBC 9.9 05/04/2022   HGB 13.0 05/04/2022   HCT 38.9 (L) 05/04/2022   PLT 151.0 05/04/2022   GLUCOSE 112 (H) 05/04/2022   CHOL 107 05/04/2022   TRIG 48.0 05/04/2022   HDL 40.20 05/04/2022   LDLCALC 58 05/04/2022   ALT 13 05/04/2022   AST 22 05/04/2022   NA 137 05/04/2022   K 3.8 05/04/2022   CL 104 05/04/2022   CREATININE 1.13 05/04/2022   BUN 21 05/04/2022   CO2 25 05/04/2022   TSH 2.43 05/04/2022   PSA 4.15 (H) 02/08/2014   INR 1.7 (H) 01/25/2019   HGBA1C 6.6 (H) 05/04/2022   MICROALBUR 265.0 (H) 06/03/2021    MR SHOULDER LEFT WO CONTRAST  Result Date: 03/22/2021 CLINICAL DATA:  Limited range of motion of the left arm since having shingles. No injury or prior surgery. EXAM: MRI OF THE LEFT SHOULDER WITHOUT CONTRAST TECHNIQUE: Multiplanar, multisequence MR imaging of the shoulder was performed. No intravenous contrast was administered. COMPARISON:  None. FINDINGS: Rotator cuff: Mild supraspinatus tendinosis with low-grade partial-thickness bursal surface tear anteriorly at the insertion. The infraspinatus, teres minor, and subscapularis tendons  are intact. Muscles: Mild diffuse muscle edema involving the supraspinatus, infraspinatus, teres minor, and deltoid muscles. Mild diffuse rotator cuff muscle fatty infiltration without frank atrophy. Biceps long head:  Intact and normally positioned. Acromioclavicular Joint: Mild arthropathy of the acromioclavicular joint. Type I acromion. Trace subacromial/subdeltoid bursal fluid. Glenohumeral Joint: Small  joint effusion. Extensive full-thickness cartilage loss over the humeral head and glenoid with subchondral marrow edema and cystic change. Labrum:  Diffusely degenerated. Bones: No acute fracture or dislocation. No suspicious bone lesion. Other: None. IMPRESSION: 1. Mild diffuse muscle edema involving the supraspinatus, infraspinatus, teres minor, and deltoid muscles. Findings are suggestive of acute denervation (brachial neuritis). 2. Mild supraspinatus tendinosis with low-grade partial-thickness bursal surface tear anteriorly at the insertion. 3. 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 Electronically Signed   By: Titus Dubin M.D.   On: 03/22/2021 10:45       Assessment & Plan:   Problem List Items Addressed This Visit     Anemia    Check cbc and iron studies.       Relevant Orders   CBC with Differential/Platelet   Ferritin   Aortic valve stenosis    Continue f/u with cardiology.       Relevant Medications   amLODipine (NORVASC) 5 MG tablet   carvedilol (COREG) 6.25 MG tablet   losartan (COZAAR) 100 MG tablet   Chronic HFrEF (heart failure with reduced ejection fraction) (HCC)    Continue losartan and coreg. Continues on hctz.  Swelling is better.  Continue to monitor salt intake.  Leg elevation.  Check metabolic panel.  Weigh daily.        Relevant Medications   amLODipine (NORVASC) 5 MG tablet   carvedilol (COREG) 6.25 MG tablet   losartan (COZAAR) 100 MG tablet   CKD (chronic kidney disease) stage 3, GFR 30-59 ml/min (HCC)    Avoid antiinflammatories.  Stay hydrated.  Follow metabolic panel.  Continue losartan. On hctz.        Relevant Orders   Basic metabolic panel   Coronary artery disease involving native coronary artery of native heart without angina pectoris    Continue coreg, losartan and lipitor.  No chest pain.  Continue risk factor modification.       Relevant Medications   amLODipine (NORVASC) 5 MG tablet   carvedilol (COREG) 6.25  MG tablet   losartan (COZAAR) 100 MG tablet   Diabetes ([place])    On no medication.  Stay hydrated.  Monitor sugars. Follow met b and a1c.  Lab Results  Component Value Date   HGBA1C 6.6 (H) 05/04/2022       Relevant Medications   losartan (COZAAR) 100 MG tablet   Other Relevant Orders   Hemoglobin A1c   Microalbumin / creatinine urine ratio   Dilatation of thoracic aorta (HCC)    S/p surgery.  Continues to be followed by cardiology - monitoring.       Relevant Medications   amLODipine (NORVASC) 5 MG tablet   carvedilol (COREG) 6.25 MG tablet   losartan (COZAAR) 100 MG tablet   Other Relevant Orders   Hepatic function panel   Lipid panel   Hyperlipidemia LDL goal <70    Continue lipitor.  Low cholesterol diet and exercise.  Follow lipid panel and liver function tests.       Relevant Medications   amLODipine (NORVASC) 5 MG tablet   carvedilol (COREG) 6.25 MG tablet   losartan (COZAAR) 100 MG tablet   Hypertension    Continue losartan,  amlodipine and Coreg.  Amlodipine 31m q day.  On hctz.  Blood pressure as outlined.   Follow blood pressure. Follow metabolic panel.       Relevant Medications   amLODipine (NORVASC) 5 MG tablet   carvedilol (COREG) 6.25 MG tablet   losartan (COZAAR) 100 MG tablet   Hypothyroid    On thyroid replacement.  Follow tsh.       Relevant Medications   carvedilol (COREG) 6.25 MG tablet   levothyroxine (SYNTHROID) 50 MCG tablet   S/P aortic valve replacement    Last echo - normal functioning bioprosthetic valve.  Continue losartan and coreg.  Continue f/u with cardiology.       Shoulder pain, left    Has seen ortho.  Has seen neurology.  Persistent pain.  S/p injection and PT.  Request refill gabapentin 1025m- 2 qhs. Follow.        Other Visit Diagnoses     Need for influenza vaccination    -  Primary   Relevant Orders   Flu Vaccine QUAD High Dose(Fluad) (Completed)        ChEinar PheasantMD

## 2022-05-12 DIAGNOSIS — B0223 Postherpetic polyneuropathy: Secondary | ICD-10-CM | POA: Diagnosis not present

## 2022-05-12 DIAGNOSIS — M7582 Other shoulder lesions, left shoulder: Secondary | ICD-10-CM | POA: Diagnosis not present

## 2022-05-12 DIAGNOSIS — M19012 Primary osteoarthritis, left shoulder: Secondary | ICD-10-CM | POA: Diagnosis not present

## 2022-05-16 ENCOUNTER — Encounter: Payer: Self-pay | Admitting: Internal Medicine

## 2022-05-16 NOTE — Assessment & Plan Note (Signed)
Continue losartan, amlodipine and Coreg.  Amlodipine '5mg'$  q day.  On hctz.  Blood pressure as outlined.   Follow blood pressure. Follow metabolic panel.

## 2022-05-16 NOTE — Assessment & Plan Note (Signed)
Avoid antiinflammatories.  Stay hydrated.  Follow metabolic panel.  Continue losartan. On hctz.

## 2022-05-16 NOTE — Assessment & Plan Note (Signed)
Check cbc and iron studies.   

## 2022-05-16 NOTE — Assessment & Plan Note (Signed)
Has seen ortho.  Has seen neurology.  Persistent pain.  S/p injection and PT.  Request refill gabapentin '100mg'$  - 2 qhs. Follow.

## 2022-05-16 NOTE — Assessment & Plan Note (Signed)
Continue coreg, losartan and lipitor.  No chest pain.  Continue risk factor modification.

## 2022-05-16 NOTE — Addendum Note (Signed)
Addended by: Alisa Graff on: 05/16/2022 08:21 PM   Modules accepted: Level of Service

## 2022-05-16 NOTE — Assessment & Plan Note (Signed)
Continue lipitor.  Low cholesterol diet and exercise.  Follow lipid panel and liver function tests.   

## 2022-05-16 NOTE — Assessment & Plan Note (Signed)
Continue f/u with cardiology.

## 2022-05-16 NOTE — Assessment & Plan Note (Signed)
S/p surgery.  Continues to be followed by cardiology - monitoring.

## 2022-05-16 NOTE — Assessment & Plan Note (Signed)
Continue losartan and coreg. Continues on hctz.  Swelling is better.  Continue to monitor salt intake.  Leg elevation.  Check metabolic panel.  Weigh daily.

## 2022-05-16 NOTE — Assessment & Plan Note (Signed)
Last echo - normal functioning bioprosthetic valve.  Continue losartan and coreg.  Continue f/u with cardiology.

## 2022-05-16 NOTE — Assessment & Plan Note (Signed)
On thyroid replacement.  Follow tsh.  

## 2022-05-16 NOTE — Assessment & Plan Note (Signed)
On no medication.  Stay hydrated.  Monitor sugars. Follow met b and a1c.  Lab Results  Component Value Date   HGBA1C 6.6 (H) 05/04/2022

## 2022-05-19 ENCOUNTER — Ambulatory Visit: Payer: Medicare HMO | Admitting: Internal Medicine

## 2022-05-19 NOTE — Progress Notes (Deleted)
Follow-up Outpatient Visit Date: 05/19/2022  Primary Care Provider: Einar Pheasant, [place] [address] [address]  Chief Complaint: ***  HPI:  [name] is a 88 y.o. male with history of coronary artery disease and systolic heart failure secondary to ischemic cardiomyopathy complicated by severe aortic stenosis status post CABG (LIMA to LAD and sequential SVG to RPDA and RPL) and bioprosthetic aortic valve replacement on 01/25/2019, hypertension, hyperlipidemia, diabetes mellitus, asthma, arthritis, gout, and BPH , who presents for follow-up of Neri artery disease, heart failure, and valvular heart disease.  I last saw him in May, at which time he reported fatigue over the last few weeks.  He wondered if it could be related to triamterene-HCTZ or fexofenadine.  We agreed to stop triamterene-HCTZ and add HCTZ 12.5 mg daily alone.  I also suggested holding fexofenadine.  --------------------------------------------------------------------------------------------------  Past Medical History:  Diagnosis Date   Aortic stenosis    Arthritis    right shoulder   BPH (benign prostatic hypertrophy)    Coronary artery disease    Gout    Heart murmur    History of chicken pox    Hypercholesterolemia    Hypertension    Systolic heart failure (HCC)    ischemic cardiomyopathy   Wears dentures    full upper and lower   Past Surgical History:  Procedure Laterality Date   AORTIC VALVE REPLACEMENT N/A 01/25/2019   Procedure: AORTIC VALVE REPLACEMENT (AVR) 25m Inspiris valve;  Surgeon: BGaye Pollack MD;  [facility];  Service: Open Heart Surgery;  Laterality: N/A;   arm surgery     right arm fx s/p "plate insertion"   CARDIAC CATHETERIZATION     CATARACT EXTRACTION W/PHACO Right 11/17/2016   Procedure: CATARACT EXTRACTION PHACO AND INTRAOCULAR LENS PLACEMENT ([place]  Right;  Surgeon: BLeandrew Koyanagi MD;  [facility]  Service:  Ophthalmology;  Laterality: Right;   CORONARY ARTERY BYPASS GRAFT N/A 01/25/2019   LIMA-LAD and sequential SVG-rPDA-rPL   DENTAL SURGERY     all teeth extracted   RIGHT HEART CATH AND CORONARY ANGIOGRAPHY N/A 01/02/2019   Procedure: RIGHT HEART CATH AND CORONARY ANGIOGRAPHY;  Surgeon: ENelva Bush MD;  [facility];  Service: Cardiovascular;  Laterality: N/A;   TEE WITHOUT CARDIOVERSION N/A 01/25/2019   Procedure: TRANSESOPHAGEAL ECHOCARDIOGRAM (TEE);  Surgeon: BGaye Pollack MD;  [facility]  Service: Open Heart Surgery;  Laterality: N/A;    No outpatient medications have been marked as taking for the 05/19/22 encounter (Appointment) with Oliver Neuwirth, CHarrell Gave MD.    Allergies: Patient has no known allergies.  Social History   Tobacco Use   Smoking status: Never   Smokeless tobacco: Current    Types: Chew   Tobacco comments:    not ready to quit currently  Vaping Use   Vaping Use: Never used  Substance Use Topics   Alcohol use: No    Alcohol/week: 0.0 standard drinks of alcohol   Drug use: No    Family History  Problem Relation Age of Onset   Leukemia Father    Congestive Heart Failure Mother    Cancer Daughter        Breast Cancer   Prostate cancer Neg Hx    Colon cancer Neg Hx     Review of Systems: A 12-system review of systems was performed and was negative except as noted in the HPI.  --------------------------------------------------------------------------------------------------  Physical Exam: There were no vitals taken for this  visit.  General:  NAD. Neck: No JVD or HJR. Lungs: Clear to auscultation bilaterally without wheezes or crackles. Heart: Regular rate and rhythm without murmurs, rubs, or gallops. Abdomen: Soft, nontender, nondistended. Extremities: No lower extremity edema.  EKG:  ***  Lab Results  Component Value Date   WBC 9.9 05/04/2022   HGB 13.0 05/04/2022   HCT 38.9 (L) 05/04/2022   MCV 99.0 05/04/2022    PLT 151.0 05/04/2022    Lab Results  Component Value Date   NA 137 05/04/2022   K 3.8 05/04/2022   CL 104 05/04/2022   CO2 25 05/04/2022   BUN 21 05/04/2022   CREATININE 1.13 05/04/2022   GLUCOSE 112 (H) 05/04/2022   ALT 13 05/04/2022    Lab Results  Component Value Date   CHOL 107 05/04/2022   HDL 40.20 05/04/2022   LDLCALC 58 05/04/2022   TRIG 48.0 05/04/2022   CHOLHDL 3 05/04/2022    --------------------------------------------------------------------------------------------------  ASSESSMENT AND PLAN: Harrell Gave Tylek Boney, MD 05/19/2022 7:53 AM

## 2022-05-21 ENCOUNTER — Ambulatory Visit: Payer: Medicare HMO | Attending: Internal Medicine | Admitting: Internal Medicine

## 2022-05-21 ENCOUNTER — Encounter: Payer: Self-pay | Admitting: Internal Medicine

## 2022-05-21 VITALS — BP 144/74 | HR 61 | Ht 70.0 in | Wt 232.0 lb

## 2022-05-21 DIAGNOSIS — Z952 Presence of prosthetic heart valve: Secondary | ICD-10-CM

## 2022-05-21 DIAGNOSIS — E785 Hyperlipidemia, unspecified: Secondary | ICD-10-CM

## 2022-05-21 DIAGNOSIS — I350 Nonrheumatic aortic (valve) stenosis: Secondary | ICD-10-CM | POA: Diagnosis not present

## 2022-05-21 DIAGNOSIS — I2510 Atherosclerotic heart disease of native coronary artery without angina pectoris: Secondary | ICD-10-CM

## 2022-05-21 DIAGNOSIS — I10 Essential (primary) hypertension: Secondary | ICD-10-CM

## 2022-05-21 NOTE — Progress Notes (Unsigned)
Follow-up Outpatient Visit Date: 05/21/2022  Primary Care Provider: Einar Pheasant, [place] [address] [phone]  Chief Complaint: Follow-up aortic stenosis, coronary artery disease and heart failure with recovered ejection fraction  HPI:  [name] [name] is a 88 y.o. male with history of coronary artery disease and systolic heart failure secondary to ischemic cardiomyopathy complicated by severe aortic stenosis status post CABG (LIMA to LAD and sequential SVG to RPDA and RPL) and bioprosthetic aortic valve replacement on 01/25/2019, hypertension, hyperlipidemia, prediabetes, asthma, arthritis, gout, and BPH , who presents for follow-up of coronary artery disease, heart failure, and valvular heart disease.  I last saw him in May, at which time he reported fatigue over the last few weeks.  He wondered if it could be related to triamterene-HCTZ or fexofenadine.  We agreed to stop triamterene-HCTZ and add HCTZ 12.5 mg daily alone.  I also suggested holding fexofenadine.  Today, [name] [name] reports that he is feeling well.  His energy level is better, though he notes that his sleep is often interrupted by alarms from his wife's continuous glucose monitor.  He has not had any chest pain or shortness of breath, remaining quite active around the house.  He denies edema as well as palpitations and lightheadedness.  He is not monitoring his blood pressures at home.  He is tolerating his medications well and has not experienced any urinary frequency on HCTZ.   --------------------------------------------------------------------------------------------------  Past Medical History:  Diagnosis Date   Aortic stenosis    Arthritis    right shoulder   BPH (benign prostatic hypertrophy)    Coronary artery disease    Gout    Heart murmur    History of chicken pox    Hypercholesterolemia    Hypertension    Systolic heart failure (HCC)    ischemic cardiomyopathy   Wears  dentures    full upper and lower   Past Surgical History:  Procedure Laterality Date   AORTIC VALVE REPLACEMENT N/A 01/25/2019   Procedure: AORTIC VALVE REPLACEMENT (AVR) 97m Inspiris valve;  Surgeon: BGaye Pollack MD;  [facility];  Service: Open Heart Surgery;  Laterality: N/A;   arm surgery     right arm fx s/p "plate insertion"   CARDIAC CATHETERIZATION     CATARACT EXTRACTION W/PHACO Right 11/17/2016   Procedure: CATARACT EXTRACTION PHACO AND INTRAOCULAR LENS PLACEMENT ([place]  Right;  Surgeon: BLeandrew Koyanagi MD;  [facility]  Service: Ophthalmology;  Laterality: Right;   CORONARY ARTERY BYPASS GRAFT N/A 01/25/2019   LIMA-LAD and sequential SVG-rPDA-rPL   DENTAL SURGERY     all teeth extracted   RIGHT HEART CATH AND CORONARY ANGIOGRAPHY N/A 01/02/2019   Procedure: RIGHT HEART CATH AND CORONARY ANGIOGRAPHY;  Surgeon: ENelva Bush MD;  [facility];  Service: Cardiovascular;  Laterality: N/A;   TEE WITHOUT CARDIOVERSION N/A 01/25/2019   Procedure: TRANSESOPHAGEAL ECHOCARDIOGRAM (TEE);  Surgeon: BGaye Pollack MD;  [place] [facility]  Service: Open Heart Surgery;  Laterality: N/A;    Current Meds  Medication Sig   allopurinol (ZYLOPRIM) 100 MG tablet Take 1 tablet (100 mg total) by mouth daily.   amLODipine (NORVASC) 5 MG tablet Take 1 tablet (5 mg total) by mouth daily.   aspirin EC 81 MG tablet Take 1 tablet (81 mg total) by mouth daily.   atorvastatin (LIPITOR) 80 MG tablet TAKE 1 TABLET EVERY MORNING   carvedilol (COREG) 6.25 MG tablet TAKE 1 TABLET TWICE DAILY  WITH A MEAL   cyanocobalamin 1000 MCG tablet Take 1,000 mcg by mouth daily.   Fexofenadine HCl (ALLEGRA ALLERGY PO) Take 1 tablet by mouth daily as needed.   gabapentin (NEURONTIN) 100 MG capsule Take two capsules q hs   hydrochlorothiazide (MICROZIDE) 12.5 MG capsule Take 1 capsule (12.5 mg total) by mouth daily.   levothyroxine (SYNTHROID) 50 MCG tablet Take 1 tablet (50  mcg total) by mouth daily.   LORazepam (ATIVAN) 1 MG tablet Takes 1/2 to 1 tablet q day prn   losartan (COZAAR) 100 MG tablet Take 1 tablet (100 mg total) by mouth daily.    Allergies: Patient has no known allergies.  Social History   Tobacco Use   Smoking status: Never   Smokeless tobacco: Current    Types: Chew   Tobacco comments:    not ready to quit currently  Vaping Use   Vaping Use: Never used  Substance Use Topics   Alcohol use: No    Alcohol/week: 0.0 standard drinks of alcohol   Drug use: No    Family History  Problem Relation Age of Onset   Leukemia Father    Congestive Heart Failure Mother    Cancer Daughter        Breast Cancer   Prostate cancer Neg Hx    Colon cancer Neg Hx     Review of Systems: A 12-system review of systems was performed and was negative except as noted in the HPI.  --------------------------------------------------------------------------------------------------  Physical Exam: BP (!) 144/74 (BP Location: Left Arm, Patient Position: Sitting, Cuff Size: Large)   Pulse 61   Ht '5\' 10"'$  (1.778 m)   Wt 232 lb (105.2 kg)   SpO2 98%   BMI 33.29 kg/m  Repeat BP: 136/80  General:  NAD. Neck: No JVD or HJR. Lungs: Clear to auscultation bilaterally without wheezes or crackles. Heart: Regular rate and rhythm with 1/6 systolic murmur.  No rubs or gallops. Abdomen: Soft, nontender, nondistended. Extremities: Trace pretibial edema bilaterally.  EKG: Normal sinus rhythm with left axis deviation, borderline LVH, nonspecific T wave changes.  Compared with prior tracing from 08/06/2021, PVC is no longer present.  PR interval has also shortened.  Lab Results  Component Value Date   WBC 9.9 05/04/2022   HGB 13.0 05/04/2022   HCT 38.9 (L) 05/04/2022   MCV 99.0 05/04/2022   PLT 151.0 05/04/2022    Lab Results  Component Value Date   NA 137 05/04/2022   K 3.8 05/04/2022   CL 104 05/04/2022   CO2 25 05/04/2022   BUN 21 05/04/2022    CREATININE 1.13 05/04/2022   GLUCOSE 112 (H) 05/04/2022   ALT 13 05/04/2022    Lab Results  Component Value Date   CHOL 107 05/04/2022   HDL 40.20 05/04/2022   LDLCALC 58 05/04/2022   TRIG 48.0 05/04/2022   CHOLHDL 3 05/04/2022    --------------------------------------------------------------------------------------------------  ASSESSMENT AND PLAN: Coronary artery disease: No angina reported.  Continue current medications for secondary prevention.  Aortic stenosis status post bioprosthetic AVR: No signs or symptoms of heart failure noted other than trace pretibial edema.  Continue aspirin therapy and SBE prophylaxis.  Chronic heart failure with recovered ejection fraction: [name] [name] does not have any symptoms of heart failure consistent with NYHA class I.  LVEF was 60-65% on last echo in 07/2020.  Continue current regimen of carvedilol and losartan.  Hypertension: Blood pressure suboptimally controlled today.  We discussed escalation of his medications, though [name] [name] wishes  to defer this in favor of lifestyle modifications and sodium restriction.  Hyperlipidemia: Lipids well-controlled on last check last month.  Continue atorvastatin 80 mg daily.  Follow-up: Return to clinic in 6 months.  Nelva Bush, MD 05/21/2022 10:37 AM

## 2022-05-21 NOTE — Patient Instructions (Signed)
Medication Instructions:  Your Physician recommend you continue on your current medication as directed.    *If you need a refill on your cardiac medications before your next appointment, please call your pharmacy*   Lab Work: None ordered today   Testing/Procedures: None ordered today   Follow-Up: At [facility], you and your health needs are our priority.  As part of our continuing mission to provide you with exceptional heart care, we have created designated Provider Care Teams.  These Care Teams include your primary Cardiologist (physician) and Advanced Practice Providers (APPs -  Physician Assistants and Nurse Practitioners) who all work together to provide you with the care you need, when you need it.  We recommend signing up for the patient portal called "MyChart".  Sign up information is provided on this After Visit Summary.  MyChart is used to connect with patients for Virtual Visits (Telemedicine).  Patients are able to view lab/test results, encounter notes, upcoming appointments, etc.  Non-urgent messages can be sent to your provider as well.   To learn more about what you can do with MyChart, go to [email or web address].    Your next appointment:   6 month(s)  The format for your next appointment:   In Person  Provider:   You may see Christopher End, MD or one of the following Advanced Practice Providers on your designated Care Team:   Christopher Berge, NP [name] Dunn, PA-C Cadence Furth, PA-C Sheri Hammock, NP          

## 2022-05-22 ENCOUNTER — Encounter: Payer: Self-pay | Admitting: Internal Medicine

## 2022-06-21 ENCOUNTER — Other Ambulatory Visit: Payer: Self-pay | Admitting: Internal Medicine

## 2022-06-21 HISTORY — PX: OTHER SURGICAL HISTORY: SHX169

## 2022-07-26 DIAGNOSIS — D485 Neoplasm of uncertain behavior of skin: Secondary | ICD-10-CM | POA: Diagnosis not present

## 2022-07-26 DIAGNOSIS — L570 Actinic keratosis: Secondary | ICD-10-CM | POA: Diagnosis not present

## 2022-07-26 DIAGNOSIS — C44629 Squamous cell carcinoma of skin of left upper limb, including shoulder: Secondary | ICD-10-CM | POA: Diagnosis not present

## 2022-07-26 DIAGNOSIS — D2271 Melanocytic nevi of right lower limb, including hip: Secondary | ICD-10-CM | POA: Diagnosis not present

## 2022-07-26 DIAGNOSIS — D2262 Melanocytic nevi of left upper limb, including shoulder: Secondary | ICD-10-CM | POA: Diagnosis not present

## 2022-07-26 DIAGNOSIS — D2261 Melanocytic nevi of right upper limb, including shoulder: Secondary | ICD-10-CM | POA: Diagnosis not present

## 2022-07-26 DIAGNOSIS — Z85828 Personal history of other malignant neoplasm of skin: Secondary | ICD-10-CM | POA: Diagnosis not present

## 2022-08-26 DIAGNOSIS — C44629 Squamous cell carcinoma of skin of left upper limb, including shoulder: Secondary | ICD-10-CM | POA: Diagnosis not present

## 2022-08-30 DIAGNOSIS — Z0100 Encounter for examination of eyes and vision without abnormal findings: Secondary | ICD-10-CM | POA: Diagnosis not present

## 2022-08-30 DIAGNOSIS — H2512 Age-related nuclear cataract, left eye: Secondary | ICD-10-CM | POA: Diagnosis not present

## 2022-08-30 DIAGNOSIS — Z961 Presence of intraocular lens: Secondary | ICD-10-CM | POA: Diagnosis not present

## 2022-08-30 DIAGNOSIS — H353131 Nonexudative age-related macular degeneration, bilateral, early dry stage: Secondary | ICD-10-CM | POA: Diagnosis not present

## 2022-09-06 ENCOUNTER — Other Ambulatory Visit (INDEPENDENT_AMBULATORY_CARE_PROVIDER_SITE_OTHER): Payer: Medicare HMO

## 2022-09-06 DIAGNOSIS — D649 Anemia, unspecified: Secondary | ICD-10-CM | POA: Diagnosis not present

## 2022-09-06 DIAGNOSIS — N1831 Chronic kidney disease, stage 3a: Secondary | ICD-10-CM

## 2022-09-06 DIAGNOSIS — I77810 Thoracic aortic ectasia: Secondary | ICD-10-CM

## 2022-09-06 DIAGNOSIS — E1159 Type 2 diabetes mellitus with other circulatory complications: Secondary | ICD-10-CM

## 2022-09-06 LAB — CBC WITH DIFFERENTIAL/PLATELET
Basophils Absolute: 0.1 10*3/uL (ref 0.0–0.1)
Basophils Relative: 1 % (ref 0.0–3.0)
Eosinophils Absolute: 0.3 10*3/uL (ref 0.0–0.7)
Eosinophils Relative: 3.2 % (ref 0.0–5.0)
HCT: 39.2 % (ref 39.0–52.0)
Hemoglobin: 13.3 g/dL (ref 13.0–17.0)
Lymphocytes Relative: 52.1 % — ABNORMAL HIGH (ref 12.0–46.0)
Lymphs Abs: 5.2 10*3/uL — ABNORMAL HIGH (ref 0.7–4.0)
MCHC: 33.9 g/dL (ref 30.0–36.0)
MCV: 98.5 fl (ref 78.0–100.0)
Monocytes Absolute: 0.7 10*3/uL (ref 0.1–1.0)
Monocytes Relative: 6.6 % (ref 3.0–12.0)
Neutro Abs: 3.7 10*3/uL (ref 1.4–7.7)
Neutrophils Relative %: 37.1 % — ABNORMAL LOW (ref 43.0–77.0)
Platelets: 167 10*3/uL (ref 150.0–400.0)
RBC: 3.98 Mil/uL — ABNORMAL LOW (ref 4.22–5.81)
RDW: 13.3 % (ref 11.5–15.5)
WBC: 10 10*3/uL (ref 4.0–10.5)

## 2022-09-06 LAB — BASIC METABOLIC PANEL
BUN: 25 mg/dL — ABNORMAL HIGH (ref 6–23)
CO2: 24 mEq/L (ref 19–32)
Calcium: 9.5 mg/dL (ref 8.4–10.5)
Chloride: 105 mEq/L (ref 96–112)
Creatinine, Ser: 1.15 mg/dL (ref 0.40–1.50)
GFR: 56.2 mL/min — ABNORMAL LOW (ref >60.00)
Glucose, Bld: 107 mg/dL — ABNORMAL HIGH (ref 70–99)
Potassium: 3.8 mEq/L (ref 3.5–5.1)
Sodium: 138 mEq/L (ref 135–145)

## 2022-09-06 LAB — HEPATIC FUNCTION PANEL
ALT: 11 U/L (ref 0–53)
AST: 18 U/L (ref 0–37)
Albumin: 4.2 g/dL (ref 3.5–5.2)
Alkaline Phosphatase: 66 U/L (ref 39–117)
Bilirubin, Direct: 0.1 mg/dL (ref 0.0–0.3)
Total Bilirubin: 0.6 mg/dL (ref 0.2–1.2)
Total Protein: 6.4 g/dL (ref 6.0–8.3)

## 2022-09-06 LAB — LIPID PANEL
Cholesterol: 103 mg/dL (ref 0–200)
HDL: 38.8 mg/dL — ABNORMAL LOW (ref >39.00)
LDL Cholesterol: 54 mg/dL (ref 0–99)
NonHDL: 63.91
Total CHOL/HDL Ratio: 3
Triglycerides: 51 mg/dL (ref 0.0–149.0)
VLDL: 10.2 mg/dL (ref 0.0–40.0)

## 2022-09-06 LAB — MICROALBUMIN / CREATININE URINE RATIO
Creatinine,U: 66.5 mg/dL
Microalb Creat Ratio: 47.9 mg/g — ABNORMAL HIGH (ref 0.0–30.0)
Microalb, Ur: 31.9 mg/dL — ABNORMAL HIGH (ref 0.0–1.9)

## 2022-09-06 LAB — HEMOGLOBIN A1C: Hgb A1c MFr Bld: 6.4 % (ref 4.6–6.5)

## 2022-09-06 LAB — FERRITIN: Ferritin: 77.7 ng/mL (ref 22.0–322.0)

## 2022-09-08 ENCOUNTER — Ambulatory Visit (INDEPENDENT_AMBULATORY_CARE_PROVIDER_SITE_OTHER): Payer: Medicare HMO | Admitting: Internal Medicine

## 2022-09-08 ENCOUNTER — Encounter: Payer: Self-pay | Admitting: Internal Medicine

## 2022-09-08 VITALS — BP 132/78 | HR 73 | Temp 98.0°F | Resp 16 | Ht 70.0 in | Wt 234.0 lb

## 2022-09-08 DIAGNOSIS — I350 Nonrheumatic aortic (valve) stenosis: Secondary | ICD-10-CM

## 2022-09-08 DIAGNOSIS — I10 Essential (primary) hypertension: Secondary | ICD-10-CM | POA: Diagnosis not present

## 2022-09-08 DIAGNOSIS — I2510 Atherosclerotic heart disease of native coronary artery without angina pectoris: Secondary | ICD-10-CM

## 2022-09-08 DIAGNOSIS — M1A9XX0 Chronic gout, unspecified, without tophus (tophi): Secondary | ICD-10-CM

## 2022-09-08 DIAGNOSIS — E1159 Type 2 diabetes mellitus with other circulatory complications: Secondary | ICD-10-CM | POA: Diagnosis not present

## 2022-09-08 DIAGNOSIS — I77810 Thoracic aortic ectasia: Secondary | ICD-10-CM

## 2022-09-08 DIAGNOSIS — Z952 Presence of prosthetic heart valve: Secondary | ICD-10-CM

## 2022-09-08 DIAGNOSIS — D649 Anemia, unspecified: Secondary | ICD-10-CM

## 2022-09-08 DIAGNOSIS — E039 Hypothyroidism, unspecified: Secondary | ICD-10-CM

## 2022-09-08 DIAGNOSIS — E785 Hyperlipidemia, unspecified: Secondary | ICD-10-CM

## 2022-09-08 DIAGNOSIS — I5022 Chronic systolic (congestive) heart failure: Secondary | ICD-10-CM | POA: Diagnosis not present

## 2022-09-08 DIAGNOSIS — N1831 Chronic kidney disease, stage 3a: Secondary | ICD-10-CM

## 2022-09-08 MED ORDER — LORAZEPAM 1 MG PO TABS: ORAL_TABLET | ORAL | 1 refills | Status: DC

## 2022-09-08 NOTE — Progress Notes (Signed)
Subjective:    Patient ID: [name] [name], male    DOB: 03/11/1931, 89 y.o.   MRN: [id number]  Patient here for  Chief Complaint  Patient presents with   Medical Management of Chronic Issues    HPI Here to follow up regarding his cholesterol, CAD and blood pressure.  Recent squamous cell cancer - removed.  Due to get sutures removed tomorrow.  No chest pain or sob reported.  Saw Dr End 05/21/22 - stable.  No changes made.  No abdominal pain or bowel change reported.  On gabapentin.  Tolerating.  Discussed increased stress.  Overall feels he is handling things relatively well.     Past Medical History:  Diagnosis Date   Aortic stenosis    Arthritis    right shoulder   BPH (benign prostatic hypertrophy)    Coronary artery disease    Gout    Heart murmur    History of chicken pox    Hypercholesterolemia    Hypertension    Systolic heart failure (HCC)    ischemic cardiomyopathy   Wears dentures    full upper and lower   Past Surgical History:  Procedure Laterality Date   AORTIC VALVE REPLACEMENT N/A 01/25/2019   Procedure: AORTIC VALVE REPLACEMENT (AVR) 53mm Inspiris valve;  Surgeon: Gaye Pollack, MD;  [facility];  Service: Open Heart Surgery;  Laterality: N/A;   arm surgery     right arm fx s/p "plate insertion"   CARDIAC CATHETERIZATION     CATARACT EXTRACTION W/PHACO Right 11/17/2016   Procedure: CATARACT EXTRACTION PHACO AND INTRAOCULAR LENS PLACEMENT ([place])  Right;  Surgeon: Leandrew Koyanagi, MD;  Location: [place];  Service: Ophthalmology;  Laterality: Right;   CORONARY ARTERY BYPASS GRAFT N/A 01/25/2019   LIMA-LAD and sequential SVG-rPDA-rPL   DENTAL SURGERY     all teeth extracted   RIGHT HEART CATH AND CORONARY ANGIOGRAPHY N/A 01/02/2019   Procedure: RIGHT HEART CATH AND CORONARY ANGIOGRAPHY;  Surgeon: Nelva Bush, MD;  Location: [place] [facility];  Service: Cardiovascular;  Laterality: N/A;   TEE WITHOUT CARDIOVERSION N/A 01/25/2019    Procedure: TRANSESOPHAGEAL ECHOCARDIOGRAM (TEE);  Surgeon: Gaye Pollack, MD;  Location: [place];  Service: Open Heart Surgery;  Laterality: N/A;   Family History  Problem Relation Age of Onset   Leukemia Father    Congestive Heart Failure Mother    Cancer Daughter        Breast Cancer   Prostate cancer Neg Hx    Colon cancer Neg Hx    Social History   Socioeconomic History   Marital status: Married    Spouse name: Not on file   Number of children: Not on file   Years of education: Not on file   Highest education level: Not on file  Occupational History   Not on file  Tobacco Use   Smoking status: Never   Smokeless tobacco: Current    Types: Chew   Tobacco comments:    not ready to quit currently  Vaping Use   Vaping Use: Never used  Substance and Sexual Activity   Alcohol use: No    Alcohol/week: 0.0 standard drinks of alcohol   Drug use: No   Sexual activity: Not Currently  Other Topics Concern   Not on file  Social History Narrative   Not on file   Social Determinants of Health   Financial Resource Strain: Low Risk  (10/21/2021)   Overall Financial Resource Strain (CARDIA)  Difficulty of Paying Living Expenses: Not hard at all  Food Insecurity: No Food Insecurity (04/07/2022)   Hunger Vital Sign    Worried About Running Out of Food in the Last Year: Never true    Ran Out of Food in the Last Year: Never true  Transportation Needs: No Transportation Needs (04/07/2022)   PRAPARE - Hydrologist (Medical): No    Lack of Transportation (Non-Medical): No  Physical Activity: Unknown (09/15/2017)   Exercise Vital Sign    Days of Exercise per Week: 0 days    Minutes of Exercise per Session: Not on file  Stress: No Stress Concern Present (10/21/2021)   [place]    Feeling of Stress : Not at all  Social Connections: Unknown (10/21/2021)   Social Connection and Isolation Panel  [NHANES]    Frequency of Communication with Friends and Family: Not on file    Frequency of Social Gatherings with Friends and Family: Not on file    Attends Religious Services: Not on file    Active Member of Clubs or Organizations: Not on file    Attends Archivist Meetings: Not on file    Marital Status: Married     Review of Systems  Constitutional:  Negative for appetite change and unexpected weight change.  HENT:  Negative for congestion and sinus pressure.   Respiratory:  Negative for cough, chest tightness and shortness of breath.   Cardiovascular:  Negative for chest pain and palpitations.  Gastrointestinal:  Negative for abdominal pain, diarrhea, nausea and vomiting.  Genitourinary:  Negative for difficulty urinating and dysuria.  Musculoskeletal:  Negative for joint swelling and myalgias.  Skin:  Negative for color change and rash.  Neurological:  Negative for dizziness and headaches.  Psychiatric/Behavioral:  Negative for agitation and dysphoric mood.        Objective:     BP 132/78   Pulse 73   Temp 98 F (36.7 C)   Resp 16   Ht 5\' 10"  (1.778 m)   Wt 234 lb (106.1 kg)   SpO2 98%   BMI 33.58 kg/m  Wt Readings from Last 3 Encounters:  09/08/22 234 lb (106.1 kg)  05/21/22 232 lb (105.2 kg)  05/06/22 222 lb 4.8 oz (100.8 kg)    Physical Exam Vitals reviewed.  Constitutional:      General: He is not in acute distress.    Appearance: Normal appearance. He is well-developed.  HENT:     Head: Normocephalic and atraumatic.     Right Ear: External ear normal.     Left Ear: External ear normal.  Eyes:     General: No scleral icterus.       Right eye: No discharge.        Left eye: No discharge.     Conjunctiva/sclera: Conjunctivae normal.  Cardiovascular:     Rate and Rhythm: Normal rate and regular rhythm.  Pulmonary:     Effort: Pulmonary effort is normal. No respiratory distress.     Breath sounds: Normal breath sounds.  Abdominal:      General: Bowel sounds are normal.     Palpations: Abdomen is soft.     Tenderness: There is no abdominal tenderness.  Musculoskeletal:        General: No swelling or tenderness.     Cervical back: Neck supple. No tenderness.  Lymphadenopathy:     Cervical: No cervical adenopathy.  Skin:  Findings: No erythema or rash.  Neurological:     Mental Status: He is alert.  Psychiatric:        Mood and Affect: Mood normal.        Behavior: Behavior normal.      Outpatient Encounter Medications as of 09/08/2022  Medication Sig   allopurinol (ZYLOPRIM) 100 MG tablet Take 1 tablet (100 mg total) by mouth daily.   amLODipine (NORVASC) 5 MG tablet Take 1 tablet (5 mg total) by mouth daily.   aspirin EC 81 MG tablet Take 1 tablet (81 mg total) by mouth daily.   atorvastatin (LIPITOR) 80 MG tablet TAKE 1 TABLET EVERY MORNING   carvedilol (COREG) 6.25 MG tablet TAKE 1 TABLET TWICE DAILY WITH A MEAL   cyanocobalamin 1000 MCG tablet Take 1,000 mcg by mouth daily.   Fexofenadine HCl (ALLEGRA ALLERGY PO) Take 1 tablet by mouth daily as needed.   gabapentin (NEURONTIN) 100 MG capsule Take two capsules q hs   hydrochlorothiazide (MICROZIDE) 12.5 MG capsule TAKE 1 CAPSULE EVERY DAY   levothyroxine (SYNTHROID) 50 MCG tablet Take 1 tablet (50 mcg total) by mouth daily.   losartan (COZAAR) 100 MG tablet Take 1 tablet (100 mg total) by mouth daily.   [DISCONTINUED] LORazepam (ATIVAN) 1 MG tablet Takes 1/2 to 1 tablet q day prn   LORazepam (ATIVAN) 1 MG tablet Takes 1/2 to 1 tablet q day prn   No facility-administered encounter medications on file as of 09/08/2022.     Lab Results  Component Value Date   WBC 10.0 09/06/2022   HGB 13.3 09/06/2022   HCT 39.2 09/06/2022   PLT 167.0 09/06/2022   GLUCOSE 107 (H) 09/06/2022   CHOL 103 09/06/2022   TRIG 51.0 09/06/2022   HDL 38.80 (L) 09/06/2022   LDLCALC 54 09/06/2022   ALT 11 09/06/2022   AST 18 09/06/2022   NA 138 09/06/2022   K 3.8 09/06/2022    CL 105 09/06/2022   CREATININE 1.15 09/06/2022   BUN 25 (H) 09/06/2022   CO2 24 09/06/2022   TSH 2.43 05/04/2022   PSA 4.15 (H) 02/08/2014   INR 1.7 (H) 01/25/2019   HGBA1C 6.4 09/06/2022   MICROALBUR 31.9 (H) 09/06/2022    MR SHOULDER LEFT WO CONTRAST  Result Date: 03/22/2021 CLINICAL DATA:  Limited range of motion of the left arm since having shingles. No injury or prior surgery. EXAM: MRI OF THE LEFT SHOULDER WITHOUT CONTRAST TECHNIQUE: Multiplanar, multisequence MR imaging of the shoulder was performed. No intravenous contrast was administered. COMPARISON:  None. FINDINGS: Rotator cuff: Mild supraspinatus tendinosis with low-grade partial-thickness bursal surface tear anteriorly at the insertion. The infraspinatus, teres minor, and subscapularis tendons are intact. Muscles: Mild diffuse muscle edema involving the supraspinatus, infraspinatus, teres minor, and deltoid muscles. Mild diffuse rotator cuff muscle fatty infiltration without frank atrophy. Biceps long head:  Intact and normally positioned. Acromioclavicular Joint: Mild arthropathy of the acromioclavicular joint. Type I acromion. Trace subacromial/subdeltoid bursal fluid. Glenohumeral Joint: Small joint effusion. Extensive full-thickness cartilage loss over the humeral head and glenoid with subchondral marrow edema and cystic change. Labrum:  Diffusely degenerated. Bones: No acute fracture or dislocation. No suspicious bone lesion. Other: None. IMPRESSION: 1. Mild diffuse muscle edema involving the supraspinatus, infraspinatus, teres minor, and deltoid muscles. Findings are suggestive of acute denervation (brachial neuritis). 2. Mild supraspinatus tendinosis with low-grade partial-thickness bursal surface tear anteriorly at the insertion. 3. 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 Electronically Signed   By: Titus Dubin  M.D.   On: 03/22/2021 10:45       Assessment & Plan:  Primary hypertension Assessment &  Plan: Continue losartan, amlodipine and Coreg.  Amlodipine 5mg  q day.  On hctz.  Blood pressure as outlined.   Follow blood pressure. Follow metabolic panel.   Orders: -     Basic metabolic panel; Future  Anemia, unspecified type Assessment & Plan: Follow cbc and iron studies.   Orders: -     CBC with Differential/Platelet; Future -     Ferritin; Future  Aortic valve stenosis, etiology of cardiac valve disease unspecified Assessment & Plan: Continue f/u with cardiology.  Breathing stable.  No dizziness or light headedness.     Chronic HFrEF (heart failure with reduced ejection fraction) (HCC) Assessment & Plan: Continue losartan and coreg. Continues on hctz.  Swelling is better.  Continue to monitor salt intake.  Leg elevation.  Check metabolic panel.  Weigh daily.  Breathing stable.  No increased sob.    Stage 3a chronic kidney disease (HCC) Assessment & Plan: Avoid antiinflammatories.  Stay hydrated.  Follow metabolic panel.  Continue losartan. On hctz.     Coronary artery disease involving native coronary artery of native heart without angina pectoris Assessment & Plan: Continue coreg, losartan and lipitor.  No chest pain.  Continue risk factor modification.    Type 2 diabetes mellitus with other circulatory complication, without long-term current use of insulin ([place]) Assessment & Plan: On no medication.  Stay hydrated.  Monitor sugars. Follow met b and a1c.  Lab Results  Component Value Date   HGBA1C 6.4 09/06/2022    Orders: -     Hemoglobin A1c; Future  Dilatation of thoracic aorta [facility]) Assessment & Plan: S/p surgery.  Continues to be followed by cardiology - monitoring.    Chronic gout without tophus, unspecified cause, unspecified site Assessment & Plan: Recent gout flare.  No pain now.  Follow.  Remains on allopurinol.    Hyperlipidemia LDL goal <70 Assessment & Plan: Continue lipitor.  Low cholesterol diet and exercise.  Follow lipid panel and liver  function tests.   Orders: -     Lipid panel; Future -     Hepatic function panel; Future  Hypothyroidism, unspecified type Assessment & Plan: On thyroid replacement.  Follow tsh.    S/P aortic valve replacement Assessment & Plan: Last echo - normal functioning bioprosthetic valve.  Continue losartan and coreg.  Continue f/u with cardiology.    Other orders -     LORazepam; Takes 1/2 to 1 tablet q day prn  Dispense: 30 tablet; Refill: 1     Einar Pheasant, MD

## 2022-09-19 ENCOUNTER — Encounter: Payer: Self-pay | Admitting: Internal Medicine

## 2022-09-19 NOTE — Assessment & Plan Note (Signed)
S/p surgery.  Continues to be followed by cardiology - monitoring.  

## 2022-09-19 NOTE — Assessment & Plan Note (Signed)
Continue lipitor.  Low cholesterol diet and exercise.  Follow lipid panel and liver function tests.   

## 2022-09-19 NOTE — Assessment & Plan Note (Signed)
On thyroid replacement.  Follow tsh.  

## 2022-09-19 NOTE — Assessment & Plan Note (Signed)
Continue losartan and coreg. Continues on hctz.  Swelling is better.  Continue to monitor salt intake.  Leg elevation.  Check metabolic panel.  Weigh daily.  Breathing stable.  No increased sob.

## 2022-09-19 NOTE — Assessment & Plan Note (Signed)
Follow cbc and iron studies.  

## 2022-09-19 NOTE — Assessment & Plan Note (Signed)
Continue f/u with cardiology.  Breathing stable.  No dizziness or light headedness.

## 2022-09-19 NOTE — Assessment & Plan Note (Signed)
On no medication.  Stay hydrated.  Monitor sugars. Follow met b and a1c.  Lab Results  Component Value Date   HGBA1C 6.4 09/06/2022

## 2022-09-19 NOTE — Assessment & Plan Note (Signed)
Continue losartan, amlodipine and Coreg.  Amlodipine 5mg q day.  On hctz.  Blood pressure as outlined.   Follow blood pressure. Follow metabolic panel.  

## 2022-09-19 NOTE — Assessment & Plan Note (Signed)
Avoid antiinflammatories.  Stay hydrated.  Follow metabolic panel.  Continue losartan. On hctz.   

## 2022-09-19 NOTE — Assessment & Plan Note (Signed)
Continue coreg, losartan and lipitor.  No chest pain.  Continue risk factor modification.  

## 2022-09-19 NOTE — Assessment & Plan Note (Signed)
Last echo - normal functioning bioprosthetic valve.  Continue losartan and coreg.  Continue f/u with cardiology.  

## 2022-09-19 NOTE — Assessment & Plan Note (Signed)
Recent gout flare.  No pain now.  Follow.  Remains on allopurinol.  

## 2022-10-13 ENCOUNTER — Other Ambulatory Visit: Payer: Self-pay | Admitting: Internal Medicine

## 2022-10-22 ENCOUNTER — Telehealth: Payer: Self-pay | Admitting: Internal Medicine

## 2022-10-22 NOTE — Telephone Encounter (Signed)
Called patient to schedule Medicare Annual Wellness Visit (AWV). Left message for patient to call back and schedule Medicare Annual Wellness Visit (AWV).  Last date of AWV: 10/21/2021   Please schedule an AWVS appointment at any time with [facility] VISIT.  If any questions, please contact me at [phone].    Thank you,  [facility] Support [facility] Direct dial  [phone]

## 2022-10-26 ENCOUNTER — Telehealth: Payer: Self-pay | Admitting: Internal Medicine

## 2022-10-26 NOTE — Telephone Encounter (Signed)
Copied from CRM [id number]. Topic: Medicare AWV >> Oct 26, 2022  1:10 PM Payton Doughty wrote: Reason for CRM: Called patient to schedule Medicare Annual Wellness Visit (AWV). Left message for patient to call back and schedule Medicare Annual Wellness Visit (AWV).  Last date of AWV: 10/21/21  Please schedule an appointment at any time with Annabell Sabal, CMA  .  If any questions, please contact me.  Thank you ,  Verlee Rossetti; Care Guide Ambulatory Clinical Support [place] [facility] Direct Dial: [phone]

## 2022-11-16 ENCOUNTER — Other Ambulatory Visit: Payer: Self-pay | Admitting: Internal Medicine

## 2022-11-23 ENCOUNTER — Other Ambulatory Visit: Payer: Self-pay | Admitting: Internal Medicine

## 2022-11-27 ENCOUNTER — Other Ambulatory Visit: Payer: Self-pay | Admitting: Internal Medicine

## 2022-12-08 ENCOUNTER — Ambulatory Visit: Payer: Medicare HMO | Attending: Internal Medicine | Admitting: Internal Medicine

## 2022-12-08 ENCOUNTER — Encounter: Payer: Self-pay | Admitting: Internal Medicine

## 2022-12-08 VITALS — BP 130/78 | HR 71 | Ht 70.0 in | Wt 232.0 lb

## 2022-12-08 DIAGNOSIS — I350 Nonrheumatic aortic (valve) stenosis: Secondary | ICD-10-CM | POA: Diagnosis not present

## 2022-12-08 DIAGNOSIS — Z952 Presence of prosthetic heart valve: Secondary | ICD-10-CM | POA: Diagnosis not present

## 2022-12-08 DIAGNOSIS — E785 Hyperlipidemia, unspecified: Secondary | ICD-10-CM | POA: Diagnosis not present

## 2022-12-08 DIAGNOSIS — I10 Essential (primary) hypertension: Secondary | ICD-10-CM | POA: Diagnosis not present

## 2022-12-08 DIAGNOSIS — I2510 Atherosclerotic heart disease of native coronary artery without angina pectoris: Secondary | ICD-10-CM

## 2022-12-08 DIAGNOSIS — I5022 Chronic systolic (congestive) heart failure: Secondary | ICD-10-CM | POA: Diagnosis not present

## 2022-12-08 NOTE — Progress Notes (Signed)
Follow-up Outpatient Visit Date: 12/08/2022  Primary Care Provider: Dale Crestwood, MD [address] [place] [address]  Chief Complaint: Follow-up CAD, HF, and valvular heart disease  HPI:  [name] is a 89 y.o. male with history of coronary artery disease and systolic heart failure secondary to ischemic cardiomyopathy complicated by severe aortic stenosis status post CABG (LIMA to LAD and sequential SVG to RPDA and RPL) and bioprosthetic aortic valve replacement on 01/25/2019, hypertension, hyperlipidemia, prediabetes, asthma, arthritis, gout, and BPH, who presents for follow-up of coronary artery disease, valvular heart disease, and heart failure.  I last saw [name] in December, at which time he was feeling fairly well.  We did not make any medication changes or pursue additional testing.  Today, [name] reports that he is feeling fairly well.  He remains quite active around the house and yesterday was able to do 7 hours of work in his yard including cutting limbs with a chainsaw and hauling around brush.  He has not had any chest pain, shortness of breath, palpitations, lightheadedness, or edema.  He notes some fatigue and wonders if his medications could be contributing.  Overall, he feels similar to prior visits.  He does not check his blood pressure regularly at home.  He is tolerating his medications well without side effects.  --------------------------------------------------------------------------------------------------  Past Medical History:  Diagnosis Date   Aortic stenosis    Arthritis    right shoulder   BPH (benign prostatic hypertrophy)    Coronary artery disease    Gout    Heart murmur    History of chicken pox    Hypercholesterolemia    Hypertension    Systolic heart failure (HCC)    ischemic cardiomyopathy   Wears dentures    full upper and lower   Past Surgical History:  Procedure Laterality Date   AORTIC VALVE  REPLACEMENT N/A 01/25/2019   Procedure: AORTIC VALVE REPLACEMENT (AVR) 25mm Inspiris valve;  Surgeon: Alleen Borne, MD;  Location: [facility] OR;  Service: Open Heart Surgery;  Laterality: N/A;   arm surgery     right arm fx s/p "plate insertion"   CARDIAC CATHETERIZATION     CATARACT EXTRACTION W/PHACO Right 11/17/2016   Procedure: CATARACT EXTRACTION PHACO AND INTRAOCULAR LENS PLACEMENT (IOC)  Right;  Surgeon: Lockie Mola, MD;  Location: [facility];  Service: Ophthalmology;  Laterality: Right;   CORONARY ARTERY BYPASS GRAFT N/A 01/25/2019   LIMA-LAD and sequential SVG-rPDA-rPL   DENTAL SURGERY     all teeth extracted   RIGHT HEART CATH AND CORONARY ANGIOGRAPHY N/A 01/02/2019   Procedure: RIGHT HEART CATH AND CORONARY ANGIOGRAPHY;  Surgeon: Yvonne Kendall, MD;  Location: [facility] INVASIVE CV LAB;  Service: Cardiovascular;  Laterality: N/A;   TEE WITHOUT CARDIOVERSION N/A 01/25/2019   Procedure: TRANSESOPHAGEAL ECHOCARDIOGRAM (TEE);  Surgeon: Alleen Borne, MD;  Location: [facility] OR;  Service: Open Heart Surgery;  Laterality: N/A;    Current Meds  Medication Sig   allopurinol (ZYLOPRIM) 100 MG tablet TAKE 1 TABLET EVERY DAY   amLODipine (NORVASC) 5 MG tablet TAKE 1 TABLET EVERY DAY   aspirin EC 81 MG tablet Take 1 tablet (81 mg total) by mouth daily.   atorvastatin (LIPITOR) 80 MG tablet TAKE 1 TABLET EVERY MORNING   carvedilol (COREG) 6.25 MG tablet TAKE 1 TABLET TWICE DAILY WITH MEALS   cyanocobalamin 1000 MCG tablet Take 1,000 mcg by mouth daily.   Fexofenadine HCl (ALLEGRA ALLERGY PO) Take 1 tablet by  mouth daily as needed.   gabapentin (NEURONTIN) 100 MG capsule TAKE 2 CAPSULES AT BEDTIME   hydrochlorothiazide (MICROZIDE) 12.5 MG capsule TAKE 1 CAPSULE EVERY DAY   levothyroxine (SYNTHROID) 50 MCG tablet TAKE 1 TABLET EVERY DAY   LORazepam (ATIVAN) 1 MG tablet Takes 1/2 to 1 tablet q day prn   losartan (COZAAR) 100 MG tablet TAKE 1 TABLET EVERY DAY    Allergies: Patient has no  known allergies.  Social History   Tobacco Use   Smoking status: Never   Smokeless tobacco: Current    Types: Chew   Tobacco comments:    not ready to quit currently  Vaping Use   Vaping Use: Never used  Substance Use Topics   Alcohol use: No    Alcohol/week: 0.0 standard drinks of alcohol   Drug use: No    Family History  Problem Relation Age of Onset   Leukemia Father    Congestive Heart Failure Mother    Cancer Daughter        Breast Cancer   Prostate cancer Neg Hx    Colon cancer Neg Hx     Review of Systems: A 12-system review of systems was performed and was negative except as noted in the HPI.  --------------------------------------------------------------------------------------------------  Physical Exam: BP 130/78 (BP Location: Left Arm, Patient Position: Sitting, Cuff Size: Normal)   Pulse 71   Ht 5\' 10"  (1.778 m)   Wt 232 lb (105.2 kg)   SpO2 97%   BMI 33.29 kg/m   General:  NAD. Neck: No JVD or HJR. Lungs: Clear to auscultation bilaterally without wheezes or crackles. Heart: Regular rate and rhythm with 1/6 systolic murmur.  No rubs or gallops. Abdomen: Soft, nontender, nondistended. Extremities: Trace pretibial edema bilaterally.  EKG: Normal sinus rhythm with left axis deviation, borderline LVH, and nonspecific T wave abnormality.  No significant change from prior tracing on 05/21/2022.  Lab Results  Component Value Date   WBC 10.0 09/06/2022   HGB 13.3 09/06/2022   HCT 39.2 09/06/2022   MCV 98.5 09/06/2022   PLT 167.0 09/06/2022    Lab Results  Component Value Date   NA 138 09/06/2022   K 3.8 09/06/2022   CL 105 09/06/2022   CO2 24 09/06/2022   BUN 25 (H) 09/06/2022   CREATININE 1.15 09/06/2022   GLUCOSE 107 (H) 09/06/2022   ALT 11 09/06/2022    Lab Results  Component Value Date   CHOL 103 09/06/2022   HDL 38.80 (L) 09/06/2022   LDLCALC 54 09/06/2022   TRIG 51.0 09/06/2022   CHOLHDL 3 09/06/2022     --------------------------------------------------------------------------------------------------  ASSESSMENT AND PLAN: Coronary artery disease: No angina status post CABG.  Continue current medications for secondary prevention.  Aortic stenosis status post bioprosthetic AVR: No signs or symptoms of heart failure or valve dysfunction.  Continue indefinite aspirin.  Patient has dentures and therefore does not have routine dental cleanings that would necessitate SBE prophylaxis.  Plan to repeat echocardiogram when prosthesis is 87 years old, sooner if symptoms develop in the meantime.  Heart failure with recovered ejection fraction: Other than chronic trace pretibial edema, [name] appears euvolemic with stable NYHA class I-II symptoms.  We will plan to continue his current regimen of carvedilol and losartan.  Hypertension: Blood pressure upper normal today.  We discussed titrating his medications to see if some of his fatigue would improve, specifically decreasing/stopping carvedilol.  However, this would likely necessitate escalation of other antihypertensive therapy.  Given that he  overall feels well with reasonable blood pressure today, we have agreed to defer any medication changes.  Hyperlipidemia: LDL and triglycerides well-controlled on last check in March.  Continue atorvastatin 80 mg daily for secondary prevention.  Follow-up: Return to clinic in 6 months.  Yvonne Kendall, MD 12/08/2022 11:08 AM

## 2022-12-08 NOTE — Patient Instructions (Addendum)
Medication Instructions:  Your physician recommends that you continue on your current medications as directed. Please refer to the Current Medication list given to you today.   *If you need a refill on your cardiac medications before your next appointment, please call your pharmacy*   Lab Work: None ordered today   Testing/Procedures: None ordered today   Follow-Up: At [facility], you and your health needs are our priority.  As part of our continuing mission to provide you with exceptional heart care, we have created designated Provider Care Teams.  These Care Teams include your primary Cardiologist (physician) and Advanced Practice Providers (APPs -  Physician Assistants and Nurse Practitioners) who all work together to provide you with the care you need, when you need it.  We recommend signing up for the patient portal called "MyChart".  Sign up information is provided on this After Visit Summary.  MyChart is used to connect with patients for Virtual Visits (Telemedicine).  Patients are able to view lab/test results, encounter notes, upcoming appointments, etc.  Non-urgent messages can be sent to your provider as well.   To learn more about what you can do with MyChart, go to [email or web address].    Your next appointment:   6 month(s)  Provider:   You may see Christopher End, MD or one of the following Advanced Practice Providers on your designated Care Team:   Christopher Berge, NP [name] Dunn, PA-C Cadence Furth, PA-C Sheri Hammock, NP    

## 2023-01-06 ENCOUNTER — Other Ambulatory Visit (INDEPENDENT_AMBULATORY_CARE_PROVIDER_SITE_OTHER): Payer: Medicare HMO

## 2023-01-06 DIAGNOSIS — I10 Essential (primary) hypertension: Secondary | ICD-10-CM | POA: Diagnosis not present

## 2023-01-06 DIAGNOSIS — E1159 Type 2 diabetes mellitus with other circulatory complications: Secondary | ICD-10-CM | POA: Diagnosis not present

## 2023-01-06 DIAGNOSIS — E785 Hyperlipidemia, unspecified: Secondary | ICD-10-CM | POA: Diagnosis not present

## 2023-01-06 DIAGNOSIS — D649 Anemia, unspecified: Secondary | ICD-10-CM | POA: Diagnosis not present

## 2023-01-06 LAB — BASIC METABOLIC PANEL
BUN: 26 mg/dL — ABNORMAL HIGH (ref 6–23)
CO2: 25 mEq/L (ref 19–32)
Calcium: 9.9 mg/dL (ref 8.4–10.5)
Chloride: 105 mEq/L (ref 96–112)
Creatinine, Ser: 1.27 mg/dL (ref 0.40–1.50)
GFR: 49.77 mL/min — ABNORMAL LOW (ref >60.00)
Glucose, Bld: 104 mg/dL — ABNORMAL HIGH (ref 70–99)
Potassium: 3.8 mEq/L (ref 3.5–5.1)
Sodium: 138 mEq/L (ref 135–145)

## 2023-01-06 LAB — LIPID PANEL
Cholesterol: 94 mg/dL (ref 0–200)
HDL: 36.7 mg/dL — ABNORMAL LOW (ref >39.00)
LDL Cholesterol: 47 mg/dL (ref 0–99)
NonHDL: 57.44
Total CHOL/HDL Ratio: 3
Triglycerides: 50 mg/dL (ref 0.0–149.0)
VLDL: 10 mg/dL (ref 0.0–40.0)

## 2023-01-06 LAB — CBC WITH DIFFERENTIAL/PLATELET
Basophils Absolute: 0.1 10*3/uL (ref 0.0–0.1)
Basophils Relative: 0.9 % (ref 0.0–3.0)
Eosinophils Absolute: 0.4 10*3/uL (ref 0.0–0.7)
Eosinophils Relative: 4.2 % (ref 0.0–5.0)
HCT: 37.8 % — ABNORMAL LOW (ref 39.0–52.0)
Hemoglobin: 12.7 g/dL — ABNORMAL LOW (ref 13.0–17.0)
Lymphocytes Relative: 54.2 % — ABNORMAL HIGH (ref 12.0–46.0)
Lymphs Abs: 5.3 10*3/uL — ABNORMAL HIGH (ref 0.7–4.0)
MCHC: 33.6 g/dL (ref 30.0–36.0)
MCV: 99.9 fl (ref 78.0–100.0)
Monocytes Absolute: 0.7 10*3/uL (ref 0.1–1.0)
Monocytes Relative: 7 % (ref 3.0–12.0)
Neutro Abs: 3.3 10*3/uL (ref 1.4–7.7)
Neutrophils Relative %: 33.7 % — ABNORMAL LOW (ref 43.0–77.0)
Platelets: 171 10*3/uL (ref 150.0–400.0)
RBC: 3.78 Mil/uL — ABNORMAL LOW (ref 4.22–5.81)
RDW: 14.2 % (ref 11.5–15.5)
WBC: 9.8 10*3/uL (ref 4.0–10.5)

## 2023-01-06 LAB — FERRITIN: Ferritin: 105.9 ng/mL (ref 22.0–322.0)

## 2023-01-06 LAB — HEPATIC FUNCTION PANEL
ALT: 15 U/L (ref 0–53)
AST: 22 U/L (ref 0–37)
Albumin: 4.5 g/dL (ref 3.5–5.2)
Alkaline Phosphatase: 68 U/L (ref 39–117)
Bilirubin, Direct: 0.2 mg/dL (ref 0.0–0.3)
Total Bilirubin: 0.8 mg/dL (ref 0.2–1.2)
Total Protein: 6.9 g/dL (ref 6.0–8.3)

## 2023-01-06 LAB — HEMOGLOBIN A1C: Hgb A1c MFr Bld: 6.3 % (ref 4.6–6.5)

## 2023-01-09 NOTE — Progress Notes (Signed)
Subjective:    Patient ID: [name] [name], male    DOB: October 08, 1930, 89 y.o.   MRN: [id number]  Patient here for  Chief Complaint  Patient presents with   Medical Management of Chronic Issues    HPI Here to follow up regarding his cholesterol, CAD and blood pressure.  Recent squamous cell cancer - removed. Saw Dr End - 12/08/22 - CAD s/p CABG - stable. S/p AVR - plan for repeat echo - when prosthesis is 89 years old.  Continue losartan and coreg.  Saw ortho 03/17/22 - f/u shoulder pain - AO left shoulder and rotator cuff tendinitis.  S/p injection. Lidoderm patches and gabapetnin.  300mg  dose gabapentin - too much.  Has 100mg  - taking 2 before bed.  Stays active.  No chest pain or sob reported.  No cough or congestion. No abdominal pain or bowel change reported.  Increased stress.  Discussed.  Family stress - stress with his wife's health issues.  Discussed labs.    Past Medical History:  Diagnosis Date   Aortic stenosis    Arthritis    right shoulder   BPH (benign prostatic hypertrophy)    Coronary artery disease    Gout    Heart murmur    History of chicken pox    Hypercholesterolemia    Hypertension    Systolic heart failure (HCC)    ischemic cardiomyopathy   Wears dentures    full upper and lower   Past Surgical History:  Procedure Laterality Date   AORTIC VALVE REPLACEMENT N/A 01/25/2019   Procedure: AORTIC VALVE REPLACEMENT (AVR) 25mm Inspiris valve;  Surgeon: Alleen Borne, MD;  Location: [facility] OR;  Service: Open Heart Surgery;  Laterality: N/A;   arm surgery     right arm fx s/p "plate insertion"   CARDIAC CATHETERIZATION     CATARACT EXTRACTION W/PHACO Right 11/17/2016   Procedure: CATARACT EXTRACTION PHACO AND INTRAOCULAR LENS PLACEMENT (IOC)  Right;  Surgeon: Lockie Mola, MD;  Location: [facility];  Service: Ophthalmology;  Laterality: Right;   CORONARY ARTERY BYPASS GRAFT N/A 01/25/2019   LIMA-LAD and sequential SVG-rPDA-rPL   DENTAL SURGERY      all teeth extracted   RIGHT HEART CATH AND CORONARY ANGIOGRAPHY N/A 01/02/2019   Procedure: RIGHT HEART CATH AND CORONARY ANGIOGRAPHY;  Surgeon: Yvonne Kendall, MD;  Location: [facility] INVASIVE CV LAB;  Service: Cardiovascular;  Laterality: N/A;   TEE WITHOUT CARDIOVERSION N/A 01/25/2019   Procedure: TRANSESOPHAGEAL ECHOCARDIOGRAM (TEE);  Surgeon: Alleen Borne, MD;  Location: [facility] OR;  Service: Open Heart Surgery;  Laterality: N/A;   Family History  Problem Relation Age of Onset   Leukemia Father    Congestive Heart Failure Mother    Cancer Daughter        Breast Cancer   Prostate cancer Neg Hx    Colon cancer Neg Hx    Social History   Socioeconomic History   Marital status: Married    Spouse name: Not on file   Number of children: Not on file   Years of education: Not on file   Highest education level: Not on file  Occupational History   Not on file  Tobacco Use   Smoking status: Never   Smokeless tobacco: Current    Types: Chew   Tobacco comments:    not ready to quit currently  Vaping Use   Vaping status: Never Used  Substance and Sexual Activity   Alcohol use: No    Alcohol/week: 0.0  standard drinks of alcohol   Drug use: No   Sexual activity: Not Currently  Other Topics Concern   Not on file  Social History Narrative   Not on file   Social Determinants of Health   Financial Resource Strain: Low Risk  (10/21/2021)   Overall Financial Resource Strain (CARDIA)    Difficulty of Paying Living Expenses: Not hard at all  Food Insecurity: No Food Insecurity (04/07/2022)   Hunger Vital Sign    Worried About Running Out of Food in the Last Year: Never true    Ran Out of Food in the Last Year: Never true  Transportation Needs: No Transportation Needs (04/07/2022)   PRAPARE - Administrator, Civil Service (Medical): No    Lack of Transportation (Non-Medical): No  Physical Activity: Unknown (09/15/2017)   Exercise Vital Sign    Days of Exercise per Week: 0 days     Minutes of Exercise per Session: Not on file  Stress: No Stress Concern Present (10/21/2021)   [employer] of Occupational Health - Occupational Stress Questionnaire    Feeling of Stress : Not at all  Social Connections: Unknown (10/21/2021)   Social Connection and Isolation Panel [NHANES]    Frequency of Communication with Friends and Family: Not on file    Frequency of Social Gatherings with Friends and Family: Not on file    Attends Religious Services: Not on file    Active Member of Clubs or Organizations: Not on file    Attends Banker Meetings: Not on file    Marital Status: Married     Review of Systems  Constitutional:  Negative for appetite change and unexpected weight change.  HENT:  Negative for congestion and sinus pressure.   Respiratory:  Negative for cough, chest tightness and shortness of breath.   Cardiovascular:  Negative for chest pain, palpitations and leg swelling.  Gastrointestinal:  Negative for abdominal pain, diarrhea, nausea and vomiting.  Genitourinary:  Negative for difficulty urinating and dysuria.  Musculoskeletal:  Negative for joint swelling and myalgias.  Skin:  Negative for color change and rash.  Neurological:  Negative for dizziness and headaches.  Psychiatric/Behavioral:  Negative for agitation and dysphoric mood.        Objective:     BP 138/70   Pulse 71   Temp 97.9 F (36.6 C)   Resp 16   Ht 5\' 10"  (1.778 m)   Wt 229 lb (103.9 kg)   SpO2 97%   BMI 32.86 kg/m  Wt Readings from Last 3 Encounters:  01/10/23 229 lb (103.9 kg)  12/08/22 232 lb (105.2 kg)  09/08/22 234 lb (106.1 kg)    Physical Exam Vitals reviewed.  Constitutional:      General: He is not in acute distress.    Appearance: Normal appearance. He is well-developed.  HENT:     Head: Normocephalic and atraumatic.     Right Ear: External ear normal.     Left Ear: External ear normal.  Eyes:     General: No scleral icterus.       Right eye: No  discharge.        Left eye: No discharge.     Conjunctiva/sclera: Conjunctivae normal.  Cardiovascular:     Rate and Rhythm: Normal rate and regular rhythm.  Pulmonary:     Effort: Pulmonary effort is normal. No respiratory distress.     Breath sounds: Normal breath sounds.  Abdominal:     General: Bowel sounds  are normal.     Palpations: Abdomen is soft.     Tenderness: There is no abdominal tenderness.  Musculoskeletal:        General: No swelling or tenderness.     Cervical back: Neck supple. No tenderness.  Lymphadenopathy:     Cervical: No cervical adenopathy.  Skin:    Findings: No erythema or rash.  Neurological:     Mental Status: He is alert.  Psychiatric:        Mood and Affect: Mood normal.        Behavior: Behavior normal.      Outpatient Encounter Medications as of 01/10/2023  Medication Sig   allopurinol (ZYLOPRIM) 100 MG tablet TAKE 1 TABLET EVERY DAY   amLODipine (NORVASC) 5 MG tablet TAKE 1 TABLET EVERY DAY   aspirin EC 81 MG tablet Take 1 tablet (81 mg total) by mouth daily.   atorvastatin (LIPITOR) 80 MG tablet TAKE 1 TABLET EVERY MORNING   carvedilol (COREG) 6.25 MG tablet TAKE 1 TABLET TWICE DAILY WITH MEALS   cyanocobalamin 1000 MCG tablet Take 1,000 mcg by mouth daily.   Fexofenadine HCl (ALLEGRA ALLERGY PO) Take 1 tablet by mouth daily as needed.   gabapentin (NEURONTIN) 100 MG capsule TAKE 2 CAPSULES AT BEDTIME   hydrochlorothiazide (MICROZIDE) 12.5 MG capsule TAKE 1 CAPSULE EVERY DAY   levothyroxine (SYNTHROID) 50 MCG tablet TAKE 1 TABLET EVERY DAY   LORazepam (ATIVAN) 1 MG tablet Takes 1/2 to 1 tablet q day prn   losartan (COZAAR) 100 MG tablet TAKE 1 TABLET EVERY DAY   No facility-administered encounter medications on file as of 01/10/2023.     Lab Results  Component Value Date   WBC 9.8 01/06/2023   HGB 12.7 (L) 01/06/2023   HCT 37.8 (L) 01/06/2023   PLT 171.0 01/06/2023   GLUCOSE 104 (H) 01/06/2023   CHOL 94 01/06/2023   TRIG 50.0  01/06/2023   HDL 36.70 (L) 01/06/2023   LDLCALC 47 01/06/2023   ALT 15 01/06/2023   AST 22 01/06/2023   NA 138 01/06/2023   K 3.8 01/06/2023   CL 105 01/06/2023   CREATININE 1.27 01/06/2023   BUN 26 (H) 01/06/2023   CO2 25 01/06/2023   TSH 2.43 05/04/2022   PSA 4.15 (H) 02/08/2014   INR 1.7 (H) 01/25/2019   HGBA1C 6.3 01/06/2023   MICROALBUR 31.9 (H) 09/06/2022    MR SHOULDER LEFT WO CONTRAST  Result Date: 03/22/2021 CLINICAL DATA:  Limited range of motion of the left arm since having shingles. No injury or prior surgery. EXAM: MRI OF THE LEFT SHOULDER WITHOUT CONTRAST TECHNIQUE: Multiplanar, multisequence MR imaging of the shoulder was performed. No intravenous contrast was administered. COMPARISON:  None. FINDINGS: Rotator cuff: Mild supraspinatus tendinosis with low-grade partial-thickness bursal surface tear anteriorly at the insertion. The infraspinatus, teres minor, and subscapularis tendons are intact. Muscles: Mild diffuse muscle edema involving the supraspinatus, infraspinatus, teres minor, and deltoid muscles. Mild diffuse rotator cuff muscle fatty infiltration without frank atrophy. Biceps long head:  Intact and normally positioned. Acromioclavicular Joint: Mild arthropathy of the acromioclavicular joint. Type I acromion. Trace subacromial/subdeltoid bursal fluid. Glenohumeral Joint: Small joint effusion. Extensive full-thickness cartilage loss over the humeral head and glenoid with subchondral marrow edema and cystic change. Labrum:  Diffusely degenerated. Bones: No acute fracture or dislocation. No suspicious bone lesion. Other: None. IMPRESSION: 1. Mild diffuse muscle edema involving the supraspinatus, infraspinatus, teres minor, and deltoid muscles. Findings are suggestive of acute denervation (brachial neuritis). 2.  Mild supraspinatus tendinosis with low-grade partial-thickness bursal surface tear anteriorly at the insertion. 3. 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 Electronically Signed   By: Obie Dredge M.D.   On: 03/22/2021 10:45       Assessment & Plan:  Anemia, unspecified type -     CBC with Differential/Platelet; Future -     TSH; Future -     IBC + Ferritin; Future -     Vitamin B12; Future  Hyperlipidemia LDL goal <70 Assessment & Plan: Continue lipitor.  Low cholesterol diet and exercise.  Follow lipid panel and liver function tests.   Orders: -     Lipid panel; Future -     Hepatic function panel; Future  Type 2 diabetes mellitus with other circulatory complication, without long-term current use of insulin (HCC) Assessment & Plan: On no medication.  Stay hydrated.  Low carb diet and exercise. Monitor sugars. Follow met b and a1c.  Lab Results  Component Value Date   HGBA1C 6.3 01/06/2023    Orders: -     Hemoglobin A1c; Future  Chronic HFrEF (heart failure with reduced ejection fraction) (HCC) Assessment & Plan: Continue losartan and coreg. Continues on hctz. Continue to monitor salt intake.  Follow metabolic panel.  Weigh daily.  Breathing stable.  No increased sob.    Stage 3a chronic kidney disease (HCC) Assessment & Plan: Avoid antiinflammatories.  Stay hydrated.  Follow metabolic panel.  Continue losartan. On hctz.  GFR 49.    Coronary artery disease involving native coronary artery of native heart without angina pectoris Assessment & Plan: Continue coreg, losartan and lipitor.  No chest pain.  Continue risk factor modification.    Dilatation of thoracic aorta [facility]) Assessment & Plan: S/p surgery.  Continues to be followed by cardiology - monitoring.    Left shoulder pain, unspecified chronicity Assessment & Plan: Has seen ortho.  Has seen neurology.  Persistent pain.  S/p injection and PT.  Gabapentin 100mg  - 2 qhs. Follow.     S/P aortic valve replacement Assessment & Plan: Last echo - normal functioning bioprosthetic valve.  Continue losartan and coreg.  Continue f/u with cardiology.     Hypothyroidism, unspecified type Assessment & Plan: On thyroid replacement.  Follow tsh.    Chronic gout without tophus, unspecified cause, unspecified site Assessment & Plan: Recent gout flare. Follow.  Remains on allopurinol.    Essential hypertension Assessment & Plan: Continue losartan, amlodipine and Coreg.  Amlodipine 5mg  q day.  On hctz.  Blood pressure as outlined.   Follow blood pressure. Follow metabolic panel.       Dale , MD

## 2023-01-10 ENCOUNTER — Ambulatory Visit (INDEPENDENT_AMBULATORY_CARE_PROVIDER_SITE_OTHER): Payer: Medicare HMO | Admitting: Internal Medicine

## 2023-01-10 ENCOUNTER — Encounter: Payer: Self-pay | Admitting: Internal Medicine

## 2023-01-10 VITALS — BP 138/70 | HR 71 | Temp 97.9°F | Resp 16 | Ht 70.0 in | Wt 229.0 lb

## 2023-01-10 DIAGNOSIS — I10 Essential (primary) hypertension: Secondary | ICD-10-CM

## 2023-01-10 DIAGNOSIS — E039 Hypothyroidism, unspecified: Secondary | ICD-10-CM

## 2023-01-10 DIAGNOSIS — I5022 Chronic systolic (congestive) heart failure: Secondary | ICD-10-CM | POA: Diagnosis not present

## 2023-01-10 DIAGNOSIS — E785 Hyperlipidemia, unspecified: Secondary | ICD-10-CM

## 2023-01-10 DIAGNOSIS — N1831 Chronic kidney disease, stage 3a: Secondary | ICD-10-CM

## 2023-01-10 DIAGNOSIS — I77810 Thoracic aortic ectasia: Secondary | ICD-10-CM

## 2023-01-10 DIAGNOSIS — M25512 Pain in left shoulder: Secondary | ICD-10-CM | POA: Diagnosis not present

## 2023-01-10 DIAGNOSIS — E1159 Type 2 diabetes mellitus with other circulatory complications: Secondary | ICD-10-CM | POA: Diagnosis not present

## 2023-01-10 DIAGNOSIS — I2510 Atherosclerotic heart disease of native coronary artery without angina pectoris: Secondary | ICD-10-CM

## 2023-01-10 DIAGNOSIS — Z952 Presence of prosthetic heart valve: Secondary | ICD-10-CM

## 2023-01-10 DIAGNOSIS — D649 Anemia, unspecified: Secondary | ICD-10-CM | POA: Diagnosis not present

## 2023-01-10 DIAGNOSIS — M1A9XX0 Chronic gout, unspecified, without tophus (tophi): Secondary | ICD-10-CM

## 2023-01-15 ENCOUNTER — Encounter: Payer: Self-pay | Admitting: Internal Medicine

## 2023-01-15 NOTE — Assessment & Plan Note (Signed)
Has seen ortho.  Has seen neurology.  Persistent pain.  S/p injection and PT.  Gabapentin 100mg  - 2 qhs. Follow.

## 2023-01-15 NOTE — Assessment & Plan Note (Signed)
Continue losartan and coreg. Continues on hctz. Continue to monitor salt intake.  Follow metabolic panel.  Weigh daily.  Breathing stable.  No increased sob.

## 2023-01-15 NOTE — Assessment & Plan Note (Signed)
Recent gout flare. Follow.  Remains on allopurinol.

## 2023-01-15 NOTE — Assessment & Plan Note (Signed)
Continue losartan, amlodipine and Coreg.  Amlodipine 5mg q day.  On hctz.  Blood pressure as outlined.   Follow blood pressure. Follow metabolic panel.  

## 2023-01-15 NOTE — Assessment & Plan Note (Signed)
Continue lipitor.  Low cholesterol diet and exercise.  Follow lipid panel and liver function tests.   

## 2023-01-15 NOTE — Assessment & Plan Note (Signed)
S/p surgery.  Continues to be followed by cardiology - monitoring.  

## 2023-01-15 NOTE — Assessment & Plan Note (Signed)
Continue coreg, losartan and lipitor.  No chest pain.  Continue risk factor modification.  

## 2023-01-15 NOTE — Assessment & Plan Note (Signed)
Last echo - normal functioning bioprosthetic valve.  Continue losartan and coreg.  Continue f/u with cardiology.  

## 2023-01-15 NOTE — Assessment & Plan Note (Signed)
On no medication.  Stay hydrated.  Low carb diet and exercise. Monitor sugars. Follow met b and a1c.  Lab Results  Component Value Date   HGBA1C 6.3 01/06/2023

## 2023-01-15 NOTE — Assessment & Plan Note (Signed)
On thyroid replacement.  Follow tsh.  

## 2023-01-15 NOTE — Assessment & Plan Note (Signed)
Avoid antiinflammatories.  Stay hydrated.  Follow metabolic panel.  Continue losartan. On hctz.  GFR 49.

## 2023-02-09 ENCOUNTER — Other Ambulatory Visit: Payer: Self-pay | Admitting: Internal Medicine

## 2023-02-22 DIAGNOSIS — Z08 Encounter for follow-up examination after completed treatment for malignant neoplasm: Secondary | ICD-10-CM | POA: Diagnosis not present

## 2023-02-22 DIAGNOSIS — L820 Inflamed seborrheic keratosis: Secondary | ICD-10-CM | POA: Diagnosis not present

## 2023-02-22 DIAGNOSIS — L570 Actinic keratosis: Secondary | ICD-10-CM | POA: Diagnosis not present

## 2023-02-22 DIAGNOSIS — L538 Other specified erythematous conditions: Secondary | ICD-10-CM | POA: Diagnosis not present

## 2023-02-22 DIAGNOSIS — D225 Melanocytic nevi of trunk: Secondary | ICD-10-CM | POA: Diagnosis not present

## 2023-02-22 DIAGNOSIS — L821 Other seborrheic keratosis: Secondary | ICD-10-CM | POA: Diagnosis not present

## 2023-02-22 DIAGNOSIS — Z85828 Personal history of other malignant neoplasm of skin: Secondary | ICD-10-CM | POA: Diagnosis not present

## 2023-02-22 DIAGNOSIS — B356 Tinea cruris: Secondary | ICD-10-CM | POA: Diagnosis not present

## 2023-02-23 ENCOUNTER — Other Ambulatory Visit: Payer: Self-pay | Admitting: Internal Medicine

## 2023-04-01 ENCOUNTER — Ambulatory Visit (INDEPENDENT_AMBULATORY_CARE_PROVIDER_SITE_OTHER): Payer: Medicare HMO

## 2023-04-01 DIAGNOSIS — Z23 Encounter for immunization: Secondary | ICD-10-CM

## 2023-04-15 ENCOUNTER — Ambulatory Visit: Payer: Medicare HMO

## 2023-04-23 ENCOUNTER — Other Ambulatory Visit: Payer: Self-pay | Admitting: Internal Medicine

## 2023-05-07 ENCOUNTER — Other Ambulatory Visit: Payer: Self-pay | Admitting: Internal Medicine

## 2023-05-10 ENCOUNTER — Other Ambulatory Visit (INDEPENDENT_AMBULATORY_CARE_PROVIDER_SITE_OTHER): Payer: Medicare HMO

## 2023-05-10 DIAGNOSIS — E785 Hyperlipidemia, unspecified: Secondary | ICD-10-CM

## 2023-05-10 DIAGNOSIS — D649 Anemia, unspecified: Secondary | ICD-10-CM | POA: Diagnosis not present

## 2023-05-10 DIAGNOSIS — E1159 Type 2 diabetes mellitus with other circulatory complications: Secondary | ICD-10-CM | POA: Diagnosis not present

## 2023-05-10 LAB — HEPATIC FUNCTION PANEL
ALT: 9 U/L (ref 0–53)
AST: 17 U/L (ref 0–37)
Albumin: 4.4 g/dL (ref 3.5–5.2)
Alkaline Phosphatase: 77 U/L (ref 39–117)
Bilirubin, Direct: 0.2 mg/dL (ref 0.0–0.3)
Total Bilirubin: 0.8 mg/dL (ref 0.2–1.2)
Total Protein: 6.5 g/dL (ref 6.0–8.3)

## 2023-05-10 LAB — LIPID PANEL
Cholesterol: 106 mg/dL (ref 0–200)
HDL: 33.5 mg/dL — ABNORMAL LOW (ref >39.00)
LDL Cholesterol: 60 mg/dL (ref 0–99)
NonHDL: 72.22
Total CHOL/HDL Ratio: 3
Triglycerides: 60 mg/dL (ref 0.0–149.0)
VLDL: 12 mg/dL (ref 0.0–40.0)

## 2023-05-10 LAB — TSH: TSH: 3.11 u[IU]/mL (ref 0.35–5.50)

## 2023-05-10 LAB — CBC WITH DIFFERENTIAL/PLATELET
Basophils Absolute: 0.1 10*3/uL (ref 0.0–0.1)
Basophils Relative: 0.9 % (ref 0.0–3.0)
Eosinophils Absolute: 0.3 10*3/uL (ref 0.0–0.7)
Eosinophils Relative: 2.9 % (ref 0.0–5.0)
HCT: 37 % — ABNORMAL LOW (ref 39.0–52.0)
Hemoglobin: 12.6 g/dL — ABNORMAL LOW (ref 13.0–17.0)
Lymphocytes Relative: 54.5 % — ABNORMAL HIGH (ref 12.0–46.0)
Lymphs Abs: 5.6 10*3/uL — ABNORMAL HIGH (ref 0.7–4.0)
MCHC: 34 g/dL (ref 30.0–36.0)
MCV: 100.7 fL — ABNORMAL HIGH (ref 78.0–100.0)
Monocytes Absolute: 0.7 10*3/uL (ref 0.1–1.0)
Monocytes Relative: 6.6 % (ref 3.0–12.0)
Neutro Abs: 3.6 10*3/uL (ref 1.4–7.7)
Neutrophils Relative %: 35.1 % — ABNORMAL LOW (ref 43.0–77.0)
Platelets: 161 10*3/uL (ref 150.0–400.0)
RBC: 3.68 Mil/uL — ABNORMAL LOW (ref 4.22–5.81)
RDW: 14.1 % (ref 11.5–15.5)
WBC: 10.3 10*3/uL (ref 4.0–10.5)

## 2023-05-10 LAB — HEMOGLOBIN A1C: Hgb A1c MFr Bld: 6.3 % (ref 4.6–6.5)

## 2023-05-10 LAB — IBC + FERRITIN
Ferritin: 83.2 ng/mL (ref 22.0–322.0)
Iron: 105 ug/dL (ref 42–165)
Saturation Ratios: 33.2 % (ref 20.0–50.0)
TIBC: 316.4 ug/dL (ref 250.0–450.0)
Transferrin: 226 mg/dL (ref 212.0–360.0)

## 2023-05-10 LAB — VITAMIN B12: Vitamin B-12: 764 pg/mL (ref 211–911)

## 2023-05-13 ENCOUNTER — Ambulatory Visit (INDEPENDENT_AMBULATORY_CARE_PROVIDER_SITE_OTHER): Payer: Medicare HMO | Admitting: Internal Medicine

## 2023-05-13 VITALS — BP 126/74 | HR 73 | Temp 98.2°F | Resp 16 | Ht 70.0 in | Wt 236.0 lb

## 2023-05-13 DIAGNOSIS — D649 Anemia, unspecified: Secondary | ICD-10-CM

## 2023-05-13 DIAGNOSIS — Z952 Presence of prosthetic heart valve: Secondary | ICD-10-CM

## 2023-05-13 DIAGNOSIS — N1831 Chronic kidney disease, stage 3a: Secondary | ICD-10-CM

## 2023-05-13 DIAGNOSIS — I77810 Thoracic aortic ectasia: Secondary | ICD-10-CM | POA: Diagnosis not present

## 2023-05-13 DIAGNOSIS — E1159 Type 2 diabetes mellitus with other circulatory complications: Secondary | ICD-10-CM | POA: Diagnosis not present

## 2023-05-13 DIAGNOSIS — E039 Hypothyroidism, unspecified: Secondary | ICD-10-CM | POA: Diagnosis not present

## 2023-05-13 DIAGNOSIS — E785 Hyperlipidemia, unspecified: Secondary | ICD-10-CM | POA: Diagnosis not present

## 2023-05-13 DIAGNOSIS — I5022 Chronic systolic (congestive) heart failure: Secondary | ICD-10-CM

## 2023-05-13 DIAGNOSIS — I2510 Atherosclerotic heart disease of native coronary artery without angina pectoris: Secondary | ICD-10-CM

## 2023-05-13 DIAGNOSIS — Z0000 Encounter for general adult medical examination without abnormal findings: Secondary | ICD-10-CM

## 2023-05-13 DIAGNOSIS — I10 Essential (primary) hypertension: Secondary | ICD-10-CM

## 2023-05-13 LAB — HM DIABETES FOOT EXAM

## 2023-05-13 NOTE — Assessment & Plan Note (Signed)
Physical today 05/13/23.  Colonoscopy 2012 - internal hemorrhoids.  No f/u recommended.  Declined prostate check and psa (previously)

## 2023-05-13 NOTE — Progress Notes (Unsigned)
Subjective:    Patient ID: [name] [name], male    DOB: 04/07/1931, 89 y.o.   MRN: [id number]  Patient here for  Chief Complaint  Patient presents with   Annual Exam    HPI Here for a physical exam. Saw Dr End - 12/08/22 - CAD s/p CABG - stable. S/p AVR - plan for repeat echo - when prosthesis is 87 years old.  Continue losartan and coreg. Saw ortho 03/17/22 - f/u shoulder pain - AO left shoulder and rotator cuff tendinitis. S/p injection. Lidoderm patches and gabapetnin. 300mg  dose gabapentin - too much. Has 100mg  - taking 2 before bed. Tries to stay active. No chest pain reported.  Breathing stable. No abdominal pain or bowel change reported. Increased stress. Discussed. Overall she feels she is doing relatively well.    Past Medical History:  Diagnosis Date   Aortic stenosis    Arthritis    right shoulder   BPH (benign prostatic hypertrophy)    Coronary artery disease    Gout    Heart murmur    History of chicken pox    Hypercholesterolemia    Hypertension    Systolic heart failure (HCC)    ischemic cardiomyopathy   Wears dentures    full upper and lower   Past Surgical History:  Procedure Laterality Date   AORTIC VALVE REPLACEMENT N/A 01/25/2019   Procedure: AORTIC VALVE REPLACEMENT (AVR) 25mm Inspiris valve;  Surgeon: Alleen Borne, MD;  Location: [facility] OR;  Service: Open Heart Surgery;  Laterality: N/A;   arm surgery     right arm fx s/p "plate insertion"   CARDIAC CATHETERIZATION     CATARACT EXTRACTION W/PHACO Right 11/17/2016   Procedure: CATARACT EXTRACTION PHACO AND INTRAOCULAR LENS PLACEMENT (IOC)  Right;  Surgeon: Lockie Mola, MD;  Location: [facility];  Service: Ophthalmology;  Laterality: Right;   CORONARY ARTERY BYPASS GRAFT N/A 01/25/2019   LIMA-LAD and sequential [id number]   DENTAL SURGERY     all teeth extracted   RIGHT HEART CATH AND CORONARY ANGIOGRAPHY N/A 01/02/2019   Procedure: RIGHT HEART CATH AND CORONARY ANGIOGRAPHY;  Surgeon:  Yvonne Kendall, MD;  Location: [facility];  Service: Cardiovascular;  Laterality: N/A;   TEE WITHOUT CARDIOVERSION N/A 01/25/2019   Procedure: TRANSESOPHAGEAL ECHOCARDIOGRAM (TEE);  Surgeon: Alleen Borne, MD;  Location: [facility] OR;  Service: Open Heart Surgery;  Laterality: N/A;   Family History  Problem Relation Age of Onset   Leukemia Father    Congestive Heart Failure Mother    Cancer Daughter        Breast Cancer   Prostate cancer Neg Hx    Colon cancer Neg Hx    Social History   Socioeconomic History   Marital status: Married    Spouse name: Not on file   Number of children: Not on file   Years of education: Not on file   Highest education level: Not on file  Occupational History   Not on file  Tobacco Use   Smoking status: Never   Smokeless tobacco: Current    Types: Chew   Tobacco comments:    not ready to quit currently  Vaping Use   Vaping status: Never Used  Substance and Sexual Activity   Alcohol use: No    Alcohol/week: 0.0 standard drinks of alcohol   Drug use: No   Sexual activity: Not Currently  Other Topics Concern   Not on file  Social History Narrative   Not  on file   Social Determinants of Health   Financial Resource Strain: Low Risk  (10/21/2021)   Overall Financial Resource Strain (CARDIA)    Difficulty of Paying Living Expenses: Not hard at all  Food Insecurity: No Food Insecurity (04/07/2022)   Hunger Vital Sign    Worried About Running Out of Food in the Last Year: Never true    Ran Out of Food in the Last Year: Never true  Transportation Needs: No Transportation Needs (04/07/2022)   PRAPARE - Administrator, Civil Service (Medical): No    Lack of Transportation (Non-Medical): No  Physical Activity: Unknown (09/15/2017)   Exercise Vital Sign    Days of Exercise per Week: 0 days    Minutes of Exercise per Session: Not on file  Stress: No Stress Concern Present (10/21/2021)   [employer] of Occupational Health -  Occupational Stress Questionnaire    Feeling of Stress : Not at all  Social Connections: Unknown (10/21/2021)   Social Connection and Isolation Panel [NHANES]    Frequency of Communication with Friends and Family: Not on file    Frequency of Social Gatherings with Friends and Family: Not on file    Attends Religious Services: Not on file    Active Member of Clubs or Organizations: Not on file    Attends Banker Meetings: Not on file    Marital Status: Married     Review of Systems  Constitutional:  Negative for appetite change and unexpected weight change.  HENT:  Negative for congestion, sinus pressure and sore throat.   Eyes:  Negative for pain and visual disturbance.  Respiratory:  Negative for cough, chest tightness and shortness of breath.   Cardiovascular:  Negative for chest pain and palpitations.  Gastrointestinal:  Negative for abdominal pain, diarrhea, nausea and vomiting.  Genitourinary:  Negative for difficulty urinating and dysuria.  Musculoskeletal:  Negative for joint swelling and myalgias.  Skin:  Negative for color change and rash.  Neurological:  Negative for dizziness and headaches.  Hematological:  Negative for adenopathy. Does not bruise/bleed easily.  Psychiatric/Behavioral:  Negative for agitation and dysphoric mood.        Objective:     BP 126/74   Pulse 73   Temp 98.2 F (36.8 C)   Resp 16   Ht 5\' 10"  (1.778 m)   Wt 236 lb (107 kg)   SpO2 98%   BMI 33.86 kg/m  Wt Readings from Last 3 Encounters:  05/13/23 236 lb (107 kg)  01/10/23 229 lb (103.9 kg)  12/08/22 232 lb (105.2 kg)    Physical Exam Constitutional:      General: He is not in acute distress.    Appearance: Normal appearance. He is well-developed.  HENT:     Head: Normocephalic and atraumatic.     Right Ear: External ear normal.     Left Ear: External ear normal.  Eyes:     General: No scleral icterus.       Right eye: No discharge.        Left eye: No discharge.      Conjunctiva/sclera: Conjunctivae normal.  Neck:     Thyroid: No thyromegaly.  Cardiovascular:     Rate and Rhythm: Normal rate and regular rhythm.  Pulmonary:     Effort: No respiratory distress.     Breath sounds: Normal breath sounds. No wheezing.  Abdominal:     General: Bowel sounds are normal.     Palpations: Abdomen  is soft.     Tenderness: There is no abdominal tenderness.  Musculoskeletal:        General: No swelling or tenderness.     Cervical back: Neck supple. No tenderness.  Lymphadenopathy:     Cervical: No cervical adenopathy.  Skin:    Findings: No erythema or rash.  Neurological:     Mental Status: He is alert and oriented to person, place, and time.  Psychiatric:        Mood and Affect: Mood normal.        Behavior: Behavior normal.      Outpatient Encounter Medications as of 05/13/2023  Medication Sig   allopurinol (ZYLOPRIM) 100 MG tablet TAKE 1 TABLET EVERY DAY   amLODipine (NORVASC) 5 MG tablet TAKE 1 TABLET EVERY DAY   aspirin EC 81 MG tablet Take 1 tablet (81 mg total) by mouth daily.   atorvastatin (LIPITOR) 80 MG tablet TAKE 1 TABLET EVERY MORNING   carvedilol (COREG) 6.25 MG tablet TAKE 1 TABLET TWICE DAILY WITH MEALS   cyanocobalamin 1000 MCG tablet Take 1,000 mcg by mouth daily.   Fexofenadine HCl (ALLEGRA ALLERGY PO) Take 1 tablet by mouth daily as needed.   gabapentin (NEURONTIN) 100 MG capsule TAKE 2 CAPSULES AT BEDTIME   hydrochlorothiazide (MICROZIDE) 12.5 MG capsule TAKE 1 CAPSULE EVERY DAY   levothyroxine (SYNTHROID) 50 MCG tablet TAKE 1 TABLET EVERY DAY   LORazepam (ATIVAN) 1 MG tablet Takes 1/2 to 1 tablet q day prn   losartan (COZAAR) 100 MG tablet TAKE 1 TABLET EVERY DAY   No facility-administered encounter medications on file as of 05/13/2023.     Lab Results  Component Value Date   WBC 10.3 05/10/2023   HGB 12.6 (L) 05/10/2023   HCT 37.0 (L) 05/10/2023   PLT 161.0 05/10/2023   GLUCOSE 104 (H) 01/06/2023   CHOL 106  05/10/2023   TRIG 60.0 05/10/2023   HDL 33.50 (L) 05/10/2023   LDLCALC 60 05/10/2023   ALT 9 05/10/2023   AST 17 05/10/2023   NA 138 01/06/2023   K 3.8 01/06/2023   CL 105 01/06/2023   CREATININE 1.27 01/06/2023   BUN 26 (H) 01/06/2023   CO2 25 01/06/2023   TSH 3.11 05/10/2023   PSA 4.15 (H) 02/08/2014   INR 1.7 (H) 01/25/2019   HGBA1C 6.3 05/10/2023   MICROALBUR 31.9 (H) 09/06/2022    MR SHOULDER LEFT WO CONTRAST  Result Date: 03/22/2021 CLINICAL DATA:  Limited range of motion of the left arm since having shingles. No injury or prior surgery. EXAM: MRI OF THE LEFT SHOULDER WITHOUT CONTRAST TECHNIQUE: Multiplanar, multisequence MR imaging of the shoulder was performed. No intravenous contrast was administered. COMPARISON:  None. FINDINGS: Rotator cuff: Mild supraspinatus tendinosis with low-grade partial-thickness bursal surface tear anteriorly at the insertion. The infraspinatus, teres minor, and subscapularis tendons are intact. Muscles: Mild diffuse muscle edema involving the supraspinatus, infraspinatus, teres minor, and deltoid muscles. Mild diffuse rotator cuff muscle fatty infiltration without frank atrophy. Biceps long head:  Intact and normally positioned. Acromioclavicular Joint: Mild arthropathy of the acromioclavicular joint. Type I acromion. Trace subacromial/subdeltoid bursal fluid. Glenohumeral Joint: Small joint effusion. Extensive full-thickness cartilage loss over the humeral head and glenoid with subchondral marrow edema and cystic change. Labrum:  Diffusely degenerated. Bones: No acute fracture or dislocation. No suspicious bone lesion. Other: None. IMPRESSION: 1. Mild diffuse muscle edema involving the supraspinatus, infraspinatus, teres minor, and deltoid muscles. Findings are suggestive of acute denervation (brachial neuritis). 2. Mild  supraspinatus tendinosis with low-grade partial-thickness bursal surface tear anteriorly at the insertion. 3. 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 Electronically Signed   By: Obie Dredge M.D.   On: 03/22/2021 10:45       Assessment & Plan:  Routine general medical examination at a health care facility  Health care maintenance Assessment & Plan: Physical today 05/13/23.  Colonoscopy 2012 - internal hemorrhoids.  No f/u recommended.  Declined prostate check and psa (previously)   Type 2 diabetes mellitus with other circulatory complication, without long-term current use of insulin (HCC) Assessment & Plan: On no medication.  Stay hydrated.  Low carb diet and exercise. Monitor sugars. Follow met b and a1c.  Lab Results  Component Value Date   HGBA1C 6.3 05/10/2023    Orders: -     Hemoglobin A1c; Future  Hyperlipidemia LDL goal <70 Assessment & Plan: Continue lipitor.  Low cholesterol diet and exercise.  Follow lipid panel and liver function tests.   Orders: -     Lipid panel; Future -     Hepatic function panel; Future -     Basic metabolic panel; Future  S/P aortic valve replacement Assessment & Plan: Last echo - normal functioning bioprosthetic valve.  Continue losartan and coreg.  Continue f/u with cardiology.    Hypothyroidism, unspecified type Assessment & Plan: On thyroid replacement.  Follow tsh.    Essential hypertension Assessment & Plan: Continue losartan, amlodipine and Coreg.  Amlodipine 5mg  q day.  On hctz.  Blood pressure as outlined.   Follow blood pressure. Follow metabolic panel.    Dilatation of thoracic aorta [facility]) Assessment & Plan: S/p surgery.  Continues to be followed by cardiology - monitoring.    Coronary artery disease involving native coronary artery of native heart without angina pectoris Assessment & Plan: Continue coreg, losartan and lipitor.  No chest pain.  Continue risk factor modification.    Stage 3a chronic kidney disease (HCC) Assessment & Plan: Avoid antiinflammatories.  Stay hydrated.  Follow metabolic panel.  Continue losartan.  On hctz.   Chronic HFrEF (heart failure with reduced ejection fraction) (HCC) Assessment & Plan: Continue losartan and coreg. Continues on hctz. Continue to monitor salt intake.  Follow metabolic panel.  Breathing stable.  No increased sob.    Anemia, unspecified type Assessment & Plan: Follow cbc and iron studies.       Dale Worth, MD

## 2023-05-15 ENCOUNTER — Encounter: Payer: Self-pay | Admitting: Internal Medicine

## 2023-05-15 NOTE — Assessment & Plan Note (Signed)
Last echo - normal functioning bioprosthetic valve.  Continue losartan and coreg.  Continue f/u with cardiology.

## 2023-05-15 NOTE — Assessment & Plan Note (Signed)
On thyroid replacement.  Follow tsh.  

## 2023-05-15 NOTE — Assessment & Plan Note (Signed)
Continue lipitor.  Low cholesterol diet and exercise.  Follow lipid panel and liver function tests.   

## 2023-05-15 NOTE — Assessment & Plan Note (Signed)
On no medication.  Stay hydrated.  Low carb diet and exercise. Monitor sugars. Follow met b and a1c.  Lab Results  Component Value Date   HGBA1C 6.3 05/10/2023

## 2023-05-15 NOTE — Assessment & Plan Note (Signed)
S/p surgery.  Continues to be followed by cardiology - monitoring.

## 2023-05-15 NOTE — Assessment & Plan Note (Signed)
Continue coreg, losartan and lipitor.  No chest pain.  Continue risk factor modification.

## 2023-05-15 NOTE — Assessment & Plan Note (Signed)
Follow cbc and iron studies.

## 2023-05-15 NOTE — Assessment & Plan Note (Signed)
Avoid antiinflammatories.  Stay hydrated.  Follow metabolic panel.  Continue losartan. On hctz.

## 2023-05-15 NOTE — Assessment & Plan Note (Signed)
Continue losartan, amlodipine and Coreg.  Amlodipine 5mg  q day.  On hctz.  Blood pressure as outlined.   Follow blood pressure. Follow metabolic panel.

## 2023-05-15 NOTE — Assessment & Plan Note (Signed)
Continue losartan and coreg. Continues on hctz. Continue to monitor salt intake.  Follow metabolic panel.  Breathing stable.  No increased sob.

## 2023-05-23 ENCOUNTER — Ambulatory Visit (INDEPENDENT_AMBULATORY_CARE_PROVIDER_SITE_OTHER): Payer: Medicare HMO | Admitting: *Deleted

## 2023-05-23 VITALS — Ht 70.0 in | Wt 236.0 lb

## 2023-05-23 DIAGNOSIS — Z0000 Encounter for general adult medical examination without abnormal findings: Secondary | ICD-10-CM

## 2023-05-23 NOTE — Progress Notes (Signed)
Subjective:   [name] [name] is a 89 y.o. male who presents for Medicare Annual/Subsequent preventive examination.  Visit Complete: Virtual I connected with  [name] on 05/23/23 by a audio enabled telemedicine application and verified that I am speaking with the correct person using two identifiers.  Patient Location: Home  Provider Location: Office/Clinic  I discussed the limitations of evaluation and management by telemedicine. The patient expressed understanding and agreed to proceed.  Vital Signs: Because this visit was a virtual/telehealth visit, some criteria may be missing or patient reported. Any vitals not documented were not able to be obtained and vitals that have been documented are patient reported.   Cardiac Risk Factors include: advanced age (>18men, >76 women);diabetes mellitus;dyslipidemia;hypertension;male gender;obesity (BMI >30kg/m2);Other (see comment), Risk factor comments: CAD     Objective:    Today's Vitals   05/23/23 1331  Weight: 236 lb (107 kg)  Height: 5\' 10"  (1.778 m)   Body mass index is 33.86 kg/m.     05/23/2023    1:46 PM 10/21/2021    9:12 AM 12/25/2020    3:49 PM 10/16/2020    9:49 AM 10/16/2019    9:43 AM 04/18/2019    4:15 PM 01/26/2019    6:00 PM  Advanced Directives  Does Patient Have a Medical Advance Directive? No No Yes No No No No  Type of Furniture conservator/restorer;Living will       Does patient want to make changes to medical advance directive?  No - Patient declined       Copy of Healthcare Power of Attorney in Chart?  No - copy requested       Would patient like information on creating a medical advance directive? No - Patient declined   No - Patient declined No - Patient declined No - Patient declined No - Patient declined    Current Medications (verified) Outpatient Encounter Medications as of 05/23/2023  Medication Sig   allopurinol (ZYLOPRIM) 100 MG tablet TAKE 1 TABLET EVERY DAY    amLODipine (NORVASC) 5 MG tablet TAKE 1 TABLET EVERY DAY   aspirin EC 81 MG tablet Take 1 tablet (81 mg total) by mouth daily.   atorvastatin (LIPITOR) 80 MG tablet TAKE 1 TABLET EVERY MORNING   carvedilol (COREG) 6.25 MG tablet TAKE 1 TABLET TWICE DAILY WITH MEALS   cyanocobalamin 1000 MCG tablet Take 1,000 mcg by mouth daily.   Fexofenadine HCl (ALLEGRA ALLERGY PO) Take 1 tablet by mouth daily as needed.   gabapentin (NEURONTIN) 100 MG capsule TAKE 2 CAPSULES AT BEDTIME   hydrochlorothiazide (MICROZIDE) 12.5 MG capsule TAKE 1 CAPSULE EVERY DAY   levothyroxine (SYNTHROID) 50 MCG tablet TAKE 1 TABLET EVERY DAY   LORazepam (ATIVAN) 1 MG tablet Takes 1/2 to 1 tablet q day prn   losartan (COZAAR) 100 MG tablet TAKE 1 TABLET EVERY DAY   No facility-administered encounter medications on file as of 05/23/2023.    Allergies (verified) Patient has no known allergies.   History: Past Medical History:  Diagnosis Date   Aortic stenosis    Arthritis    right shoulder   BPH (benign prostatic hypertrophy)    Coronary artery disease    Gout    Heart murmur    History of chicken pox    Hypercholesterolemia    Hypertension    Systolic heart failure (HCC)    ischemic cardiomyopathy   Wears dentures    full upper and lower   Past  Surgical History:  Procedure Laterality Date   AORTIC VALVE REPLACEMENT N/A 01/25/2019   Procedure: AORTIC VALVE REPLACEMENT (AVR) 25mm Inspiris valve;  Surgeon: Alleen Borne, MD;  Location: [facility] OR;  Service: Open Heart Surgery;  Laterality: N/A;   arm surgery     right arm fx s/p "plate insertion"   CARDIAC CATHETERIZATION     CATARACT EXTRACTION W/PHACO Right 11/17/2016   Procedure: CATARACT EXTRACTION PHACO AND INTRAOCULAR LENS PLACEMENT (IOC)  Right;  Surgeon: Lockie Mola, MD;  Location: [facility];  Service: Ophthalmology;  Laterality: Right;   CORONARY ARTERY BYPASS GRAFT N/A 01/25/2019   LIMA-LAD and sequential SVG-rPDA-rPL   DENTAL  SURGERY     all teeth extracted   RIGHT HEART CATH AND CORONARY ANGIOGRAPHY N/A 01/02/2019   Procedure: RIGHT HEART CATH AND CORONARY ANGIOGRAPHY;  Surgeon: Yvonne Kendall, MD;  Location: [facility] INVASIVE CV LAB;  Service: Cardiovascular;  Laterality: N/A;   skin cancer removed left hand  2024   TEE WITHOUT CARDIOVERSION N/A 01/25/2019   Procedure: TRANSESOPHAGEAL ECHOCARDIOGRAM (TEE);  Surgeon: Alleen Borne, MD;  Location: [facility] OR;  Service: Open Heart Surgery;  Laterality: N/A;   Family History  Problem Relation Age of Onset   Leukemia Father    Congestive Heart Failure Mother    Cancer Daughter        Breast Cancer   Prostate cancer Neg Hx    Colon cancer Neg Hx    Social History   Socioeconomic History   Marital status: Married    Spouse name: Not on file   Number of children: Not on file   Years of education: Not on file   Highest education level: Not on file  Occupational History   Not on file  Tobacco Use   Smoking status: Never   Smokeless tobacco: Current    Types: Chew   Tobacco comments:    not ready to quit currently  Vaping Use   Vaping status: Never Used  Substance and Sexual Activity   Alcohol use: No    Alcohol/week: 0.0 standard drinks of alcohol   Drug use: No   Sexual activity: Not Currently  Other Topics Concern   Not on file  Social History Narrative   Married   Social Determinants of Health   Financial Resource Strain: Low Risk  (05/23/2023)   Overall Financial Resource Strain (CARDIA)    Difficulty of Paying Living Expenses: Not hard at all  Food Insecurity: No Food Insecurity (05/23/2023)   Hunger Vital Sign    Worried About Running Out of Food in the Last Year: Never true    Ran Out of Food in the Last Year: Never true  Transportation Needs: No Transportation Needs (05/23/2023)   PRAPARE - Administrator, Civil Service (Medical): No    Lack of Transportation (Non-Medical): No  Physical Activity: Inactive (05/23/2023)    Exercise Vital Sign    Days of Exercise per Week: 0 days    Minutes of Exercise per Session: 0 min  Stress: No Stress Concern Present (05/23/2023)   [employer] of Occupational Health - Occupational Stress Questionnaire    Feeling of Stress : Not at all  Social Connections: Moderately Integrated (05/23/2023)   Social Connection and Isolation Panel [NHANES]    Frequency of Communication with Friends and Family: More than three times a week    Frequency of Social Gatherings with Friends and Family: Twice a week    Attends Religious Services: More than 4  times per year    Active Member of Clubs or Organizations: No    Attends Banker Meetings: Never    Marital Status: Married    Tobacco Counseling Ready to quit: Not Answered Counseling given: Not Answered Tobacco comments: not ready to quit currently   Clinical Intake:  Pre-visit preparation completed: Yes  Pain : No/denies pain     BMI - recorded: 33.86 Nutritional Status: BMI > 30  Obese Nutritional Risks: None Diabetes: No  How often do you need to have someone help you when you read instructions, pamphlets, or other written materials from your doctor or pharmacy?: 1 - Never  Interpreter Needed?: No  Information entered by :: R. Tahj Lindseth LPN   Activities of Daily Living    05/23/2023    1:33 PM  In your present state of health, do you have any difficulty performing the following activities:  Hearing? 0  Vision? 0  Comment readers  Difficulty concentrating or making decisions? 0  Walking or climbing stairs? 0  Dressing or bathing? 0  Doing errands, shopping? 0  Preparing Food and eating ? N  Using the Toilet? N  In the past six months, have you accidently leaked urine? N  Do you have problems with loss of bowel control? N  Managing your Medications? N  Managing your Finances? N  Housekeeping or managing your Housekeeping? N    Patient Care Team: Dale Millville, MD as PCP - General (Internal  Medicine) End, Cristal Deer, MD as PCP - Cardiology (Cardiology)  Indicate any recent Medical Services you may have received from other than [facility] providers in the past year (date may be approximate).     Assessment:   This is a routine wellness examination for [place].  Hearing/Vision screen Hearing Screening - Comments:: No issues Vision Screening - Comments:: readers   Goals Addressed             This Visit's Progress    Patient Stated       Wants to stay active       Depression Screen    05/23/2023    1:42 PM 09/08/2022    1:20 PM 02/01/2022    2:32 PM 12/01/2021    4:13 PM 10/21/2021    9:08 AM 10/16/2020    9:55 AM 10/08/2020   11:00 AM  PHQ 2/9 Scores  PHQ - 2 Score 0 0 0 0 0 0 0  PHQ- 9 Score 1          Fall Risk    05/23/2023    1:34 PM 09/08/2022    1:19 PM 02/01/2022    2:31 PM 12/01/2021    4:13 PM 10/21/2021    9:13 AM  Fall Risk   Falls in the past year? 1 0 0 0 0  Number falls in past yr: 0 0 0  0  Injury with Fall? 1 0 0    Comment hit head but did not have to see a doctor      Risk for fall due to : History of fall(s);Impaired balance/gait No Fall Risks No Fall Risks No Fall Risks   Follow up Falls evaluation completed;Falls prevention discussed Falls evaluation completed Falls evaluation completed Falls evaluation completed Falls evaluation completed    MEDICARE RISK AT HOME: Medicare Risk at Home Any stairs in or around the home?: Yes If so, are there any without handrails?: No Home free of loose throw rugs in walkways, pet beds, electrical cords, etc?: Yes Adequate lighting  in your home to reduce risk of falls?: Yes Life alert?: No Use of a cane, walker or w/c?: No Grab bars in the bathroom?: Yes Shower chair or bench in shower?: No Elevated toilet seat or a handicapped toilet?: Yes      Cognitive Function:    09/14/2016    8:40 AM 09/15/2015    8:20 AM  MMSE - Mini Mental State Exam  Orientation to time 5 5  Orientation to Place 5 5   Registration 3 3  Attention/ Calculation 5 5  Recall 3 3  Language- name 2 objects 2 2  Language- repeat 1 1  Language- follow 3 step command 3 3  Language- read & follow direction 1 1  Write a sentence 1 1  Copy design 1 1  Total score 30 30        05/23/2023    1:48 PM 10/21/2021   11:49 AM 10/16/2019    9:48 AM 10/09/2018   10:14 AM 09/15/2017    8:46 AM  6CIT Screen  What Year? 0 points 0 points 0 points 0 points 0 points  What month? 0 points 0 points 0 points 0 points 0 points  What time? 0 points 0 points 0 points 0 points 0 points  Count back from 20 0 points 0 points  0 points 0 points  Months in reverse 0 points 0 points 0 points 0 points 0 points  Repeat phrase 0 points 0 points  0 points 0 points  Total Score 0 points 0 points  0 points 0 points    Immunizations Immunization History  Administered Date(s) Administered   Fluad Quad(high Dose 65+) 04/11/2019, 03/05/2020, 04/09/2021, 05/06/2022   Fluad Trivalent(High Dose 65+) 04/01/2023   Influenza Split 05/02/2012   Influenza, High Dose Seasonal PF 03/19/2016, 03/18/2017, 05/22/2018   Influenza,inj,Quad PF,6+ Mos 05/01/2013, 02/13/2014, 05/08/2015   PFIZER(Purple Top)SARS-COV-2 Vaccination 07/17/2019, 08/07/2019, 04/10/2020   Pneumococcal Conjugate-13 10/05/2013   Pneumococcal Polysaccharide-23 11/05/2014   Tdap 03/02/2021    TDAP status: Up to date  Flu Vaccine status: Up to date  Pneumococcal vaccine status: Up to date  Covid-19 vaccine status: Information provided on how to obtain vaccines.   Qualifies for Shingles Vaccine? Yes   Zostavax completed No   Shingrix Completed?: No.    Education has been provided regarding the importance of this vaccine. Patient has been advised to call insurance company to determine out of pocket expense if they have not yet received this vaccine. Advised may also receive vaccine at local pharmacy or Health Dept. Verbalized acceptance and understanding.  Screening  Tests Health Maintenance  Topic Date Due   OPHTHALMOLOGY EXAM  09/03/2022   COVID-19 Vaccine (4 - 2023-24 season) 05/29/2023 (Originally 02/20/2023)   Zoster Vaccines- Shingrix (1 of 2) 08/13/2023 (Originally 08/09/1949)   HEMOGLOBIN A1C  11/07/2023   FOOT EXAM  05/12/2024   Medicare Annual Wellness (AWV)  05/22/2024   DTaP/Tdap/Td (2 - Td or Tdap) 03/03/2031   Pneumonia Vaccine 72+ Years old  Completed   INFLUENZA VACCINE  Completed   HPV VACCINES  Aged Out    Health Maintenance  Health Maintenance Due  Topic Date Due   OPHTHALMOLOGY EXAM  09/03/2022    Colorectal cancer screening: No longer required.   Lung Cancer Screening: (Low Dose CT Chest recommended if Age 54-80 years, 20 pack-year currently smoking OR have quit w/in 15years.) does not qualify.    Additional Screening:  Hepatitis C Screening: does not qualify; Completed NA age  Vision Screening: Recommended annual ophthalmology exams for early detection of glaucoma and other disorders of the eye. Is the patient up to date with their annual eye exam?  Yes  Who is the provider or what is the name of the office in which the patient attends annual eye exams? [facility] If pt is not established with a provider, would they like to be referred to a provider to establish care? No .   Dental Screening: Recommended annual dental exams for proper oral hygiene  Diabetic Foot Exam: Diabetic Foot Exam: Completed 04/2023  Community Resource Referral / Chronic Care Management: CRR required this visit?  No   CCM required this visit?  No     Plan:     I have personally reviewed and noted the following in the patient's chart:   Medical and social history Use of alcohol, tobacco or illicit drugs  Current medications and supplements including opioid prescriptions. Patient is not currently taking opioid prescriptions. Functional ability and status Nutritional status Physical activity Advanced directives List of other  physicians Hospitalizations, surgeries, and ER visits in previous 12 months Vitals Screenings to include cognitive, depression, and falls Referrals and appointments  In addition, I have reviewed and discussed with patient certain preventive protocols, quality metrics, and best practice recommendations. A written personalized care plan for preventive services as well as general preventive health recommendations were provided to patient.     Sydell Axon, LPN   16/06/958   After Visit Summary: (Pick Up) Due to this being a telephonic visit, with patients personalized plan was offered to patient and patient has requested to Pick up at office.  Nurse Notes: None

## 2023-05-23 NOTE — Patient Instructions (Signed)
[name] , Thank you for taking time to come for your Medicare Wellness Visit. I appreciate your ongoing commitment to your health goals. Please review the following plan we discussed and let me know if I can assist you in the future.   Referrals/Orders/Follow-Ups/Clinician Recommendations: Consider getting the shingles vaccine  This is a list of the screening recommended for you and due dates:  Health Maintenance  Topic Date Due   Eye exam for diabetics  09/03/2022   COVID-19 Vaccine (4 - 2023-24 season) 05/29/2023*   Zoster (Shingles) Vaccine (1 of 2) 08/13/2023*   Hemoglobin A1C  11/07/2023   Complete foot exam   05/12/2024   Medicare Annual Wellness Visit  05/22/2024   DTaP/Tdap/Td vaccine (2 - Td or Tdap) 03/03/2031   Pneumonia Vaccine  Completed   Flu Shot  Completed   HPV Vaccine  Aged Out  *Topic was postponed. The date shown is not the original due date.    Advanced directives: (Declined) Advance directive discussed with you today. Even though you declined this today, please call our office should you change your mind, and we can give you the proper paperwork for you to fill out.  Next Medicare Annual Wellness Visit scheduled for next year: Yes 1/3/225 @ 1:40

## 2023-06-09 ENCOUNTER — Encounter: Payer: Self-pay | Admitting: Internal Medicine

## 2023-06-09 ENCOUNTER — Ambulatory Visit: Payer: Medicare HMO | Attending: Internal Medicine | Admitting: Internal Medicine

## 2023-06-09 VITALS — BP 122/60 | HR 69 | Ht 70.0 in | Wt 234.6 lb

## 2023-06-09 DIAGNOSIS — I350 Nonrheumatic aortic (valve) stenosis: Secondary | ICD-10-CM

## 2023-06-09 DIAGNOSIS — E785 Hyperlipidemia, unspecified: Secondary | ICD-10-CM

## 2023-06-09 DIAGNOSIS — I255 Ischemic cardiomyopathy: Secondary | ICD-10-CM | POA: Diagnosis not present

## 2023-06-09 DIAGNOSIS — I10 Essential (primary) hypertension: Secondary | ICD-10-CM

## 2023-06-09 DIAGNOSIS — I2510 Atherosclerotic heart disease of native coronary artery without angina pectoris: Secondary | ICD-10-CM | POA: Diagnosis not present

## 2023-06-09 DIAGNOSIS — I5022 Chronic systolic (congestive) heart failure: Secondary | ICD-10-CM | POA: Diagnosis not present

## 2023-06-09 NOTE — Patient Instructions (Signed)
Medication Instructions:  No changes *If you need a refill on your cardiac medications before your next appointment, please call your pharmacy*   Lab Work: None ordered If you have labs (blood work) drawn today and your tests are completely normal, you will receive your results only by: MyChart Message (if you have MyChart) OR A paper copy in the mail If you have any lab test that is abnormal or we need to change your treatment, we will call you to review the results.   Testing/Procedures: None ordered   Follow-Up: At [facility], you and your health needs are our priority.  As part of our continuing mission to provide you with exceptional heart care, we have created designated Provider Care Teams.  These Care Teams include your primary Cardiologist (physician) and Advanced Practice Providers (APPs -  Physician Assistants and Nurse Practitioners) who all work together to provide you with the care you need, when you need it.  We recommend signing up for the patient portal called "MyChart".  Sign up information is provided on this After Visit Summary.  MyChart is used to connect with patients for Virtual Visits (Telemedicine).  Patients are able to view lab/test results, encounter notes, upcoming appointments, etc.  Non-urgent messages can be sent to your provider as well.   To learn more about what you can do with MyChart, go to [email or web address].    Your next appointment:   6 month(s)  Provider:   You may see Yvonne Kendall, MD or one of the following Advanced Practice Providers on your designated Care Team:   Nicolasa Ducking, NP Eula Listen, PA-C Cadence Fransico Michael, PA-C Charlsie Quest, NP Carlos Levering, NP

## 2023-06-09 NOTE — Progress Notes (Signed)
Cardiology Office Note:  .   Date:  06/09/2023  ID:  [name] [name], DOB 10/05/1930, MRN [id number] PCP: Dale Baldwyn, MD  [facility] Providers Cardiologist:  Yvonne Kendall, MD     History of Present Illness: .   [name] [name] is a 89 y.o. male with history of coronary artery disease and systolic heart failure secondary to ischemic cardiomyopathy complicated by severe aortic stenosis status post CABG (LIMA to LAD and sequential SVG to RPDA and RPL) and bioprosthetic aortic valve replacement on 01/25/2019, hypertension, hyperlipidemia, prediabetes, asthma, arthritis, gout, and BPH, who presents for follow-up of coronary artery disease, heart failure with recovered ejection fraction, and valvular heart disease.  I last saw him in June, at which time he was feeling fairly well, though he noted some fatigue and was wondering if his medications could be contributing to this.  We agreed to defer medication changes and additional testing.  Today, [name] [name] reports feeling fairly well, similar to our last visit.  He still notes some fatigue, though overall it is unchanged from her last visit.  He has not had any chest pain, shortness of breath, palpitations, lightheadedness, or edema.  He is tolerating his medications well.  He does not exercise regularly but remains very active at home and walks to his shop several times a day without difficulty.  ROS: See HPI  Studies Reviewed: Marland Kitchen   EKG Interpretation Date/Time:  Thursday June 09 2023 08:42:53 EST Ventricular Rate:  69 PR Interval:  188 QRS Duration:  128 QT Interval:  428 QTC Calculation: 458 R Axis:   -69  Text Interpretation: Normal sinus rhythm with sinus arrhythmia Left axis deviation Left ventricular hypertrophy with QRS widening and repolarization abnormality ( R in aVL , Cornell product ) Cannot rule out Septal infarct , age undetermined When compared with ECG of 08-Dec-2022 No significant change was found  Confirmed by Kristy Catoe, Cristal Deer [id number]) on 06/09/2023 9:13:21 AM    Risk Assessment/Calculations:             Physical Exam:   VS:  BP 122/60 (BP Location: Right Arm, Patient Position: Sitting, Cuff Size: Normal)   Pulse 69   Ht 5\' 10"  (1.778 m)   Wt 234 lb 9.6 oz (106.4 kg)   SpO2 96%   BMI 33.66 kg/m    Wt Readings from Last 3 Encounters:  06/09/23 234 lb 9.6 oz (106.4 kg)  05/23/23 236 lb (107 kg)  05/13/23 236 lb (107 kg)    General:  NAD. Neck: No JVD or HJR. Lungs: Clear to auscultation bilaterally without wheezes or crackles. Heart: Regular rate and rhythm without murmurs, rubs, or gallops. Abdomen: Soft, nontender, nondistended. Extremities: No lower extremity edema.  ASSESSMENT AND PLAN: .    Coronary artery disease without angina and hyperlipidemia: [name] [name] continues to do well without recurrent angina status post CABG in 2020.  Continue aspirin and atorvastatin for secondary prevention.  Severe aortic stenosis status post bioprosthetic AVR: No symptoms of heart failure.  Continue clinical follow-up with plans for follow-up echocardiogram at 10 years after AVR, sooner if symptoms develop.  Patient has dentures and does not necessitate AVR as he does not undergo dental procedures/cleanings.  Chronic HFrEF with recovered ejection fraction due to ischemic cardiomyopathy: [name] [name] appears euvolemic.  Continue current regimen of losartan, carvedilol, and HCTZ.  Hypertension: Blood pressure well-controlled today.  Continue current regimen of amlodipine, carvedilol, HCTZ, and losartan.    Dispo: Return to clinic in 6 months.  Signed, Yvonne Kendall, MD

## 2023-08-06 ENCOUNTER — Other Ambulatory Visit: Payer: Self-pay | Admitting: Internal Medicine

## 2023-08-30 DIAGNOSIS — H353131 Nonexudative age-related macular degeneration, bilateral, early dry stage: Secondary | ICD-10-CM | POA: Diagnosis not present

## 2023-08-30 DIAGNOSIS — H26491 Other secondary cataract, right eye: Secondary | ICD-10-CM | POA: Diagnosis not present

## 2023-08-30 DIAGNOSIS — H2512 Age-related nuclear cataract, left eye: Secondary | ICD-10-CM | POA: Diagnosis not present

## 2023-09-08 ENCOUNTER — Other Ambulatory Visit (INDEPENDENT_AMBULATORY_CARE_PROVIDER_SITE_OTHER): Payer: Medicare HMO

## 2023-09-08 DIAGNOSIS — E785 Hyperlipidemia, unspecified: Secondary | ICD-10-CM | POA: Diagnosis not present

## 2023-09-08 DIAGNOSIS — E1159 Type 2 diabetes mellitus with other circulatory complications: Secondary | ICD-10-CM | POA: Diagnosis not present

## 2023-09-08 LAB — HEMOGLOBIN A1C: Hgb A1c MFr Bld: 6.3 % (ref 4.6–6.5)

## 2023-09-08 LAB — LIPID PANEL
Cholesterol: 98 mg/dL (ref 0–200)
HDL: 38.2 mg/dL — ABNORMAL LOW (ref >39.00)
LDL Cholesterol: 50 mg/dL (ref 0–99)
NonHDL: 59.31
Total CHOL/HDL Ratio: 3
Triglycerides: 46 mg/dL (ref 0.0–149.0)
VLDL: 9.2 mg/dL (ref 0.0–40.0)

## 2023-09-08 LAB — BASIC METABOLIC PANEL
BUN: 22 mg/dL (ref 6–23)
CO2: 26 meq/L (ref 19–32)
Calcium: 9.5 mg/dL (ref 8.4–10.5)
Chloride: 104 meq/L (ref 96–112)
Creatinine, Ser: 1.25 mg/dL (ref 0.40–1.50)
GFR: 50.49 mL/min — ABNORMAL LOW (ref >60.00)
Glucose, Bld: 114 mg/dL — ABNORMAL HIGH (ref 70–99)
Potassium: 3.8 meq/L (ref 3.5–5.1)
Sodium: 137 meq/L (ref 135–145)

## 2023-09-08 LAB — HEPATIC FUNCTION PANEL
ALT: 12 U/L (ref 0–53)
AST: 20 U/L (ref 0–37)
Albumin: 4.4 g/dL (ref 3.5–5.2)
Alkaline Phosphatase: 66 U/L (ref 39–117)
Bilirubin, Direct: 0.1 mg/dL (ref 0.0–0.3)
Total Bilirubin: 0.7 mg/dL (ref 0.2–1.2)
Total Protein: 6.8 g/dL (ref 6.0–8.3)

## 2023-09-12 ENCOUNTER — Ambulatory Visit: Payer: Medicare HMO | Admitting: Internal Medicine

## 2023-09-12 VITALS — BP 128/72 | HR 72 | Temp 97.9°F | Resp 16 | Ht 70.0 in | Wt 241.6 lb

## 2023-09-12 DIAGNOSIS — I2510 Atherosclerotic heart disease of native coronary artery without angina pectoris: Secondary | ICD-10-CM | POA: Diagnosis not present

## 2023-09-12 DIAGNOSIS — D649 Anemia, unspecified: Secondary | ICD-10-CM

## 2023-09-12 DIAGNOSIS — E1159 Type 2 diabetes mellitus with other circulatory complications: Secondary | ICD-10-CM | POA: Diagnosis not present

## 2023-09-12 DIAGNOSIS — E039 Hypothyroidism, unspecified: Secondary | ICD-10-CM

## 2023-09-12 DIAGNOSIS — N1831 Chronic kidney disease, stage 3a: Secondary | ICD-10-CM | POA: Diagnosis not present

## 2023-09-12 DIAGNOSIS — I77810 Thoracic aortic ectasia: Secondary | ICD-10-CM

## 2023-09-12 DIAGNOSIS — E785 Hyperlipidemia, unspecified: Secondary | ICD-10-CM

## 2023-09-12 DIAGNOSIS — I10 Essential (primary) hypertension: Secondary | ICD-10-CM | POA: Diagnosis not present

## 2023-09-12 DIAGNOSIS — I5022 Chronic systolic (congestive) heart failure: Secondary | ICD-10-CM | POA: Diagnosis not present

## 2023-09-12 DIAGNOSIS — F439 Reaction to severe stress, unspecified: Secondary | ICD-10-CM

## 2023-09-12 DIAGNOSIS — Z952 Presence of prosthetic heart valve: Secondary | ICD-10-CM

## 2023-09-12 MED ORDER — LORAZEPAM 1 MG PO TABS: ORAL_TABLET | ORAL | 1 refills | Status: DC

## 2023-09-12 NOTE — Progress Notes (Unsigned)
 Subjective:    Patient ID: [name] [name], male    DOB: 15-Aug-1930, 89 y.o.   MRN: [id number]  Patient here for  Chief Complaint  Patient presents with  . Medical Management of Chronic Issues    HPI Here for a scheduled follow up - follow up regarding hypercholesterolemia, CAD, diabetes and hypertension. Had f/u with Dr End 06/09/23 - stable. Recommended to continue aspirin and lipitor. ECHO 10 years after AVR.    Past Medical History:  Diagnosis Date  . Aortic stenosis   . Arthritis    right shoulder  . BPH (benign prostatic hypertrophy)   . Coronary artery disease   . Gout   . Heart murmur   . History of chicken pox   . Hypercholesterolemia   . Hypertension   . Systolic heart failure (HCC)    ischemic cardiomyopathy  . Wears dentures    full upper and lower   Past Surgical History:  Procedure Laterality Date  . AORTIC VALVE REPLACEMENT N/A 01/25/2019   Procedure: AORTIC VALVE REPLACEMENT (AVR) 25mm Inspiris valve;  Surgeon: Alleen Borne, MD;  Location: [facility] OR;  Service: Open Heart Surgery;  Laterality: N/A;  . arm surgery     right arm fx s/p "plate insertion"  . CARDIAC CATHETERIZATION    . CATARACT EXTRACTION W/PHACO Right 11/17/2016   Procedure: CATARACT EXTRACTION PHACO AND INTRAOCULAR LENS PLACEMENT (IOC)  Right;  Surgeon: Lockie Mola, MD;  Location: [facility];  Service: Ophthalmology;  Laterality: Right;  . CORONARY ARTERY BYPASS GRAFT N/A 01/25/2019   LIMA-LAD and sequential SVG-rPDA-rPL  . DENTAL SURGERY     all teeth extracted  . RIGHT HEART CATH AND CORONARY ANGIOGRAPHY N/A 01/02/2019   Procedure: RIGHT HEART CATH AND CORONARY ANGIOGRAPHY;  Surgeon: Yvonne Kendall, MD;  Location: [facility] INVASIVE CV LAB;  Service: Cardiovascular;  Laterality: N/A;  . skin cancer removed left hand  2024  . TEE WITHOUT CARDIOVERSION N/A 01/25/2019   Procedure: TRANSESOPHAGEAL ECHOCARDIOGRAM (TEE);  Surgeon: Alleen Borne, MD;  Location: [facility] OR;   Service: Open Heart Surgery;  Laterality: N/A;   Family History  Problem Relation Age of Onset  . Leukemia Father   . Congestive Heart Failure Mother   . Cancer Daughter        Breast Cancer  . Prostate cancer Neg Hx   . Colon cancer Neg Hx    Social History   Socioeconomic History  . Marital status: Married    Spouse name: Not on file  . Number of children: Not on file  . Years of education: Not on file  . Highest education level: Not on file  Occupational History  . Not on file  Tobacco Use  . Smoking status: Never  . Smokeless tobacco: Current    Types: Chew  . Tobacco comments:    not ready to quit currently  Vaping Use  . Vaping status: Never Used  Substance and Sexual Activity  . Alcohol use: No    Alcohol/week: 0.0 standard drinks of alcohol  . Drug use: No  . Sexual activity: Not Currently  Other Topics Concern  . Not on file  Social History Narrative   Married   Social Drivers of Health   Financial Resource Strain: Low Risk  (05/23/2023)   Overall Financial Resource Strain (CARDIA)   . Difficulty of Paying Living Expenses: Not hard at all  Food Insecurity: No Food Insecurity (05/23/2023)   Hunger Vital Sign   . Worried About Running  Out of Food in the Last Year: Never true   . Ran Out of Food in the Last Year: Never true  Transportation Needs: No Transportation Needs (05/23/2023)   PRAPARE - Transportation   . Lack of Transportation (Medical): No   . Lack of Transportation (Non-Medical): No  Physical Activity: Inactive (05/23/2023)   Exercise Vital Sign   . Days of Exercise per Week: 0 days   . Minutes of Exercise per Session: 0 min  Stress: No Stress Concern Present (05/23/2023)   [employer] of Occupational Health - Occupational Stress Questionnaire   . Feeling of Stress : Not at all  Social Connections: Moderately Integrated (05/23/2023)   Social Connection and Isolation Panel [NHANES]   . Frequency of Communication with Friends and Family:  More than three times a week   . Frequency of Social Gatherings with Friends and Family: Twice a week   . Attends Religious Services: More than 4 times per year   . Active Member of Clubs or Organizations: No   . Attends Banker Meetings: Never   . Marital Status: Married     Review of Systems     Objective:     BP 128/72   Pulse 72   Temp 97.9 F (36.6 C)   Resp 16   Ht 5\' 10"  (1.778 m)   Wt 241 lb 9.6 oz (109.6 kg)   BMI 34.67 kg/m  Wt Readings from Last 3 Encounters:  09/12/23 241 lb 9.6 oz (109.6 kg)  06/09/23 234 lb 9.6 oz (106.4 kg)  05/23/23 236 lb (107 kg)    Physical Exam  {Perform Simple Foot Exam  Perform Detailed exam:1} {Insert foot Exam (Optional):30965}   Outpatient Encounter Medications as of 09/12/2023  Medication Sig  . allopurinol (ZYLOPRIM) 100 MG tablet TAKE 1 TABLET EVERY DAY  . amLODipine (NORVASC) 5 MG tablet TAKE 1 TABLET EVERY DAY  . aspirin EC 81 MG tablet Take 1 tablet (81 mg total) by mouth daily.  Marland Kitchen atorvastatin (LIPITOR) 80 MG tablet TAKE 1 TABLET EVERY MORNING  . carvedilol (COREG) 6.25 MG tablet TAKE 1 TABLET TWICE DAILY WITH MEALS  . cyanocobalamin 1000 MCG tablet Take 1,000 mcg by mouth daily.  Marland Kitchen Fexofenadine HCl (ALLEGRA ALLERGY PO) Take 1 tablet by mouth daily as needed. (Patient not taking: Reported on 06/09/2023)  . gabapentin (NEURONTIN) 100 MG capsule TAKE 2 CAPSULES AT BEDTIME  . hydrochlorothiazide (MICROZIDE) 12.5 MG capsule TAKE 1 CAPSULE EVERY DAY  . levothyroxine (SYNTHROID) 50 MCG tablet TAKE 1 TABLET EVERY DAY  . LORazepam (ATIVAN) 1 MG tablet Takes 1/2 to 1 tablet q day prn  . losartan (COZAAR) 100 MG tablet TAKE 1 TABLET EVERY DAY   No facility-administered encounter medications on file as of 09/12/2023.     Lab Results  Component Value Date   WBC 10.3 05/10/2023   HGB 12.6 (L) 05/10/2023   HCT 37.0 (L) 05/10/2023   PLT 161.0 05/10/2023   GLUCOSE 114 (H) 09/08/2023   CHOL 98 09/08/2023   TRIG  46.0 09/08/2023   HDL 38.20 (L) 09/08/2023   LDLCALC 50 09/08/2023   ALT 12 09/08/2023   AST 20 09/08/2023   NA 137 09/08/2023   K 3.8 09/08/2023   CL 104 09/08/2023   CREATININE 1.25 09/08/2023   BUN 22 09/08/2023   CO2 26 09/08/2023   TSH 3.11 05/10/2023   PSA 4.15 (H) 02/08/2014   INR 1.7 (H) 01/25/2019   HGBA1C 6.3 09/08/2023   MICROALBUR  31.9 (H) 09/06/2022    MR SHOULDER LEFT WO CONTRAST Result Date: 03/22/2021 CLINICAL DATA:  Limited range of motion of the left arm since having shingles. No injury or prior surgery. EXAM: MRI OF THE LEFT SHOULDER WITHOUT CONTRAST TECHNIQUE: Multiplanar, multisequence MR imaging of the shoulder was performed. No intravenous contrast was administered. COMPARISON:  None. FINDINGS: Rotator cuff: Mild supraspinatus tendinosis with low-grade partial-thickness bursal surface tear anteriorly at the insertion. The infraspinatus, teres minor, and subscapularis tendons are intact. Muscles: Mild diffuse muscle edema involving the supraspinatus, infraspinatus, teres minor, and deltoid muscles. Mild diffuse rotator cuff muscle fatty infiltration without frank atrophy. Biceps long head:  Intact and normally positioned. Acromioclavicular Joint: Mild arthropathy of the acromioclavicular joint. Type I acromion. Trace subacromial/subdeltoid bursal fluid. Glenohumeral Joint: Small joint effusion. Extensive full-thickness cartilage loss over the humeral head and glenoid with subchondral marrow edema and cystic change. Labrum:  Diffusely degenerated. Bones: No acute fracture or dislocation. No suspicious bone lesion. Other: None. IMPRESSION: 1. Mild diffuse muscle edema involving the supraspinatus, infraspinatus, teres minor, and deltoid muscles. Findings are suggestive of acute denervation (brachial neuritis). 2. Mild supraspinatus tendinosis with low-grade partial-thickness bursal surface tear anteriorly at the insertion. 3. 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 Electronically Signed   By: Obie Dredge M.D.   On: 03/22/2021 10:45       Assessment & Plan:  Hyperlipidemia LDL goal <70  Type 2 diabetes mellitus with other circulatory complication, without long-term current use of insulin (HCC)     Dale Ames, MD

## 2023-09-16 ENCOUNTER — Encounter: Payer: Self-pay | Admitting: Internal Medicine

## 2023-09-16 DIAGNOSIS — F439 Reaction to severe stress, unspecified: Secondary | ICD-10-CM | POA: Insufficient documentation

## 2023-09-16 NOTE — Assessment & Plan Note (Signed)
 Follow cbc and iron studies.

## 2023-09-16 NOTE — Assessment & Plan Note (Signed)
 Continue coreg, losartan and lipitor.  No chest pain.  Continue risk factor modification. No changes in medication today.

## 2023-09-16 NOTE — Assessment & Plan Note (Signed)
 S/p surgery.  Continue f/u with cardiology for continued monitoring.

## 2023-09-16 NOTE — Assessment & Plan Note (Signed)
 Continue losartan, amlodipine and Coreg.  Amlodipine 5mg  q day.  On hctz.  Blood pressure as outlined.   Follow blood pressure. Follow metabolic panel. No changes in medication today.

## 2023-09-16 NOTE — Assessment & Plan Note (Signed)
 Increased stress as outlined. Discussed. Request refill lorazepam to have if needed. Discussed lorazepam and discussed alternative medication. States does not use regularly and would like to have if needed. Will follow. If increased requirement, will need to adjust/change medication.

## 2023-09-16 NOTE — Assessment & Plan Note (Signed)
 Continue losartan and coreg. Continues on hctz. Continue to monitor salt intake.  Follow metabolic panel.  Breathing stable. No increased sob. Follow. Monitor weight.

## 2023-09-16 NOTE — Assessment & Plan Note (Signed)
 On no medication.   Low carb diet and exercise. Monitor sugars. Follow met b and a1c. Discussed lab results.  Lab Results  Component Value Date   HGBA1C 6.3 09/08/2023

## 2023-09-16 NOTE — Assessment & Plan Note (Signed)
 Avoid antiinflammatories.  Stay hydrated.  Follow metabolic panel.  Continue losartan. On hctz. No changes today.

## 2023-09-16 NOTE — Assessment & Plan Note (Signed)
 Last echo - normal functioning bioprosthetic valve.  Continue losartan and coreg.  Continue f/u with cardiology. Stable.

## 2023-09-16 NOTE — Assessment & Plan Note (Signed)
 On thyroid replacement.  Follow tsh.

## 2023-09-16 NOTE — Assessment & Plan Note (Signed)
 Continue lipitor.  Low cholesterol diet and exercise.  Follow lipid panel and liver function tests.

## 2023-09-28 ENCOUNTER — Other Ambulatory Visit: Payer: Self-pay | Admitting: Internal Medicine

## 2023-10-05 ENCOUNTER — Other Ambulatory Visit: Payer: Self-pay | Admitting: Internal Medicine

## 2023-12-02 ENCOUNTER — Other Ambulatory Visit: Payer: Self-pay | Admitting: Internal Medicine

## 2024-01-06 ENCOUNTER — Other Ambulatory Visit (INDEPENDENT_AMBULATORY_CARE_PROVIDER_SITE_OTHER)

## 2024-01-06 DIAGNOSIS — E1159 Type 2 diabetes mellitus with other circulatory complications: Secondary | ICD-10-CM | POA: Diagnosis not present

## 2024-01-06 DIAGNOSIS — E785 Hyperlipidemia, unspecified: Secondary | ICD-10-CM

## 2024-01-06 LAB — HEMOGLOBIN A1C: Hgb A1c MFr Bld: 6.6 % — ABNORMAL HIGH (ref 4.6–6.5)

## 2024-01-06 LAB — BASIC METABOLIC PANEL WITH GFR
BUN: 23 mg/dL (ref 6–23)
CO2: 25 meq/L (ref 19–32)
Calcium: 9.4 mg/dL (ref 8.4–10.5)
Chloride: 106 meq/L (ref 96–112)
Creatinine, Ser: 1.16 mg/dL (ref 0.40–1.50)
GFR: 55.1 mL/min — ABNORMAL LOW (ref >60.00)
Glucose, Bld: 106 mg/dL — ABNORMAL HIGH (ref 70–99)
Potassium: 3.9 meq/L (ref 3.5–5.1)
Sodium: 139 meq/L (ref 135–145)

## 2024-01-06 LAB — HEPATIC FUNCTION PANEL
ALT: 10 U/L (ref 0–53)
AST: 18 U/L (ref 0–37)
Albumin: 4.4 g/dL (ref 3.5–5.2)
Alkaline Phosphatase: 63 U/L (ref 39–117)
Bilirubin, Direct: 0.2 mg/dL (ref 0.0–0.3)
Total Bilirubin: 0.7 mg/dL (ref 0.2–1.2)
Total Protein: 6.7 g/dL (ref 6.0–8.3)

## 2024-01-06 LAB — LIPID PANEL
Cholesterol: 92 mg/dL (ref 0–200)
HDL: 33.3 mg/dL — ABNORMAL LOW (ref >39.00)
LDL Cholesterol: 47 mg/dL (ref 0–99)
NonHDL: 58.2
Total CHOL/HDL Ratio: 3
Triglycerides: 56 mg/dL (ref 0.0–149.0)
VLDL: 11.2 mg/dL (ref 0.0–40.0)

## 2024-01-08 ENCOUNTER — Ambulatory Visit: Payer: Self-pay | Admitting: Internal Medicine

## 2024-01-12 ENCOUNTER — Ambulatory Visit (INDEPENDENT_AMBULATORY_CARE_PROVIDER_SITE_OTHER): Admitting: Internal Medicine

## 2024-01-12 VITALS — BP 120/68 | HR 72 | Resp 16 | Ht 70.0 in | Wt 226.0 lb

## 2024-01-12 DIAGNOSIS — D649 Anemia, unspecified: Secondary | ICD-10-CM

## 2024-01-12 DIAGNOSIS — E785 Hyperlipidemia, unspecified: Secondary | ICD-10-CM | POA: Diagnosis not present

## 2024-01-12 DIAGNOSIS — E039 Hypothyroidism, unspecified: Secondary | ICD-10-CM

## 2024-01-12 DIAGNOSIS — I5022 Chronic systolic (congestive) heart failure: Secondary | ICD-10-CM

## 2024-01-12 DIAGNOSIS — N1831 Chronic kidney disease, stage 3a: Secondary | ICD-10-CM | POA: Diagnosis not present

## 2024-01-12 DIAGNOSIS — I10 Essential (primary) hypertension: Secondary | ICD-10-CM

## 2024-01-12 DIAGNOSIS — I350 Nonrheumatic aortic (valve) stenosis: Secondary | ICD-10-CM

## 2024-01-12 DIAGNOSIS — I2510 Atherosclerotic heart disease of native coronary artery without angina pectoris: Secondary | ICD-10-CM

## 2024-01-12 DIAGNOSIS — F439 Reaction to severe stress, unspecified: Secondary | ICD-10-CM

## 2024-01-12 DIAGNOSIS — E1159 Type 2 diabetes mellitus with other circulatory complications: Secondary | ICD-10-CM

## 2024-01-12 DIAGNOSIS — I77810 Thoracic aortic ectasia: Secondary | ICD-10-CM

## 2024-01-12 NOTE — Progress Notes (Signed)
 Subjective:    Patient ID: [name] [name], male    DOB: 1931/06/16, 89 y.o.   MRN: [id number]  Patient here for  Chief Complaint  Patient presents with   Medical Management of Chronic Issues    HPI Here for a scheduled follow up - follow up regarding hypercholesterolemia, CAD, diabetes and hypertension. Had f/u with Dr End 06/09/23 - stable. Recommended to continue aspirin  and lipitor . ECHO 10 years after AVR. Discussed labs. Recent A1c 6.6. no chest pain reported. Breathing stable. No abdominal pain or bowel change reported. Wife recently passed. Has good support. Does not feel he needs anything more at this time.    Past Medical History:  Diagnosis Date   Aortic stenosis    Arthritis    right shoulder   BPH (benign prostatic hypertrophy)    Coronary artery disease    Gout    Heart murmur    History of chicken pox    Hypercholesterolemia    Hypertension    Systolic heart failure (HCC)    ischemic cardiomyopathy   Wears dentures    full upper and lower   Past Surgical History:  Procedure Laterality Date   AORTIC VALVE REPLACEMENT N/A 01/25/2019   Procedure: AORTIC VALVE REPLACEMENT (AVR) 25mm Inspiris valve;  Surgeon: Lucas Dorise POUR, MD;  Location: [facility] OR;  Service: Open Heart Surgery;  Laterality: N/A;   arm surgery     right arm fx s/p plate insertion   CARDIAC CATHETERIZATION     CATARACT EXTRACTION W/PHACO Right 11/17/2016   Procedure: CATARACT EXTRACTION PHACO AND INTRAOCULAR LENS PLACEMENT (IOC)  Right;  Surgeon: Mittie Gaskin, MD;  Location: [facility];  Service: Ophthalmology;  Laterality: Right;   CORONARY ARTERY BYPASS GRAFT N/A 01/25/2019   LIMA-LAD and sequential SVG-rPDA-rPL   DENTAL SURGERY     all teeth extracted   RIGHT HEART CATH AND CORONARY ANGIOGRAPHY N/A 01/02/2019   Procedure: RIGHT HEART CATH AND CORONARY ANGIOGRAPHY;  Surgeon: Mady Bruckner, MD;  Location: [facility] INVASIVE CV LAB;  Service: Cardiovascular;  Laterality: N/A;    skin cancer removed left hand  2024   TEE WITHOUT CARDIOVERSION N/A 01/25/2019   Procedure: TRANSESOPHAGEAL ECHOCARDIOGRAM (TEE);  Surgeon: Lucas Dorise POUR, MD;  Location: [facility] OR;  Service: Open Heart Surgery;  Laterality: N/A;   Family History  Problem Relation Age of Onset   Leukemia Father    Congestive Heart Failure Mother    Cancer Daughter        Breast Cancer   Prostate cancer Neg Hx    Colon cancer Neg Hx    Social History   Socioeconomic History   Marital status: Married    Spouse name: Not on file   Number of children: Not on file   Years of education: Not on file   Highest education level: Not on file  Occupational History   Not on file  Tobacco Use   Smoking status: Never   Smokeless tobacco: Current    Types: Chew   Tobacco comments:    not ready to quit currently  Vaping Use   Vaping status: Never Used  Substance and Sexual Activity   Alcohol use: No    Alcohol/week: 0.0 standard drinks of alcohol   Drug use: No   Sexual activity: Not Currently  Other Topics Concern   Not on file  Social History Narrative   Married   Social Drivers of Health   Financial Resource Strain: Low Risk  (05/23/2023)   Overall  Financial Resource Strain (CARDIA)    Difficulty of Paying Living Expenses: Not hard at all  Food Insecurity: No Food Insecurity (05/23/2023)   Hunger Vital Sign    Worried About Running Out of Food in the Last Year: Never true    Ran Out of Food in the Last Year: Never true  Transportation Needs: No Transportation Needs (05/23/2023)   PRAPARE - Administrator, Civil Service (Medical): No    Lack of Transportation (Non-Medical): No  Physical Activity: Inactive (05/23/2023)   Exercise Vital Sign    Days of Exercise per Week: 0 days    Minutes of Exercise per Session: 0 min  Stress: No Stress Concern Present (05/23/2023)   [employer] of Occupational Health - Occupational Stress Questionnaire    Feeling of Stress : Not at all   Social Connections: Moderately Integrated (05/23/2023)   Social Connection and Isolation Panel    Frequency of Communication with Friends and Family: More than three times a week    Frequency of Social Gatherings with Friends and Family: Twice a week    Attends Religious Services: More than 4 times per year    Active Member of [employer] or Organizations: No    Attends Banker Meetings: Never    Marital Status: Married     Review of Systems  Constitutional:  Negative for appetite change and unexpected weight change.  HENT:  Negative for congestion and sinus pressure.   Respiratory:  Negative for cough, chest tightness and shortness of breath.   Cardiovascular:  Negative for chest pain and palpitations.       No increased leg swelling.   Gastrointestinal:  Negative for abdominal pain, diarrhea, nausea and vomiting.  Genitourinary:  Negative for difficulty urinating and dysuria.  Musculoskeletal:  Negative for joint swelling and myalgias.  Skin:  Negative for color change and rash.  Neurological:  Negative for dizziness and headaches.  Psychiatric/Behavioral:  Negative for agitation and dysphoric mood.        Objective:     BP 120/68   Pulse 72   Resp 16   Ht 5' 10 (1.778 m)   Wt 226 lb (102.5 kg)   SpO2 98%   BMI 32.43 kg/m  Wt Readings from Last 3 Encounters:  01/12/24 226 lb (102.5 kg)  09/12/23 241 lb 9.6 oz (109.6 kg)  06/09/23 234 lb 9.6 oz (106.4 kg)    Physical Exam Constitutional:      General: He is not in acute distress.    Appearance: Normal appearance. He is well-developed.  HENT:     Head: Normocephalic and atraumatic.     Right Ear: External ear normal.     Left Ear: External ear normal.     Mouth/Throat:     Pharynx: No oropharyngeal exudate or posterior oropharyngeal erythema.  Eyes:     General: No scleral icterus.       Right eye: No discharge.        Left eye: No discharge.  Cardiovascular:     Rate and Rhythm: Normal rate and  regular rhythm.  Pulmonary:     Effort: Pulmonary effort is normal. No respiratory distress.     Breath sounds: Normal breath sounds.  Abdominal:     General: Bowel sounds are normal.     Palpations: Abdomen is soft.     Tenderness: There is no abdominal tenderness.  Musculoskeletal:        General: No swelling or tenderness.  Cervical back: Neck supple. No tenderness.  Lymphadenopathy:     Cervical: No cervical adenopathy.  Skin:    Findings: No erythema or rash.  Neurological:     Mental Status: He is alert.  Psychiatric:        Mood and Affect: Mood normal.        Behavior: Behavior normal.         Outpatient Encounter Medications as of 01/12/2024  Medication Sig   allopurinol  (ZYLOPRIM ) 100 MG tablet TAKE 1 TABLET EVERY DAY   amLODipine  (NORVASC ) 5 MG tablet TAKE 1 TABLET EVERY DAY   aspirin  EC 81 MG tablet Take 1 tablet (81 mg total) by mouth daily.   atorvastatin  (LIPITOR ) 80 MG tablet TAKE 1 TABLET EVERY MORNING   carvedilol  (COREG ) 6.25 MG tablet TAKE 1 TABLET TWICE DAILY WITH MEALS   cyanocobalamin  1000 MCG tablet Take 1,000 mcg by mouth daily.   gabapentin  (NEURONTIN ) 100 MG capsule TAKE 2 CAPSULES AT BEDTIME   hydrochlorothiazide  (MICROZIDE ) 12.5 MG capsule TAKE 1 CAPSULE EVERY DAY   levothyroxine  (SYNTHROID ) 50 MCG tablet TAKE 1 TABLET EVERY DAY   LORazepam  (ATIVAN ) 1 MG tablet Takes 1/2 tablet q day prn   losartan  (COZAAR ) 100 MG tablet TAKE 1 TABLET EVERY DAY   No facility-administered encounter medications on file as of 01/12/2024.     Lab Results  Component Value Date   WBC 10.3 05/10/2023   HGB 12.6 (L) 05/10/2023   HCT 37.0 (L) 05/10/2023   PLT 161.0 05/10/2023   GLUCOSE 106 (H) 01/06/2024   CHOL 92 01/06/2024   TRIG 56.0 01/06/2024   HDL 33.30 (L) 01/06/2024   LDLCALC 47 01/06/2024   ALT 10 01/06/2024   AST 18 01/06/2024   NA 139 01/06/2024   K 3.9 01/06/2024   CL 106 01/06/2024   CREATININE 1.16 01/06/2024   BUN 23 01/06/2024   CO2 25  01/06/2024   TSH 3.11 05/10/2023   PSA 4.15 (H) 02/08/2014   INR 1.7 (H) 01/25/2019   HGBA1C 6.6 (H) 01/06/2024    MR SHOULDER LEFT WO CONTRAST Result Date: 03/22/2021 CLINICAL DATA:  Limited range of motion of the left arm since having shingles. No injury or prior surgery. EXAM: MRI OF THE LEFT SHOULDER WITHOUT CONTRAST TECHNIQUE: Multiplanar, multisequence MR imaging of the shoulder was performed. No intravenous contrast was administered. COMPARISON:  None. FINDINGS: Rotator cuff: Mild supraspinatus tendinosis with low-grade partial-thickness bursal surface tear anteriorly at the insertion. The infraspinatus, teres minor, and subscapularis tendons are intact. Muscles: Mild diffuse muscle edema involving the supraspinatus, infraspinatus, teres minor, and deltoid muscles. Mild diffuse rotator cuff muscle fatty infiltration without frank atrophy. Biceps long head:  Intact and normally positioned. Acromioclavicular Joint: Mild arthropathy of the acromioclavicular joint. Type I acromion. Trace subacromial/subdeltoid bursal fluid. Glenohumeral Joint: Small joint effusion. Extensive full-thickness cartilage loss over the humeral head and glenoid with subchondral marrow edema and cystic change. Labrum:  Diffusely degenerated. Bones: No acute fracture or dislocation. No suspicious bone lesion. Other: None. IMPRESSION: 1. Mild diffuse muscle edema involving the supraspinatus, infraspinatus, teres minor, and deltoid muscles. Findings are suggestive of acute denervation (brachial neuritis). 2. Mild supraspinatus tendinosis with low-grade partial-thickness bursal surface tear anteriorly at the insertion. 3. 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 Electronically Signed   By: Elsie ONEIDA Shoulder M.D.   On: 03/22/2021 10:45       Assessment & Plan:  Aortic valve stenosis, etiology of cardiac valve disease unspecified Assessment & Plan: Continue f/u with  cardiology. Breathing stable.     Hyperlipidemia LDL goal <70 Assessment & Plan: Continue lipitor .  Low cholesterol diet and exercise.  Follow lipid panel.  Lab Results  Component Value Date   CHOL 92 01/06/2024   HDL 33.30 (L) 01/06/2024   LDLCALC 47 01/06/2024   TRIG 56.0 01/06/2024   CHOLHDL 3 01/06/2024     Orders: -     Lipid panel; Future -     Hepatic function panel; Future -     Basic metabolic panel with GFR; Future  Type 2 diabetes mellitus with other circulatory complication, without long-term current use of insulin  [facility]) Assessment & Plan: On no medication.   Low carb diet and exercise. Monitor sugars. Follow met b and a1c. Discussed recent labs.  Lab Results  Component Value Date   HGBA1C 6.6 (H) 01/06/2024    Orders: -     Hemoglobin A1c; Future  Anemia, unspecified type Assessment & Plan: Follow cbc.   Orders: -     CBC with Differential/Platelet; Future -     TSH; Future  Chronic HFrEF (heart failure with reduced ejection fraction) (HCC) Assessment & Plan: Continue losartan  and coreg . Continues on hctz. Continue to monitor salt intake.  Follow metabolic panel.  Breathing stable. No changes in medication. Follow.    Stage 3a chronic kidney disease (HCC) Assessment & Plan: Avoid antiinflammatories.  Stay hydrated.  Follow metabolic panel.  Continue losartan . On hctz. No changes in medication. Follow.    Coronary artery disease involving native coronary artery of native heart without angina pectoris Assessment & Plan: Continue coreg , losartan  and lipitor .  No chest pain.  Continue risk factor modification. No changes in medication today. Follow.    Dilatation of thoracic aorta [facility]) Assessment & Plan: S/p surgery.  Continue f/u with cardiology for continued monitoring.    Essential hypertension Assessment & Plan: Continue losartan , amlodipine  and Coreg .  Amlodipine  5mg  q day.  On hctz.  Blood pressure as outlined.   Follow blood pressure. Follow metabolic panel. No change  in medication today.    Hypothyroidism, unspecified type Assessment & Plan: On thyroid  replacement. Follow tsh.    Stress Assessment & Plan: Increased stress. Wife recently passed. He has good support. Will notify me if he feels he needs anything more. Follow.       Allena Hamilton, MD

## 2024-01-15 ENCOUNTER — Encounter: Payer: Self-pay | Admitting: Internal Medicine

## 2024-01-15 NOTE — Assessment & Plan Note (Signed)
 Avoid antiinflammatories.  Stay hydrated.  Follow metabolic panel.  Continue losartan . On hctz. No changes in medication. Follow.

## 2024-01-15 NOTE — Assessment & Plan Note (Signed)
 On thyroid replacement.  Follow tsh.

## 2024-01-15 NOTE — Assessment & Plan Note (Signed)
 On no medication.   Low carb diet and exercise. Monitor sugars. Follow met b and a1c. Discussed recent labs.  Lab Results  Component Value Date   HGBA1C 6.6 (H) 01/06/2024

## 2024-01-15 NOTE — Assessment & Plan Note (Signed)
 Continue coreg , losartan  and lipitor .  No chest pain.  Continue risk factor modification. No changes in medication today. Follow.

## 2024-01-15 NOTE — Assessment & Plan Note (Signed)
 Increased stress. Wife recently passed. He has good support. Will notify me if he feels he needs anything more. Follow.

## 2024-01-15 NOTE — Assessment & Plan Note (Signed)
 S/p surgery.  Continue f/u with cardiology for continued monitoring.

## 2024-01-15 NOTE — Assessment & Plan Note (Signed)
 Continue f/u with cardiology. Breathing stable.

## 2024-01-15 NOTE — Assessment & Plan Note (Signed)
 Follow cbc.

## 2024-01-15 NOTE — Assessment & Plan Note (Signed)
 Continue losartan , amlodipine  and Coreg .  Amlodipine  5mg  q day.  On hctz.  Blood pressure as outlined.   Follow blood pressure. Follow metabolic panel. No change in medication today.

## 2024-01-15 NOTE — Assessment & Plan Note (Signed)
 Continue lipitor .  Low cholesterol diet and exercise.  Follow lipid panel.  Lab Results  Component Value Date   CHOL 92 01/06/2024   HDL 33.30 (L) 01/06/2024   LDLCALC 47 01/06/2024   TRIG 56.0 01/06/2024   CHOLHDL 3 01/06/2024

## 2024-01-15 NOTE — Assessment & Plan Note (Signed)
 Continue losartan  and coreg . Continues on hctz. Continue to monitor salt intake.  Follow metabolic panel.  Breathing stable. No changes in medication. Follow.

## 2024-02-01 ENCOUNTER — Encounter: Payer: Self-pay | Admitting: Cardiology

## 2024-02-01 ENCOUNTER — Ambulatory Visit: Attending: Cardiology | Admitting: Cardiology

## 2024-02-01 VITALS — BP 143/75 | HR 61 | Ht 70.0 in | Wt 226.2 lb

## 2024-02-01 DIAGNOSIS — E785 Hyperlipidemia, unspecified: Secondary | ICD-10-CM | POA: Diagnosis not present

## 2024-02-01 DIAGNOSIS — I2510 Atherosclerotic heart disease of native coronary artery without angina pectoris: Secondary | ICD-10-CM

## 2024-02-01 DIAGNOSIS — N1831 Chronic kidney disease, stage 3a: Secondary | ICD-10-CM

## 2024-02-01 DIAGNOSIS — Z951 Presence of aortocoronary bypass graft: Secondary | ICD-10-CM | POA: Diagnosis not present

## 2024-02-01 DIAGNOSIS — Z952 Presence of prosthetic heart valve: Secondary | ICD-10-CM | POA: Diagnosis not present

## 2024-02-01 DIAGNOSIS — I5022 Chronic systolic (congestive) heart failure: Secondary | ICD-10-CM | POA: Diagnosis not present

## 2024-02-01 DIAGNOSIS — I350 Nonrheumatic aortic (valve) stenosis: Secondary | ICD-10-CM

## 2024-02-01 DIAGNOSIS — I255 Ischemic cardiomyopathy: Secondary | ICD-10-CM

## 2024-02-01 DIAGNOSIS — I10 Essential (primary) hypertension: Secondary | ICD-10-CM | POA: Diagnosis not present

## 2024-02-01 MED ORDER — HYDROCHLOROTHIAZIDE 12.5 MG PO CAPS: 12.5000 mg | ORAL_CAPSULE | Freq: Every day | ORAL | 3 refills | Status: AC

## 2024-02-01 MED ORDER — ATORVASTATIN CALCIUM 80 MG PO TABS: 80.0000 mg | ORAL_TABLET | Freq: Every morning | ORAL | 3 refills | Status: AC

## 2024-02-01 NOTE — Progress Notes (Signed)
 Cardiology Office Note   Date:  02/01/2024  ID:  [name] [name], DOB 1930-12-03, MRN [id number] PCP: Glendia Shad, MD  [facility] Providers Cardiologist:  Lonni Hanson, MD     History of Present Illness [name] [name] is a 89 y.o. male with a past medical history of multivessel CAD status post three-vessel CABG (LIMA to LAD, SVG-RPDA, and RPL, 01/2019), HFrEF secondary to NICM complicated by severe aortic stenosis status post bioprosthetic AVR (01/2019), subsequent normalization of LV SF by echo (07/2020), CKD stage III, hypertension, hyperlipidemia, asthma, arthritis, gout, BPH, who presents today for follow-up of his coronary artery disease, ischemic cardiomyopathy, and aortic valve disease.   He had previously been evaluated by [facility] cardiology in 2015 for murmur and underwent nuclear stress testing which was unrevealing per his report.  Following that he was lost to follow-up.  He initially saw his PCP in 11/2018 for scheduled physical but reported he did not feel well.  He was noted he was hypoxic with ambulation back to the exam room with documented O2 saturations of 86-89% on room air with some tightness in his throat and upper chest.  In the setting he was sent to the hospital.  CT of the chest was negative for PE with prominence of the ascending aorta measuring 4.1 x 4.1 cm with aortic atherosclerosis as well as coronary calcification and evidence of pulmonary hypertension.  Echocardiogram completed 11/2018 showed severe hypokinesis of the anterior anterior wall, anterolateral wall, and apical segment with dyskinesis of the basal inferior wall with an EF of 30-35%.  Unable to exclude LV apical thrombus, large pleural effusion in the left lateral region, degenerative mitral valve with moderate mitral valve regurgitation, indeterminate number of aortic valve clips with severe aortic stenosis and a mean gradient 42 mmHg and a valve area of 0.58 cm, moderate aortic valve insufficiency.   He preferred to undergo workup as an outpatient and subsequently underwent R/LHC on 01/02/2019 that showed significant two-vessel CAD, including 80% ostial LAD stenosis and 80 to 90% stenosis of the large RCA continuing, mild to moderate disease noted in the proximal/mid LAD, RCA, and ramus, mildly elevated right heart filling pressures, moderately elevated left heart filling pressures, severe pulmonary hypertension, mildly reduced CO/CI.  He was referred to CVTS for consultation of CABG given two-vessel CAD.  Initially canceled his appointment with cardiothoracic surgery.  He was ultimately evaluated by them and underwent three-vessel CABG and bioprosthetic AVR in 01/2019.  Postoperative echocardiogram in 2/21 showed mildly improved LVSF with an EF of 40 to 45%.  Repeat echo in 07/25/20 showed normal LVSF with an EF of 60 to 65%, no RWMA, G2 DD, moderate LVH, normal LVSF and ventricular cavity size, bioprosthetic static aortic valve with no regurgitation or stenosis with mean gradient 8.5 mmHg pressure 7 mmHg and a borderline dilatation of the aortic root measuring 37 mm.   He was last seen in clinic 06/09/2023 by Dr. Hanson.  At that time he was feeling fairly well.  He notes some fatigue that overall is unchanged from his last visit.  He was tolerating the medications well without difficulty.  EKG revealed sinus rhythm with sinus arrhythmia.  There were no medication changes that were made and no further testing that was ordered at that time.  He returns to clinic today stating that he has been doing well from a cardiac perspective.  He states that the last time he was seen in the clinic his wife has been sick.  He  has not any time to get the new normal of life as he was married for over 70 years.  He states that he occasionally has some fatigue but overall feels well.  Recently had labs done from his primary care provider.  States that he has been compliant with his current medication regimen with no new side  effects.  Denies any hospitalizations or visits to the emergency department.  ROS: 10 point review of system has been reviewed and considered negative with exception was been listed in the HPI  Studies Reviewed EKG Interpretation Date/Time:  Wednesday February 01 2024 14:18:45 EDT Ventricular Rate:  61 PR Interval:  216 QRS Duration:  124 QT Interval:  450 QTC Calculation: 453 R Axis:   -61  Text Interpretation: Sinus rhythm with 1st degree A-V block Left anterior fascicular block Left ventricular hypertrophy with QRS widening and repolarization abnormality ( R in aVL , Cornell product ) When compared with ECG of 09-Jun-2023 08:42, No significant change since last tracing Confirmed by Gerard Frederick ([id number]) on 02/01/2024 2:24:09 PM    07/25/2020: 1. Left ventricular ejection fraction, by estimation, is 60 to 65%. The  left ventricle has normal function. The left ventricle has no regional  wall motion abnormalities. There is moderate left ventricular hypertrophy.  Left ventricular diastolic  parameters are consistent with Grade II diastolic dysfunction  (pseudonormalization).   2. Right ventricular systolic function is normal. The right ventricular  size is normal.   3. Left atrial size was moderately dilated.   4. The mitral valve is normal in structure. Mild mitral valve  regurgitation.   5. Aortic valve regurgitation is not visualized. No aortic stenosis is  present. There is a 25 mm Edwards bovine valve present in the aortic  position. Procedure Date: 01/25/19. Aortic valve area, by VTI measures 1.92  cm. Aortic valve mean gradient  measures 8.5 mmHg.   6. There is borderline dilatation of the aortic root, measuring 37 mm.   2D echo 07/2019:  1. Left ventricular ejection fraction, by visual estimation, is 40 to  45%. The left ventricle has mild to moderately decreased function. There  is no left ventricular hypertrophy.   2. Left ventricular diastolic parameters are consistent with  Grade I  diastolic dysfunction (impaired relaxation).   3. The left ventricle demonstrates regional wall motion abnormalities.   4. Severe hypokinesis is noted in the Mid to apical anterolateral wall of  the left ventricle (clips 63, 64).   5. Global right ventricle has low normal systolic function.The right  ventricular size is normal. Right vetricular wall thickness was not  assessed.   6. Left atrial size was mildly dilated.   7. Right atrial size was normal.   8. The mitral valve is grossly normal. Trivial mitral valve  regurgitation.   9. The tricuspid valve is normal in structure.  10. The tricuspid valve is normal in structure. Tricuspid valve  regurgitation is not demonstrated.  11. Aortic valve mean gradient measures 7.0 mmHg.  12. Aortic valve peak gradient measures 13.8 mmHg.  13. Aortic valve regurgitation is not visualized.  14. There is a normal functioning bioprosthetic aortic valve present. No  rocking or valve dehiscence is noted.  15. The pulmonic valve was not well visualized. Pulmonic valve  regurgitation is not visualized.   Intraoperative TEE 01/2019:  1. Left ventricular ejection fraction, by visual estimation, is 40 to  45%. The left ventricle has mild to moderately decreased function. There  is no left ventricular  hypertrophy.   2. Left ventricular diastolic parameters are consistent with Grade I  diastolic dysfunction (impaired relaxation).   3. The left ventricle demonstrates regional wall motion abnormalities.   4. Severe hypokinesis is noted in the Mid to apical anterolateral wall of  the left ventricle (clips 63, 64).   5. Global right ventricle has low normal systolic function.The right  ventricular size is normal. Right vetricular wall thickness was not  assessed.   6. Left atrial size was mildly dilated.   7. Right atrial size was normal.   8. The mitral valve is grossly normal. Trivial mitral valve  regurgitation.   9. The tricuspid valve is  normal in structure.  10. The tricuspid valve is normal in structure. Tricuspid valve  regurgitation is not demonstrated.  11. Aortic valve mean gradient measures 7.0 mmHg.  12. Aortic valve peak gradient measures 13.8 mmHg.  13. Aortic valve regurgitation is not visualized.  14. There is a normal functioning bioprosthetic aortic valve present. No  rocking or valve dehiscence is noted.  15. The pulmonic valve was not well visualized. Pulmonic valve  regurgitation is not visualized.   Pre-CABG carotid/upper and lower extremity ABIs: Summary:  Right Carotid: Velocities in the right ICA are consistent with a 1-39%  stenosis.   Left Carotid: Velocities in the left ICA are consistent with a 1-39%  stenosis.  Vertebrals:  Bilateral vertebral arteries demonstrate antegrade flow.  Subclavians: Normal flow hemodynamics were seen in bilateral subclavian               arteries.   Right ABI: Resting right ankle-brachial index indicates noncompressible  right lower extremity arteries.The right toe-brachial index is normal.  ABIs are unreliable.  Left ABI: Resting left ankle-brachial index indicates noncompressible left  lower extremity arteries.The left toe-brachial index is normal. ABIs are  unreliable.  Right Upper Extremity: Doppler waveforms remain within normal limits with  right radial compression. Doppler waveforms remain within normal limits  with right ulnar compression.  Left Upper Extremity: Doppler waveforms remain within normal limits with  left radial compression. Doppler waveforms remain within normal limits  with left ulnar compression.   R/LHC 12/2018: Conclusions: Significant two-vessel coronary artery disease, including 80% ostial LAD stenosis and 80-90% stenosis of large RCA continuation. Mild to moderate disease noted the proximal/mid LAD, RCA, and ramus intermedius. Mildly elevated right heart filling pressures. Moderately elevated left heart filling pressures. Severe  pulmonary hypertension. Moderately reduced Fick cardiac output/index.   Recommendations: Outpatient cardiac surgery consultation, given significant two-vessel coronary artery disease (LAD stenosis not ideal for PCI) and severe aortic stenosis. Continue current dose of torsemide ; will plan for BMP early next week and consider escalation of torsemide  +/- addition of low-dose ACEI/ARB. Continue current dose of metoprolol . Aggressive secondary prevention of coronary artery disease. Follow-up in the office in two weeks; may need to consider referral to advanced heart failure clinic in [place] if the patient has recurrent heart failure symptoms or he does not tolerate escalation of diuresis/evidence-based heart failure therapy.   Limited 2D echo 11/2018: 1. The left ventricle has moderately reduced systolic function, with an  ejection fraction of 35-40%. There is mildly increased left ventricular  wall thickness. Left ventricular diastolic Doppler parameters are  consistent with impaired relaxation.   2. The mitral valve was not well visualized. Mitral valve regurgitation  is moderate by color flow Doppler.   3. The tricuspid valve was not well visualized. Tricuspid valve  regurgitation is moderate.   4. The  aortic valve was not well visualized Moderate thickening of the  aortic valve Moderate calcification of the aortic valve. Aortic valve  regurgitation is mild to moderate by color flow Doppler.   2D echo 11/2018: 1. Severe hypokinesis of the left ventricular, entire anterior wall,  anterolateral wall and apical segment.   2. There is dyskinesis of the left ventricular, basal inferior wall.   3. The left ventricle has moderate-severely reduced systolic function,  with an ejection fraction of 30-35%. The cavity size was normal. There is  mildly increased left ventricular wall thickness. Left ventricular  diastolic Doppler parameters are  consistent with restrictive filling. Elevated mean  left atrial pressure.   4. Left ventricular apical thrombus cannot be excluded. Consider limited  echo with Definity  or cardiac MRI for further evaluation, as clinically  indicated.   5. Large pleural effusion in the left lateral region.   6. The mitral valve is degenerative. Mild thickening of the mitral valve  leaflet. Mitral valve regurgitation is moderate by color flow Doppler.   7. The tricuspid valve is grossly normal.   8. The aortic valve has an indeterminate number of cusps. Severely  thickening of the aortic valve. Moderate calcification of the aortic  valve. Aortic valve regurgitation is moderate by color flow Doppler.  Severe stenosis of the aortic valve.   9. The interatrial septum was not well visualized. Risk Assessment/Calculations   HYPERTENSION CONTROL Vitals:   02/01/24 1411 02/01/24 1431  BP: (!) 150/68 (!) 143/75    The patient's blood pressure is elevated above target today.  In order to address the patient's elevated BP: Blood pressure will be monitored at home to determine if medication changes need to be made. (Review of blood pressures with the primary care reveals systolic blood pressures in the 120s)          Physical Exam VS:  BP (!) 143/75 (BP Location: Right Arm, Patient Position: Sitting, Cuff Size: Normal)   Pulse 61   Ht 5' 10 (1.778 m)   Wt 226 lb 3.2 oz (102.6 kg)   SpO2 97%   BMI 32.46 kg/m        Wt Readings from Last 3 Encounters:  02/01/24 226 lb 3.2 oz (102.6 kg)  01/12/24 226 lb (102.5 kg)  09/12/23 241 lb 9.6 oz (109.6 kg)    GEN: Well nourished, well developed in no acute distress NECK: No JVD; No carotid bruits CARDIAC: RRR, no murmurs, rubs, gallops RESPIRATORY:  Clear to auscultation without rales, wheezing or rhonchi  ABDOMEN: Soft, non-tender, non-distended EXTREMITIES:  No edema; No deformity   ASSESSMENT AND PLAN Coronary artery disease status post CABG without anginal or anginal equivalents.  He continues to do  well without recurrent angina status post CABG in 2020.  He is continued on aspirin  and atorvastatin  for secondary prevention.  EKG today reveals sinus rhythm with first-degree block with a rate of 61 with LVH with no significant change from prior studies.  HFrEF secondary to NICM with recovered ejection fraction due to ischemic cardiomyopathy.  He appears to be euvolemic on exam.  He is continued on carvedilol  6.25 mg twice daily, losartan  100 mg daily, and hydrochlorothiazide  12.5 mg daily.  Aortic stenosis status post bioprosthetic AVR in 01/2019.  No symptoms of heart failure.  Continue clinical follow-up with plans for follow-up echocardiogram according to guidelines 10 years after AVR, sooner if symptoms develop.  He has dentures so he does not necessitate SBE.  Hypertension with a blood pressure  today 143/75.  He is continued on amlodipine  5 mg daily, carvedilol  6.25 mg twice daily, HCTZ 12.5 mg daily, and losartan  100 mg daily.  Blood pressure slightly elevated today but previous review of blood pressures at several different office visits blood pressure has been well-controlled 120 systolic.  He has been continued on his current medication regimen today.  He has been encouraged to monitor his pressure 1 to 2 hours postmedication administration as well.  Hyperlipidemia with last LDL 47.  He continues to remain at goal.  He is continued on atorvastatin  80 mg daily.  CKD stage III with his last creatinine of 1.16.  Kidney function is remained stable.  He has been encouraged to continue to avoid NSAIDs.       Dispo: Patient to return to clinic to see MD/APP in 6 months or sooner if needed for further evaluation.  Signed, Karisha Marlin, NP

## 2024-02-01 NOTE — Patient Instructions (Signed)
 Medication Instructions:  Your physician recommends that you continue on your current medications as directed. Please refer to the Current Medication list given to you today.   *If you need a refill on your cardiac medications before your next appointment, please call your pharmacy*  Lab Work: No labs ordered today  If you have labs (blood work) drawn today and your tests are completely normal, you will receive your results only by: MyChart Message (if you have MyChart) OR A paper copy in the mail If you have any lab test that is abnormal or we need to change your treatment, we will call you to review the results.  Testing/Procedures: No test ordered today   Follow-Up: At [facility], you and your health needs are our priority.  As part of our continuing mission to provide you with exceptional heart care, our providers are all part of one team.  This team includes your primary Cardiologist (physician) and Advanced Practice Providers or APPs (Physician Assistants and Nurse Practitioners) who all work together to provide you with the care you need, when you need it.  Your next appointment:   6 month(s)  Provider:   Lonni Hanson, MD or Tylene Lunch, NP    We recommend signing up for the patient portal called MyChart.  Sign up information is provided on this After Visit Summary.  MyChart is used to connect with patients for Virtual Visits (Telemedicine).  Patients are able to view lab/test results, encounter notes, upcoming appointments, etc.  Non-urgent messages can be sent to your provider as well.   To learn more about what you can do with MyChart, go to [email or web address].

## 2024-02-17 ENCOUNTER — Other Ambulatory Visit: Payer: Self-pay | Admitting: Internal Medicine

## 2024-02-17 NOTE — Telephone Encounter (Signed)
 Copied from CRM [id number]. Topic: Clinical - Medication Refill >> Feb 17, 2024  1:50 PM Sasha M wrote: Medication: LORazepam  (ATIVAN ) 1 MG tablet  Has the patient contacted their pharmacy? Yes (Agent: If no, request that the patient contact the pharmacy for the refill. If patient does not wish to contact the pharmacy document the reason why and proceed with request.) (Agent: If yes, when and what did the pharmacy advise?) Pharmacy called in  This is the patient's preferred pharmacy:  [facility] - [place], [place] - [address] [place] [address] Phone: [phone] Fax: [phone]   Is this the correct pharmacy for this prescription? Yes If no, delete pharmacy and type the correct one.   Has the prescription been filled recently? No  Is the patient out of the medication? No  Has the patient been seen for an appointment in the last year OR does the patient have an upcoming appointment? Yes  Can we respond through MyChart? No  Agent: Please be advised that Rx refills may take up to 3 business days. We ask that you follow-up with your pharmacy.

## 2024-02-18 MED ORDER — LORAZEPAM 1 MG PO TABS: ORAL_TABLET | ORAL | 1 refills | Status: AC

## 2024-02-18 NOTE — Telephone Encounter (Signed)
 PDMP reviewed. Rx ok'd for lorazepam  #30 with one refill.

## 2024-04-06 ENCOUNTER — Ambulatory Visit

## 2024-04-06 DIAGNOSIS — Z23 Encounter for immunization: Secondary | ICD-10-CM | POA: Diagnosis not present

## 2024-04-06 NOTE — Progress Notes (Signed)
 Pt received High Dose Flu injection in right deltoid muscle. Pt tolerated it well with no complaints or concerns.

## 2024-05-16 ENCOUNTER — Other Ambulatory Visit

## 2024-05-16 ENCOUNTER — Ambulatory Visit: Payer: Self-pay | Admitting: Internal Medicine

## 2024-05-16 DIAGNOSIS — E1159 Type 2 diabetes mellitus with other circulatory complications: Secondary | ICD-10-CM | POA: Diagnosis not present

## 2024-05-16 DIAGNOSIS — D649 Anemia, unspecified: Secondary | ICD-10-CM

## 2024-05-16 DIAGNOSIS — E785 Hyperlipidemia, unspecified: Secondary | ICD-10-CM

## 2024-05-16 LAB — CBC WITH DIFFERENTIAL/PLATELET
Basophils Absolute: 0.1 K/uL (ref 0.0–0.1)
Basophils Relative: 0.8 % (ref 0.0–3.0)
Eosinophils Absolute: 0.2 K/uL (ref 0.0–0.7)
Eosinophils Relative: 2.8 % (ref 0.0–5.0)
HCT: 37.2 % — ABNORMAL LOW (ref 39.0–52.0)
Hemoglobin: 12.7 g/dL — ABNORMAL LOW (ref 13.0–17.0)
Lymphocytes Relative: 52.9 % — ABNORMAL HIGH (ref 12.0–46.0)
Lymphs Abs: 3.9 K/uL (ref 0.7–4.0)
MCHC: 34.1 g/dL (ref 30.0–36.0)
MCV: 99.5 fl (ref 78.0–100.0)
Monocytes Absolute: 0.7 K/uL (ref 0.1–1.0)
Monocytes Relative: 9.6 % (ref 3.0–12.0)
Neutro Abs: 2.5 K/uL (ref 1.4–7.7)
Neutrophils Relative %: 33.9 % — ABNORMAL LOW (ref 43.0–77.0)
Platelets: 129 K/uL — ABNORMAL LOW (ref 150.0–400.0)
RBC: 3.74 Mil/uL — ABNORMAL LOW (ref 4.22–5.81)
RDW: 13.8 % (ref 11.5–15.5)
WBC: 7.4 K/uL (ref 4.0–10.5)

## 2024-05-16 LAB — HEPATIC FUNCTION PANEL
ALT: 14 U/L (ref 0–53)
AST: 21 U/L (ref 0–37)
Albumin: 4.5 g/dL (ref 3.5–5.2)
Alkaline Phosphatase: 71 U/L (ref 39–117)
Bilirubin, Direct: 0.1 mg/dL (ref 0.0–0.3)
Total Bilirubin: 0.5 mg/dL (ref 0.2–1.2)
Total Protein: 6.7 g/dL (ref 6.0–8.3)

## 2024-05-16 LAB — BASIC METABOLIC PANEL WITH GFR
BUN: 25 mg/dL — ABNORMAL HIGH (ref 6–23)
CO2: 26 meq/L (ref 19–32)
Calcium: 9.2 mg/dL (ref 8.4–10.5)
Chloride: 103 meq/L (ref 96–112)
Creatinine, Ser: 1.39 mg/dL (ref 0.40–1.50)
GFR: 44.24 mL/min — ABNORMAL LOW (ref >60.00)
Glucose, Bld: 95 mg/dL (ref 70–99)
Potassium: 4.2 meq/L (ref 3.5–5.1)
Sodium: 137 meq/L (ref 135–145)

## 2024-05-16 LAB — HEMOGLOBIN A1C: Hgb A1c MFr Bld: 6 % (ref 4.6–6.5)

## 2024-05-16 LAB — LIPID PANEL
Cholesterol: 88 mg/dL (ref 0–200)
HDL: 33.6 mg/dL — ABNORMAL LOW (ref >39.00)
LDL Cholesterol: 44 mg/dL (ref 0–99)
NonHDL: 54.83
Total CHOL/HDL Ratio: 3
Triglycerides: 55 mg/dL (ref 0.0–149.0)
VLDL: 11 mg/dL (ref 0.0–40.0)

## 2024-05-16 LAB — TSH: TSH: 3.01 u[IU]/mL (ref 0.35–5.50)

## 2024-05-23 ENCOUNTER — Telehealth: Payer: Self-pay | Admitting: *Deleted

## 2024-05-23 ENCOUNTER — Ambulatory Visit: Payer: Medicare HMO | Admitting: *Deleted

## 2024-05-23 VITALS — Ht 70.0 in | Wt 226.0 lb

## 2024-05-23 DIAGNOSIS — Z0000 Encounter for general adult medical examination without abnormal findings: Secondary | ICD-10-CM | POA: Diagnosis not present

## 2024-05-23 NOTE — Telephone Encounter (Signed)
 Performed AWV  Patient denies that he has diabetes and stated that he would like it taken off of his chart if possible. Health maintenance shows that he needs a foot exam.  Patient has an appointment scheduled with Dr. Glendia 05/24/24 and will discuss diabetes at that visit.

## 2024-05-23 NOTE — Telephone Encounter (Signed)
Will d/w pt at appt

## 2024-05-23 NOTE — Progress Notes (Signed)
 Chief Complaint  Patient presents with   Medicare Wellness     Subjective:   [name] [name] is a 89 y.o. male who presents for a Medicare Annual Wellness Visit.  Visit info / Clinical Intake: Medicare Wellness Visit Type:: Subsequent Annual Wellness Visit Persons participating in visit and providing information:: patient Medicare Wellness Visit Mode:: Telephone If telephone:: video declined Since this visit was completed virtually, some vitals may be partially provided or unavailable. Missing vitals are due to the limitations of the virtual format.: Unable to obtain vitals - no equipment If Telephone or Video please confirm:: I connected with patient using audio/video enable telemedicine. I verified patient identity with two identifiers, discussed telehealth limitations, and patient agreed to proceed. Patient Location:: Home Provider Location:: Office/Home Interpreter Needed?: No Pre-visit prep was completed: yes AWV questionnaire completed by patient prior to visit?: no Living arrangements:: (!) lives alone Patient's Overall Health Status Rating: good Typical amount of pain: none Does pain affect daily life?: no Are you currently prescribed opioids?: no  Dietary Habits and Nutritional Risks How many meals a day?: 2 Eats fruit and vegetables daily?: (!) no Most meals are obtained by: preparing own meals In the last 2 weeks, have you had any of the following?: none Diabetic:: no (Patient denies that he has diabetes and will discuss with his PCP)  Functional Status Activities of Daily Living (to include ambulation/medication): Independent Ambulation: Independent Medication Administration: Independent Home Management (perform basic housework or laundry): Independent Manage your own finances?: yes Primary transportation is: driving Concerns about vision?: no *vision screening is required for WTM* Concerns about hearing?: no  Fall Screening Falls in the past year?:  0 Number of falls in past year: 0 Was there an injury with Fall?: 0 Fall Risk Category Calculator: 0 Patient Fall Risk Level: Low Fall Risk  Fall Risk Patient at Risk for Falls Due to: No Fall Risks Fall risk Follow up: Falls evaluation completed; Falls prevention discussed  Home and Transportation Safety: All rugs have non-skid backing?: yes All stairs or steps have railings?: yes Grab bars in the bathtub or shower?: yes Have non-skid surface in bathtub or shower?: yes Good home lighting?: yes Regular seat belt use?: yes Hospital stays in the last year:: no  Cognitive Assessment Difficulty concentrating, remembering, or making decisions? : no Will 6CIT or Mini Cog be Completed: yes What year is it?: 0 points What month is it?: 0 points Give patient an address phrase to remember (5 components): [address] [name] About what time is it?: 0 points Count backwards from 20 to 1: 0 points Say the months of the year in reverse: 0 points Repeat the address phrase from earlier: 0 points 6 CIT Score: 0 points  Advance Directives (For Healthcare) Does Patient Have a Medical Advance Directive?: No Would patient like information on creating a medical advance directive?: No - Patient declined  Reviewed/Updated  Reviewed/Updated: Reviewed All (Medical, Surgical, Family, Medications, Allergies, Care Teams, Patient Goals)    Allergies (verified) Patient has no known allergies.   Current Medications (verified) Outpatient Encounter Medications as of 05/23/2024  Medication Sig   allopurinol  (ZYLOPRIM ) 100 MG tablet TAKE 1 TABLET EVERY DAY   amLODipine  (NORVASC ) 5 MG tablet TAKE 1 TABLET EVERY DAY   aspirin  EC 81 MG tablet Take 1 tablet (81 mg total) by mouth daily.   atorvastatin  (LIPITOR ) 80 MG tablet Take 1 tablet (80 mg total) by mouth every morning.   carvedilol  (COREG ) 6.25 MG tablet  TAKE 1 TABLET TWICE DAILY WITH MEALS   cyanocobalamin  1000 MCG tablet Take 1,000 mcg by  mouth daily.   gabapentin  (NEURONTIN ) 100 MG capsule TAKE 2 CAPSULES AT BEDTIME   hydrochlorothiazide  (MICROZIDE ) 12.5 MG capsule Take 1 capsule (12.5 mg total) by mouth daily.   levothyroxine  (SYNTHROID ) 50 MCG tablet TAKE 1 TABLET EVERY DAY   LORazepam  (ATIVAN ) 1 MG tablet Takes 1/2 tablet q day prn   losartan  (COZAAR ) 100 MG tablet TAKE 1 TABLET EVERY DAY   No facility-administered encounter medications on file as of 05/23/2024.    History: Past Medical History:  Diagnosis Date   Aortic stenosis    Arthritis    right shoulder   BPH (benign prostatic hypertrophy)    Coronary artery disease    Gout    Heart murmur    History of chicken pox    Hypercholesterolemia    Hypertension    Systolic heart failure (HCC)    ischemic cardiomyopathy   Wears dentures    full upper and lower   Past Surgical History:  Procedure Laterality Date   AORTIC VALVE REPLACEMENT N/A 01/25/2019   Procedure: AORTIC VALVE REPLACEMENT (AVR) 25mm Inspiris valve;  Surgeon: Lucas Dorise POUR, MD;  Location: [facility] OR;  Service: Open Heart Surgery;  Laterality: N/A;   arm surgery     right arm fx s/p plate insertion   CARDIAC CATHETERIZATION     CATARACT EXTRACTION W/PHACO Right 11/17/2016   Procedure: CATARACT EXTRACTION PHACO AND INTRAOCULAR LENS PLACEMENT (IOC)  Right;  Surgeon: Mittie Gaskin, MD;  Location: [facility];  Service: Ophthalmology;  Laterality: Right;   CORONARY ARTERY BYPASS GRAFT N/A 01/25/2019   LIMA-LAD and sequential SVG-rPDA-rPL   DENTAL SURGERY     all teeth extracted   RIGHT HEART CATH AND CORONARY ANGIOGRAPHY N/A 01/02/2019   Procedure: RIGHT HEART CATH AND CORONARY ANGIOGRAPHY;  Surgeon: Mady Bruckner, MD;  Location: [facility];  Service: Cardiovascular;  Laterality: N/A;   skin cancer removed left hand  2024   TEE WITHOUT CARDIOVERSION N/A 01/25/2019   Procedure: TRANSESOPHAGEAL ECHOCARDIOGRAM (TEE);  Surgeon: Lucas Dorise POUR, MD;  Location: [facility] OR;   Service: Open Heart Surgery;  Laterality: N/A;   Family History  Problem Relation Age of Onset   Leukemia Father    Congestive Heart Failure Mother    Cancer Daughter        Breast Cancer   Prostate cancer Neg Hx    Colon cancer Neg Hx    Social History   Occupational History   Not on file  Tobacco Use   Smoking status: Never   Smokeless tobacco: Current    Types: Chew   Tobacco comments:    not ready to quit currently  Vaping Use   Vaping status: Never Used  Substance and Sexual Activity   Alcohol use: No    Alcohol/week: 0.0 standard drinks of alcohol   Drug use: No   Sexual activity: Not Currently   Tobacco Counseling Ready to quit: Not Answered Counseling given: Not Answered Tobacco comments: not ready to quit currently  SDOH Screenings   Food Insecurity: No Food Insecurity (05/23/2024)  Housing: Low Risk  (05/23/2024)  Transportation Needs: No Transportation Needs (05/23/2024)  Utilities: Not At Risk (05/23/2024)  Alcohol Screen: Low Risk  (05/23/2024)  Depression (PHQ2-9): Low Risk  (05/23/2024)  Financial Resource Strain: Low Risk  (05/23/2024)  Physical Activity: Inactive (05/23/2024)  Social Connections: Moderately Isolated (05/23/2024)  Stress: No Stress Concern Present (  05/23/2024)  Tobacco Use: High Risk (05/23/2024)  Health Literacy: Adequate Health Literacy (05/23/2024)   See flowsheets for full screening details  Depression Screen PHQ 2 & 9 Depression Scale- Over the past 2 weeks, how often have you been bothered by any of the following problems? Little interest or pleasure in doing things: 0 Feeling down, depressed, or hopeless (PHQ Adolescent also includes...irritable): 0 PHQ-2 Total Score: 0 Trouble falling or staying asleep, or sleeping too much: 0 Feeling tired or having little energy: 0 Poor appetite or overeating (PHQ Adolescent also includes...weight loss): 0 Feeling bad about yourself - or that you are a failure or have let yourself or your  family down: 0 Trouble concentrating on things, such as reading the newspaper or watching television (PHQ Adolescent also includes...like school work): 0 Moving or speaking so slowly that other people could have noticed. Or the opposite - being so fidgety or restless that you have been moving around a lot more than usual: 0 Thoughts that you would be better off dead, or of hurting yourself in some way: 0 PHQ-9 Total Score: 0 If you checked off any problems, how difficult have these problems made it for you to do your work, take care of things at home, or get along with other people?: Not difficult at all     Goals Addressed             This Visit's Progress    Patient Stated       Wants to maintain his health and stay active             Objective:    Today's Vitals   05/23/24 1347  Weight: 226 lb (102.5 kg)  Height: 5' 10 (1.778 m)   Body mass index is 32.43 kg/m.  Hearing/Vision screen Hearing Screening - Comments:: No issues Vision Screening - Comments:: Readers, [facility], up to date Immunizations and Health Maintenance Health Maintenance  Topic Date Due   Zoster Vaccines- Shingrix (1 of 2) Never done   COVID-19 Vaccine (4 - 2025-26 season) 02/20/2024   FOOT EXAM  05/12/2024   OPHTHALMOLOGY EXAM  08/29/2024   HEMOGLOBIN A1C  11/13/2024   Medicare Annual Wellness (AWV)  05/23/2025   DTaP/Tdap/Td (2 - Td or Tdap) 03/03/2031   Pneumococcal Vaccine: 50+ Years  Completed   Influenza Vaccine  Completed   Meningococcal B Vaccine  Aged Out        Assessment/Plan:  This is a routine wellness examination for [place].  Patient Care Team: Glendia Shad, MD as PCP - General (Internal Medicine) End, Lonni, MD as PCP - Cardiology (Cardiology)  I have personally reviewed and noted the following in the patient's chart:   Medical and social history Use of alcohol, tobacco or illicit drugs  Current medications and supplements including opioid  prescriptions. Functional ability and status Nutritional status Physical activity Advanced directives List of other physicians Hospitalizations, surgeries, and ER visits in previous 12 months Vitals Screenings to include cognitive, depression, and falls Referrals and appointments  No orders of the defined types were placed in this encounter.  In addition, I have reviewed and discussed with patient certain preventive protocols, quality metrics, and best practice recommendations. A written personalized care plan for preventive services as well as general preventive health recommendations were provided to patient.   Angeline Fredericks, LPN   [id number]   Return in 1 year (on 05/23/2025).  After Visit Summary: (Declined) Due to this being a telephonic visit, with patients personalized plan  was offered to patient but patient Declined AVS at this time   Nurse Notes: Discussed the need to update covid and shingles vaccines. Patient needs a foot exam at upcoming visit.

## 2024-05-23 NOTE — Patient Instructions (Signed)
 [name] [name],  Thank you for taking the time for your Medicare Wellness Visit. I appreciate your continued commitment to your health goals. Please review the care plan we discussed, and feel free to reach out if I can assist you further.  Please note that Annual Wellness Visits do not include a physical exam. Some assessments may be limited, especially if the visit was conducted virtually. If needed, we may recommend an in-person follow-up with your provider.  Ongoing Care Seeing your primary care provider every 3 to 6 months helps us  monitor your health and provide consistent, personalized care.  Consider updating your covid and shingles vaccines.   Referrals If a referral was made during today's visit and you haven't received any updates within two weeks, please contact the referred provider directly to check on the status.  Recommended Screenings:  Health Maintenance  Topic Date Due   Zoster (Shingles) Vaccine (1 of 2) Never done   COVID-19 Vaccine (4 - 2025-26 season) 02/20/2024   Complete foot exam   05/12/2024   Eye exam for diabetics  08/29/2024   Hemoglobin A1C  11/13/2024   Medicare Annual Wellness Visit  05/23/2025   DTaP/Tdap/Td vaccine (2 - Td or Tdap) 03/03/2031   Pneumococcal Vaccine for age over 53  Completed   Flu Shot  Completed   Meningitis B Vaccine  Aged Out       05/23/2024    1:49 PM  Advanced Directives  Does Patient Have a Medical Advance Directive? No  Would patient like information on creating a medical advance directive? No - Patient declined    Vision: Annual vision screenings are recommended for early detection of glaucoma, cataracts, and diabetic retinopathy. These exams can also reveal signs of chronic conditions such as diabetes and high blood pressure.  Dental: Annual dental screenings help detect early signs of oral cancer, gum disease, and other conditions linked to overall health, including heart disease and diabetes.  Please see the attached  documents for additional preventive care recommendations.

## 2024-05-24 ENCOUNTER — Encounter: Payer: Self-pay | Admitting: Internal Medicine

## 2024-05-24 ENCOUNTER — Ambulatory Visit: Admitting: Internal Medicine

## 2024-05-24 VITALS — BP 116/60 | HR 75 | Temp 98.0°F | Ht 70.0 in | Wt 226.0 lb

## 2024-05-24 DIAGNOSIS — E785 Hyperlipidemia, unspecified: Secondary | ICD-10-CM

## 2024-05-24 DIAGNOSIS — D649 Anemia, unspecified: Secondary | ICD-10-CM

## 2024-05-24 DIAGNOSIS — Z0000 Encounter for general adult medical examination without abnormal findings: Secondary | ICD-10-CM

## 2024-05-24 DIAGNOSIS — E1159 Type 2 diabetes mellitus with other circulatory complications: Secondary | ICD-10-CM

## 2024-05-24 LAB — HM DIABETES FOOT EXAM

## 2024-05-24 MED ORDER — ALLOPURINOL 100 MG PO TABS: 100.0000 mg | ORAL_TABLET | Freq: Every day | ORAL | 3 refills | Status: AC

## 2024-05-24 MED ORDER — AMLODIPINE BESYLATE 5 MG PO TABS: 5.0000 mg | ORAL_TABLET | Freq: Every day | ORAL | 3 refills | Status: AC

## 2024-05-24 MED ORDER — LEVOTHYROXINE SODIUM 50 MCG PO TABS: 50.0000 ug | ORAL_TABLET | Freq: Every day | ORAL | 3 refills | Status: AC

## 2024-05-24 MED ORDER — GABAPENTIN 100 MG PO CAPS: 200.0000 mg | ORAL_CAPSULE | Freq: Every day | ORAL | 1 refills | Status: AC

## 2024-05-24 MED ORDER — LOSARTAN POTASSIUM 100 MG PO TABS: 100.0000 mg | ORAL_TABLET | Freq: Every day | ORAL | 3 refills | Status: AC

## 2024-05-24 MED ORDER — CARVEDILOL 6.25 MG PO TABS: 6.2500 mg | ORAL_TABLET | Freq: Two times a day (BID) | ORAL | 3 refills | Status: AC

## 2024-05-24 NOTE — Progress Notes (Unsigned)
 Subjective:    Patient ID: [name] [name], male    DOB: March 13, 1931, 89 y.o.   MRN: [id number]  Patient here for No chief complaint on file.   HPI Here for a physical exam. Saw cardiology 02/01/24 - f/u multivessel CAD s/p three vessel CABG, HFrEF secondary to NICM complicated by severe aortic stenosis status post bioprosthetic AVR (01/2019), subsequent normalization of LV SF by echo (07/2020), CKD stage III, hypertension, hyperlipidemia. Recommended to continue aspirin  and atorvastatin . Also continue carvedilol , losartan  and hydrochlorothiazide .    Past Medical History:  Diagnosis Date   Aortic stenosis    Arthritis    right shoulder   BPH (benign prostatic hypertrophy)    Coronary artery disease    Gout    Heart murmur    History of chicken pox    Hypercholesterolemia    Hypertension    Systolic heart failure (HCC)    ischemic cardiomyopathy   Wears dentures    full upper and lower   Past Surgical History:  Procedure Laterality Date   AORTIC VALVE REPLACEMENT N/A 01/25/2019   Procedure: AORTIC VALVE REPLACEMENT (AVR) 25mm Inspiris valve;  Surgeon: Lucas Dorise POUR, MD;  Location: [facility] OR;  Service: Open Heart Surgery;  Laterality: N/A;   arm surgery     right arm fx s/p plate insertion   CARDIAC CATHETERIZATION     CATARACT EXTRACTION W/PHACO Right 11/17/2016   Procedure: CATARACT EXTRACTION PHACO AND INTRAOCULAR LENS PLACEMENT (IOC)  Right;  Surgeon: Mittie Gaskin, MD;  Location: [facility];  Service: Ophthalmology;  Laterality: Right;   CORONARY ARTERY BYPASS GRAFT N/A 01/25/2019   LIMA-LAD and sequential SVG-rPDA-rPL   DENTAL SURGERY     all teeth extracted   RIGHT HEART CATH AND CORONARY ANGIOGRAPHY N/A 01/02/2019   Procedure: RIGHT HEART CATH AND CORONARY ANGIOGRAPHY;  Surgeon: Mady Bruckner, MD;  Location: [facility];  Service: Cardiovascular;  Laterality: N/A;   skin cancer removed left hand  2024   TEE WITHOUT CARDIOVERSION N/A  01/25/2019   Procedure: TRANSESOPHAGEAL ECHOCARDIOGRAM (TEE);  Surgeon: Lucas Dorise POUR, MD;  Location: [facility] OR;  Service: Open Heart Surgery;  Laterality: N/A;   Family History  Problem Relation Age of Onset   Leukemia Father    Congestive Heart Failure Mother    Cancer Daughter        Breast Cancer   Prostate cancer Neg Hx    Colon cancer Neg Hx    Social History   Socioeconomic History   Marital status: Married    Spouse name: Not on file   Number of children: Not on file   Years of education: Not on file   Highest education level: Not on file  Occupational History   Not on file  Tobacco Use   Smoking status: Never   Smokeless tobacco: Current    Types: Chew   Tobacco comments:    not ready to quit currently  Vaping Use   Vaping status: Never Used  Substance and Sexual Activity   Alcohol use: No    Alcohol/week: 0.0 standard drinks of alcohol   Drug use: No   Sexual activity: Not Currently  Other Topics Concern   Not on file  Social History Narrative   Married   Social Drivers of Health   Financial Resource Strain: Low Risk  (05/23/2024)   Overall Financial Resource Strain (CARDIA)    Difficulty of Paying Living Expenses: Not hard at all  Food Insecurity: No Food Insecurity (05/23/2024)  Hunger Vital Sign    Worried About Running Out of Food in the Last Year: Never true    Ran Out of Food in the Last Year: Never true  Transportation Needs: No Transportation Needs (05/23/2024)   PRAPARE - Administrator, Civil Service (Medical): No    Lack of Transportation (Non-Medical): No  Physical Activity: Inactive (05/23/2024)   Exercise Vital Sign    Days of Exercise per Week: 0 days    Minutes of Exercise per Session: 0 min  Stress: No Stress Concern Present (05/23/2024)   [employer] of Occupational Health - Occupational Stress Questionnaire    Feeling of Stress: Not at all  Social Connections: Moderately Isolated (05/23/2024)   Social Connection and  Isolation Panel    Frequency of Communication with Friends and Family: More than three times a week    Frequency of Social Gatherings with Friends and Family: Three times a week    Attends Religious Services: More than 4 times per year    Active Member of Clubs or Organizations: No    Attends Banker Meetings: Never    Marital Status: Widowed     Review of Systems     Objective:     There were no vitals taken for this visit. Wt Readings from Last 3 Encounters:  05/23/24 226 lb (102.5 kg)  02/01/24 226 lb 3.2 oz (102.6 kg)  01/12/24 226 lb (102.5 kg)    Physical Exam  {Perform Simple Foot Exam  Perform Detailed exam:1} {Insert foot Exam (Optional):30965}   Outpatient Encounter Medications as of 05/24/2024  Medication Sig   allopurinol  (ZYLOPRIM ) 100 MG tablet TAKE 1 TABLET EVERY DAY   amLODipine  (NORVASC ) 5 MG tablet TAKE 1 TABLET EVERY DAY   aspirin  EC 81 MG tablet Take 1 tablet (81 mg total) by mouth daily.   atorvastatin  (LIPITOR ) 80 MG tablet Take 1 tablet (80 mg total) by mouth every morning.   carvedilol  (COREG ) 6.25 MG tablet TAKE 1 TABLET TWICE DAILY WITH MEALS   cyanocobalamin  1000 MCG tablet Take 1,000 mcg by mouth daily.   gabapentin  (NEURONTIN ) 100 MG capsule TAKE 2 CAPSULES AT BEDTIME   hydrochlorothiazide  (MICROZIDE ) 12.5 MG capsule Take 1 capsule (12.5 mg total) by mouth daily.   levothyroxine  (SYNTHROID ) 50 MCG tablet TAKE 1 TABLET EVERY DAY   LORazepam  (ATIVAN ) 1 MG tablet Takes 1/2 tablet q day prn   losartan  (COZAAR ) 100 MG tablet TAKE 1 TABLET EVERY DAY   No facility-administered encounter medications on file as of 05/24/2024.     Lab Results  Component Value Date   WBC 7.4 05/16/2024   HGB 12.7 (L) 05/16/2024   HCT 37.2 (L) 05/16/2024   PLT 129.0 (L) 05/16/2024   GLUCOSE 95 05/16/2024   CHOL 88 05/16/2024   TRIG 55.0 05/16/2024   HDL 33.60 (L) 05/16/2024   LDLCALC 44 05/16/2024   ALT 14 05/16/2024   AST 21 05/16/2024   NA 137  05/16/2024   K 4.2 05/16/2024   CL 103 05/16/2024   CREATININE 1.39 05/16/2024   BUN 25 (H) 05/16/2024   CO2 26 05/16/2024   TSH 3.01 05/16/2024   PSA 4.15 (H) 02/08/2014   INR 1.7 (H) 01/25/2019   HGBA1C 6.0 05/16/2024    MR SHOULDER LEFT WO CONTRAST Result Date: 03/22/2021 CLINICAL DATA:  Limited range of motion of the left arm since having shingles. No injury or prior surgery. EXAM: MRI OF THE LEFT SHOULDER WITHOUT CONTRAST TECHNIQUE: Multiplanar, multisequence  MR imaging of the shoulder was performed. No intravenous contrast was administered. COMPARISON:  None. FINDINGS: Rotator cuff: Mild supraspinatus tendinosis with low-grade partial-thickness bursal surface tear anteriorly at the insertion. The infraspinatus, teres minor, and subscapularis tendons are intact. Muscles: Mild diffuse muscle edema involving the supraspinatus, infraspinatus, teres minor, and deltoid muscles. Mild diffuse rotator cuff muscle fatty infiltration without frank atrophy. Biceps long head:  Intact and normally positioned. Acromioclavicular Joint: Mild arthropathy of the acromioclavicular joint. Type I acromion. Trace subacromial/subdeltoid bursal fluid. Glenohumeral Joint: Small joint effusion. Extensive full-thickness cartilage loss over the humeral head and glenoid with subchondral marrow edema and cystic change. Labrum:  Diffusely degenerated. Bones: No acute fracture or dislocation. No suspicious bone lesion. Other: None. IMPRESSION: 1. Mild diffuse muscle edema involving the supraspinatus, infraspinatus, teres minor, and deltoid muscles. Findings are suggestive of acute denervation (brachial neuritis). 2. Mild supraspinatus tendinosis with low-grade partial-thickness bursal surface tear anteriorly at the insertion. 3. 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 Electronically Signed   By: Elsie ONEIDA Shoulder M.D.   On: 03/22/2021 10:45       Assessment & Plan:  Health care  maintenance  Hyperlipidemia LDL goal <70  Type 2 diabetes mellitus with other circulatory complication, without long-term current use of insulin  (HCC)  Anemia, unspecified type     Allena Hamilton, MD

## 2024-05-24 NOTE — Assessment & Plan Note (Signed)
 Physical today 05/24/24.  Colonoscopy 2012 - internal hemorrhoids.  No f/u recommended.  Declined prostate check and psa (previously)

## 2024-05-24 NOTE — Patient Instructions (Signed)
Nasacort nasal spray - 2 sprays each nostril one time per day.   

## 2024-06-03 ENCOUNTER — Encounter: Payer: Self-pay | Admitting: Internal Medicine

## 2024-06-03 DIAGNOSIS — D696 Thrombocytopenia, unspecified: Secondary | ICD-10-CM | POA: Insufficient documentation

## 2024-06-03 NOTE — Assessment & Plan Note (Signed)
 Continue losartan  and coreg . Continues on hctz. Continue to monitor salt intake.  Follow metabolic panel.  Breathing stable. No change in medication today.

## 2024-06-03 NOTE — Assessment & Plan Note (Signed)
 Decrreased platelet count - recent check. Recheck cbc soon to confirm stable.

## 2024-06-03 NOTE — Assessment & Plan Note (Signed)
 Histofy of severe aortic stenosis status post bioprosthetic AVR (01/2019), subsequent normalization of LV SF by echo (07/2020). Continue carvedilol , losartan  and hydrochlorothiazide . Overall doing well.

## 2024-06-03 NOTE — Assessment & Plan Note (Signed)
 Hgb stable 12.7. follow cbc.

## 2024-06-03 NOTE — Assessment & Plan Note (Signed)
 On thyroid replacement.  Follow tsh.

## 2024-06-03 NOTE — Assessment & Plan Note (Signed)
 Avoid antiinflammatories.  Stay hydrated.  Follow metabolic panel.  Continue losartan . On hctz. No changes in medication. Recent GFR decreased when compared to previous. Recheck met b to confirm stable/improved.

## 2024-06-03 NOTE — Assessment & Plan Note (Signed)
 S/p surgery.  Continue f/u with cardiology for continued monitoring.

## 2024-06-03 NOTE — Assessment & Plan Note (Signed)
 Overall handling things well.  Follow.

## 2024-06-03 NOTE — Assessment & Plan Note (Signed)
 On no medication.   Low carb diet and exercise. Monitor sugars. Follow met b and a1c. Discussed recent labs.   Lab Results  Component Value Date   HGBA1C 6.0 05/16/2024

## 2024-06-03 NOTE — Assessment & Plan Note (Signed)
 No recent flare. Remains on allopurinol .

## 2024-06-03 NOTE — Assessment & Plan Note (Signed)
 Continue lipitor .  Low cholesterol diet and exercise.  Follow lipid panel.  Lab Results  Component Value Date   CHOL 88 05/16/2024   HDL 33.60 (L) 05/16/2024   LDLCALC 44 05/16/2024   TRIG 55.0 05/16/2024   CHOLHDL 3 05/16/2024

## 2024-06-03 NOTE — Assessment & Plan Note (Signed)
 Continue losartan , amlodipine  and Coreg .  Amlodipine  5mg  q day.  On hctz.  Blood pressure as outlined.   Follow blood pressure. Follow metabolic panel. No change in medication today. GFR decreased from last check. Stay hydrated. Plan recheck met b.

## 2024-06-03 NOTE — Assessment & Plan Note (Signed)
 Continue coreg , losartan  and lipitor .  No chest pain.  Continue risk factor modification. No changes in medication today. Follow.

## 2024-06-18 ENCOUNTER — Other Ambulatory Visit (INDEPENDENT_AMBULATORY_CARE_PROVIDER_SITE_OTHER)

## 2024-06-18 DIAGNOSIS — N1831 Chronic kidney disease, stage 3a: Secondary | ICD-10-CM | POA: Diagnosis not present

## 2024-06-18 DIAGNOSIS — D649 Anemia, unspecified: Secondary | ICD-10-CM | POA: Diagnosis not present

## 2024-06-19 LAB — BASIC METABOLIC PANEL WITH GFR
BUN: 31 mg/dL — ABNORMAL HIGH (ref 6–23)
CO2: 25 meq/L (ref 19–32)
Calcium: 9.4 mg/dL (ref 8.4–10.5)
Chloride: 105 meq/L (ref 96–112)
Creatinine, Ser: 1.56 mg/dL — ABNORMAL HIGH (ref 0.40–1.50)
GFR: 38.49 mL/min — ABNORMAL LOW
Glucose, Bld: 111 mg/dL — ABNORMAL HIGH (ref 70–99)
Potassium: 4.5 meq/L (ref 3.5–5.1)
Sodium: 140 meq/L (ref 135–145)

## 2024-06-19 LAB — CBC WITH DIFFERENTIAL/PLATELET
Basophils Absolute: 0.1 K/uL (ref 0.0–0.1)
Basophils Relative: 1.1 % (ref 0.0–3.0)
Eosinophils Absolute: 0.3 K/uL (ref 0.0–0.7)
Eosinophils Relative: 1.9 % (ref 0.0–5.0)
HCT: 36.9 % — ABNORMAL LOW (ref 39.0–52.0)
Hemoglobin: 12.5 g/dL — ABNORMAL LOW (ref 13.0–17.0)
Lymphocytes Relative: 56.6 % — ABNORMAL HIGH (ref 12.0–46.0)
Lymphs Abs: 7.6 K/uL — ABNORMAL HIGH (ref 0.7–4.0)
MCHC: 33.9 g/dL (ref 30.0–36.0)
MCV: 98.7 fl (ref 78.0–100.0)
Monocytes Absolute: 0.6 K/uL (ref 0.1–1.0)
Monocytes Relative: 4.6 % (ref 3.0–12.0)
Neutro Abs: 4.8 K/uL (ref 1.4–7.7)
Neutrophils Relative %: 35.8 % — ABNORMAL LOW (ref 43.0–77.0)
Platelets: 196 K/uL (ref 150.0–400.0)
RBC: 3.74 Mil/uL — ABNORMAL LOW (ref 4.22–5.81)
RDW: 13.8 % (ref 11.5–15.5)
WBC: 13.5 K/uL — ABNORMAL HIGH (ref 4.0–10.5)

## 2024-06-20 ENCOUNTER — Ambulatory Visit: Payer: Self-pay | Admitting: Internal Medicine

## 2024-06-20 DIAGNOSIS — N1831 Chronic kidney disease, stage 3a: Secondary | ICD-10-CM

## 2024-06-20 DIAGNOSIS — D649 Anemia, unspecified: Secondary | ICD-10-CM

## 2024-06-24 ENCOUNTER — Other Ambulatory Visit: Payer: Self-pay | Admitting: Internal Medicine

## 2024-06-24 DIAGNOSIS — R944 Abnormal results of kidney function studies: Secondary | ICD-10-CM

## 2024-06-24 DIAGNOSIS — N1831 Chronic kidney disease, stage 3a: Secondary | ICD-10-CM

## 2024-06-24 NOTE — Progress Notes (Signed)
 Order placed for renal ultrasound.

## 2024-06-26 ENCOUNTER — Ambulatory Visit
Admission: RE | Admit: 2024-06-26 | Discharge: 2024-06-26 | Disposition: A | Discharge status: Home / Self Care | Source: Ambulatory Visit | Attending: Internal Medicine | Admitting: Internal Medicine

## 2024-06-26 DIAGNOSIS — R944 Abnormal results of kidney function studies: Secondary | ICD-10-CM | POA: Diagnosis present

## 2024-06-26 DIAGNOSIS — N1831 Chronic kidney disease, stage 3a: Secondary | ICD-10-CM | POA: Diagnosis present

## 2024-07-02 ENCOUNTER — Other Ambulatory Visit

## 2024-07-02 DIAGNOSIS — D649 Anemia, unspecified: Secondary | ICD-10-CM

## 2024-07-02 DIAGNOSIS — N1831 Chronic kidney disease, stage 3a: Secondary | ICD-10-CM | POA: Diagnosis not present

## 2024-07-03 LAB — BASIC METABOLIC PANEL WITH GFR
BUN: 28 mg/dL — ABNORMAL HIGH (ref 6–23)
CO2: 27 meq/L (ref 19–32)
Calcium: 9.8 mg/dL (ref 8.4–10.5)
Chloride: 104 meq/L (ref 96–112)
Creatinine, Ser: 1.44 mg/dL (ref 0.40–1.50)
GFR: 42.36 mL/min — ABNORMAL LOW
Glucose, Bld: 112 mg/dL — ABNORMAL HIGH (ref 70–99)
Potassium: 4.5 meq/L (ref 3.5–5.1)
Sodium: 140 meq/L (ref 135–145)

## 2024-07-03 LAB — CBC WITH DIFFERENTIAL/PLATELET
Basophils Absolute: 0.1 K/uL (ref 0.0–0.1)
Basophils Relative: 0.9 % (ref 0.0–3.0)
Eosinophils Absolute: 0.3 K/uL (ref 0.0–0.7)
Eosinophils Relative: 1.7 % (ref 0.0–5.0)
HCT: 38.1 % — ABNORMAL LOW (ref 39.0–52.0)
Hemoglobin: 12.8 g/dL — ABNORMAL LOW (ref 13.0–17.0)
Lymphocytes Relative: 56.1 % — ABNORMAL HIGH (ref 12.0–46.0)
Lymphs Abs: 8.4 K/uL — ABNORMAL HIGH (ref 0.7–4.0)
MCHC: 33.5 g/dL (ref 30.0–36.0)
MCV: 99.5 fl (ref 78.0–100.0)
Monocytes Absolute: 0.6 K/uL (ref 0.1–1.0)
Monocytes Relative: 4.1 % (ref 3.0–12.0)
Neutro Abs: 5.6 K/uL (ref 1.4–7.7)
Neutrophils Relative %: 37.2 % — ABNORMAL LOW (ref 43.0–77.0)
Platelets: 154 K/uL (ref 150.0–400.0)
RBC: 3.83 Mil/uL — ABNORMAL LOW (ref 4.22–5.81)
RDW: 14 % (ref 11.5–15.5)
WBC: 14.9 K/uL — ABNORMAL HIGH (ref 4.0–10.5)

## 2024-07-07 ENCOUNTER — Ambulatory Visit: Payer: Self-pay | Admitting: Internal Medicine

## 2024-07-07 ENCOUNTER — Other Ambulatory Visit: Payer: Self-pay | Admitting: Internal Medicine

## 2024-07-07 DIAGNOSIS — D72829 Elevated white blood cell count, unspecified: Secondary | ICD-10-CM

## 2024-07-07 NOTE — Progress Notes (Signed)
Order placed for f/u cbc.   

## 2024-08-20 ENCOUNTER — Other Ambulatory Visit

## 2024-09-25 ENCOUNTER — Other Ambulatory Visit

## 2024-09-27 ENCOUNTER — Ambulatory Visit: Admitting: Internal Medicine

## 2025-05-29 ENCOUNTER — Ambulatory Visit
